# Patient Record
Sex: Female | Born: 1940 | Race: White | Hispanic: No | Marital: Married | Smoking: Never smoker
Health system: Southern US, Community
[De-identification: ages and names within clinical notes are randomized; demographics above are authoritative.]

## PROBLEM LIST (undated history)

## (undated) DIAGNOSIS — I4891 Unspecified atrial fibrillation: Secondary | ICD-10-CM

## (undated) DIAGNOSIS — I341 Nonrheumatic mitral (valve) prolapse: Secondary | ICD-10-CM

## (undated) DIAGNOSIS — R002 Palpitations: Secondary | ICD-10-CM

## (undated) DIAGNOSIS — L659 Nonscarring hair loss, unspecified: Secondary | ICD-10-CM

## (undated) DIAGNOSIS — R569 Unspecified convulsions: Secondary | ICD-10-CM

## (undated) DIAGNOSIS — D649 Anemia, unspecified: Secondary | ICD-10-CM

## (undated) DIAGNOSIS — Z951 Presence of aortocoronary bypass graft: Secondary | ICD-10-CM

## (undated) DIAGNOSIS — T8149XA Infection following a procedure, other surgical site, initial encounter: Secondary | ICD-10-CM

## (undated) DIAGNOSIS — I10 Essential (primary) hypertension: Secondary | ICD-10-CM

## (undated) DIAGNOSIS — I219 Acute myocardial infarction, unspecified: Secondary | ICD-10-CM

## (undated) DIAGNOSIS — I2510 Atherosclerotic heart disease of native coronary artery without angina pectoris: Secondary | ICD-10-CM

## (undated) DIAGNOSIS — K5190 Ulcerative colitis, unspecified, without complications: Secondary | ICD-10-CM

## (undated) DIAGNOSIS — M1990 Unspecified osteoarthritis, unspecified site: Secondary | ICD-10-CM

## (undated) DIAGNOSIS — M26629 Arthralgia of temporomandibular joint, unspecified side: Secondary | ICD-10-CM

## (undated) DIAGNOSIS — A0472 Enterocolitis due to Clostridium difficile, not specified as recurrent: Secondary | ICD-10-CM

## (undated) DIAGNOSIS — K219 Gastro-esophageal reflux disease without esophagitis: Secondary | ICD-10-CM

## (undated) DIAGNOSIS — Z8719 Personal history of other diseases of the digestive system: Secondary | ICD-10-CM

## (undated) DIAGNOSIS — I319 Disease of pericardium, unspecified: Secondary | ICD-10-CM

## (undated) DIAGNOSIS — Z8709 Personal history of other diseases of the respiratory system: Secondary | ICD-10-CM

## (undated) DIAGNOSIS — R490 Dysphonia: Secondary | ICD-10-CM

## (undated) DIAGNOSIS — E785 Hyperlipidemia, unspecified: Secondary | ICD-10-CM

## (undated) DIAGNOSIS — Z933 Colostomy status: Secondary | ICD-10-CM

## (undated) DIAGNOSIS — N189 Chronic kidney disease, unspecified: Secondary | ICD-10-CM

## (undated) HISTORY — PX: TUBAL LIGATION: SHX77

## (undated) HISTORY — DX: Atherosclerotic heart disease of native coronary artery without angina pectoris: I25.10

## (undated) HISTORY — PX: HEMORRHOID SURGERY: SHX153

## (undated) HISTORY — DX: Unspecified atrial fibrillation: I48.91

## (undated) HISTORY — DX: Hyperlipidemia, unspecified: E78.5

## (undated) HISTORY — DX: Arthralgia of temporomandibular joint, unspecified side: M26.629

## (undated) HISTORY — DX: Disease of pericardium, unspecified: I31.9

## (undated) HISTORY — PX: COLONOSCOPY: SHX174

## (undated) HISTORY — PX: CATARACT EXTRACTION, BILATERAL: SHX1313

## (undated) HISTORY — DX: Infection following a procedure, other surgical site, initial encounter: T81.49XA

## (undated) HISTORY — DX: Palpitations: R00.2

## (undated) HISTORY — DX: Presence of aortocoronary bypass graft: Z95.1

## (undated) HISTORY — PX: ABDOMINAL HYSTERECTOMY: SHX81

## (undated) HISTORY — DX: Enterocolitis due to Clostridium difficile, not specified as recurrent: A04.72

## (undated) HISTORY — PX: CORONARY ANGIOPLASTY WITH STENT PLACEMENT: SHX49

## (undated) HISTORY — DX: Acute myocardial infarction, unspecified: I21.9

## (undated) HISTORY — PX: APPENDECTOMY: SHX54

## (undated) HISTORY — DX: Colostomy status: Z93.3

## (undated) HISTORY — PX: ESOPHAGOGASTRODUODENOSCOPY: SHX1529

## (undated) HISTORY — DX: Gastro-esophageal reflux disease without esophagitis: K21.9

## (undated) HISTORY — PX: OTHER SURGICAL HISTORY: SHX169

## (undated) HISTORY — DX: Essential (primary) hypertension: I10

## (undated) HISTORY — DX: Ulcerative colitis, unspecified, without complications: K51.90

## (undated) HISTORY — DX: Nonscarring hair loss, unspecified: L65.9

---

## 1978-05-15 DIAGNOSIS — I319 Disease of pericardium, unspecified: Secondary | ICD-10-CM

## 1978-05-15 HISTORY — DX: Disease of pericardium, unspecified: I31.9

## 2001-05-15 DIAGNOSIS — R002 Palpitations: Secondary | ICD-10-CM

## 2001-05-15 HISTORY — DX: Palpitations: R00.2

## 2002-04-18 ENCOUNTER — Encounter: Payer: Self-pay | Admitting: Cardiology

## 2002-05-27 ENCOUNTER — Encounter: Payer: Self-pay | Admitting: Cardiology

## 2003-05-16 HISTORY — PX: OOPHORECTOMY: SHX86

## 2003-06-02 ENCOUNTER — Encounter: Payer: Self-pay | Admitting: Cardiology

## 2006-07-06 ENCOUNTER — Encounter: Payer: Self-pay | Admitting: Cardiology

## 2006-07-09 ENCOUNTER — Encounter: Payer: Self-pay | Admitting: Cardiology

## 2009-08-02 ENCOUNTER — Ambulatory Visit: Payer: Self-pay | Admitting: Cardiology

## 2009-08-02 DIAGNOSIS — R079 Chest pain, unspecified: Secondary | ICD-10-CM

## 2009-08-02 DIAGNOSIS — E785 Hyperlipidemia, unspecified: Secondary | ICD-10-CM

## 2009-08-02 DIAGNOSIS — I491 Atrial premature depolarization: Secondary | ICD-10-CM

## 2009-08-16 ENCOUNTER — Telehealth: Payer: Self-pay | Admitting: Cardiology

## 2009-08-24 ENCOUNTER — Telehealth (INDEPENDENT_AMBULATORY_CARE_PROVIDER_SITE_OTHER): Payer: Self-pay | Admitting: *Deleted

## 2009-08-25 ENCOUNTER — Ambulatory Visit: Payer: Self-pay | Admitting: Cardiovascular Disease

## 2009-08-25 ENCOUNTER — Encounter (HOSPITAL_COMMUNITY): Admission: RE | Admit: 2009-08-25 | Discharge: 2009-10-14 | Payer: Self-pay | Admitting: Cardiology

## 2009-08-25 ENCOUNTER — Ambulatory Visit: Payer: Self-pay

## 2009-08-25 ENCOUNTER — Ambulatory Visit (HOSPITAL_COMMUNITY): Admission: RE | Admit: 2009-08-25 | Discharge: 2009-08-25 | Payer: Self-pay | Admitting: Cardiovascular Disease

## 2009-08-25 ENCOUNTER — Encounter: Payer: Self-pay | Admitting: Cardiology

## 2010-06-14 NOTE — Progress Notes (Signed)
Summary: should pt keep or cancel appt   Phone Note Call from Patient Call back at Home Phone [phone]   Caller: Patient Reason for Call: Talk to Nurse Summary of Call: pt has a compression fx in her back, echo, stress test on 4/13. should pt cancel or keep.  Initial call taken by: Neil Crouch,  August 16, 2009 4:09 PM  Follow-up for Phone Call        Called patient back..she has informed me that she has a probable compression fracture shown on xray and is waiting for an MRI appointment to confirm. She is scheduled for a stress echo on 4/13 and is concerned. I advised her that having an echo on 4/13 is no problem but she would not be able to walk on the treadmill. Advised her we would discuss this issue with Dr.McLean on 4/5 and call her back. She statea that it is OK to leave message on her voice mail at home.  Follow-up by: J REISS RN     Appended Document: should pt keep or cancel appt  Change myoview to [place].   Appended Document: should pt keep or cancel appt  pt scheduled for stress myoview and echo----pt unable to walk on treadmill due to probable compression fracture in her back-reviewed with Dr Vassie Moment recommended change stress myoview to [employer] discussed with pt and will cancel order for stress myoview and order lexiscan myoview   Clinical Lists Changes  Orders: Added new Referral order of Nuclear Stress Test (Nuc Stress Test) - Signed

## 2010-06-14 NOTE — Consult Note (Signed)
Summary: [facility]   Imported By: Marilynne Drivers 08/20/2009 13:52:29  _____________________________________________________________________  External Attachment:    Type:   Image     Comment:   External Document

## 2010-06-14 NOTE — Progress Notes (Signed)
Summary: Nuclear Pre-Procedure  Phone Note Outgoing Call Call back at [facility] Phone [phone]   Call placed by: Eliezer Lofts, EMT-P,  August 24, 2009 2:26 PM Call placed to: Patient Action Taken: Phone Call Completed Summary of Call: Reviewed information on Myoview Information Sheet (see scanned document for further details).  Spoke with Patient.    Nuclear Med Background Indications for Stress Test: Evaluation for Ischemia, Abnormal EKG   History: Echo, Heart Catheterization, Myocardial Perfusion Study  History Comments: '04 Heart Cath: N/O CAD 11/04 MPS: EF=67%, NL '08 Echo: EF-60-65%  Symptoms: Chest Pain, DOE    Nuclear Pre-Procedure Cardiac Risk Factors: Family History - CAD, Hypertension, Lipids Height (in): 66.5

## 2010-06-14 NOTE — Procedures (Signed)
Summary: [facility]  [facility]   Imported By: Marilynne Drivers 08/20/2009 16:53:50  _____________________________________________________________________  External Attachment:    Type:   Image     Comment:   External Document

## 2010-06-14 NOTE — Consult Note (Signed)
Summary: [facility]  [facility]   Imported By: Marilynne Drivers 08/20/2009 16:43:00  _____________________________________________________________________  External Attachment:    Type:   Image     Comment:   External Document

## 2010-06-14 NOTE — Consult Note (Signed)
Summary: [facility]   Imported By: Marilynne Drivers 08/20/2009 16:49:43  _____________________________________________________________________  External Attachment:    Type:   Image     Comment:   External Document

## 2010-06-14 NOTE — Assessment & Plan Note (Signed)
Summary: Cardiology Nuclear Study  Nuclear Med Background Indications for Stress Test: Evaluation for Ischemia, Abnormal EKG   History: Echo, Heart Catheterization, Myocardial Perfusion Study  History Comments: '04 Heart Cath: N/O CAD 11/04 MPS: EF=67%, NL '08 Echo: EF-60-65%  Symptoms: Chest Pain, DOE    Nuclear Pre-Procedure Cardiac Risk Factors: Family History - CAD, Hypertension, Lipids Caffeine/Decaff Intake: None NPO After: 5:30 PM Lungs: clear IV 0.9% NS with Angio Cath: 20g     IV Site: (L) AC IV Started by: Irven Baltimore RN Chest Size (in) 36     Cup Size D     Height (in): 66.5 Weight (lb): 148 BMI: 23.62  Nuclear Med Study 1 or 2 day study:  1 day     Stress Test Type:  Carlton Adam Reading MD:  Jenkins Rouge, MD     Referring MD:  D.Mclean Resting Radionuclide:  Technetium 60mTetrofosmin     Resting Radionuclide Dose:  11 mCi  Stress Radionuclide:  Technetium 952metrofosmin     Stress Radionuclide Dose:  33 mCi   Stress Protocol   Lexiscan: 0.4 mg   Stress Test Technologist:  SaPerrin MalteseMT-P     Nuclear Technologist:  StCharlton AmorNMT  Rest Procedure  Myocardial perfusion imaging was performed at rest 45 minutes following the intravenous administration of Myoview Technetium 9966mtrofosmin.  Stress Procedure  The patient received IV Lexiscan 0.4 mg over 15-seconds.  Myoview injected at 30-seconds.  There were no significant changes with infusion.  Quantitative spect images were obtained after a 45 minute delay.  QPS Raw Data Images:  Normal; no motion artifact; normal heart/lung ratio. Stress Images:  NI: Uniform and normal uptake of tracer in all myocardial segments. Rest Images:  Normal homogeneous uptake in all areas of the myocardium. Subtraction (SDS):  Normal Transient Ischemic Dilatation:  .97  (Normal <1.22)  Lung/Heart Ratio:  .32  (Normal <0.45)  Quantitative Gated Spect Images QGS EDV:  66 ml QGS ESV:  18 ml QGS EF:  73 % QGS  cine images:  normal  Findings Normal nuclear study      Overall Impression  Exercise Capacity: lexiscan BP Response: Normal blood pressure response. Clinical Symptoms: No chest pain ECG Impression: No significant ST segment change suggestive of ischemia. Overall Impression: Normal stress nuclear study.  Appended Document: Cardiology Nuclear Study normal study  Appended Document: Cardiology Nuclear Study discussed results with patient by telephone

## 2010-06-14 NOTE — Assessment & Plan Note (Signed)
Summary: NP6/ABN EKG/BIGEMY/JML/ PER JEN/JML   Primary Provider:  Tacey Ruiz, MD  CC:  new patient abnormal ekg.  [place].  Pt states she went in to Dr. Olevia Perches office for a pain in her back and all of this was unexpected.  Marland Kitchen  History of Present Illness: 70 yo with history of mild mitral regurgitation and aortic insufficiency, cath with mild CAD in 2004, HTN, and palpitations presents for evaluation of left-sided chest and back pain as well as abnormal ECG at her PCP's office this am.  Patient went to see her PCP because of pain underneath her left shoulder and radiating to underneath her left breast.  The pain is sharp and lasts 5-10 minutes at a time.  It is not associated with exertion and usually happens at rest.  It has been daily starting 3 wks ago, occurring on and off all day since then.  She works out a [employer] without any chest pain.  She gets a little short of breath walking up a hill but no chest pain.  No shortness of breath walking on flat ground.    ECG: Sinus rhythm with atrial bigeminy (PACs)   Labs (3/11): K 3.9, creatinine 0.8  Current Medications (verified): 1)  Lisinopril 10 Mg Tabs (Lisinopril) .... Take One Tablet Once Daily 2)  Simvastatin 40 Mg Tabs (Simvastatin) .... Take One Tablet Once Daily 3)  Nexium 40 Mg Cpdr (Esomeprazole Magnesium) .... Once Daily 4)  Aspirin 81 Mg Tabs (Aspirin) .... Take One Tablet Once Daily 5)  Multivitamins  Tabs (Multiple Vitamin) .... Take One Tablet Once Daily 6)  Calcium-Vitamin D 600-200 Mg-Unit Tabs (Calcium-Vitamin D) .... Take One Tablet Once Daily 7)  Vitamin C 1000 Mg Tabs (Ascorbic Acid) .... Once Daily 8)  Tylenol Extra Strength 500 Mg Tabs (Acetaminophen) .... Take One Tablet Once Daily  Allergies (verified): 1)  ! Corgard 2)  ! Verapamil 3)  ! Sulfa 4)  ! * Shellfish  Past History:  Past Medical History: 1. Hyperlipidemia 2. Pericarditis in the 1980s 3. GERD 4. Valvular disease: Last echo (2/08) with EF  60-65%, trace MR, mild-moderate TR.  Prior echoes had shown mild AI and mild to moderate MR.   5.  Left heart cath in 2004 for atypical chest pain: 40% mid LAD stenosis only.  Right heart cath at the same time showed mean RA pressure 5 mmHg, PA 36/9, mean PCWP 9 mmHg.  Last myoview in 11/04 showed Ef 67%, normal perfusion images.  6.  GERD 7.  HTN 8.  Holter in 2003 showed PACs and PVCs  Family History: Father with MI at 61, mother with CABG in her 66s  Social History: Moved from [place] to [place] to be near her 2 children.  Married.  Retired.  Never smoked.  Rare ETOH.   Review of Systems       All systems reviewed and negative except as per HPI.   Vital Signs:  Patient profile:   70 year old female Height:      66.5 inches Weight:      151 pounds BMI:     24.09 Pulse rate:   76 / minute Pulse rhythm:   regular BP sitting:   132 / 66  (left arm) Cuff size:   large  Vitals Entered By: Doug Sou CMA (August 02, 2009 4:29 PM)  Physical Exam  General:  Well developed, well nourished, in no acute distress. Head:  normocephalic and atraumatic Nose:  no deformity, discharge, inflammation, or  lesions Mouth:  Teeth, gums and palate normal. Oral mucosa normal. Neck:  Neck supple, no JVD. No masses, thyromegaly or abnormal cervical nodes. Lungs:  Clear bilaterally to auscultation and percussion. Heart:  Non-displaced PMI, chest non-tender; regular rate and rhythm, S1, S2 without rubs or gallops. 2/6 HSM at [place].  Carotid upstroke normal, no bruit. Pedals normal pulses. No edema, no varicosities. Abdomen:  Bowel sounds positive; abdomen soft and non-tender without masses, organomegaly, or hernias noted. No hepatosplenomegaly. Msk:  Tender under left scapula Extremities:  No clubbing or cyanosis. Neurologic:  Alert and oriented x 3. Skin:  Intact without lesions or rashes. Psych:  Normal affect.   Impression & Recommendations:  Problem # 1:  CHEST PAIN UNSPECIFIED  (ICD-786.50) Atypical chest pain radiating from left upper back.  This may be musculoskeletal given the tenderness to palpation in left upper back.  Given the persistence of the pain and her known CAD (40% LAD stenosis in 2004), I will have her do an ETT-myoview to rule out ischemia and risk stratify.    Problem # 2:  VALVULAR DISEASE Mild to moderate MR, mild to moderate TR, and mild aortic insufficiency at various times on past echoes.  Exam is suggestive of a TR murmur.  No evidence on exam for volume overload.  Will get an echo to assess valvular function.    Problem # 3:  ATRIAL PREMATURE BEATS (ICD-427.61) Patient's ECG showed atrial bigeminy (atrial premature beats). This is likely benign.  Will assess structure of heart with echo as above.  She drinks 2-3 cups of coffee daily, I have asked her to cut back.    Problem # 4:  HYPERLIPIDEMIA-MIXED (TDH-741.4) Given known mild CAD, would aim to keep LDL ideally < 70, at least < 100.    Other Orders: Echocardiogram (Echo) Nuclear Stress Test (Nuc Stress Test)  Patient Instructions: 1)  Your physician has requested that you have an echocardiogram.  Echocardiography is a painless test that uses sound waves to create images of your heart. It provides your doctor with information about the size and shape of your heart and how well your heart's chambers and valves are working.  This procedure takes approximately one hour. There are no restrictions for this procedure. 2)  Your physician has requested that you have an exercise stress myoview.  For further information please visit [email or web address].  Please follow instruction sheet, as given. 3)  Your physician recommends that you schedule a follow-up appointment with Dr Aundra Dubin  after testing is completed in 3-4 weeks

## 2010-11-13 DIAGNOSIS — I219 Acute myocardial infarction, unspecified: Secondary | ICD-10-CM

## 2010-11-13 HISTORY — DX: Acute myocardial infarction, unspecified: I21.9

## 2010-11-21 ENCOUNTER — Inpatient Hospital Stay (HOSPITAL_COMMUNITY)
Admission: EM | Admit: 2010-11-21 | Discharge: 2010-12-07 | DRG: 234 | Disposition: A | Discharge status: Home Health | Payer: Medicare Other | Source: Other Acute Inpatient Hospital | Attending: Thoracic Surgery (Cardiothoracic Vascular Surgery) | Admitting: Thoracic Surgery (Cardiothoracic Vascular Surgery)

## 2010-11-21 DIAGNOSIS — I6529 Occlusion and stenosis of unspecified carotid artery: Secondary | ICD-10-CM | POA: Diagnosis present

## 2010-11-21 DIAGNOSIS — Z7901 Long term (current) use of anticoagulants: Secondary | ICD-10-CM

## 2010-11-21 DIAGNOSIS — K219 Gastro-esophageal reflux disease without esophagitis: Secondary | ICD-10-CM | POA: Diagnosis present

## 2010-11-21 DIAGNOSIS — A0472 Enterocolitis due to Clostridium difficile, not specified as recurrent: Secondary | ICD-10-CM | POA: Diagnosis not present

## 2010-11-21 DIAGNOSIS — I10 Essential (primary) hypertension: Secondary | ICD-10-CM | POA: Diagnosis present

## 2010-11-21 DIAGNOSIS — E871 Hypo-osmolality and hyponatremia: Secondary | ICD-10-CM | POA: Diagnosis not present

## 2010-11-21 DIAGNOSIS — N390 Urinary tract infection, site not specified: Secondary | ICD-10-CM | POA: Diagnosis present

## 2010-11-21 DIAGNOSIS — E876 Hypokalemia: Secondary | ICD-10-CM | POA: Diagnosis not present

## 2010-11-21 DIAGNOSIS — Z7982 Long term (current) use of aspirin: Secondary | ICD-10-CM

## 2010-11-21 DIAGNOSIS — I200 Unstable angina: Secondary | ICD-10-CM

## 2010-11-21 DIAGNOSIS — Z91013 Allergy to seafood: Secondary | ICD-10-CM

## 2010-11-21 DIAGNOSIS — I519 Heart disease, unspecified: Secondary | ICD-10-CM | POA: Diagnosis not present

## 2010-11-21 DIAGNOSIS — Z888 Allergy status to other drugs, medicaments and biological substances status: Secondary | ICD-10-CM

## 2010-11-21 DIAGNOSIS — Z882 Allergy status to sulfonamides status: Secondary | ICD-10-CM

## 2010-11-21 DIAGNOSIS — I214 Non-ST elevation (NSTEMI) myocardial infarction: Principal | ICD-10-CM | POA: Diagnosis present

## 2010-11-21 DIAGNOSIS — Y9210 Unspecified residential institution as the place of occurrence of the external cause: Secondary | ICD-10-CM | POA: Diagnosis not present

## 2010-11-21 DIAGNOSIS — I2510 Atherosclerotic heart disease of native coronary artery without angina pectoris: Secondary | ICD-10-CM | POA: Diagnosis present

## 2010-11-21 DIAGNOSIS — Z01810 Encounter for preprocedural cardiovascular examination: Secondary | ICD-10-CM

## 2010-11-21 DIAGNOSIS — R32 Unspecified urinary incontinence: Secondary | ICD-10-CM | POA: Diagnosis not present

## 2010-11-21 DIAGNOSIS — D62 Acute posthemorrhagic anemia: Secondary | ICD-10-CM | POA: Diagnosis not present

## 2010-11-21 DIAGNOSIS — Z8249 Family history of ischemic heart disease and other diseases of the circulatory system: Secondary | ICD-10-CM

## 2010-11-21 DIAGNOSIS — E785 Hyperlipidemia, unspecified: Secondary | ICD-10-CM | POA: Diagnosis present

## 2010-11-21 DIAGNOSIS — I4891 Unspecified atrial fibrillation: Secondary | ICD-10-CM | POA: Diagnosis not present

## 2010-11-21 DIAGNOSIS — E8779 Other fluid overload: Secondary | ICD-10-CM | POA: Diagnosis not present

## 2010-11-21 DIAGNOSIS — Y832 Surgical operation with anastomosis, bypass or graft as the cause of abnormal reaction of the patient, or of later complication, without mention of misadventure at the time of the procedure: Secondary | ICD-10-CM | POA: Diagnosis not present

## 2010-11-21 DIAGNOSIS — Z79899 Other long term (current) drug therapy: Secondary | ICD-10-CM

## 2010-11-21 DIAGNOSIS — D696 Thrombocytopenia, unspecified: Secondary | ICD-10-CM | POA: Diagnosis not present

## 2010-11-21 HISTORY — PX: CARDIAC CATHETERIZATION: SHX172

## 2010-11-21 LAB — COMPREHENSIVE METABOLIC PANEL
BUN: 17 mg/dL (ref 6–23)
CO2: 27 mEq/L (ref 19–32)
Calcium: 8.6 mg/dL (ref 8.4–10.5)
Chloride: 106 mEq/L (ref 96–112)
Creatinine, Ser: 0.64 mg/dL (ref 0.50–1.10)
GFR calc Af Amer: >60 mL/min (ref >60)
GFR calc non Af Amer: >60 mL/min (ref >60)
Total Bilirubin: 0.2 mg/dL — ABNORMAL LOW (ref 0.3–1.2)

## 2010-11-21 LAB — PRO B NATRIURETIC PEPTIDE: Pro B Natriuretic peptide (BNP): 716.5 pg/mL — ABNORMAL HIGH (ref 0–125)

## 2010-11-21 LAB — CBC
HCT: 36.1 % (ref 36.0–46.0)
MCH: 28.8 pg (ref 26.0–34.0)
MCV: 86.8 fL (ref 78.0–100.0)
Platelets: 152 10*3/uL (ref 150–400)
RBC: 4.16 MIL/uL (ref 3.87–5.11)
WBC: 6.9 10*3/uL (ref 4.0–10.5)

## 2010-11-21 LAB — CARDIAC PANEL(CRET KIN+CKTOT+MB+TROPI)
CK, MB: 6.7 ng/mL (ref 0.3–4.0)
CK, MB: 10.5 ng/mL (ref 0.3–4.0)
Total CK: 143 U/L (ref 7–177)

## 2010-11-22 DIAGNOSIS — I369 Nonrheumatic tricuspid valve disorder, unspecified: Secondary | ICD-10-CM

## 2010-11-22 DIAGNOSIS — I2510 Atherosclerotic heart disease of native coronary artery without angina pectoris: Secondary | ICD-10-CM

## 2010-11-22 DIAGNOSIS — I214 Non-ST elevation (NSTEMI) myocardial infarction: Secondary | ICD-10-CM

## 2010-11-22 LAB — BASIC METABOLIC PANEL
BUN: 17 mg/dL (ref 6–23)
Calcium: 9 mg/dL (ref 8.4–10.5)
Chloride: 102 mEq/L (ref 96–112)
Creatinine, Ser: 0.78 mg/dL (ref 0.50–1.10)
GFR calc Af Amer: >60 mL/min (ref >60)
GFR calc non Af Amer: >60 mL/min (ref >60)

## 2010-11-22 LAB — URINALYSIS, ROUTINE W REFLEX MICROSCOPIC
Glucose, UA: NEGATIVE mg/dL
Hgb urine dipstick: NEGATIVE
Ketones, ur: NEGATIVE mg/dL
Protein, ur: NEGATIVE mg/dL
Urobilinogen, UA: 0.2 mg/dL (ref 0.0–1.0)

## 2010-11-22 LAB — HEPARIN LEVEL (UNFRACTIONATED)
Heparin Unfractionated: 0.35 IU/mL (ref 0.30–0.70)
Heparin Unfractionated: 0.22 IU/mL — ABNORMAL LOW (ref 0.30–0.70)

## 2010-11-22 LAB — POCT I-STAT 3, ART BLOOD GAS (G3+)
O2 Saturation: 80 %
Patient temperature: 98.5
TCO2: 30 mmol/L (ref 0–100)
pH, Arterial: 7.456 — ABNORMAL HIGH (ref 7.350–7.400)

## 2010-11-22 LAB — CBC
HCT: 36.2 % (ref 36.0–46.0)
MCH: 29 pg (ref 26.0–34.0)
MCHC: 32.9 g/dL (ref 30.0–36.0)
MCV: 88.1 fL (ref 78.0–100.0)
Platelets: 128 10*3/uL — ABNORMAL LOW (ref 150–400)
RDW: 14.2 % (ref 11.5–15.5)

## 2010-11-22 LAB — D-DIMER, QUANTITATIVE: D-Dimer, Quant: 0.29 ug/mL-FEU (ref 0.00–0.48)

## 2010-11-22 LAB — PROTIME-INR
INR: 1.07 (ref 0.00–1.49)
Prothrombin Time: 14.1 seconds (ref 11.6–15.2)

## 2010-11-22 LAB — HEMOGLOBIN A1C: Hgb A1c MFr Bld: 6.3 % — ABNORMAL HIGH (ref <5.7)

## 2010-11-22 LAB — URINE MICROSCOPIC-ADD ON

## 2010-11-22 LAB — LIPID PANEL: Total CHOL/HDL Ratio: 3.4 RATIO
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
## 2010-11-23 ENCOUNTER — Inpatient Hospital Stay (HOSPITAL_COMMUNITY): Payer: Medicare Other

## 2010-11-23 LAB — COMPREHENSIVE METABOLIC PANEL
ALT: 15 U/L (ref 0–35)
BUN: 16 mg/dL (ref 6–23)
CO2: 30 mEq/L (ref 19–32)
Calcium: 9.2 mg/dL (ref 8.4–10.5)
Creatinine, Ser: 0.82 mg/dL (ref 0.50–1.10)
GFR calc Af Amer: >60 mL/min (ref >60)
GFR calc non Af Amer: >60 mL/min (ref >60)
Glucose, Bld: 112 mg/dL — ABNORMAL HIGH (ref 70–99)

## 2010-11-23 LAB — HEPARIN LEVEL (UNFRACTIONATED): Heparin Unfractionated: 0.46 IU/mL (ref 0.30–0.70)

## 2010-11-23 LAB — TYPE AND SCREEN: ABO/RH(D): A POS

## 2010-11-23 LAB — CBC
MCHC: 32.7 g/dL (ref 30.0–36.0)
Platelets: 129 10*3/uL — ABNORMAL LOW (ref 150–400)
RDW: 14 % (ref 11.5–15.5)

## 2010-11-24 LAB — BASIC METABOLIC PANEL
CO2: 31 mEq/L (ref 19–32)
Chloride: 100 mEq/L (ref 96–112)
GFR calc Af Amer: >60 mL/min (ref >60)
Potassium: 3.5 mEq/L (ref 3.5–5.1)
Sodium: 139 mEq/L (ref 135–145)

## 2010-11-24 LAB — BLOOD GAS, ARTERIAL
O2 Saturation: 99.4 %
Patient temperature: 98.6
pH, Arterial: 7.565 — ABNORMAL HIGH (ref 7.350–7.400)

## 2010-11-24 LAB — HEPARIN LEVEL (UNFRACTIONATED): Heparin Unfractionated: 0.27 IU/mL — ABNORMAL LOW (ref 0.30–0.70)

## 2010-11-24 LAB — SURGICAL PCR SCREEN
MRSA, PCR: NEGATIVE
Staphylococcus aureus: NEGATIVE

## 2010-11-24 LAB — CBC
MCH: 28.4 pg (ref 26.0–34.0)
Platelets: 148 10*3/uL — ABNORMAL LOW (ref 150–400)
RBC: 4.51 MIL/uL (ref 3.87–5.11)
WBC: 5.3 10*3/uL (ref 4.0–10.5)

## 2010-11-25 ENCOUNTER — Other Ambulatory Visit: Payer: Self-pay | Admitting: Thoracic Surgery (Cardiothoracic Vascular Surgery)

## 2010-11-25 ENCOUNTER — Inpatient Hospital Stay (HOSPITAL_COMMUNITY): Payer: Medicare Other

## 2010-11-25 DIAGNOSIS — Z951 Presence of aortocoronary bypass graft: Secondary | ICD-10-CM | POA: Insufficient documentation

## 2010-11-25 DIAGNOSIS — I2510 Atherosclerotic heart disease of native coronary artery without angina pectoris: Secondary | ICD-10-CM

## 2010-11-25 HISTORY — DX: Presence of aortocoronary bypass graft: Z95.1

## 2010-11-25 HISTORY — PX: CORONARY ARTERY BYPASS GRAFT: SHX141

## 2010-11-25 LAB — POCT I-STAT, CHEM 8
HCT: 26 % — ABNORMAL LOW (ref 36.0–46.0)
Hemoglobin: 8.8 g/dL — ABNORMAL LOW (ref 12.0–15.0)
Sodium: 141 mEq/L (ref 135–145)
TCO2: 25 mmol/L (ref 0–100)

## 2010-11-25 LAB — CBC
HCT: 28.7 % — ABNORMAL LOW (ref 36.0–46.0)
HCT: 26.8 % — ABNORMAL LOW (ref 36.0–46.0)
Hemoglobin: 13.2 g/dL (ref 12.0–15.0)
Hemoglobin: 9.7 g/dL — ABNORMAL LOW (ref 12.0–15.0)
MCH: 28.8 pg (ref 26.0–34.0)
MCH: 28.8 pg (ref 26.0–34.0)
MCH: 28.9 pg (ref 26.0–34.0)
MCHC: 33.4 g/dL (ref 30.0–36.0)
MCHC: 33.8 g/dL (ref 30.0–36.0)
MCV: 86.2 fL (ref 78.0–100.0)
MCV: 86.2 fL (ref 78.0–100.0)
Platelets: 161 10*3/uL (ref 150–400)
Platelets: 75 10*3/uL — ABNORMAL LOW (ref 150–400)
RBC: 4.58 MIL/uL (ref 3.87–5.11)
RBC: 3.11 MIL/uL — ABNORMAL LOW (ref 3.87–5.11)
RDW: 13.7 % (ref 11.5–15.5)
WBC: 5.7 10*3/uL (ref 4.0–10.5)

## 2010-11-25 LAB — POCT I-STAT 4, (NA,K, GLUC, HGB,HCT)
Glucose, Bld: 115 mg/dL — ABNORMAL HIGH (ref 70–99)
Glucose, Bld: 120 mg/dL — ABNORMAL HIGH (ref 70–99)
Glucose, Bld: 139 mg/dL — ABNORMAL HIGH (ref 70–99)
HCT: 34 % — ABNORMAL LOW (ref 36.0–46.0)
HCT: 25 % — ABNORMAL LOW (ref 36.0–46.0)
HCT: 26 % — ABNORMAL LOW (ref 36.0–46.0)
HCT: 29 % — ABNORMAL LOW (ref 36.0–46.0)
Hemoglobin: 11.6 g/dL — ABNORMAL LOW (ref 12.0–15.0)
Hemoglobin: 8.5 g/dL — ABNORMAL LOW (ref 12.0–15.0)
Hemoglobin: 8.8 g/dL — ABNORMAL LOW (ref 12.0–15.0)
Hemoglobin: 9.2 g/dL — ABNORMAL LOW (ref 12.0–15.0)
Potassium: 3.6 mEq/L (ref 3.5–5.1)
Potassium: 4.5 mEq/L (ref 3.5–5.1)
Potassium: 3.7 mEq/L (ref 3.5–5.1)
Potassium: 3.1 mEq/L — ABNORMAL LOW (ref 3.5–5.1)
Potassium: 4.3 mEq/L (ref 3.5–5.1)
Sodium: 136 mEq/L (ref 135–145)
Sodium: 139 mEq/L (ref 135–145)
Sodium: 144 mEq/L (ref 135–145)

## 2010-11-25 LAB — POCT I-STAT 3, ART BLOOD GAS (G3+)
Acid-Base Excess: 4 mmol/L — ABNORMAL HIGH (ref 0.0–2.0)
Bicarbonate: 27.5 mEq/L — ABNORMAL HIGH (ref 20.0–24.0)
O2 Saturation: 100 %
O2 Saturation: 99 %
Patient temperature: 35.7
Patient temperature: 37
TCO2: 28 mmol/L (ref 0–100)
pCO2 arterial: 31.8 mmHg — ABNORMAL LOW (ref 35.0–45.0)
pH, Arterial: 7.545 — ABNORMAL HIGH (ref 7.350–7.400)
pH, Arterial: 7.338 — ABNORMAL LOW (ref 7.350–7.400)
pO2, Arterial: 322 mmHg — ABNORMAL HIGH (ref 80.0–100.0)
pO2, Arterial: 110 mmHg — ABNORMAL HIGH (ref 80.0–100.0)

## 2010-11-25 LAB — GLUCOSE, CAPILLARY
Glucose-Capillary: 120 mg/dL — ABNORMAL HIGH (ref 70–99)
Glucose-Capillary: 98 mg/dL (ref 70–99)
Glucose-Capillary: 113 mg/dL — ABNORMAL HIGH (ref 70–99)
Glucose-Capillary: 144 mg/dL — ABNORMAL HIGH (ref 70–99)

## 2010-11-25 LAB — POCT I-STAT GLUCOSE
Glucose, Bld: 139 mg/dL — ABNORMAL HIGH (ref 70–99)
Operator id: 235871

## 2010-11-25 LAB — BASIC METABOLIC PANEL
CO2: 31 mEq/L (ref 19–32)
Calcium: 9.7 mg/dL (ref 8.4–10.5)
Creatinine, Ser: 0.87 mg/dL (ref 0.50–1.10)
GFR calc non Af Amer: >60 mL/min (ref >60)
Glucose, Bld: 113 mg/dL — ABNORMAL HIGH (ref 70–99)
Sodium: 136 mEq/L (ref 135–145)

## 2010-11-25 LAB — MAGNESIUM: Magnesium: 2.9 mg/dL — ABNORMAL HIGH (ref 1.5–2.5)

## 2010-11-25 LAB — PROTIME-INR: Prothrombin Time: 17.9 seconds — ABNORMAL HIGH (ref 11.6–15.2)

## 2010-11-25 LAB — HEMOGLOBIN AND HEMATOCRIT, BLOOD: HCT: 26 % — ABNORMAL LOW (ref 36.0–46.0)

## 2010-11-25 LAB — CREATININE, SERUM: GFR calc Af Amer: >60 mL/min (ref >60)

## 2010-11-26 ENCOUNTER — Inpatient Hospital Stay (HOSPITAL_COMMUNITY): Payer: Medicare Other

## 2010-11-26 LAB — CBC
HCT: 29.7 % — ABNORMAL LOW (ref 36.0–46.0)
MCH: 28.4 pg (ref 26.0–34.0)
MCV: 87.4 fL (ref 78.0–100.0)
MCV: 88.7 fL (ref 78.0–100.0)
Platelets: 75 10*3/uL — ABNORMAL LOW (ref 150–400)
RBC: 3.18 MIL/uL — ABNORMAL LOW (ref 3.87–5.11)
RBC: 3.35 MIL/uL — ABNORMAL LOW (ref 3.87–5.11)
RDW: 14.1 % (ref 11.5–15.5)
RDW: 14.2 % (ref 11.5–15.5)
WBC: 6.2 10*3/uL (ref 4.0–10.5)
WBC: 7 10*3/uL (ref 4.0–10.5)

## 2010-11-26 LAB — GLUCOSE, CAPILLARY
Glucose-Capillary: 135 mg/dL — ABNORMAL HIGH (ref 70–99)
Glucose-Capillary: 121 mg/dL — ABNORMAL HIGH (ref 70–99)
Glucose-Capillary: 116 mg/dL — ABNORMAL HIGH (ref 70–99)
Glucose-Capillary: 123 mg/dL — ABNORMAL HIGH (ref 70–99)
Glucose-Capillary: 142 mg/dL — ABNORMAL HIGH (ref 70–99)

## 2010-11-26 LAB — POCT I-STAT, CHEM 8
BUN: 14 mg/dL (ref 6–23)
Chloride: 103 mEq/L (ref 96–112)
HCT: 29 % — ABNORMAL LOW (ref 36.0–46.0)
Potassium: 4.4 mEq/L (ref 3.5–5.1)

## 2010-11-26 LAB — BASIC METABOLIC PANEL
CO2: 25 mEq/L (ref 19–32)
Chloride: 107 mEq/L (ref 96–112)
Creatinine, Ser: 0.59 mg/dL (ref 0.50–1.10)
GFR calc Af Amer: >60 mL/min (ref >60)
Sodium: 139 mEq/L (ref 135–145)

## 2010-11-26 LAB — MAGNESIUM
Magnesium: 2.5 mg/dL (ref 1.5–2.5)
Magnesium: 2.4 mg/dL (ref 1.5–2.5)

## 2010-11-26 LAB — CREATININE, SERUM: GFR calc Af Amer: >60 mL/min (ref >60)

## 2010-11-27 ENCOUNTER — Inpatient Hospital Stay (HOSPITAL_COMMUNITY): Payer: Medicare Other

## 2010-11-27 LAB — COMPREHENSIVE METABOLIC PANEL
ALT: 17 U/L (ref 0–35)
AST: 20 U/L (ref 0–37)
Albumin: 2.5 g/dL — ABNORMAL LOW (ref 3.5–5.2)
Alkaline Phosphatase: 64 U/L (ref 39–117)
Chloride: 98 mEq/L (ref 96–112)
Potassium: 3.9 mEq/L (ref 3.5–5.1)
Sodium: 133 mEq/L — ABNORMAL LOW (ref 135–145)
Total Bilirubin: 0.7 mg/dL (ref 0.3–1.2)

## 2010-11-27 LAB — CBC
Hemoglobin: 9.4 g/dL — ABNORMAL LOW (ref 12.0–15.0)
MCV: 89 fL (ref 78.0–100.0)
Platelets: 97 10*3/uL — ABNORMAL LOW (ref 150–400)
Platelets: 153 10*3/uL (ref 150–400)
RBC: 3.28 MIL/uL — ABNORMAL LOW (ref 3.87–5.11)
RBC: 3.38 MIL/uL — ABNORMAL LOW (ref 3.87–5.11)
RDW: 14.5 % (ref 11.5–15.5)
WBC: 4.8 10*3/uL (ref 4.0–10.5)
WBC: 4.8 10*3/uL (ref 4.0–10.5)

## 2010-11-27 LAB — BASIC METABOLIC PANEL
CO2: 27 mEq/L (ref 19–32)
Chloride: 103 mEq/L (ref 96–112)
Glucose, Bld: 101 mg/dL — ABNORMAL HIGH (ref 70–99)
Sodium: 137 mEq/L (ref 135–145)

## 2010-11-27 LAB — GLUCOSE, CAPILLARY
Glucose-Capillary: 117 mg/dL — ABNORMAL HIGH (ref 70–99)
Glucose-Capillary: 121 mg/dL — ABNORMAL HIGH (ref 70–99)
Glucose-Capillary: 132 mg/dL — ABNORMAL HIGH (ref 70–99)

## 2010-11-27 LAB — LIPASE, BLOOD: Lipase: 10 U/L — ABNORMAL LOW (ref 11–59)

## 2010-11-28 ENCOUNTER — Inpatient Hospital Stay (HOSPITAL_COMMUNITY): Payer: Medicare Other

## 2010-11-28 LAB — CBC
HCT: 29.5 % — ABNORMAL LOW (ref 36.0–46.0)
Hemoglobin: 9.8 g/dL — ABNORMAL LOW (ref 12.0–15.0)
MCHC: 33.2 g/dL (ref 30.0–36.0)
MCV: 86 fL (ref 78.0–100.0)

## 2010-11-28 LAB — BASIC METABOLIC PANEL
BUN: 26 mg/dL — ABNORMAL HIGH (ref 6–23)
CO2: 22 mEq/L (ref 19–32)
Chloride: 102 mEq/L (ref 96–112)
GFR calc Af Amer: >60 mL/min (ref >60)
Glucose, Bld: 157 mg/dL — ABNORMAL HIGH (ref 70–99)
Potassium: 4.9 mEq/L (ref 3.5–5.1)

## 2010-11-28 LAB — CLOSTRIDIUM DIFFICILE BY PCR: Toxigenic C. Difficile by PCR: POSITIVE — AB

## 2010-11-28 LAB — GLUCOSE, CAPILLARY: Glucose-Capillary: 163 mg/dL — ABNORMAL HIGH (ref 70–99)

## 2010-11-29 ENCOUNTER — Inpatient Hospital Stay (HOSPITAL_COMMUNITY): Payer: Medicare Other

## 2010-11-29 LAB — COMPREHENSIVE METABOLIC PANEL
ALT: 24 U/L (ref 0–35)
Alkaline Phosphatase: 65 U/L (ref 39–117)
CO2: 21 mEq/L (ref 19–32)
GFR calc Af Amer: >60 mL/min (ref >60)
GFR calc non Af Amer: >60 mL/min (ref >60)
Glucose, Bld: 111 mg/dL — ABNORMAL HIGH (ref 70–99)
Potassium: 4.8 mEq/L (ref 3.5–5.1)
Sodium: 128 mEq/L — ABNORMAL LOW (ref 135–145)

## 2010-11-29 LAB — CBC
Hemoglobin: 10 g/dL — ABNORMAL LOW (ref 12.0–15.0)
MCH: 28.3 pg (ref 26.0–34.0)
RBC: 3.53 MIL/uL — ABNORMAL LOW (ref 3.87–5.11)

## 2010-11-29 NOTE — Op Note (Signed)
NAMEJOHNI, NARINE             ACCOUNT NO.:  [id number]  MEDICAL RECORD NO.:  [id number]  LOCATION:  2311                         FACILITY:  [facility]  PHYSICIAN:  Valentina Gu. Roxy Manns, M.D. DATE OF BIRTH:  06-18-1940  DATE OF PROCEDURE:  11/25/2010 DATE OF DISCHARGE:                              OPERATIVE REPORT   PREOPERATIVE DIAGNOSIS:  Severe three-vessel coronary artery disease.  POSTOPERATIVE DIAGNOSIS:  Severe three-vessel coronary artery disease.  PROCEDURE:  Median sternotomy for coronary artery bypass grafting x3 and coronary artery endarterectomy x1 (left internal mammary artery to distal left anterior descending coronary artery, saphenous vein graft to posterior descending coronary artery, saphenous vein graft to first obtuse marginal branch of the left circumflex coronary artery with coronary endarterectomy, endoscopic saphenous vein harvest from right thigh).  SURGEON:  Valentina Gu. Roxy Manns, MD  ASSISTANT:  Suzzanne Cloud, PA  ANESTHESIA:  Glynda Jaeger, MD  BRIEF CLINICAL NOTE:  The patient is a 70 year old female with no previous history of coronary artery disease but risk factors notable for history of hypertension and hyperlipidemia.  The patient presents with unstable angina and borderline positive cardiac enzymes.  Cardiac catheterization demonstrates severe three-vessel coronary artery disease with normal left ventricular function.  A full consultation note has been dictated previously.  The patient and her family have been counseled at length regarding the indications, risks, and potential benefits of surgery.  Alternative treatment strategies have been discussed.  They understand and accept all associated risks of surgery and desire to proceed as described.  OPERATIVE FINDINGS: 1. Normal left ventricular function. 2. Good-quality left internal mammary artery and saphenous vein     conduit for grafting. 3. Good-quality target vessels with exception of the  first obtuse     marginal branch of the left circumflex coronary artery which     required coronary endarterectomy due to diffuse plaque.  OPERATIVE PROCEDURE IN DETAIL:  The patient was brought to the operating room on the above-mentioned date and central monitoring was established by the Anesthesia Team under the care and direction of Dr. Roberts Gaudy. Specifically, a Swan-Ganz catheter was placed through the right internal jugular approach.  A radial arterial line was placed.  Intravenous antibiotics were administered.  The patient was placed in the supine position on the operating table.  General endotracheal anesthesia was induced uneventfully.  The patient's chest, abdomen, both groins, and both lower extremities were prepared and draped in sterile manner. Baseline transesophageal echocardiogram was performed by Dr. Linna Caprice. This demonstrated normal left ventricular function.  No other abnormalities of any significance were noted.  A median sternotomy incision was performed and the left internal mammary artery was dissected from the chest wall and prepared for bypass grafting.  The left internal mammary artery was slightly small caliber but otherwise good-quality conduit.  Following systemic heparinization, the internal mammary artery was transected distally and noted to have excellent flow.  Simultaneously, the patient's saphenous vein was removed from the right thigh using endoscopic vein harvest technique. The saphenous vein was good-quality conduit.  After the saphenous vein has been removed, the small incision in the right lower extremity was closed with absorbable suture.  The patient was heparinized systemically.  The pericardium was opened. The ascending aorta was normal in appearance.  The ascending aorta and the right atrium were cannulated for cardiopulmonary bypass. Cardiopulmonary bypass was begun and the surface of the heart was inspected.  Distal target vessels  were selected for coronary bypass grafting.  A temperature probe was placed in the left ventricular septum and a cardioplegia cannula was placed in the ascending aorta.  The aortic crossclamp was applied and cold blood cardioplegia was delivered in antegrade fashion through the aortic root.  The patient was allowed to cool passively to 32 degrees systemic temperature.  The initial cardioplegic arrest was rapid with early diastolic arrest.  Iced saline slush was applied for topical hypothermia.  Repeat doses of cardioplegia were administered intermittently throughout the crossclamp portion of the operation through the aortic root and down subsequently placed vein graft to maintain completely flat electrocardiogram and septal myocardial temperature below 15 degrees centigrade.  The following distal coronary anastomoses were performed: 1. The poster descending coronary artery was grafted with the     saphenous vein graft in an end-to-side fashion.  This vessel     measured 1.8 mm in diameter and was a good-quality target vessel at     the site of distal grafting. 2. The first obtuse marginal branch of the left circumflex coronary     artery was grafted with saphenous vein graft in an end-to-side     fashion.  This vessel was the first of medial subbranch of a large     trifurcating obtuse marginal system.  It was somewhat diffusely     diseased and upon arteriotomy there was large posterior plaque     which was split requiring endarterectomy.  The endarterectomy was     performed using spatulation with eversion proximally.  The vessel     was normal distally. 3. The distal left anterior descending coronary artery was grafted     with a left internal mammary artery in an end-to-side fashion.     This vessel measured 1.5 mm in diameter and was good-quality at the     site of distal grafting, although the site of distal grafting was     quite far distal towards the apex of the heart to reach  beyond the     high-grade stenosis immediately proximal to this area.  Both proximal saphenous vein anastomoses were performed directly through the ascending aorta prior to removal of the aortic crossclamp.  The left ventricular septal temperature rose rapidly with reperfusion of the left internal mammary artery.  The aortic crossclamp was removed after a total crossclamp time of 63 minutes.  The heart began to beat spontaneously without need for cardioversion.  All proximal and distal coronary anastomoses were inspected for hemostasis and appropriate graft orientation.  Epicardial pacing wires were fixed to the right ventricular free wall into the right atrial appendage.  The patient was rewarmed to 37 degrees centigrade temperature.  The patient was weaned from cardiopulmonary bypass without difficulty.  The patient's rhythm at separation from bypass was AV paced.  Total cardiopulmonary bypass time for the operation was 78 minutes.  Follow-up transesophageal echocardiogram performed by Dr. Linna Caprice after separation from bypass demonstrated normal left ventricular function. The venous and arterial cannulae were removed uneventfully.  Protamine was administered to reverse the anticoagulation.  The mediastinum and the left pleural space were irrigated with saline solution.  Meticulous surgical hemostasis was ascertained.  The mediastinum and the left pleural space were drained with three chest  tubes placed through separate stab incisions inferiorly.  The pericardium and soft tissues anterior to the aorta were reapproximated loosely.  The sternum was closed using double-strength sternal wire.  The soft tissues anterior to the sternum were closed in multiple layers and the skin was closed with a running subcuticular skin closure.  The patient tolerated the procedure well and was transported to the surgical intensive care unit in stable condition.  There were no intraoperative  complications.  All sponge, instrument and needle counts were verified correct at the completion of the operation.     Valentina Gu. Roxy Manns, M.D.     CHO/MEDQ  D:  11/25/2010  T:  11/26/2010  Job:  [id number]  cc:   Shaune Pascal. Bensimhon, MD Loralie Champagne, MD  Electronically Signed by Darylene Price M.D. on 11/29/2010 02:57:30 PM

## 2010-11-30 LAB — URINALYSIS, ROUTINE W REFLEX MICROSCOPIC
Glucose, UA: NEGATIVE mg/dL
Specific Gravity, Urine: 1.021 (ref 1.005–1.030)
pH: 5.5 (ref 5.0–8.0)

## 2010-11-30 LAB — BASIC METABOLIC PANEL
GFR calc Af Amer: >60 mL/min (ref >60)
GFR calc non Af Amer: 53 mL/min — ABNORMAL LOW (ref >60)
Potassium: 4.3 mEq/L (ref 3.5–5.1)
Sodium: 130 mEq/L — ABNORMAL LOW (ref 135–145)

## 2010-11-30 LAB — URINE MICROSCOPIC-ADD ON

## 2010-11-30 LAB — CBC
MCHC: 33 g/dL (ref 30.0–36.0)
RDW: 15 % (ref 11.5–15.5)

## 2010-12-01 DIAGNOSIS — I4891 Unspecified atrial fibrillation: Secondary | ICD-10-CM

## 2010-12-01 LAB — BASIC METABOLIC PANEL
BUN: 22 mg/dL (ref 6–23)
Chloride: 106 mEq/L (ref 96–112)
Creatinine, Ser: 0.75 mg/dL (ref 0.50–1.10)
GFR calc Af Amer: >60 mL/min (ref >60)

## 2010-12-01 LAB — URINE CULTURE: Colony Count: NO GROWTH

## 2010-12-02 LAB — PROTIME-INR
INR: 1.35 (ref 0.00–1.49)
Prothrombin Time: 16.9 seconds — ABNORMAL HIGH (ref 11.6–15.2)

## 2010-12-03 ENCOUNTER — Inpatient Hospital Stay (HOSPITAL_COMMUNITY): Payer: Medicare Other

## 2010-12-03 LAB — CBC
Hemoglobin: 11.2 g/dL — ABNORMAL LOW (ref 12.0–15.0)
MCHC: 34.1 g/dL (ref 30.0–36.0)
Platelets: 178 10*3/uL (ref 150–400)
RDW: 15.9 % — ABNORMAL HIGH (ref 11.5–15.5)

## 2010-12-03 LAB — BASIC METABOLIC PANEL
Calcium: 7.2 mg/dL — ABNORMAL LOW (ref 8.4–10.5)
Chloride: 98 mEq/L (ref 96–112)
Creatinine, Ser: 0.84 mg/dL (ref 0.50–1.10)
GFR calc Af Amer: >60 mL/min (ref >60)
GFR calc non Af Amer: >60 mL/min (ref >60)

## 2010-12-03 LAB — MAGNESIUM: Magnesium: 2.3 mg/dL (ref 1.5–2.5)

## 2010-12-03 LAB — PROTIME-INR
INR: 1.36 (ref 0.00–1.49)
Prothrombin Time: 17 seconds — ABNORMAL HIGH (ref 11.6–15.2)

## 2010-12-04 LAB — PROTIME-INR
INR: 1.35 (ref 0.00–1.49)
Prothrombin Time: 16.9 seconds — ABNORMAL HIGH (ref 11.6–15.2)

## 2010-12-04 LAB — BASIC METABOLIC PANEL
Calcium: 7.1 mg/dL — ABNORMAL LOW (ref 8.4–10.5)
Creatinine, Ser: 0.75 mg/dL (ref 0.50–1.10)
GFR calc Af Amer: >60 mL/min (ref >60)

## 2010-12-04 LAB — CBC
MCHC: 33.2 g/dL (ref 30.0–36.0)
Platelets: 146 10*3/uL — ABNORMAL LOW (ref 150–400)
RDW: 15.9 % — ABNORMAL HIGH (ref 11.5–15.5)

## 2010-12-05 LAB — PROTIME-INR: Prothrombin Time: 19.5 seconds — ABNORMAL HIGH (ref 11.6–15.2)

## 2010-12-06 LAB — HEPATIC FUNCTION PANEL
AST: 58 U/L — ABNORMAL HIGH (ref 0–37)
Alkaline Phosphatase: 236 U/L — ABNORMAL HIGH (ref 39–117)
Bilirubin, Direct: <0.1 mg/dL (ref 0.0–0.3)
Total Bilirubin: 0.2 mg/dL — ABNORMAL LOW (ref 0.3–1.2)

## 2010-12-06 LAB — CBC
HCT: 31.4 % — ABNORMAL LOW (ref 36.0–46.0)
Hemoglobin: 10.5 g/dL — ABNORMAL LOW (ref 12.0–15.0)
MCHC: 33.4 g/dL (ref 30.0–36.0)
MCV: 83.5 fL (ref 78.0–100.0)
RDW: 16.6 % — ABNORMAL HIGH (ref 11.5–15.5)

## 2010-12-06 LAB — BASIC METABOLIC PANEL
BUN: 11 mg/dL (ref 6–23)
Calcium: 7 mg/dL — ABNORMAL LOW (ref 8.4–10.5)
GFR calc Af Amer: >60 mL/min (ref >60)
GFR calc non Af Amer: >60 mL/min (ref >60)
Potassium: 3.9 mEq/L (ref 3.5–5.1)
Sodium: 130 mEq/L — ABNORMAL LOW (ref 135–145)

## 2010-12-12 NOTE — Op Note (Signed)
NAMEMETTA, KORANDA NO.:  [id number]  MEDICAL RECORD NO.:  [id number]  LOCATION:  2311                         FACILITY:  [facility]  PHYSICIAN:  Glynda Jaeger, M.D.  DATE OF BIRTH:  03-11-1941  DATE OF PROCEDURE:  11/25/2010 DATE OF DISCHARGE:                              OPERATIVE REPORT   PROCEDURE:  Intraoperative transesophageal echocardiography  Ms. [name] is a 70 year old female with no previous history of coronary artery disease who was noted to develop unstable angina with a borderline positive acute non-ST-segment elevation myocardial infarction.  Cardiac catheterization revealed 50-80% stenosis in the proximal and midportion of the left anterior descending coronary artery and 80% lesion in the distal left anterior descending.  The left circumflex artery had a high-grade proximal stenosis of 80% and 100% occlusion of the right coronary artery.  Ejection fraction was estimated at 55%.  She is now scheduled to undergo coronary artery bypass grafting by Dr. Roxy Manns.  Intraoperative transesophageal echocardiography was performed to evaluate the left and right ventricular function and to serve as a monitor for intraoperative volume status and to determine if any valvular pathology was present.  The patient was brought to the operating room at [facility] and general anesthesia was induced without difficulty.  Following endotracheal intubation and orogastric suctioning, the transesophageal echocardiography probe was then inserted into the esophagus without difficulty.  IMPRESSION:  Prebypass findings: 1. Aortic valve:  The aortic valve was trileaflet.  The leaflets     opened normally.  There was no aortic insufficiency.  There was no     calcification of the leaflets.  There was, however, mild thickening     of the leaflets. 2. Mitral valve:  The mitral leaflets were thin and pliable, coapted     well with trace mitral insufficiency.   There were no prolapsing     segments.  There were no flail segments noted.  There was mild     mitral annular calcification noted. 3. Left ventricle:  There was vigorous LV contractility in all     segments interrogated.  The ejection fraction was 60-65%.  Left     ventricular end-diastolic diameter measured 4.68 cm.  Left     ventricular wall thickness measured 0.8-0.9 cm, end diastole at the     mid papillary level of the anterior and posterior walls.  There was     no thrombus noted in the left ventricular apex. 4. Right ventricle:  There was normal right ventricular size and     normal contractility of the right ventricular free wall and normal-     appearing right ventricular function. 5. Tricuspid valve:  The tricuspid valve was structurally intact.     There was trace tricuspid insufficiency. 6. Interatrial septum:  There was  lipomatous hypertrophy of the     interatrial septum noted, but no atrial septal defect or patent     foramen ovale by color Doppler or bubble study. 7. Left atrium:  There was no thrombus noted in the left atrium or     left atrial appendage. 8. Descending aorta:  The ascending aorta showed a well-defined     sinotubular ridge and  aortic root which was not aneurysmal.  There     was mild thickening of the proximal aorta, but no significant     atheromatous disease that could be appreciated.  Postbypass findings: 1. Aortic valve:  The aortic valve was unchanged from the prebypass     study.  There was no aortic insufficiency.  The leaflets opened     normally. 2. Mitral valve:  There was again trace mitral insufficiency.     Leaflets coapted well and were structurally intact without prolapse     or fluttering or flail segments. 3. Left ventricle:  There was again vigorous contractility of the left     ventricle with ejection fraction estimated at 60%. 4. Right ventral ventricle:  There was normal-appearing right     ventricular function with good  contractility of the right     ventricular free wall. 5. Tricuspid valve:  There was again trace tricuspid insufficiency     which appeared unchanged from the prebypass study.          ______________________________ Glynda Jaeger, M.D.     DCJ/MEDQ  D:  11/25/2010  T:  11/26/2010  Job:  [id number]  Electronically Signed by Roberts Gaudy M.D. on 12/12/2010 11:33:30 AM

## 2010-12-13 ENCOUNTER — Telehealth: Payer: Self-pay | Admitting: Cardiology

## 2010-12-13 NOTE — Discharge Summary (Signed)
NAMEINDEA, DEARMAN             ACCOUNT NO.:  [id number]  MEDICAL RECORD NO.:  [id number]  LOCATION:  2009                         FACILITY:  [facility]  PHYSICIAN:  Valentina Gu. Roxy Manns, M.D. 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  At that time, she had a 40% LAD lesion with otherwise normal coronary arteries.  She was treated medically.  She was referred to Dr. Aundra Dubin in March 2011 for chest pain after an atrial bigeminy finding on her EKG.  Given her history of mild valvular disease, an echo was repeated.  This showed an ejection fraction of 55-60% with mild mitral regurgitation.  She underwent a Myoview which was reportedly normal but results were not available at the time of admission.  She states that since mid June of this year, she has been having increasing exertional chest pain while exercising at the [facility] facility.  This was particularly worse in the past few days prior to admission and she says it made her stop exercise prematurely.  She did not seek attention for this.  On the date of admission, she ate an apple and developed an allergic-like reaction.  She presented to the [facility] emergency room and was treated with Benadryl and Solu-Medrol and felt as though her throat tightness was improved.  However, in the emergency department, she developed severe chest pain radiating to her left arm. EKG showed nonspecific ST scooping and initial cardiac markers were negative with a total CK of 65 and a troponin of 0.07.  She was felt to require transfer to [facility] for further evaluation and treatment with management by the [facility] Cardiology  Service.  She was started on heparin, intravenous nitroglycerin, and morphine.  Her pain improved but she continued to have 7/10 chest pain.  She was then treated with increased nitroglycerin and Dilaudid and on presentation upon transfer, she was chest pain-free although did continue to appear anxious.  There was no significant change on transfer EKG.  Blood pressures were stable. She was admitted for further evaluation and treatment concerning for unstable angina.  Plan was to continue on aspirin, heparin, nitroglycerin, and beta-blockade as well as continue to monitor cardiac markers and plan cardiac catheterization.  PAST MEDICAL HISTORY: 1. Nonobstructive coronary artery disease as described above. 2. Mild valvular disease. 3. Hypertension. 4. Hyperlipidemia.  Recently stopped Pravastatin due to intolerance. 5. History of pericarditis in the 1980s. 6. History of gastroesophageal reflux disease. 7. History of palpitations with Holter monitor in 2003 which showed     PACs and PVCs.  MEDICATIONS ON ADMISSION: 1. Clonidine 0.1 mg patch q. weekly. 2. Hydrochlorothiazide 25 mg p.o. daily. 3. Nexium 40 mg daily. 4. Vitamins daily.  ALLERGIES: 1. CORGARD. 2. VERAPAMIL. 3. SULFA. 4. SHELLFISH.  She did not have a problem with her cardiac catheterization in 2004.  SOCIAL HISTORY:  She is previously from [place].  She lives in  [place].  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  She denies tobacco use.  She is married.  FAMILY HISTORY:  Father died of a myocardial infarction at age 64. Mother died at age 68.  She did have coronary artery disease with bypass surgery in the 1970s.  PHYSICAL EXAMINATION:  Please see the history and physical dictated at time of admission.  HOSPITAL COURSE:  The patient was admitted in transfer as stated.  She was seen in Cardiology consultation by Dr. Haroldine Laws.  She was felt to require and underwent cardiac catheterization on November 21, 2010.   She was found to have severe three-vessel coronary artery disease with normal ejection fraction of 55% with some evidence of inferior hypokinesis. For full details, please see the dictated report.  Due to these findings, surgical consultation was obtained with Darylene Price, MD who evaluated the patient and studies and did agree with recommendations to proceed with surgical revascularization.  She was monitored and maintained medically stable until the procedure on November 25, 2010.  She underwent the following procedure:  Coronary artery bypass grafting x3. The following grafts were placed. 1. Left internal mammary artery to the LAD. 2. Saphenous vein graft to diagonal #1. 3. Saphenous vein graft to the posterior descending.  Of note, she did     have a coronary endarterectomy of the obtuse marginal #1 stenosis.     She tolerated the procedure well and was taken to the Surgical     Intensive Care Unit in stable condition.  POSTOPERATIVE HOSPITAL COURSE:  The patient overall has done quite well from her cardiac viewpoint.  She did have postoperative atrial fibrillation but has subsequently been chemically cardioverted to normal sinus rhythm with amiodarone and beta blockade.  She was also started on Coumadin therapy.  She has had all routine lines, monitors, drainage devices discontinued in the standard fashion.  She does have an acute blood loss anemia with values that are stabilized.  Most recent hemoglobin/hematocrit dated December 06, 2010 are 10.5 and 31.4 respectively.  Electrolytes, BUN, and creatinine are within normal limits.  Her primary difficulty during the postoperative period was due to Clostridium difficile diarrhea.  She was noted to have been on a preoperative Cipro regimen for a urinary tract infection.  This was felt to have probably contributed to this.  She has been treated aggressively with both vancomycin as well as Flagyl.  She has had a slow recovery in this regard  with significant amounts of diarrhea for several days also requiring fluid replacement with some hyponatremia at times.  This has all shown substantial improvement over time.  Her incisions are healing well without evidence of infection.  She is currently tolerating diet without significant difficulties, although she does have some ongoing gas pain and discomfort in her abdomen.  She currently is felt to be tentatively stable for discharge in the morning of December 07, 2010, pending morning reevaluation.  MEDICATIONS:  At the time of this dictation include the following: 1. Amiodarone 200 mg p.o. b.i.d. 2. Enteric-coated aspirin 325 mg p.o. daily. 3. Coumadin 2 mg daily and as directed.  We will determine at actual     discharge whether she will be required to continue on Coumadin at     this time. 4. Metoprolol XL 25 mg daily. 5. For pain, oxycodone 5 mg 1-2 every 3-4 hours p.r.n. 6. Crestor 40 mg p.o. daily. 7. Calcium carbonate/vitamin D 600 mg 1 tablet daily. 8. Multivitamin 1 daily. 9. Nexium  40 mg daily. 10.Vitamin C 1000 mg daily.  For the time being, she has not been restarted on her hydrochlorothiazide or clonidine patch for hypertension.  Her vitals have been quite stable but this certainly may require a restart in the future depending on how her blood pressure responds.  DISCHARGE INSTRUCTIONS:  The patient received written instructions in regard to medications, activity, diet, wound care, and followup. Followup included Dr. Roxy Manns on January 02, 2011 at 1:00 p.m. Additionally, she will follow up with Dr. Aundra Dubin at [facility] in 2 weeks.  We will also make arrangements if we do continue Coumadin for her to be seen in the [facility] for management.  FINAL DIAGNOSES: 1. Severe coronary artery disease as described, now status post     surgical revascularization with previous history of nonobstructive     coronary artery disease in 2004. 2.  History of mild mitral regurgitation. 3. History of hypertension. 4. History of hyperlipidemia. 5. History of pericarditis in the 1980s. 6. History of gastroesophageal reflux disease. 7. History of premature atrial contractions. 8. History of premature ventricular contractions. 9. Postoperative C. difficile colitis. 10.Postoperative thrombocytopenia which has resolved.  Most recent     platelet count is 116,000 on December 06, 2010.  OTHER DIAGNOSES:  Postoperative volume overload.  CONDITION ON DISCHARGE:  Stable, improved.     John Giovanni, P.A.-C.   ______________________________ Valentina Gu Roxy Manns, M.D.    Loren Racer  D:  12/06/2010  T:  12/06/2010  Job:  [id number]  cc:   Loralie Champagne, MD Tacey Ruiz, M.D.  Electronically Signed by Jadene Pierini P.A.-C. on 12/12/2010 02:49:43 PM Electronically Signed by Darylene Price M.D. on 12/13/2010 10:50:29 AM

## 2010-12-13 NOTE — Telephone Encounter (Signed)
I talked with pt's husband.Husband states pt was diagnosed with C-difficile  while in hospital a couple of days after CABG 11/25/10. Husband states pt was given Flagyl in the hospital but not discharged on it. Husband states pt's diarrhea had improved but not completely resolved since DC 12/07/10. Husband calling today because pt had a temperature of 101.8 last night. Pt has taken some Tylenol and this morning her temperature is 98.8. Husband states is the last few days pt's diarrhea has become more frequent. Husband states pt's sternal incision is healing well without any drainage or redness or signs of infection. I reviewed with Dr Aundra Dubin. He recommended pt call Dr Guy Sandifer office for further recommendations and treatment. Pt's husband agreed with this.

## 2010-12-13 NOTE — Telephone Encounter (Signed)
Per pt hubsand called. Was told by Dr. Radford Pax to call the office this am. S/p open heart surgery on 7/13  Wife has  C- diff . Temp went up 108. Last night . tynelol was given. Temp now is  98.8

## 2010-12-14 DIAGNOSIS — A0472 Enterocolitis due to Clostridium difficile, not specified as recurrent: Secondary | ICD-10-CM | POA: Insufficient documentation

## 2010-12-14 HISTORY — DX: Enterocolitis due to Clostridium difficile, not specified as recurrent: A04.72

## 2010-12-16 ENCOUNTER — Inpatient Hospital Stay (HOSPITAL_COMMUNITY): Payer: Medicare Other

## 2010-12-16 ENCOUNTER — Ambulatory Visit (INDEPENDENT_AMBULATORY_CARE_PROVIDER_SITE_OTHER): Payer: Self-pay

## 2010-12-16 ENCOUNTER — Inpatient Hospital Stay (HOSPITAL_COMMUNITY)
Admission: AD | Admit: 2010-12-16 | Discharge: 2010-12-31 | DRG: 329 | Disposition: A | Discharge status: Home Health | Payer: Medicare Other | Source: Ambulatory Visit | Attending: Thoracic Surgery (Cardiothoracic Vascular Surgery) | Admitting: Thoracic Surgery (Cardiothoracic Vascular Surgery)

## 2010-12-16 ENCOUNTER — Other Ambulatory Visit: Payer: Self-pay | Admitting: Thoracic Surgery (Cardiothoracic Vascular Surgery)

## 2010-12-16 ENCOUNTER — Ambulatory Visit
Admission: RE | Admit: 2010-12-16 | Discharge: 2010-12-16 | Disposition: A | Discharge status: Home / Self Care | Payer: Medicare Other | Source: Ambulatory Visit | Attending: Thoracic Surgery (Cardiothoracic Vascular Surgery) | Admitting: Thoracic Surgery (Cardiothoracic Vascular Surgery)

## 2010-12-16 DIAGNOSIS — R0602 Shortness of breath: Secondary | ICD-10-CM

## 2010-12-16 DIAGNOSIS — K560 Paralytic ileus: Secondary | ICD-10-CM

## 2010-12-16 DIAGNOSIS — J90 Pleural effusion, not elsewhere classified: Secondary | ICD-10-CM | POA: Diagnosis present

## 2010-12-16 DIAGNOSIS — D62 Acute posthemorrhagic anemia: Secondary | ICD-10-CM | POA: Diagnosis not present

## 2010-12-16 DIAGNOSIS — R188 Other ascites: Secondary | ICD-10-CM | POA: Diagnosis present

## 2010-12-16 DIAGNOSIS — A0472 Enterocolitis due to Clostridium difficile, not specified as recurrent: Secondary | ICD-10-CM | POA: Diagnosis present

## 2010-12-16 DIAGNOSIS — I219 Acute myocardial infarction, unspecified: Secondary | ICD-10-CM | POA: Diagnosis present

## 2010-12-16 DIAGNOSIS — B965 Pseudomonas (aeruginosa) (mallei) (pseudomallei) as the cause of diseases classified elsewhere: Secondary | ICD-10-CM | POA: Diagnosis present

## 2010-12-16 DIAGNOSIS — R5381 Other malaise: Secondary | ICD-10-CM | POA: Diagnosis present

## 2010-12-16 DIAGNOSIS — K659 Peritonitis, unspecified: Secondary | ICD-10-CM | POA: Diagnosis present

## 2010-12-16 DIAGNOSIS — I4891 Unspecified atrial fibrillation: Secondary | ICD-10-CM | POA: Diagnosis present

## 2010-12-16 DIAGNOSIS — I2510 Atherosclerotic heart disease of native coronary artery without angina pectoris: Secondary | ICD-10-CM

## 2010-12-16 DIAGNOSIS — A09 Infectious gastroenteritis and colitis, unspecified: Secondary | ICD-10-CM

## 2010-12-16 DIAGNOSIS — E43 Unspecified severe protein-calorie malnutrition: Secondary | ICD-10-CM | POA: Diagnosis present

## 2010-12-16 DIAGNOSIS — K631 Perforation of intestine (nontraumatic): Principal | ICD-10-CM | POA: Diagnosis present

## 2010-12-16 DIAGNOSIS — I10 Essential (primary) hypertension: Secondary | ICD-10-CM | POA: Diagnosis present

## 2010-12-16 DIAGNOSIS — E785 Hyperlipidemia, unspecified: Secondary | ICD-10-CM | POA: Diagnosis present

## 2010-12-16 DIAGNOSIS — T8140XA Infection following a procedure, unspecified, initial encounter: Secondary | ICD-10-CM | POA: Diagnosis not present

## 2010-12-16 DIAGNOSIS — E871 Hypo-osmolality and hyponatremia: Secondary | ICD-10-CM | POA: Diagnosis present

## 2010-12-16 DIAGNOSIS — R197 Diarrhea, unspecified: Secondary | ICD-10-CM

## 2010-12-16 DIAGNOSIS — D696 Thrombocytopenia, unspecified: Secondary | ICD-10-CM | POA: Diagnosis not present

## 2010-12-16 DIAGNOSIS — Y833 Surgical operation with formation of external stoma as the cause of abnormal reaction of the patient, or of later complication, without mention of misadventure at the time of the procedure: Secondary | ICD-10-CM | POA: Diagnosis present

## 2010-12-16 DIAGNOSIS — Z951 Presence of aortocoronary bypass graft: Secondary | ICD-10-CM

## 2010-12-16 DIAGNOSIS — T8131XA Disruption of external operation (surgical) wound, not elsewhere classified, initial encounter: Secondary | ICD-10-CM | POA: Diagnosis not present

## 2010-12-16 LAB — PROTIME-INR: INR: 1.29 (ref 0.00–1.49)

## 2010-12-16 LAB — CBC
HCT: 31.9 % — ABNORMAL LOW (ref 36.0–46.0)
MCH: 27.5 pg (ref 26.0–34.0)
MCHC: 32.3 g/dL (ref 30.0–36.0)
RDW: 17.6 % — ABNORMAL HIGH (ref 11.5–15.5)

## 2010-12-16 LAB — DIFFERENTIAL
Basophils Absolute: 0 10*3/uL (ref 0.0–0.1)
Eosinophils Absolute: 0 10*3/uL (ref 0.0–0.7)
Lymphs Abs: 0.7 10*3/uL (ref 0.7–4.0)
Monocytes Absolute: 0.8 10*3/uL (ref 0.1–1.0)
Monocytes Relative: 10 % (ref 3–12)
Neutro Abs: 6.3 10*3/uL (ref 1.7–7.7)

## 2010-12-16 LAB — COMPREHENSIVE METABOLIC PANEL
Albumin: 1.7 g/dL — ABNORMAL LOW (ref 3.5–5.2)
Alkaline Phosphatase: 194 U/L — ABNORMAL HIGH (ref 39–117)
BUN: 16 mg/dL (ref 6–23)
Chloride: 93 mEq/L — ABNORMAL LOW (ref 96–112)
Creatinine, Ser: 0.75 mg/dL (ref 0.50–1.10)
GFR calc Af Amer: >60 mL/min (ref >60)
GFR calc non Af Amer: >60 mL/min (ref >60)
Glucose, Bld: 99 mg/dL (ref 70–99)
Total Bilirubin: 0.3 mg/dL (ref 0.3–1.2)

## 2010-12-16 LAB — MRSA PCR SCREENING: MRSA by PCR: NEGATIVE

## 2010-12-17 ENCOUNTER — Inpatient Hospital Stay (HOSPITAL_COMMUNITY): Payer: Medicare Other

## 2010-12-17 DIAGNOSIS — R740 Nonspecific elevation of levels of transaminase and lactic acid dehydrogenase [LDH]: Secondary | ICD-10-CM

## 2010-12-17 DIAGNOSIS — I517 Cardiomegaly: Secondary | ICD-10-CM

## 2010-12-17 DIAGNOSIS — R109 Unspecified abdominal pain: Secondary | ICD-10-CM

## 2010-12-17 DIAGNOSIS — R609 Edema, unspecified: Secondary | ICD-10-CM

## 2010-12-17 DIAGNOSIS — R197 Diarrhea, unspecified: Secondary | ICD-10-CM

## 2010-12-17 DIAGNOSIS — E440 Moderate protein-calorie malnutrition: Secondary | ICD-10-CM

## 2010-12-17 DIAGNOSIS — A0472 Enterocolitis due to Clostridium difficile, not specified as recurrent: Secondary | ICD-10-CM

## 2010-12-17 LAB — DIFFERENTIAL
Basophils Absolute: 0 10*3/uL (ref 0.0–0.1)
Eosinophils Relative: 1 % (ref 0–5)
Lymphocytes Relative: 18 % (ref 12–46)
Lymphs Abs: 0.4 10*3/uL — ABNORMAL LOW (ref 0.7–4.0)
Monocytes Relative: 7 % (ref 3–12)
Neutro Abs: 1.8 10*3/uL (ref 1.7–7.7)

## 2010-12-17 LAB — BASIC METABOLIC PANEL
CO2: 24 mEq/L (ref 19–32)
Calcium: 7 mg/dL — ABNORMAL LOW (ref 8.4–10.5)
Chloride: 98 mEq/L (ref 96–112)
Creatinine, Ser: 0.6 mg/dL (ref 0.50–1.10)
Glucose, Bld: 118 mg/dL — ABNORMAL HIGH (ref 70–99)

## 2010-12-17 LAB — CBC
HCT: 29 % — ABNORMAL LOW (ref 36.0–46.0)
Hemoglobin: 9.4 g/dL — ABNORMAL LOW (ref 12.0–15.0)
MCH: 27.9 pg (ref 26.0–34.0)
MCV: 86.1 fL (ref 78.0–100.0)
Platelets: 67 10*3/uL — ABNORMAL LOW (ref 150–400)
Platelets: 71 10*3/uL — ABNORMAL LOW (ref 150–400)
RBC: 3.37 MIL/uL — ABNORMAL LOW (ref 3.87–5.11)
RBC: 3.4 MIL/uL — ABNORMAL LOW (ref 3.87–5.11)
RDW: 17.6 % — ABNORMAL HIGH (ref 11.5–15.5)
WBC: 7.3 10*3/uL (ref 4.0–10.5)
WBC: 2.4 10*3/uL — ABNORMAL LOW (ref 4.0–10.5)

## 2010-12-17 LAB — PREALBUMIN: Prealbumin: 8 mg/dL — ABNORMAL LOW (ref 17.0–34.0)

## 2010-12-18 ENCOUNTER — Other Ambulatory Visit (INDEPENDENT_AMBULATORY_CARE_PROVIDER_SITE_OTHER): Payer: Self-pay | Admitting: General Surgery

## 2010-12-18 ENCOUNTER — Inpatient Hospital Stay (HOSPITAL_COMMUNITY): Payer: Medicare Other

## 2010-12-18 DIAGNOSIS — J90 Pleural effusion, not elsewhere classified: Secondary | ICD-10-CM

## 2010-12-18 DIAGNOSIS — E440 Moderate protein-calorie malnutrition: Secondary | ICD-10-CM

## 2010-12-18 DIAGNOSIS — R188 Other ascites: Secondary | ICD-10-CM

## 2010-12-18 DIAGNOSIS — D649 Anemia, unspecified: Secondary | ICD-10-CM

## 2010-12-18 DIAGNOSIS — K560 Paralytic ileus: Secondary | ICD-10-CM

## 2010-12-18 DIAGNOSIS — K631 Perforation of intestine (nontraumatic): Secondary | ICD-10-CM

## 2010-12-18 DIAGNOSIS — A0472 Enterocolitis due to Clostridium difficile, not specified as recurrent: Secondary | ICD-10-CM

## 2010-12-18 HISTORY — PX: SIGMOIDECTOMY: SHX176

## 2010-12-18 LAB — COMPREHENSIVE METABOLIC PANEL
ALT: 45 U/L — ABNORMAL HIGH (ref 0–35)
AST: 34 U/L (ref 0–37)
Albumin: 1.5 g/dL — ABNORMAL LOW (ref 3.5–5.2)
Alkaline Phosphatase: 127 U/L — ABNORMAL HIGH (ref 39–117)
BUN: 14 mg/dL (ref 6–23)
CO2: 22 mEq/L (ref 19–32)
Calcium: 6.4 mg/dL — CL (ref 8.4–10.5)
Chloride: 102 mEq/L (ref 96–112)
Creatinine, Ser: 0.56 mg/dL (ref 0.50–1.10)
GFR calc Af Amer: >60 mL/min (ref >60)
GFR calc non Af Amer: >60 mL/min (ref >60)
Glucose, Bld: 127 mg/dL — ABNORMAL HIGH (ref 70–99)
Potassium: 4.3 mEq/L (ref 3.5–5.1)
Sodium: 130 mEq/L — ABNORMAL LOW (ref 135–145)
Total Bilirubin: 0.3 mg/dL (ref 0.3–1.2)
Total Protein: 4.2 g/dL — ABNORMAL LOW (ref 6.0–8.3)

## 2010-12-18 LAB — CBC
HCT: 25.3 % — ABNORMAL LOW (ref 36.0–46.0)
HCT: 28.4 % — ABNORMAL LOW (ref 36.0–46.0)
Hemoglobin: 8.2 g/dL — ABNORMAL LOW (ref 12.0–15.0)
Hemoglobin: 9 g/dL — ABNORMAL LOW (ref 12.0–15.0)
MCH: 27.5 pg (ref 26.0–34.0)
MCH: 28.3 pg (ref 26.0–34.0)
MCHC: 31.7 g/dL (ref 30.0–36.0)
MCHC: 32.7 g/dL (ref 30.0–36.0)
MCV: 84.6 fL (ref 78.0–100.0)
MCV: 86.9 fL (ref 78.0–100.0)
Platelets: 59 10*3/uL — ABNORMAL LOW (ref 150–400)
RBC: 3.27 MIL/uL — ABNORMAL LOW (ref 3.87–5.11)
RDW: 17.7 % — ABNORMAL HIGH (ref 11.5–15.5)
RDW: 17.8 % — ABNORMAL HIGH (ref 11.5–15.5)
RDW: 18.3 % — ABNORMAL HIGH (ref 11.5–15.5)
WBC: 6 10*3/uL (ref 4.0–10.5)
WBC: 6.6 10*3/uL (ref 4.0–10.5)

## 2010-12-18 LAB — PROTIME-INR
INR: 1.95 — ABNORMAL HIGH (ref 0.00–1.49)
INR: 1.55 — ABNORMAL HIGH (ref 0.00–1.49)
Prothrombin Time: 18.9 seconds — ABNORMAL HIGH (ref 11.6–15.2)

## 2010-12-18 LAB — MRSA PCR SCREENING: MRSA by PCR: NEGATIVE

## 2010-12-18 LAB — BASIC METABOLIC PANEL
BUN: 13 mg/dL (ref 6–23)
Chloride: 101 mEq/L (ref 96–112)
Creatinine, Ser: 0.56 mg/dL (ref 0.50–1.10)
GFR calc Af Amer: >60 mL/min (ref >60)
GFR calc non Af Amer: >60 mL/min (ref >60)
Glucose, Bld: 135 mg/dL — ABNORMAL HIGH (ref 70–99)
Potassium: 3.8 mEq/L (ref 3.5–5.1)

## 2010-12-18 LAB — POCT I-STAT 4, (NA,K, GLUC, HGB,HCT)
Glucose, Bld: 115 mg/dL — ABNORMAL HIGH (ref 70–99)
HCT: 25 % — ABNORMAL LOW (ref 36.0–46.0)
Hemoglobin: 9.2 g/dL — ABNORMAL LOW (ref 12.0–15.0)
Hemoglobin: 8.5 g/dL — ABNORMAL LOW (ref 12.0–15.0)
Potassium: 4.3 mEq/L (ref 3.5–5.1)
Potassium: 4.1 mEq/L (ref 3.5–5.1)
Sodium: 130 mEq/L — ABNORMAL LOW (ref 135–145)

## 2010-12-18 LAB — POCT I-STAT 3, ART BLOOD GAS (G3+)
Acid-base deficit: 3 mmol/L — ABNORMAL HIGH (ref 0.0–2.0)
O2 Saturation: 100 %
Patient temperature: 97.3

## 2010-12-18 LAB — APTT: aPTT: 36 seconds (ref 24–37)

## 2010-12-18 MED ORDER — IOHEXOL 300 MG/ML  SOLN
100.0000 mL | Freq: Once | INTRAMUSCULAR | Status: AC | PRN
  Administered 2010-12-18: 100 mL via INTRAVENOUS

## 2010-12-19 DIAGNOSIS — R6520 Severe sepsis without septic shock: Secondary | ICD-10-CM

## 2010-12-19 DIAGNOSIS — J9600 Acute respiratory failure, unspecified whether with hypoxia or hypercapnia: Secondary | ICD-10-CM

## 2010-12-19 DIAGNOSIS — R6521 Severe sepsis with septic shock: Secondary | ICD-10-CM

## 2010-12-19 DIAGNOSIS — A0472 Enterocolitis due to Clostridium difficile, not specified as recurrent: Secondary | ICD-10-CM

## 2010-12-19 DIAGNOSIS — K631 Perforation of intestine (nontraumatic): Secondary | ICD-10-CM

## 2010-12-19 DIAGNOSIS — E440 Moderate protein-calorie malnutrition: Secondary | ICD-10-CM

## 2010-12-19 DIAGNOSIS — A419 Sepsis, unspecified organism: Secondary | ICD-10-CM

## 2010-12-19 DIAGNOSIS — I2510 Atherosclerotic heart disease of native coronary artery without angina pectoris: Secondary | ICD-10-CM

## 2010-12-19 LAB — CBC
HCT: 25.9 % — ABNORMAL LOW (ref 36.0–46.0)
HCT: 28.4 % — ABNORMAL LOW (ref 36.0–46.0)
Hemoglobin: 8.5 g/dL — ABNORMAL LOW (ref 12.0–15.0)
Hemoglobin: 9.5 g/dL — ABNORMAL LOW (ref 12.0–15.0)
MCH: 27.9 pg (ref 26.0–34.0)
MCHC: 33.5 g/dL (ref 30.0–36.0)
MCV: 84.6 fL (ref 78.0–100.0)
MCV: 83.3 fL (ref 78.0–100.0)
Platelets: 84 10*3/uL — ABNORMAL LOW (ref 150–400)
RBC: 3.41 MIL/uL — ABNORMAL LOW (ref 3.87–5.11)
RDW: 19 % — ABNORMAL HIGH (ref 11.5–15.5)
RDW: 18.6 % — ABNORMAL HIGH (ref 11.5–15.5)
WBC: 7 10*3/uL (ref 4.0–10.5)
WBC: 8.4 10*3/uL (ref 4.0–10.5)

## 2010-12-19 LAB — COMPREHENSIVE METABOLIC PANEL
Alkaline Phosphatase: 86 U/L (ref 39–117)
BUN: 12 mg/dL (ref 6–23)
CO2: 22 mEq/L (ref 19–32)
Chloride: 103 mEq/L (ref 96–112)
Creatinine, Ser: <0.47 mg/dL — ABNORMAL LOW (ref 0.50–1.10)
Glucose, Bld: 148 mg/dL — ABNORMAL HIGH (ref 70–99)
Potassium: 3.6 mEq/L (ref 3.5–5.1)
Total Bilirubin: 0.3 mg/dL (ref 0.3–1.2)

## 2010-12-19 LAB — BASIC METABOLIC PANEL
BUN: 12 mg/dL (ref 6–23)
CO2: 22 mEq/L (ref 19–32)
Calcium: 6.7 mg/dL — ABNORMAL LOW (ref 8.4–10.5)
Chloride: 101 mEq/L (ref 96–112)
Creatinine, Ser: 0.47 mg/dL — ABNORMAL LOW (ref 0.50–1.10)
GFR calc Af Amer: >60 mL/min (ref >60)
GFR calc non Af Amer: >60 mL/min (ref >60)
Glucose, Bld: 111 mg/dL — ABNORMAL HIGH (ref 70–99)
Potassium: 3.1 mEq/L — ABNORMAL LOW (ref 3.5–5.1)
Sodium: 131 mEq/L — ABNORMAL LOW (ref 135–145)

## 2010-12-19 LAB — PREPARE FRESH FROZEN PLASMA: Unit division: 0

## 2010-12-19 LAB — GLUCOSE, CAPILLARY
Glucose-Capillary: 140 mg/dL — ABNORMAL HIGH (ref 70–99)
Glucose-Capillary: 127 mg/dL — ABNORMAL HIGH (ref 70–99)
Glucose-Capillary: 137 mg/dL — ABNORMAL HIGH (ref 70–99)

## 2010-12-19 LAB — PREPARE PLATELET PHERESIS

## 2010-12-20 ENCOUNTER — Inpatient Hospital Stay (HOSPITAL_COMMUNITY): Payer: Medicare Other

## 2010-12-20 DIAGNOSIS — A0472 Enterocolitis due to Clostridium difficile, not specified as recurrent: Secondary | ICD-10-CM

## 2010-12-20 DIAGNOSIS — J9600 Acute respiratory failure, unspecified whether with hypoxia or hypercapnia: Secondary | ICD-10-CM

## 2010-12-20 DIAGNOSIS — R6520 Severe sepsis without septic shock: Secondary | ICD-10-CM

## 2010-12-20 DIAGNOSIS — A419 Sepsis, unspecified organism: Secondary | ICD-10-CM

## 2010-12-20 DIAGNOSIS — R6521 Severe sepsis with septic shock: Secondary | ICD-10-CM

## 2010-12-20 LAB — DIFFERENTIAL
Basophils Absolute: 0 10*3/uL (ref 0.0–0.1)
Basophils Relative: 0 % (ref 0–1)
Eosinophils Absolute: 0.1 10*3/uL (ref 0.0–0.7)
Eosinophils Relative: 1 % (ref 0–5)
Lymphocytes Relative: 8 % — ABNORMAL LOW (ref 12–46)
Lymphs Abs: 0.7 10*3/uL (ref 0.7–4.0)
Monocytes Absolute: 0.3 10*3/uL (ref 0.1–1.0)
Monocytes Relative: 4 % (ref 3–12)
Neutro Abs: 7.2 10*3/uL (ref 1.7–7.7)
Neutrophils Relative %: 87 % — ABNORMAL HIGH (ref 43–77)

## 2010-12-20 LAB — CBC
HCT: 29.1 % — ABNORMAL LOW (ref 36.0–46.0)
Hemoglobin: 9.7 g/dL — ABNORMAL LOW (ref 12.0–15.0)
MCH: 27.9 pg (ref 26.0–34.0)
MCHC: 33.3 g/dL (ref 30.0–36.0)
MCV: 83.6 fL (ref 78.0–100.0)
Platelets: 101 10*3/uL — ABNORMAL LOW (ref 150–400)
RBC: 3.48 MIL/uL — ABNORMAL LOW (ref 3.87–5.11)
RDW: 18.7 % — ABNORMAL HIGH (ref 11.5–15.5)
WBC: 8.3 10*3/uL (ref 4.0–10.5)

## 2010-12-20 LAB — BLOOD GAS, ARTERIAL
Bicarbonate: 26.7 mEq/L — ABNORMAL HIGH (ref 20.0–24.0)
Drawn by: 27022
PEEP: 5 cmH2O
Patient temperature: 98.6
Pressure support: 5 cmH2O
pH, Arterial: 7.445 — ABNORMAL HIGH (ref 7.350–7.400)

## 2010-12-20 LAB — COMPREHENSIVE METABOLIC PANEL
ALT: 31 U/L (ref 0–35)
AST: 29 U/L (ref 0–37)
Albumin: 1.3 g/dL — ABNORMAL LOW (ref 3.5–5.2)
Alkaline Phosphatase: 131 U/L — ABNORMAL HIGH (ref 39–117)
BUN: 14 mg/dL (ref 6–23)
CO2: 26 mEq/L (ref 19–32)
Calcium: 6.6 mg/dL — ABNORMAL LOW (ref 8.4–10.5)
Chloride: 100 mEq/L (ref 96–112)
Creatinine, Ser: 0.51 mg/dL (ref 0.50–1.10)
GFR calc Af Amer: >60 mL/min (ref >60)
GFR calc non Af Amer: >60 mL/min (ref >60)
Glucose, Bld: 133 mg/dL — ABNORMAL HIGH (ref 70–99)
Potassium: 2.9 mEq/L — ABNORMAL LOW (ref 3.5–5.1)
Sodium: 134 mEq/L — ABNORMAL LOW (ref 135–145)
Total Bilirubin: 0.3 mg/dL (ref 0.3–1.2)
Total Protein: 4.4 g/dL — ABNORMAL LOW (ref 6.0–8.3)

## 2010-12-20 LAB — POCT I-STAT 3, ART BLOOD GAS (G3+)
Bicarbonate: 24.9 mEq/L — ABNORMAL HIGH (ref 20.0–24.0)
pH, Arterial: 7.465 — ABNORMAL HIGH (ref 7.350–7.400)
pO2, Arterial: 92 mmHg (ref 80.0–100.0)

## 2010-12-20 LAB — BASIC METABOLIC PANEL
Calcium: 6.7 mg/dL — ABNORMAL LOW (ref 8.4–10.5)
Chloride: 100 mEq/L (ref 96–112)
Creatinine, Ser: 0.58 mg/dL (ref 0.50–1.10)
GFR calc Af Amer: >60 mL/min (ref >60)
GFR calc non Af Amer: >60 mL/min (ref >60)

## 2010-12-20 LAB — TYPE AND SCREEN
ABO/RH(D): A POS
Unit division: 0

## 2010-12-20 LAB — GLUCOSE, CAPILLARY

## 2010-12-20 LAB — MAGNESIUM
Magnesium: 1.9 mg/dL (ref 1.5–2.5)
Magnesium: 2.2 mg/dL (ref 1.5–2.5)

## 2010-12-20 LAB — PHOSPHORUS: Phosphorus: 2.8 mg/dL (ref 2.3–4.6)

## 2010-12-21 ENCOUNTER — Inpatient Hospital Stay (HOSPITAL_COMMUNITY): Payer: Medicare Other

## 2010-12-21 DIAGNOSIS — R6521 Severe sepsis with septic shock: Secondary | ICD-10-CM

## 2010-12-21 DIAGNOSIS — A419 Sepsis, unspecified organism: Secondary | ICD-10-CM

## 2010-12-21 DIAGNOSIS — A0472 Enterocolitis due to Clostridium difficile, not specified as recurrent: Secondary | ICD-10-CM

## 2010-12-21 DIAGNOSIS — R6520 Severe sepsis without septic shock: Secondary | ICD-10-CM

## 2010-12-21 DIAGNOSIS — J9600 Acute respiratory failure, unspecified whether with hypoxia or hypercapnia: Secondary | ICD-10-CM

## 2010-12-21 LAB — MAGNESIUM: Magnesium: 2.1 mg/dL (ref 1.5–2.5)

## 2010-12-21 LAB — CBC
HCT: 29.3 % — ABNORMAL LOW (ref 36.0–46.0)
MCH: 27.8 pg (ref 26.0–34.0)
MCV: 84.9 fL (ref 78.0–100.0)
Platelets: 124 10*3/uL — ABNORMAL LOW (ref 150–400)
RDW: 17.9 % — ABNORMAL HIGH (ref 11.5–15.5)

## 2010-12-21 LAB — GLUCOSE, CAPILLARY
Glucose-Capillary: 119 mg/dL — ABNORMAL HIGH (ref 70–99)
Glucose-Capillary: 154 mg/dL — ABNORMAL HIGH (ref 70–99)
Glucose-Capillary: 128 mg/dL — ABNORMAL HIGH (ref 70–99)
Glucose-Capillary: 142 mg/dL — ABNORMAL HIGH (ref 70–99)
Glucose-Capillary: 140 mg/dL — ABNORMAL HIGH (ref 70–99)
Glucose-Capillary: 156 mg/dL — ABNORMAL HIGH (ref 70–99)

## 2010-12-21 LAB — BASIC METABOLIC PANEL
BUN: 18 mg/dL (ref 6–23)
Calcium: 6.5 mg/dL — ABNORMAL LOW (ref 8.4–10.5)
Creatinine, Ser: 0.5 mg/dL (ref 0.50–1.10)
GFR calc Af Amer: >60 mL/min (ref >60)

## 2010-12-21 NOTE — Consult Note (Signed)
NAMEGERALDYN, SHAIN             ACCOUNT NO.:  [id number]  MEDICAL RECORD NO.:  [id number]  LOCATION:  2703                         FACILITY:  [facility]  PHYSICIAN:  Sammuel Hines. Daiva Nakayama, M.D. DATE OF BIRTH:  20-Aug-1940  DATE OF CONSULTATION:  12/17/2010 DATE OF DISCHARGE:                                CONSULTATION   We were asked to see [name] in consultation by Dr. Servando Snare to evaluate her for C. diff colitis.  [name] is a 70 year old white female who is now about 3 weeks out from an acute MI and coronary artery bypass grafting procedure.  Her postoperative course was complicated by C. diff colitis.  She was treated with vancomycin and Flagyl in the hospital originally and improved.  She then went home.  It is not clear whether she went home on any antibiotic regimen, but she worsened after returning home and was readmitted yesterday with lower abdominal pain and diarrhea.  She apparently was feeling better as the day went on today, but tonight has spiked a fever to 103 and has had a little bit more lower abdominal pain than what she was having before.  PAST MEDICAL HISTORY:  Significant for: 1. Coronary artery disease. 2. Recent MI. 3. Hypertension. 4. Hyperlipidemia. 5. Abdominal mass. 6. Hemorrhoids. 7. Pericarditis. 8. Gastroesophageal reflux. 9. C.  Diff colitis. 10.Thrombocytopenia. 11.Urinary tract infection. 12.Atrial fibrillation.  PAST SURGICAL HISTORY:  Significant for: 1. Appendectomy. 2. Lysis of adhesions. 3. 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 4. Hemorrhoidectomy. 5. Left breast biopsy. 6. Coronary artery bypass grafting.  MEDICATIONS:  Clonidine, hydrochlorothiazide, Nexium, vitamins.  ALLERGIES: 1. CORGARD. 2. VERAPAMIL. 3. SULFA. 4. SHELL FISH.  SOCIAL HISTORY:  She denies use of tobacco or alcohol use.  FAMILY HISTORY:  Significant for MI in her father and coronary artery disease in  her mother.  PHYSICAL EXAMINATION:  VITAL SIGNS:  Her current temperature is 103, most recent blood pressure is 104/51 with a pulse of 82, O2 sat of 96%. GENERAL:  She has generalized anasarca.  She is lying in bed, appears little uncomfortable. SKIN:  Warm and dry.  No jaundice. HEENT:  Eyes:  Extraocular muscles are intact.  Pupils are equal, round and reactive to light.  Sclerae are nonicteric. LUNGS:  Clear bilaterally with no use of accessory respiratory muscles. HEART:  Regular rate and rhythm with impulse in left chest. ABDOMEN:  On abdominal exam, she has soft upper abdomen.  She is tender over her lower abdomen.  Again, she has generalized anasarca.  No generalized peritonitis, but some focal guarding down the lobe. EXTREMITIES:  No cyanosis or clubbing.  She has generalized edema. PSYCHOLOGIC:  Psychologically, she is alert and oriented x3 with no evidence of anxiety or depression.  On review of her lab work, her white count this morning was normal at 7300, albumin was 1.6.  Her abdominal x-ray from this evening showed some gaseous dilation of the lower colon, but no obvious megacolon, no free air, no obvious bowel obstruction.  ASSESSMENT AND PLAN:  This is a 70 year old white female with pretty significant Clostridium difficile colitis,  that is recurrent.  I suspect that she has some element of an ileus as well, so I am not sure that oral vancomycin is effectively getting to her colon.  Unfortunately, she is also very high risk for any sort of surgical intervention because of her recent myocardial infarction, her thrombocytopenia, and her severe malnutrition.  At this point, I would recommend starting her on IV Flagyl and vancomycin enemas.  She may also benefit from some TPN and we will continue to follow her closely with you.     Sammuel Hines. Daiva Nakayama, M.D.     PST/MEDQ  D:  12/17/2010  T:  12/18/2010  Job:  [id number]  Electronically Signed by Autumn Messing III M.D. on  12/21/2010 08:33:29 AM

## 2010-12-22 ENCOUNTER — Inpatient Hospital Stay (HOSPITAL_COMMUNITY): Payer: Medicare Other

## 2010-12-22 DIAGNOSIS — R6521 Severe sepsis with septic shock: Secondary | ICD-10-CM

## 2010-12-22 DIAGNOSIS — R6520 Severe sepsis without septic shock: Secondary | ICD-10-CM

## 2010-12-22 DIAGNOSIS — A0472 Enterocolitis due to Clostridium difficile, not specified as recurrent: Secondary | ICD-10-CM

## 2010-12-22 DIAGNOSIS — A419 Sepsis, unspecified organism: Secondary | ICD-10-CM

## 2010-12-22 DIAGNOSIS — J9600 Acute respiratory failure, unspecified whether with hypoxia or hypercapnia: Secondary | ICD-10-CM

## 2010-12-22 LAB — CBC
Hemoglobin: 10 g/dL — ABNORMAL LOW (ref 12.0–15.0)
MCH: 27.5 pg (ref 26.0–34.0)
MCHC: 32.5 g/dL (ref 30.0–36.0)
RDW: 17.4 % — ABNORMAL HIGH (ref 11.5–15.5)

## 2010-12-22 LAB — COMPREHENSIVE METABOLIC PANEL
BUN: 20 mg/dL (ref 6–23)
Calcium: 6.9 mg/dL — ABNORMAL LOW (ref 8.4–10.5)
Glucose, Bld: 125 mg/dL — ABNORMAL HIGH (ref 70–99)
Sodium: 132 mEq/L — ABNORMAL LOW (ref 135–145)
Total Protein: 4.7 g/dL — ABNORMAL LOW (ref 6.0–8.3)

## 2010-12-22 LAB — GLUCOSE, CAPILLARY
Glucose-Capillary: 111 mg/dL — ABNORMAL HIGH (ref 70–99)
Glucose-Capillary: 117 mg/dL — ABNORMAL HIGH (ref 70–99)
Glucose-Capillary: 140 mg/dL — ABNORMAL HIGH (ref 70–99)
Glucose-Capillary: 153 mg/dL — ABNORMAL HIGH (ref 70–99)

## 2010-12-22 LAB — BASIC METABOLIC PANEL
BUN: 21 mg/dL (ref 6–23)
CO2: 26 mEq/L (ref 19–32)
Chloride: 97 mEq/L (ref 96–112)
Glucose, Bld: 123 mg/dL — ABNORMAL HIGH (ref 70–99)
Potassium: 4.2 mEq/L (ref 3.5–5.1)

## 2010-12-22 LAB — MAGNESIUM: Magnesium: 1.9 mg/dL (ref 1.5–2.5)

## 2010-12-22 NOTE — Consult Note (Signed)
NAMEDEINA, LIPSEY NO.:  [id number]  MEDICAL RECORD NO.:  [id number]  LOCATION:  4196                         FACILITY:  [facility]  PHYSICIAN:  Fransico Him, M.D.     DATE OF BIRTH:  Sep 14, 1940  DATE OF CONSULTATION:  12/17/2010 DATE OF DISCHARGE:                                CONSULTATION   REFERRING PHYSICIAN:  Lanelle Bal, MD  REASON FOR CONSULTATION:  Anasarca.  PRIMARY CARDIOLOGIST:  Loralie Champagne, MD  HISTORY OF PRESENT ILLNESS:  The patient is a 70 year old female who was recently hospitalized from November 21, 2010 to December 07, 2010 for severe coronary artery disease and underwent coronary artery bypass grafting. Her surgery was complicated postop with prolonged difficulty related to diarrhea secondary to C. diff infection.  She was treated aggressively with both vancomycin as well as Flagyl.  She was discharged home on December 06, 2010.  She had recurrence of diarrhea and on July 31 was restarted on Flagyl by Dr. Cyndia Bent.  She was seen in the office on August 3 due to increasing diarrhea, but also with significant fluid retention in her legs.  She also had a significant amount of increasing abdominal distention.  She denied nausea or vomiting, but had one fever recorded at 101 degrees.  She has gained 30 pounds over her normal weight.  She was readmitted for treatment again for C.diff colitis.  She was found to have anasarca and hypoalbuminemia initially with sodium 124.  Her albumin was found to be markedly decreased at 1.9.  We are now asked to consult for evaluation for anasarca.  PAST MEDICAL HISTORY:  Severe three-vessel coronary disease status post CABG x3 on November 25, 2010, hypertension, dyslipidemia, hemorrhoids, pericarditis in 1980s GERD, palpitations with Holter monitor showing PAC and PVCs in 2003, postop a C.diff colitis, postoperative thrombocytopenia, UTI, postoperative hyponatremia, and postoperative atrial fibrillation.  PAST SURGICAL  HISTORY:  Appendectomy, previous unilateral oophorectomy with salpingectomy, history of exploratory laparotomy for lysis of adhesions, history of hemorrhoidectomy x2, and history of left breast biopsy.  ALLERGIES:  CORGARD, VERAPAMIL, SULFA, and SHELLFISH.  SOCIAL HISTORY:  She is previously from [place].  She lives in [place].  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  She denies any tobacco use.  She is married.  FAMILY HISTORY:  Father died of MI at 39 and her mother died at 66 with history of CAD and bypass in the past.  REVIEW OF SYSTEMS:  Otherwise stated in the HPI is negative.  PHYSICAL EXAMINATION:  VITAL SIGNS:  Her blood pressure is 105/50 and heart rate is 83. GENERAL:  This is a well-developed, well-nourished female in no acute distress. HEENT:  Benign. NECK:  Supple without lymphadenopathy.  Carotid upstrokes 6+, but no bruits. LUNGS:  Clear to auscultation throughout. HEART:  Regular rate and rhythm.  No murmurs, rubs, or gallops.  Normal S1 and S2. ABDOMEN:  Distended, mildly tender to palpation. EXTREMITIES:  +2 edema bilaterally.  LABORATORY DATA:  Sodium 127, potassium 4.2, chloride 98, bicarb 24, BUN 14, creatinine 0.6, white cell count 7.3, hemoglobin 9.4, hematocrit 29, and platelet count 67.  TSH 2.87, albumin 1.7.  Chest x-ray shows bibasilar  atelectasis with small effusions.  ASSESSMENT: 1. Anasarca most likely secondary to severely reduced albumin. 2. Hypoalbuminemia secondary to Clostridium difficile colitis with     diarrhea. 3. Clostridium difficile colitis. 4. Coronary artery disease, status post coronary artery bypass graft. 5. Hyponatremia secondary to volume overload, now improving.  PLAN:  Aggressive nutritional support, increase albumin, we will check a 2-D echocardiogram to make sure LV function is still normal.  We will consider giving albumin infusion with IV Lasix to promote diuresis.  We will discuss with CVTS  surgery.     Fransico Him, M.D.     TT/MEDQ  D:  12/17/2010  T:  12/17/2010  Job:  [id number]  cc:   Loralie Champagne, MD Gilford Raid, M.D.  Electronically Signed by Fransico Him M.D. on 12/22/2010 07:40:58 PM

## 2010-12-23 ENCOUNTER — Inpatient Hospital Stay (HOSPITAL_COMMUNITY): Payer: Medicare Other

## 2010-12-23 LAB — GLUCOSE, CAPILLARY
Glucose-Capillary: 122 mg/dL — ABNORMAL HIGH (ref 70–99)
Glucose-Capillary: 118 mg/dL — ABNORMAL HIGH (ref 70–99)
Glucose-Capillary: 107 mg/dL — ABNORMAL HIGH (ref 70–99)
Glucose-Capillary: 96 mg/dL (ref 70–99)

## 2010-12-23 LAB — CBC
Hemoglobin: 9.5 g/dL — ABNORMAL LOW (ref 12.0–15.0)
MCH: 27.4 pg (ref 26.0–34.0)
MCV: 84.1 fL (ref 78.0–100.0)
Platelets: 118 10*3/uL — ABNORMAL LOW (ref 150–400)
RBC: 3.47 MIL/uL — ABNORMAL LOW (ref 3.87–5.11)
WBC: 8.8 10*3/uL (ref 4.0–10.5)

## 2010-12-23 LAB — BASIC METABOLIC PANEL
CO2: 26 mEq/L (ref 19–32)
Calcium: 7.2 mg/dL — ABNORMAL LOW (ref 8.4–10.5)
Chloride: 101 mEq/L (ref 96–112)
Creatinine, Ser: <0.47 mg/dL — ABNORMAL LOW (ref 0.50–1.10)
Glucose, Bld: 106 mg/dL — ABNORMAL HIGH (ref 70–99)

## 2010-12-23 LAB — BODY FLUID CULTURE

## 2010-12-23 LAB — ANAEROBIC CULTURE

## 2010-12-23 LAB — PHOSPHORUS: Phosphorus: 1.8 mg/dL — ABNORMAL LOW (ref 2.3–4.6)

## 2010-12-23 LAB — MAGNESIUM: Magnesium: 1.8 mg/dL (ref 1.5–2.5)

## 2010-12-23 NOTE — H&P (Signed)
[name], [name]             ACCOUNT NO.:  [id number]  MEDICAL RECORD NO.:  [id number]  LOCATION:  2009                         FACILITY:  [facility]  PHYSICIAN:  Valentina Gu. Roxy Manns, M.D. DATE OF BIRTH:  August 23, 1940  DATE OF ADMISSION:  11/21/2010 DATE OF DISCHARGE:  12/07/2010                             HISTORY & PHYSICAL   HISTORY:  The patient is a 70 year old female who was recently hospitalized from November 21, 2010, to December 07, 2010, for severe coronary artery disease for which she underwent coronary artery bypass grafting. She did quite well postoperatively with one exception.  She had a prolonged difficulty related to diarrhea secondary to Clostridium difficile infection.  She was treated aggressively with both vancomycin as well as Flagyl.  She was feeling well at the time of discharge with resolution of her diarrhea.  Most recently, however, she has had recurrence of diarrhea and on December 13, 2010, she was restarted on Flagyl by Dr. Cyndia Bent.  She is seen on today's date in the office, feeling very poorly.  She has frequent diarrhea.  She is able to take in fluids and is eating fairly well.  She does note additionally significant fluid retention specifically in her legs.  She also has a significant amount of increasing abdominal distention.  She has had no nausea or vomiting. She did have one recorded fever of 101 on Saturday but none since.  Her weight is approximately 30 pounds over her normal weight per her husband.  As she appears to be having difficulty related to the recurrent diarrhea, it is felt that she should be readmitted for further stabilization to include Gastroenterology consultation as well as Infectious Disease consultation.  We will obtain abdominal x-rays and she may require further radiological evaluation based on her consultations and findings.  Past medical history includes severe three-vessel coronary artery disease status post CABG x3 on November 25, 2010.   Other diagnoses include hypertension, hyperlipidemia, previous appendectomy, previous unilateral oophorectomy and salpingectomy, history of exploratory laparotomy for lysis of adhesions, history of hemorrhoidectomy x2, history of left breast biopsy, history of pericarditis in the 1980s, history of gastroesophageal reflux, history of palpitations with a Holter monitor in 2003 which showed PACs and PVCs, postoperative C. difficile colitis, postoperative thrombocytopenia, recent acute blood loss anemia secondary to surgery, recent UTI prior to surgery treated with Cipro. Postoperative hyponatremia.  Postoperative atrial fibrillation.  ALLERGIES:  CORGARD, VERAPAMIL, SULFA and SHELLFISH.  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  She denies tobacco use.  She is married.  FAMILY HISTORY:  Father died from MI age at 50.  Mother died at age 56 and did have a history of coronary artery disease with previous bypass surgery in the 1970s.  PHYSICAL EXAMINATION:  VITAL SIGNS:  Blood pressure is 112/68, pulse 91, respirations 20, oxygen saturation is 99% on room air, temperature 97.2. GENERAL APPEARANCE:  This is a well-developed adult female who appears ill. HEENT:  Unremarkable. PULMONARY:  Clear breath sounds bilaterally. CARDIAC:  Regular rate and rhythm.  Normal S1 and S2 without murmurs, gallops or rubs. ABDOMEN:  Marked distention, scant bowel sounds.  No obvious organomegaly.  Cannot rule out ascites. NECK:  Supple.  No jugular venous distention.  No carotid bruits.  No lymphadenopathy. EXTREMITIES:  Warm.  There is bilateral lower extremity edema, pitting. Pulses are palpable. RECTAL:  Deferred. GENITOURINARY:  Deferred. SKIN:  No obvious rashes or lesions. NEUROLOGIC:  Grossly nonfocal although she does appear quite weak but generalized.  ASSESSMENT:  [name] is requiring readmission as described  above, primarily primary factors include recurrent clostridium difficile as well as failure to thrive.  Plan is as described above to obtain consultations from Gastroenterology as well as Infectious Disease and pending results of their evaluation as well as testing, both laboratory and radiological.  The plan will be further delineated.     John Giovanni, P.A.-C.   ______________________________ Valentina Gu Roxy Manns, M.D.    Loren Racer  D:  12/16/2010  T:  12/17/2010  Job:  [id number]  cc:   Loralie Champagne, MD Tacey Ruiz, MD [facility]  Electronically Signed by Jadene Pierini P.A.-C. on 12/21/2010 02:19:34 PM Electronically Signed by Darylene Price M.D. on 12/23/2010 04:33:09 PM

## 2010-12-24 LAB — DIFFERENTIAL
Basophils Relative: 0 % (ref 0–1)
Eosinophils Relative: 0 % (ref 0–5)
Lymphocytes Relative: 12 % (ref 12–46)
Neutrophils Relative %: 83 % — ABNORMAL HIGH (ref 43–77)

## 2010-12-24 LAB — GLUCOSE, CAPILLARY
Glucose-Capillary: 111 mg/dL — ABNORMAL HIGH (ref 70–99)
Glucose-Capillary: 129 mg/dL — ABNORMAL HIGH (ref 70–99)

## 2010-12-24 LAB — BASIC METABOLIC PANEL
BUN: 17 mg/dL (ref 6–23)
CO2: 25 mEq/L (ref 19–32)
Chloride: 105 mEq/L (ref 96–112)
Creatinine, Ser: <0.47 mg/dL — ABNORMAL LOW (ref 0.50–1.10)
Glucose, Bld: 80 mg/dL (ref 70–99)

## 2010-12-24 LAB — CULTURE, BLOOD (ROUTINE X 2)
Culture  Setup Time: 201208050051
Culture: NO GROWTH

## 2010-12-24 LAB — CBC
HCT: 27.4 % — ABNORMAL LOW (ref 36.0–46.0)
Hemoglobin: 9 g/dL — ABNORMAL LOW (ref 12.0–15.0)
MCV: 83.8 fL (ref 78.0–100.0)
RBC: 3.27 MIL/uL — ABNORMAL LOW (ref 3.87–5.11)
WBC: 9.4 10*3/uL (ref 4.0–10.5)

## 2010-12-24 LAB — MAGNESIUM: Magnesium: 1.8 mg/dL (ref 1.5–2.5)

## 2010-12-24 NOTE — Consult Note (Signed)
NAMESHIVON, HACKEL NO.:  [id number]  MEDICAL RECORD NO.:  [id number]  LOCATION:                                 FACILITY:  PHYSICIAN:  Thayer Headings, MD    DATE OF BIRTH:  May 21, 1940  DATE OF CONSULTATION: DATE OF DISCHARGE:                                CONSULTATION   CONSULTING PHYSICIAN:  Valentina Gu. Roxy Manns, MD.  REASON FOR CONSULTATION:  Clostridium difficile infection.  HISTORY OF PRESENT ILLNESS:  This is a 70 year old female, recently hospitalized for severe CAD, requiring CABG, who had a complication of Clostridium difficile infection during the hospitalization at that time, and received treatment with both vancomycin and Flagyl.  Her diarrhea had resulted at discharge, however, about 1-2 days after discharge, she began to have symptoms of diarrhea again.  She also reports a "bloated" feeling as well.  She also endorses a fever up to 101 the day prior to this admission.  She has also had some difficulty with her energy level and swelling.  There was an apparent 30-pound weight gain of fluid since her hospitalization.  She did start Flagyl as an outpatient, however, she says she had no benefit.  She otherwise denies any pain.  She does endorse some subjective chills.  It was also noted that she had mild thrombocytopenia at discharge and her LFTs were mildly elevated with an AST of 58.  She now presents with notable LFT elevation of 61 and 69 respectively.  Her white count is otherwise normal.  PAST MEDICAL HISTORY: 1. Coronary artery disease status post CABG. 2. Hypertension. 3. Hyperlipidemia. 4. History of appendectomy. 5. History of TAH. 6. History of lysis. 7. History of pericarditis in the 1980s. 8. GERD. 9. Thrombocytopenia. 10.Hypoalbuminemia. 11.Postoperative atrial fibrillation. 12.Postoperative hyponatremia.  MEDICATIONS: 1. Vancomycin p.o. 125 mg every 6 hours. 2. Toprol 25 mg daily. 3. Aspirin 325 mg  daily.  ALLERGIES: 1. SULFA. 2. NADOLOL. 3. VERAPAMIL.  SOCIAL HISTORY:  She denies any history of tobacco use.  FAMILY HISTORY:  Parents dead.  Father did have an MI in 18s.  REVIEW OF SYSTEMS:  A 12-point review of systems is negative except as per the history of present illness.  PHYSICAL EXAMINATION:  VITAL SIGNS:  Temperature is 99.4, pulse is 91, respirations 20-25, blood pressure is 113/44, and O2 sats 98% on room air. GENERAL:  The patient is awake, alert, and oriented x3. CARDIOVASCULAR:  Regular rate and rhythm, 2/6 systolic ejection murmur. LUNGS:  Clear to auscultation bilaterally. ABDOMEN:  Soft, is distended, and appears to have anasarca, is tender diffusely and mildly.  No rebound or guarding. EXTREMITIES:  With edema.  LABS:  Show sodium is 127, creatinine 0.6.  WBC is 7.3, platelet 67, hemoglobin 9.4.  TSH is 2.8.  C. diff has not yet been sent.  Previously C. diff was positive on July 16.  Abdominal x-ray does show small bowel mucosal thickening consistent with gastritis and also gaseous distention.  Chest x-ray with small bilateral pleural effusions.  ASSESSMENT AND PLAN:  Clostridium difficile infection.  Although, her diarrhea had resolved, previously has returned.  I do suspect this Clostridium difficile.  She has restarted on vancomycin.  I would treat her for 14 days with vancomycin.  Clostridium difficile assay has been ordered otherwise.     Thayer Headings, MD     RWC/MEDQ  D:  12/17/2010  T:  12/17/2010  Job:  [id number]  Electronically Signed by Scharlene Gloss MD on 12/24/2010 08:35:53 PM

## 2010-12-24 NOTE — Op Note (Signed)
[name], [name] NO.:  [id number]  MEDICAL RECORD NO.:  [id number]  LOCATION:  2101                         FACILITY:  [facility]  PHYSICIAN:  [name]. [name], MD     DATE OF BIRTH:  November 04, 1940  DATE OF PROCEDURE:  12/18/2010 DATE OF DISCHARGE:                              OPERATIVE REPORT   PREOPERATIVE DIAGNOSES: 1. Clostridium difficile colitis. 2. Free air.  POSTOPERATIVE DIAGNOSES: 1. Sigmoid colon perforation. 2. Clostridium colitis.  PROCEDURES: 1. Exploratory laparotomy. 2. Sigmoid colectomy with end-colostomy [name] procedure)  SURGEON:  [name].  [name], MD  ASSISTANT SURGEON:  Fenton Malling. Lucia Gaskins, MD  ANESTHESIA:  General.  SPECIMENS: 1. Peritoneal fluid. 2. Sigmoid colon stitch marked proximal margin.  ESTIMATED BLOOD LOSS:  200 mL.  FLUIDS:  Crystalloid 2200 mL, 1-10 pack of platelets, 2 units of FFP.  700 mL of urine output.  FINDINGS:  The patient had definite free air.  There was a large rush of air as we opened the fascia.  There was 2 L of purulent ascites throughout her abdominal cavity.  The colon was viable.  There was no evidence of toxic megacolon.  There was a large perforation in the distal sigmoid colon.  There was evidence of pseudomembranes within the colon consistent with C. diff colitis.  I ended up transecting distal to the area of perforation and leaving a rectal stump which was tacked with 2-0 Prolene.  I ended up diverting her proximally by creating an end- colostomy.  The fascia was closed in a continuous fashion with internal retention sutures consisting of interrupted Novafil.  INDICATIONS FOR PROCEDURE:  The patient is a very pleasant 70 year old Caucasian female who underwent urgent cardiac bypass surgery several weeks ago.  Her postoperative course was complicated by atrial fibrillation and C. diff colitis.  Her C. diff colitis resolved after treatment for over a week.  She was discharged to home.  She  represented to her cardiothoracic surgeon's offices past Friday with malaise, diarrhea, and generalized abdominal pain.  She was admitted and started on treatment for presumptive C. diff colitis including IV Flagyl, oral vancomycin.  However, she was also added Zosyn and vancomycin yesterday as well.   She continued to have fevers and a CT scan was ordered today which demonstrated fair amount of free air.  She had peritonitis on exam.  Based on the findings of free intraabdominal air and her worsening clinical abdominal exam, we discussed operative versus nonoperative management.  She was thrombocytopenic, anemic, along with severe protein-calorie malnutrition.  Her prealbumin was 8, her albumin was around 1.6.  I explained to the family and the patient that she was at high risk for perioperative complications including injury to surrounding structures, given her previous abdominal surgery, blood clot formation, a staple line leak, incisional hernia, enterocutaneous fistula formation, abscess formation, potential need to leave her abdomen open and return to the operating room, potential need for a total abdominal colectomy, renal failure,  respiratory failure, prolonged hospitalization, and potentially death.  The patient elected to proceed to the operating room.  DESCRIPTION OF PROCEDURE:  The patient was taken urgently to the operating room and placed supine on the operating  room table.  General endotracheal anesthesia was established.  Her abdomen was prepped and draped in the usual standard surgical fashion.  She was on therapeutic antibiotics.  A surgical time-out was performed.  Her abdomen was prepped and draped in usual standard surgical fashion.  A midline incision was made with a #10 blade.  Subcutaneous tissue was divided with electrocautery.  There was a fair amount of edema in the subcutaneous tissue consistent with anasarca.  The abdominal cavity was entered and there was  a large release of the air.  I extended the fascial opening several inches above the umbilicus and almost all the way down to her pubic bone.  An Blackford wound protector was placed. However, there was already frank spillage of tremendous volumes of lightly purulent ascites.  We suctioned out over 2 L of ascites.  The transverse and right colon appeared viable.  It was not hyperemic.  It did not appear consistent with toxic megacolon.  I ran the small bowel from the ligament of Treitz all the way down to the cecum.  It was viable.  There was no evidence of any perforation or defect within the small bowel.  I lifted up on the sigmoid colon and there was a large perforation in the distal sigmoid colon.  The sigmoid colon was redundant and somewhat stuck and adhered to her left pelvic sidewall. She still had part of her uterus and tubes in place, at least I could visualize her right ovary.  The uterus was slightly boggy.  A Balfour retractor was placed.  I started taking down the lateral attachments of  the sigmoid colon, at the pelvic inlet.  This was done in both blunt fashion as well as with electrocautery.  The small bowel was packed up into the upper abdomen with several lap pads.  I decided to transect the sigmoid colon about 4-6 inches upstream from the area of perforation. It should be noted that the peritoneum overlying the colon was very inflamed and thickened.  A small rent was made in the mesentery in the area of the proximal sigmoid colon.  I was able to take a linear cutter GIA stapler with a 75-mm blue load and transect the proximal colon.  I then took down the sigmoid colon mesentery in a serial fashion using the LigaSure device.  I stayed close to the colon.  I took down the lateral sidewall attachments both on the left and on the right.  There were some adhesions to the uterus that I had to take down with electrocautery.  As I was mobilizing the distal sigmoid colon, I ligated  with a 2-0 tie what appeared to be the superior rectal vascular pedicle.  I was able to take down the mesentery further down into the pelvis with the LigaSure device.  I was able to get below the area of perforation and up and out the mesentery around the distal sigmoid upper rectum where I planned to transect.  At this point, we were in the upper rectal area.  I was able to take 60 mm Echelon stapler and fire across the rectum with two fires of the Echelon stapler with a green load.  This freed the sigmoid colon. A stitch was placed on the proximal margin.  Looking through the defect within the wall of the colon, I could appreciate the pseudomembranes from the C. diff.  We had stayed close to the colon through our dissection.  At this point, I confirmed that the ureter  was intact and we identified the ureter.  It was viable and it was down and out the way.  I continued to mobilize some of the descending colon so that it would come up through the abdominal wall easily.  At this point, I tacked the rectal stump with two 2-0 Prolene.  We then irrigated the abdomen with 7 L of saline.  At this point, I decided to make the skin defect and the fascial defect for the ostomy in the left lower quadrant. A 1-1/2 inch circular incision was made in the left midabdomen several inches to the left of the midline below the level of the umbilicus.  A wedge of subcutaneous tissue fat was excised.  I then made a cruciate incision in the fascia as I was unable to pass two fingers through the fascial defect.  Using a Babcock, we brought out the end colon out through the defect, ensuring that it was not twisted and it was not overtly tight.  I then pexied the colon to the abdominal wall internally with two 2-0 silk sutures.  The end colon appeared viable.  At this point, I placed a piece of Seprafilm over the rectal stump.  The small bowel was returned to the abdominal cavity.  All lap pads were removed. The  Colon Flattery was removed.  I placed several pieces of Seprafilm in the midline.  I then reapproximated the fascia with running looped #1 PDS one from above and one from below with interrupted internal retention sutures consisting of #1 Novafil.  Because of the gross contamination, I elected to leave the skin open.  There were three staples placed around the umbilicus to bring the skin together at the umbilicus.  At this point, I placed a blue bowel over the midline.  I then transected the distal staple line and matured the colostomy.  I removed about 1 cm of the colon just below the staple line.  I then matured the ostomy with interrupted 3-0 Vicryl sutures.  I Brooke'd the colostomy at the 12 o'clock, 6 o'clock, 3 o'clock, and 9 o'clock positions and I placed several interrupted 3-0 Vicryls in between those sutures.  The ostomy device was applied.  Kerlix were placed in the midline followed by dry gauze, ABD pad.  All needle, instrument, and sponge counts were correct x2.  The patient tolerated the procedure well.  She was not on any vasopressors and taken to the ICU in stable condition.     [name]. [name], MD     EMW/MEDQ  D:  12/18/2010  T:  12/19/2010  Job:  [id number]  cc:   Dr. Andre Lefort, MD,FACG Tacey Ruiz Loralie Champagne, MD Scharlene Gloss  Electronically Signed by [name] M.D. on 12/24/2010 12:18:16 PM

## 2010-12-25 LAB — COMPREHENSIVE METABOLIC PANEL
ALT: 29 U/L (ref 0–35)
AST: 26 U/L (ref 0–37)
Albumin: 1.4 g/dL — ABNORMAL LOW (ref 3.5–5.2)
CO2: 26 mEq/L (ref 19–32)
Calcium: 7.2 mg/dL — ABNORMAL LOW (ref 8.4–10.5)
Sodium: 131 mEq/L — ABNORMAL LOW (ref 135–145)
Total Protein: 4.6 g/dL — ABNORMAL LOW (ref 6.0–8.3)

## 2010-12-25 LAB — CBC
MCH: 27.2 pg (ref 26.0–34.0)
Platelets: 136 10*3/uL — ABNORMAL LOW (ref 150–400)
RBC: 3.2 MIL/uL — ABNORMAL LOW (ref 3.87–5.11)

## 2010-12-25 LAB — GLUCOSE, CAPILLARY: Glucose-Capillary: 141 mg/dL — ABNORMAL HIGH (ref 70–99)

## 2010-12-26 LAB — CBC
Hemoglobin: 8.7 g/dL — ABNORMAL LOW (ref 12.0–15.0)
MCH: 27.5 pg (ref 26.0–34.0)
MCV: 83.5 fL (ref 78.0–100.0)
RBC: 3.16 MIL/uL — ABNORMAL LOW (ref 3.87–5.11)

## 2010-12-26 LAB — COMPREHENSIVE METABOLIC PANEL
AST: 25 U/L (ref 0–37)
Albumin: 1.4 g/dL — ABNORMAL LOW (ref 3.5–5.2)
Alkaline Phosphatase: 207 U/L — ABNORMAL HIGH (ref 39–117)
BUN: 9 mg/dL (ref 6–23)
Total Protein: 4.6 g/dL — ABNORMAL LOW (ref 6.0–8.3)

## 2010-12-26 LAB — GLUCOSE, CAPILLARY
Glucose-Capillary: 110 mg/dL — ABNORMAL HIGH (ref 70–99)
Glucose-Capillary: 93 mg/dL (ref 70–99)
Glucose-Capillary: 127 mg/dL — ABNORMAL HIGH (ref 70–99)
Glucose-Capillary: 114 mg/dL — ABNORMAL HIGH (ref 70–99)
Glucose-Capillary: 155 mg/dL — ABNORMAL HIGH (ref 70–99)
Glucose-Capillary: 110 mg/dL — ABNORMAL HIGH (ref 70–99)

## 2010-12-27 LAB — WOUND CULTURE

## 2010-12-27 LAB — GLUCOSE, CAPILLARY
Glucose-Capillary: 108 mg/dL — ABNORMAL HIGH (ref 70–99)
Glucose-Capillary: 106 mg/dL — ABNORMAL HIGH (ref 70–99)
Glucose-Capillary: 111 mg/dL — ABNORMAL HIGH (ref 70–99)
Glucose-Capillary: 127 mg/dL — ABNORMAL HIGH (ref 70–99)

## 2010-12-27 LAB — BASIC METABOLIC PANEL
BUN: 7 mg/dL (ref 6–23)
CO2: 25 mEq/L (ref 19–32)
Chloride: 103 mEq/L (ref 96–112)
Creatinine, Ser: 0.47 mg/dL — ABNORMAL LOW (ref 0.50–1.10)
Glucose, Bld: 112 mg/dL — ABNORMAL HIGH (ref 70–99)

## 2010-12-28 LAB — GLUCOSE, CAPILLARY
Glucose-Capillary: 100 mg/dL — ABNORMAL HIGH (ref 70–99)
Glucose-Capillary: 128 mg/dL — ABNORMAL HIGH (ref 70–99)
Glucose-Capillary: 175 mg/dL — ABNORMAL HIGH (ref 70–99)
Glucose-Capillary: 102 mg/dL — ABNORMAL HIGH (ref 70–99)

## 2010-12-29 DIAGNOSIS — A0472 Enterocolitis due to Clostridium difficile, not specified as recurrent: Secondary | ICD-10-CM

## 2010-12-29 LAB — GLUCOSE, CAPILLARY
Glucose-Capillary: 207 mg/dL — ABNORMAL HIGH (ref 70–99)
Glucose-Capillary: 111 mg/dL — ABNORMAL HIGH (ref 70–99)

## 2010-12-30 ENCOUNTER — Inpatient Hospital Stay (HOSPITAL_COMMUNITY): Payer: Medicare Other

## 2010-12-30 LAB — CBC
MCV: 84.8 fL (ref 78.0–100.0)
Platelets: 240 10*3/uL (ref 150–400)
RBC: 2.9 MIL/uL — ABNORMAL LOW (ref 3.87–5.11)
RDW: 17.8 % — ABNORMAL HIGH (ref 11.5–15.5)
WBC: 6 10*3/uL (ref 4.0–10.5)

## 2010-12-30 LAB — BASIC METABOLIC PANEL
CO2: 25 mEq/L (ref 19–32)
Chloride: 104 mEq/L (ref 96–112)
Creatinine, Ser: <0.47 mg/dL — ABNORMAL LOW (ref 0.50–1.10)
Potassium: 3.1 mEq/L — ABNORMAL LOW (ref 3.5–5.1)
Sodium: 134 mEq/L — ABNORMAL LOW (ref 135–145)

## 2010-12-30 LAB — DIFFERENTIAL
Basophils Absolute: 0 10*3/uL (ref 0.0–0.1)
Eosinophils Absolute: 0.1 10*3/uL (ref 0.0–0.7)
Lymphs Abs: 1.9 10*3/uL (ref 0.7–4.0)
Neutrophils Relative %: 56 % (ref 43–77)

## 2010-12-30 LAB — PRO B NATRIURETIC PEPTIDE: Pro B Natriuretic peptide (BNP): 781.6 pg/mL — ABNORMAL HIGH (ref 0–125)

## 2010-12-31 LAB — BASIC METABOLIC PANEL
CO2: 25 mEq/L (ref 19–32)
Chloride: 106 mEq/L (ref 96–112)
Potassium: 4.4 mEq/L (ref 3.5–5.1)
Sodium: 134 mEq/L — ABNORMAL LOW (ref 135–145)

## 2011-01-02 ENCOUNTER — Encounter: Payer: Self-pay | Admitting: Cardiology

## 2011-01-04 ENCOUNTER — Encounter: Payer: Medicare Other | Admitting: Cardiology

## 2011-01-05 NOTE — Discharge Summary (Signed)
[name], [name]             ACCOUNT NO.:  [id number]  MEDICAL RECORD NO.:  [id number]  LOCATION:  6                         FACILITY:  [facility]  PHYSICIAN:  Valentina Gu. Roxy Manns, M.D. DATE OF BIRTH:  12-19-1940  DATE OF ADMISSION:  12/16/2010 DATE OF DISCHARGE:                              DISCHARGE SUMMARY   FINAL DIAGNOSES:  Sigmoid colon perforation.  Clostridium colitis.  IN-HOSPITAL DIAGNOSES: 1. Anasarca. 2. Hypoalbuminemia. 3. Hyponatremia. 4. Elevated LFTs. 5. Acute blood loss anemia. 6. Thrombocytopenia. 7. Right pleural effusion. 8. Pseudomonas wound infection. 9. Deconditioning.  SECONDARY DIAGNOSES: 1. Severe three-vessel coronary artery disease, status post coronary     artery bypass grafting x3 on November 25, 2010. 2. Hypertension. 3. Hyperlipidemia. 4. Status post appendectomy. 5. Status post unilateral oophorectomy and salpingectomy. 6. History of exploratory laparotomy for lysis of adhesions. 7. History of hemorrhoidectomy x2. 8. History of left breast biopsy. 9. History of pericarditis in the 1980s. 10.History of gastroesophageal reflux disease. 11.History of palpitations with Holter monitor in 2003. 12.Postoperative Clostridium difficile colitis. 13.Postoperative thrombocytopenia. 14.Recent acute blood loss anemia, secondary to surgery. 15.Recent urinary tract infection prior to surgery, treated with     ciprofloxacin. 16.Postoperative hyponatremia. 17.Postoperative atrial fibrillation.  IN-HOSPITAL OPERATIONS AND PROCEDURES:  Exploratory laparotomy with sigmoid colectomy with end colostomy [name] procedure) done by Dr. [name] on December 18, 2010.  HISTORY AND PHYSICAL AND HOSPITAL COURSE:  The patient is a 70 year old female who was recently placed to on November 21, 2010, to December 07, 2010, for severe coronary artery disease which she underwent coronary artery bypass grafting for by Dr. Roxy Manns on November 25, 2010.  She had a prolonged difficulty  postoperative course related to diarrhea secondary to Clostridium difficile infection.  She was treated aggressively with both vancomycin as well as Flagyl.  She was feeling well at the time of discharge with resolution of her diarrhea.  Recently, the patient has had recurrence of diarrhea.  On December 13, 2010, she was restarted on Flagyl by Dr. Cyndia Bent.  She presented to the office on December 16, 2010. The patient has been feeling very poorly.  She has had increased abdominal distention.  She was also noted to be having fevers.  She has had significant fluid retention as well.  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  For further details of the patient's past medical history and physical exam, please see dictated H and P.  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 Infectious Disease was consulted on admission.  Infectious Disease recommended on admission vancomycin IV for a total of 14-21 days.  GI also saw the patient on admission and agreed with Infectious Disease continuing vancomycin.  Both of them saw her during the hospitalization. Cardiology was also consulted on admission.  On admission, the patient was noted to have hypoalbuminemia as well as elevated LFTs and hyponatremia.  A 2-D echocardiogram was done on admission, which showed ejection fraction of 55-60%.  Systolic function was normal.  Wall motion was normal.  There was no aortic stenosis or regurgitation.  There was no mitral regurgitation.  There was no pericardial effusion.  Cardiology planned to follow during the patient's hospitalization and recommended aggressive nutritional support as well as IV Lasix for her anasarca. Abdominal film was obtained on admission showing mucosal thickening involving loops of small  bowel and the left midabdomen.  There was gaseous distention of the loops of bowel in the lower abdomen.  They recommended CT scan.  Film also showed bilateral pleural effusions.  CT scan was done on December 18, 2010.  This showed antral perforation with moderate free intraperitoneal air and ascites.  There was thickened colonic wall consistent with history of C. difficile colitis.  General Surgery has been consulted and reviewed the patient's CT scan.  The patient was seen by Dr. [name] on December 18, 2010, and discussed CT scan results with the patient.  He felt that the patient should be taken to the operating room for repair of the perforation.  He discussed risks and benefits with the patient.  The patient acknowledged understanding and agreed to proceed.  The patient was taken to the operating room on December 18, 2010, by Dr. [name] where she underwent exploratory laparotomy and sigmoid colectomy with end colostomy [name] procedure).  The patient tolerated this procedure well and was transferred to the Surgical Intensive Care Unit in stable condition and remained intubated. On admission to Surgical Intensive Care Unit, Critical Care was consulted.  Infectious Disease, Cardiology, and GI continued to follow the patient postoperatively.  Infectious Disease had placed the patient on appropriate antibiotics.  Her hyponatremia was improving as well as her hypoalbuminemia.  She did receive transfusions for acute blood loss anemia postoperatively.  On postop day #1, Nutrition was consulted and the patient was started on TPN.  The patient was able to be extubated on postop day #2.  Postextubation, the patient was noted to be alert and oriented x4, neuro intact.  Postextubation, the patient was continued on TPN.  General Surgery slowly started the patient back on diet.  Prior to discharge, the patient was tolerating regular diet.  She was encouraged to drink supplemental shakes as well.  TPN was  ultimately discontinued. Wound Care was consulted for management of the patient's new colostomy and for instructions on management.  Colostomy was managed appropriately during her hospitalization.  Postoperatively, the patient started having purulent drainage from her laparotomy incision.  On closure, her skin layer was not closed and was left open and planned for secondary closure.  The purulent drainage was cultured and grew out Pseudomonas. Infectious disease started the patient on appropriate antibiotics.  This site continued to have significant drainage.  It was felt that the patient may benefit from a wound VAC.  This was applied.  Wound Care was also consulted for management of wound VAC.  It was felt that the patient would benefit from a wound VAC at home.  Case Management made these arrangements.  Postoperatively, Physical Therapy and Occupational Therapy were consulted.  The patient was noted to have deconditioning postoperatively.  She was up ambulating and progressing well with PT and OT prior to discharge.  Postoperatively, vital signs were followed.  The patient did have postoperative atrial fibrillation following her coronary artery bypass grafting.  She has remained in normal sinus rhythm at times with sinus tachycardia.  Cardiology was following.  Her amiodarone had been discontinued on admission secondary to elevated LFTs.  These were monitored and improving following discontinuation of amiodarone.  Amiodarone was not restarted prior to discharge.  On admission, the patient was noted to have bilateral pleural effusions. Also noted to have significant anasarca.  She had been started on IV Lasix.  Her edema was improving.  Most recent chest x-ray obtained on December 23, 2010, showed resolving pleural effusions with mild basilar atelectasis.  The patient was weaned off oxygen to maintain O2 saturations greater than 90% on room air.  Prior to discharge home, Infectious  Disease recommended continue the patient on cefepime while in hospital and then discontinued and changed over to ciprofloxacin 500 mg b.i.d. for a total of 3 days.  This was for treatment of her Pseudomonas and wound infection.  They also recommended to continue the patient on vancomycin 500 mg q.i.d. through January 16, 2011, and continue her on Florastor 250 mg b.i.d.  General Surgery felt that the patient will be ready for discharge to home in the a.m. of December 30, 2010, with wound VAC.  The patient's most recent lab work shows BNP of 781, sodium of 134, potassium 3.1, chloride 104, bicarbonate 25, BUN of 6, creatinine 0.47, and glucose 128.  White blood cell count 6.0, hemoglobin 7.9, hematocrit 24.6, and platelet count 240.  The patient remained stable from a general surgical standpoint.  She will be ready for discharge to home in the a.m.  FOLLOWUP APPOINTMENTS:  An appointment will need to be arranged with Dr. [name] for 1-2 weeks.  Their office will arrange this for the patient. The patient has an appointment to see Dr. Roxy Manns on January 23, 2011, at 2:45 p.m.  Home health has been arranged for the patient for wound VAC management.  This will also be managed per Dr. [name].  The patient will need to follow up with Dr. Aundra Dubin in the next 2-4 weeks.  She will need to contact her office to make these arrangements.  ACTIVITY:  The patient is instructed to continue ambulating 3-4 times per day.  Physical therapy has been arranged for home.  She is also instructed no driving until released to do so, no heavy lifting over 10 pounds.  INCISIONAL CARE:  This is per General Surgery and they have discussed this with the patient.  WOUND CARE:  VAC management per General Surgery as well.  DIET:  The patient is instructed on low-fat, low-salt diet with nutritional supplement.  DISCHARGE MEDICATIONS: 1. Aspirin chewable 81 mg daily. 2. Ciprofloxacin 500 mg b.i.d. x3 days. 3. Dilaudid  2 mg q.4 h. p.r.n. pain. 4. Nutritional supplement t.i.d. 5. Florastor 250 mg b.i.d. 6. Vancomycin 500 mg q.6 h. for 17 days. 7. Calcium carbonate/vitamin D 600 mg daily. 8. Multivitamin daily. 9. Toprol-XL 25 mg daily. 10.Nexium 40 mg daily. 11.Vitamin C 500 mg daily.     Iverson Alamin, PA   ______________________________ Valentina Gu. Roxy Manns, M.D.    KMD/MEDQ  D:  12/30/2010  T:  12/30/2010  Job:  [id number]  cc:   [name]. [name], MD Fransico Him, M.D. Alcide Evener, MD Elayne Snare, M.D.  Electronically Signed by Harrel Lemon PA on 01/02/2011 10:50:25 AM Electronically Signed by Darylene Price M.D. on 01/05/2011 10:42:25 AM

## 2011-01-06 ENCOUNTER — Ambulatory Visit (INDEPENDENT_AMBULATORY_CARE_PROVIDER_SITE_OTHER): Payer: Medicare Other | Admitting: General Surgery

## 2011-01-06 ENCOUNTER — Encounter (INDEPENDENT_AMBULATORY_CARE_PROVIDER_SITE_OTHER): Payer: Self-pay | Admitting: General Surgery

## 2011-01-06 VITALS — BP 114/60 | HR 88 | Temp 99.5°F | Ht 66.5 in | Wt 148.8 lb

## 2011-01-06 DIAGNOSIS — Z5189 Encounter for other specified aftercare: Secondary | ICD-10-CM

## 2011-01-06 DIAGNOSIS — Z4889 Encounter for other specified surgical aftercare: Secondary | ICD-10-CM

## 2011-01-06 NOTE — Progress Notes (Signed)
Chief complaint: Postop  Procedure: Status post emergency exploratory laparotomy with sigmoid colectomy and end colostomy for perforated C. difficile colitis December 18, 2010  History of Present Ilness: 70 year old Caucasian female comes in today for her first postoperative appointment. She was rehospitalized after her urgent coronary artery bypass graft surgery. She was readmitted on August 3 and discharged on August 18. She had recurrent C. difficile colitis and ultimately perforated her colon requiring emergency surgery.  Since discharge, she states that she's been doing much better. She denies any fever or chills. She denies any abdominal pain. However she is still taking one Dilaudid tablet every morning. She denies any problems with her colostomy. She reports an improving appetite. She states that her energy level is improving as well. She finished her IV antibiotics this past Tuesday and is interested to know if I can remove her PICC line. She has a wound VAC applied to her midline incision. Home health is changing her wound VAC 3 days a week. She is also getting home health physical therapy and occupational therapy  Physical Exam: BP 114/60  Pulse 88  Temp(Src) 99.5 F (37.5 C) (Temporal)  Ht 5' 6.5" (1.689 m)  Wt 148 lb 12.8 oz (67.495 kg)  BMI 23.66 kg/m2  Well-developed well-nourished Caucasian female in no apparent distress. She looks 100% better than last time I saw her when she was in the hospital. She is sitting in a wheelchair Pulmonary-lungs are clear Cardiac-regular rate and rhythm Abdomen-soft, nontender, nondistended. There is a wound VAC in the midline. Ostomy in the left mid abdomen. Stool and air in the ostomy bag. No signs of incisional hernia. Right lower extremity-in the distal medial thigh she has a small incision where the skin is separated. There is no cellulitis there is trace tenderness exudate in the base of the wound. The depth of the wound is about 2  mm. Lower extremities-2+ pitting edema up to her mid calves   Assessment and Plan: Status post emergency exploratory laparotomy with sigmoid colectomy and end colostomy for perforated C. difficile colitis-doing much better.  We're going to continue the wound vac  for now. I encouraged her to eat a well-balanced diet. She is to continue with physical therapy and occupational therapy.  I will see her in 4 weeks

## 2011-01-06 NOTE — Patient Instructions (Signed)
Continue wound vac therapy for now. Continue PT/OT Eat well balanced diet.

## 2011-01-07 NOTE — Discharge Summary (Signed)
  [name], [name]             ACCOUNT NO.:  [id number]  MEDICAL RECORD NO.:  [id number]  LOCATION:  59                         FACILITY:  [facility]  PHYSICIAN:  Valentina Gu. Roxy Manns, M.D. DATE OF BIRTH:  [birth year]  DATE OF ADMISSION:  12/16/2010 DATE OF DISCHARGE:  12/31/2010                              DISCHARGE SUMMARY   ADDENDUM:  [name] has continued to make good overall progress. There has been one change to her medication regimen at discharge.  Per the Infectious Disease group, she will not be discharged on Cipro.  She will have an additional 3 days of cefepime 2 grams IV b.i.d. and then discontinue.  Her other medications are as previously dictated.  For full details of this hospitalization, please see the previously dictated discharge summary.  There are no other changes at this time.     John Giovanni, P.A.-C.   ______________________________ Valentina Gu Roxy Manns, M.D.    Loren Racer  D:  12/31/2010  T:  12/31/2010  Job:  [id number]  cc:   Valentina Gu. Roxy Manns, M.D. [name]. [name], MD Alcide Evener, MD Loralie Champagne, MD Gatha Mayer, MD,FACG  Electronically Signed by Jadene Pierini P.A.-C. on 01/05/2011 01:50:21 PM Electronically Signed by Darylene Price M.D. on 01/07/2011 11:54:28 AM

## 2011-01-10 NOTE — Cardiovascular Report (Signed)
[name], [name]             ACCOUNT NO.:  [id number]  MEDICAL RECORD NO.:  [id number]  LOCATION:  2926                         FACILITY:  [facility]  PHYSICIAN:  Shaune Pascal. Nature Vogelsang, MDDATE OF BIRTH:  April 28, 1941  DATE OF PROCEDURE:  11/21/2010 DATE OF DISCHARGE:                           CARDIAC CATHETERIZATION   PRIMARY CARE PHYSICIAN:  Dr. Tacey Ruiz at [facility].  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  She has been intolerant of STATINS.  She was admitted with non- ST-elevation myocardial infarction.  She is brought for cardiac catheterization.  PROCEDURES PERFORMED: 1. Selective coronary angiography. 2. Left heart catheterization. 3. Left ventriculogram.  DESCRIPTION OF PROCEDURE:  The risks and indication were explained. Consent was signed and placed on the chart.  After ensuring a normal Allen test in the right arm, the right wrist area was prepped and draped in a routine sterile fashion and anesthetized with 1% local lidocaine. A 5-French arterial sheath was placed in the right radial artery using a modified Seldinger technique.  Given her allergy to VERAPAMIL, she was given 200 mcg of intraarterial nitroglycerin with lidocaine.  She was also given 3500 units of systemic heparin.  Standard catheters including a JL-3.5, a JR-4, and a straight pigtail were used.  All catheter exchanges were made over a wire.  There were no apparent complications.  HEMODYNAMICS:  Central aortic pressure 114/62 with a mean of 82.  LV pressure is 131/7.  EDP of 21.  There was no aortic stenosis on pullback.  Left main was normal.  LAD was a long vessel wrapping the apex.  It gave off 2 small diagonal branches.  There was diffuse 30% plaquing in the proximal portion.  In the midportion, there was tubular 60-70% lesion which may  have been intramyocardial.  In the distal LAD, there was an 80% lesion which was intramyocardial.  Left circumflex gave off a tiny OM-1 and the remainder of left circumflex was essentially a very large branching OM-2.  It trifurcated distally.  In the ostium of the OM-2, there was a 50% lesion.  In the midportion, there was a 40% lesion and at the trifurcation there was an 80% focal lesion.  All three branches had moderate diffuse plaquing.  Right coronary artery is a very large dominant vessel.  It has a 25% lesion proximally and then was totally occluded in the proximal to midsection.  There were significant left-to-right collaterals.  Left ventriculogram done in the RAO position showed an EF of 55%.  There was hypokinesis of the inferior wall from base to mid ventricle.  There was no significant mitral regurgitation.  ASSESSMENT: 1. Three-vessel coronary artery disease as described above. 2. Normal left ventricular ejection fraction with inferobasal     hypokinesis.  PLAN/DISCUSSION:  I reviewed the films with Dr. Lia Foyer.  Given the diffuse nature of her disease and her intolerance to STATIN drugs, we feel the best plan is likely bypass surgery.  We will have her evaluated by our surgical colleagues.     Shaune Pascal. Nader Boys, MD     DRB/MEDQ  D:  11/21/2010  T:  11/21/2010  Job:  [id number]  cc:   Rivka Barbara, MD Loralie Champagne, MD  Electronically Signed by Glori Bickers MD on 01/10/2011 05:19:17 PM

## 2011-01-10 NOTE — H&P (Signed)
NAMEMICKALA, LATON             ACCOUNT NO.:  [id number]  MEDICAL RECORD NO.:  [id number]  LOCATION:  2601                         FACILITY:  [place]  PHYSICIAN:  Shaune Pascal. Bensimhon, MDDATE OF BIRTH:  1940-10-24  DATE OF ADMISSION:  11/21/2010 DATE OF DISCHARGE:                             HISTORY & PHYSICAL   PRIMARY CARE PHYSICIAN:  Dr. Tacey Ruiz, [facility].  CARDIOLOGIST:  Loralie Champagne, MD  REASON FOR ADMISSION:  Chest pain/unstable angina.  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  At that time, catheterization showed a 40% LAD lesion, otherwise normal coronary arteries.  She was treated medically. She was referred to Dr. Aundra Dubin in March 2011 for chest pain and an atrial bigeminy on her EKG.  Given her history of mild valvular disease, an echo was repeated.  This showed an EF of 55-60% with mild mitral regurgitation.  She also underwent a Myoview which was reportedly normal though I do not have the exact results in front of me.  She states that since mid June, she has been having increasing exertional chest pain while exercising at [employer].  This was particularly worse in the past few weeks and she says it is made her stop her exercise prematurely.  She did not seek attention for this.  Today, she ate an apple and developed an allergic reaction like she had in the past from one.  She went to the [facility] Emergency Room and was treated with Benadryl and Solu-Medrol and felt like her throat tightness was better. However, while in the emergency room, she developed severe chest pain radiating to her left arm.  EKG showed nonspecific ST scooping, cardiac markers were negative with a total CK of 65 and troponin of 0.07.  We were contacted for transfer.  She was then started on heparin, IV nitroglycerin, and morphine.  Her pain was  better; however, on arrival she was complaining of 7/10 chest pain.  She was treated with increased nitroglycerin and Dilaudid and now she is chest pain-free, though she does appear somewhat anxious.  Stat EKG was repeated and there was no significant change.  We have checked blood pressures in both arms and these are also equivalent.  REVIEW OF SYSTEMS:  She denies any fevers or chills.  No nausea, vomiting.  No diarrhea.  No bright red blood per rectum.  No melena.  No focal neurologic deficits.  She is compliant with her medications. Remainder review of systems; all systems are negative except for HPI and problem list.  PROBLEM LIST: 1. Nonobstructive coronary artery disease as described above. 2. Mild valvular disease. 3. Hypertension. 4. Hyperlipidemia, recently stopped pravastatin due to intolerance. 5. History of pericarditis in the 1980s. 6. Gastroesophageal reflux disease. 7. Palpitations with Holter monitor in 2003 which showed PACs and     PVCs.  CURRENT MEDICATIONS: 1. Clonidine 0.1 mg patch change once a week. 2. Hydrochlorothiazide 25 mg a day. 3. Nexium 40 mg a day. 4. Vitamins.  ALLERGIES:  CORGARD, VERAPAMIL, SULFA, and SHELLFISH.  She did not have problem with her cardiac catheterization in 2004.  SOCIAL HISTORY:  She was previously from [place].  She now lives in [place].  She is married.  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 She denies any tobacco use.  FAMILY HISTORY:  Father died of a massive MI at age 68.  Mother died at 39.  She had coronary artery disease with bypass surgery in her 5s.  PHYSICAL EXAMINATION:  GENERAL:  She is somewhat anxious-appearing, but otherwise in no acute distress. VITAL SIGNS:  Blood pressure is 121/70 with a heart rate of 68, blood pressure in both arms is equivalent.  She is satting 100% on 2 L. RESPIRATORY:  Rate is nonlabored. HEENT:  Normal. NECK:  Supple.  There is no JVD.  Carotids are 2+ bilaterally  with bilateral radiated bruits.  There is no lymphadenopathy or thyromegaly appreciated. CARDIAC:  PMI is nondisplaced.  She has a regular rate and rhythm with a very soft systolic ejection murmur at the right sternal border.  The S2 is crisp.  There is also a soft systolic murmur at the apex. LUNGS:  Clear. ABDOMEN:  Soft, nontender, nondistended.  There is no hepatosplenomegaly or bruits appreciated.  Good bowel sounds. EXTREMITIES:  Warm with no cyanosis, clubbing or edema.  Pulses are strong.  There is no rash. NEURO:  Alert and oriented x3.  Cranial nerves II-XII are intact.  Moves all four extremities without difficulty.  Affect is mildly stressed.  LABORATORY DATA:  Cardiac markers are as stated above with a troponin of 0.07.  White count 6.8, hemoglobin 13.6, platelets of 163.  Sodium of 144, potassium 3.1, BUN 17, creatinine of 0.75.  LFTs are okay.  Chest x- ray is a low volume film, but no significant acute process.  EKG shows sinus rhythm at the rate of 70 with mild ST scooping, which is unchanged from her EKG at [facility].  ASSESSMENT: 1. Chest pain concerning for unstable angina. 2. History of nonobstructive coronary artery disease by cath in 2004     with a 40% LAD lesion.     a.     Myoview in March 2011 for recurrent chest pain was normal. 3. Soft aortic valve murmur radiating to both carotids.     a.     Echocardiogram in March 2011 without any significant aortic      stenosis. 4. Hypertension. 5. Hypokalemia.  PLAN/DISCUSSION:  Ms. Stapp symptoms are concerning for unstable angina.  We will continue her on aspirin, heparin, nitroglycerin, and beta blockade.  We are checking a stat set of cardiac markers.  We will plan cardiac catheterization early this morning if not sooner.     Shaune Pascal. Bensimhon, MD     DRB/MEDQ  D:  11/21/2010  T:  11/21/2010  Job:  [id number]  cc:   Tacey Ruiz  Electronically Signed by Glori Bickers MD on 01/10/2011  05:18:55 PM

## 2011-01-17 ENCOUNTER — Telehealth (INDEPENDENT_AMBULATORY_CARE_PROVIDER_SITE_OTHER): Payer: Self-pay

## 2011-01-17 NOTE — Telephone Encounter (Signed)
I received a call from [facility] about the wound.  She states that one of the wounds where they are applying a wound vac it has gotten smaller and the vac is not needed.  The other area still requires a vac.  She requested to switch to wet to dry dressing changes for the one area.  After consulting with Margarette Asal, CMA we gave her the ok.  The pt has f/u 9-21

## 2011-01-19 ENCOUNTER — Other Ambulatory Visit: Payer: Self-pay | Admitting: Thoracic Surgery (Cardiothoracic Vascular Surgery)

## 2011-01-19 ENCOUNTER — Ambulatory Visit (INDEPENDENT_AMBULATORY_CARE_PROVIDER_SITE_OTHER): Payer: Medicare Other | Admitting: Physician Assistant

## 2011-01-19 ENCOUNTER — Encounter: Payer: Self-pay | Admitting: Physician Assistant

## 2011-01-19 ENCOUNTER — Telehealth (INDEPENDENT_AMBULATORY_CARE_PROVIDER_SITE_OTHER): Payer: Self-pay | Admitting: General Surgery

## 2011-01-19 VITALS — BP 122/62 | HR 82 | Ht 66.0 in | Wt 138.8 lb

## 2011-01-19 DIAGNOSIS — A0472 Enterocolitis due to Clostridium difficile, not specified as recurrent: Secondary | ICD-10-CM

## 2011-01-19 DIAGNOSIS — E785 Hyperlipidemia, unspecified: Secondary | ICD-10-CM

## 2011-01-19 DIAGNOSIS — I10 Essential (primary) hypertension: Secondary | ICD-10-CM

## 2011-01-19 DIAGNOSIS — I4891 Unspecified atrial fibrillation: Secondary | ICD-10-CM

## 2011-01-19 DIAGNOSIS — D649 Anemia, unspecified: Secondary | ICD-10-CM | POA: Insufficient documentation

## 2011-01-19 DIAGNOSIS — Z933 Colostomy status: Secondary | ICD-10-CM | POA: Insufficient documentation

## 2011-01-19 DIAGNOSIS — I25119 Atherosclerotic heart disease of native coronary artery with unspecified angina pectoris: Secondary | ICD-10-CM | POA: Insufficient documentation

## 2011-01-19 DIAGNOSIS — I2510 Atherosclerotic heart disease of native coronary artery without angina pectoris: Secondary | ICD-10-CM

## 2011-01-19 DIAGNOSIS — J90 Pleural effusion, not elsewhere classified: Secondary | ICD-10-CM

## 2011-01-19 NOTE — Assessment & Plan Note (Signed)
Improved.  I have refilled her probiotic for her today.

## 2011-01-19 NOTE — Assessment & Plan Note (Signed)
She is progressing well since her bypass surgery.  She will continue on aspirin and beta blocker.  She will continue with home health physical therapy.  She will followup with Dr. Aundra Dubin in 3-4 weeks.  She will eventually transition over to outpatient cardiac rehabilitation.  She follows up with Dr. Roxy Manns next week.

## 2011-01-19 NOTE — Assessment & Plan Note (Signed)
She will followup with Dr. [name] in a couple of weeks.  She is improved.

## 2011-01-19 NOTE — Patient Instructions (Signed)
Your physician recommends that you schedule a follow-up appointment in: 3-4 weeks  Your physician recommends that you return for lab work in: today (BMP, CBC, LFT's)

## 2011-01-19 NOTE — Telephone Encounter (Signed)
Home care nurse, Renee, called to get dressing changes switched back to the wound vac. She had called two days ago to switch to wet to dry dressing changes but the wound will not keep a seal and she feels that the wound vac is still needed. I gave an okay to switch back to the wound vac and she will call if needed prior to patient's follow up appointment.

## 2011-01-19 NOTE — Progress Notes (Signed)
History of Present Illness: Primary Cardiologist:  Dr. Loralie Champagne  [name] is a 70 y.o. female who presents for post hospital follow up.   She has a history of hypertension, hyperlipidemia and CAD.  She had a nonischemic Myoview in 3/11.  She presented to [facility] 7/9 with chest pain.  She ruled in for NSTEMI.  Cardiac catheterization 7/12: Proximal LAD 30%, mid LAD 60-70%, distal LAD 80%, ostial OM2 50%, mid OM2 40%, 80% at the trifurcation, proximal RCA 25% then occluded with left to right collaterals, EF 55%.  Echocardiogram 7/12: EF 60-65%, PASP 45.  She was referred for bypass surgery.  Grafts included a LIMA-LAD, SVG-D1, SVG-PDA.  Of note, she did have an endarterectomy of the OM.  She developed postoperative atrial fibrillation and was treated with Coumadin and amiodarone.  She maintained normal sinus rhythm with this.  Unfortunately, she developed C. Difficile colitis.  She improved and was eventually discharged home 7/25.   However, she was readmitted 8/3-8/18.  She was readmitted with failure to thrive, anasarca and profound diarrhea.  She was placed on IV vancomycin.  She was diuresed for anasarca.  Relook echocardiogram demonstrated normal LV function.  CT scan demonstrated a perforated sigmoid colon and she was referred for exploratory laparotomy.  She underwent sigmoid colectomy with end colostomy [name] procedure) by Dr. [name].  She was transfused with PRBCs for acute blood loss anemia.  Her laparotomy incision became infected with Pseudomonas and this had to be closed by secondary intention.  She was placed on the wound VAC.  She had elevated LFTs and her amiodarone was discontinued.  She remained in normal sinus rhythm.  She was eventually discharged home on ciprofloxacin and vancomycin.  Overall, she is doing well.  She denies recurrent diarrhea.  Her abdominal wound is healing well.  The eventual plan is to take down her colostomy in 3-4 months.  She  denies chest discomfort.  She denies significant shortness of breath.  She is working with home health physical therapy.  She denies orthopnea, PND or significant pedal edema.  Past Medical History  Diagnosis Date  . Hypertension   . Coronary artery disease     a. NSTEMI 7/12, cath: pLAD 30%, mLAD 60-70%, dLAD 80%, oOM2 50%, mOM2 40%, trifurcation 80%, pRCA 25%, then occluded, L-R collats, EF 55%;   b. 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 Roxy Manns)  . Hyperlipidemia   . Pericarditis 1980    hx of  . GERD (gastroesophageal reflux disease)   . Palpitations 2003    hx of; Holter in 2003 with PACs and PVCs  . Atrial fibrillation     post-op after CABG;  treated with Amiodarone and coumadin until readmxn with CDiff colitis, perf colon and elevated LFTs (amio and coumadin stopped)  . C. difficile colitis 01/8920    complicated post CABG course with prolonged admission, perforated sigmoid colon;  s/p sigmoid colectomy with end colostomy [name] procedure);  laparotomy wound infection with Pseudomonas treated with antibxs and secondary intention  . 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 Surgical wound infection     Pseudomonas; wound VAC; secondary intention    Current Outpatient Prescriptions  Medication Sig Dispense Refill  . aspirin 81 MG chewable tablet Chew 81 mg by mouth daily.        . Calcium Carbonate-Vitamin D 600-400 MG-UNIT per tablet Take 1 tablet by mouth daily.        Marland Kitchen  HYDROmorphone (DILAUDID) 2 MG tablet Take 2 mg by mouth every 4 (four) hours as needed.        . metoprolol succinate (TOPROL-XL) 25 MG 24 hr tablet daily.      . Multiple Vitamin (MULTIVITAMIN) tablet Take 1 tablet by mouth daily.        . Nutritional Supplements (NUTRITIONAL SUPPLEMENT PLUS) LIQD Take 1 Can by mouth 3 (three) times daily.        Marland Kitchen saccharomyces boulardii (FLORASTOR) 250 MG capsule Take 250 mg by mouth 2 (two) times daily.        . vancomycin  (VANCOCIN) 50 mg/mL oral solution Take by mouth every 6 (six) hours.        . vitamin C (ASCORBIC ACID) 500 MG tablet Take 500 mg by mouth daily.          Allergies: Allergies  Allergen Reactions  . Nadolol   . Sulfonamide Derivatives   . Verapamil     Vital Signs: BP 122/62  Pulse 82  Ht 5' 6"  (1.676 m)  Wt 138 lb 12.8 oz (62.959 kg)  BMI 22.40 kg/m2  PHYSICAL EXAM: Well nourished, well developed, in no acute distress HEENT: normal Neck: no JVD At 90 Cardiac:  normal S1, S2; RRR; no murmur Lungs:  clear to auscultation bilaterally, no wheezing, rhonchi or rales Abd: Laparotomy wound with wound VAC in place; colostomy bag with brown stool Ext: no edema Skin: warm and dry Neuro:  CNs 2-12 intact, no focal abnormalities noted Psych: Normal affect  EKG:  Sinus rhythm, heart rate 83, normal axis, nonspecific ST-T wave changes  ASSESSMENT AND PLAN:

## 2011-01-19 NOTE — Assessment & Plan Note (Signed)
Controlled.  Obtain a basic metabolic panel today.

## 2011-01-19 NOTE — Assessment & Plan Note (Signed)
Repeat CBC today.

## 2011-01-19 NOTE — Assessment & Plan Note (Signed)
She is not currently taking a statin due to elevated LFTs.  I will repeat LFTs today.  We will eventually get her back on Crestor.

## 2011-01-20 DIAGNOSIS — Z951 Presence of aortocoronary bypass graft: Secondary | ICD-10-CM

## 2011-01-20 LAB — CBC WITH DIFFERENTIAL/PLATELET
Basophils Absolute: 0.3 10*3/uL — ABNORMAL HIGH (ref 0.0–0.1)
Eosinophils Absolute: 1.3 10*3/uL — ABNORMAL HIGH (ref 0.0–0.7)
HCT: 33.2 % — ABNORMAL LOW (ref 36.0–46.0)
Hemoglobin: 10.4 g/dL — ABNORMAL LOW (ref 12.0–15.0)
Lymphocytes Relative: 28.8 % (ref 12.0–46.0)
Lymphs Abs: 2.7 10*3/uL (ref 0.7–4.0)
MCHC: 31.4 g/dL (ref 30.0–36.0)
Monocytes Relative: 7.4 % (ref 3.0–12.0)
Neutro Abs: 4.4 10*3/uL (ref 1.4–7.7)
Platelets: 259 10*3/uL (ref 150.0–400.0)
RDW: 19.1 % — ABNORMAL HIGH (ref 11.5–14.6)

## 2011-01-20 LAB — HEPATIC FUNCTION PANEL
AST: 31 U/L (ref 0–37)
Albumin: 2.9 g/dL — ABNORMAL LOW (ref 3.5–5.2)
Alkaline Phosphatase: 110 U/L (ref 39–117)
Bilirubin, Direct: 0.1 mg/dL (ref 0.0–0.3)
Total Protein: 7 g/dL (ref 6.0–8.3)

## 2011-01-20 LAB — BASIC METABOLIC PANEL
CO2: 30 mEq/L (ref 19–32)
Calcium: 9.4 mg/dL (ref 8.4–10.5)
Chloride: 103 mEq/L (ref 96–112)
Glucose, Bld: 101 mg/dL — ABNORMAL HIGH (ref 70–99)
Potassium: 4.7 mEq/L (ref 3.5–5.1)
Sodium: 139 mEq/L (ref 135–145)

## 2011-01-23 ENCOUNTER — Encounter: Payer: Self-pay | Admitting: Thoracic Surgery (Cardiothoracic Vascular Surgery)

## 2011-01-23 ENCOUNTER — Telehealth: Payer: Self-pay | Admitting: Physician Assistant

## 2011-01-23 ENCOUNTER — Ambulatory Visit (INDEPENDENT_AMBULATORY_CARE_PROVIDER_SITE_OTHER): Payer: Self-pay | Admitting: Thoracic Surgery (Cardiothoracic Vascular Surgery)

## 2011-01-23 ENCOUNTER — Ambulatory Visit
Admission: RE | Admit: 2011-01-23 | Discharge: 2011-01-23 | Disposition: A | Discharge status: Home / Self Care | Payer: Medicare Other | Source: Ambulatory Visit | Attending: Thoracic Surgery (Cardiothoracic Vascular Surgery) | Admitting: Thoracic Surgery (Cardiothoracic Vascular Surgery)

## 2011-01-23 VITALS — BP 130/73 | HR 88 | Resp 20 | Ht 66.0 in | Wt 140.0 lb

## 2011-01-23 DIAGNOSIS — E785 Hyperlipidemia, unspecified: Secondary | ICD-10-CM

## 2011-01-23 DIAGNOSIS — J90 Pleural effusion, not elsewhere classified: Secondary | ICD-10-CM

## 2011-01-23 DIAGNOSIS — I2510 Atherosclerotic heart disease of native coronary artery without angina pectoris: Secondary | ICD-10-CM

## 2011-01-23 DIAGNOSIS — Z951 Presence of aortocoronary bypass graft: Secondary | ICD-10-CM

## 2011-01-23 NOTE — Progress Notes (Signed)
Agree with not.  Would restart Crestor if LFTs return normal.  [name]

## 2011-01-23 NOTE — Telephone Encounter (Signed)
Discussed with Dr. Aundra Dubin. She can restart Crestor now with normalized LFTs. Check LFTs at follow up visit. Richardson Dopp, PA-C

## 2011-01-23 NOTE — Progress Notes (Signed)
  HPI: Patient returns for routine postoperative follow-up having undergone coronary artery bypass grafting x3 on 11/25/2010. The patient's early postoperative recovery while in the hospital was notable for C. difficile colitis. The patient was readmitted to the hospital with perforated sigmoid colon and underwent sigmoid colectomy with diverting colostomy her recovery since then has been slow but nonetheless relatively uncomplicated. She did have some postoperative atrial fibrillation well she remained in the hospital, this resolved. Since hospital discharge the patient reports continued progress. The patient's midline abdominal wound has granulated in very rapidly with wound VAC therapy her appetite is improving she is eating well. Her strength is improved. Her bowels are functioning normally and she has not had problems managing her colostomy. She has not had any further tachypalpitations. She is does not have any exertional chest pain no shortness of breath..   Current Outpatient Prescriptions  Medication Sig Dispense Refill  . aspirin 81 MG chewable tablet Chew 81 mg by mouth daily.        . Calcium Carbonate-Vitamin D 600-400 MG-UNIT per tablet Take 1 tablet by mouth daily.        Marland Kitchen HYDROmorphone (DILAUDID) 2 MG tablet Take 2 mg by mouth every 4 (four) hours as needed.        . metoprolol succinate (TOPROL-XL) 25 MG 24 hr tablet daily.      . Multiple Vitamin (MULTIVITAMIN) tablet Take 1 tablet by mouth daily.        . Nutritional Supplements (NUTRITIONAL SUPPLEMENT PLUS) LIQD Take 1 Can by mouth 3 (three) times daily.        Marland Kitchen saccharomyces boulardii (FLORASTOR) 250 MG capsule Take 250 mg by mouth 2 (two) times daily.        . vitamin C (ASCORBIC ACID) 500 MG tablet Take 500 mg by mouth daily.          Physical Exam: On physical exam the patient looks quite good. Her sternal incision has healed completely. The sternum is stable on palpation. Breath sounds are clear to auscultation and  symmetrical bilaterally. Cardiovascular exam is notable for regular rate and rhythm. No murmurs rubs nor gallops are noted. The abdomen is soft and nontender. The extremities are warm and well-perfused. There is no lower extremity edema. The small incision from endoscopic vein harvest has now almost completely filled in.  Diagnostic Tests: Chest x-ray performed today is notable for clear lung fields bilaterally. There no significant pleural effusions. All of the sternal wires appear intact.  Impression: Satisfactory progress following coronary artery bypass grafting which was subsequently complicated by C. difficile colitis and later perforated sigmoid colon. At this point the patient is doing exceptionally well.  Plan: We will plan to see the patient back in 3 months time for further followup. I've encouraged patient to continue to gradually increase her physical activity as tolerated. Once she has been released from home health therapy I think it would be reasonable for her to start that palpation cardiac rehabilitation program.

## 2011-01-24 MED ORDER — ROSUVASTATIN CALCIUM 10 MG PO TABS: 10.0000 mg | ORAL_TABLET | Freq: Every day | ORAL | Status: DC

## 2011-01-24 NOTE — Telephone Encounter (Signed)
pt aware of lab results and to start back on crestor, her previous dose was 40 mg but she would like to start on a lower dose, told her that I would ask PA what he thinks if it ok to start on lower dose and that I wcb later today.Julaine Hua

## 2011-01-24 NOTE — Telephone Encounter (Signed)
lmom for pt that crestor 10 mg will be ok, called in new rx today to [facility]. Julaine Hua

## 2011-01-24 NOTE — Telephone Encounter (Signed)
Addended by: Michae Kava on: 01/24/2011 04:57 PM   Modules accepted: Orders

## 2011-01-30 ENCOUNTER — Telehealth (INDEPENDENT_AMBULATORY_CARE_PROVIDER_SITE_OTHER): Payer: Self-pay | Admitting: General Surgery

## 2011-01-30 NOTE — Telephone Encounter (Signed)
Patient's nurse called to say patient told them that she is feeling constipated. I advised Milk of Magnesia and for patient to call back if doesn't work.

## 2011-01-31 ENCOUNTER — Encounter (INDEPENDENT_AMBULATORY_CARE_PROVIDER_SITE_OTHER): Payer: Self-pay | Admitting: General Surgery

## 2011-01-31 ENCOUNTER — Ambulatory Visit (INDEPENDENT_AMBULATORY_CARE_PROVIDER_SITE_OTHER): Payer: Medicare Other | Admitting: General Surgery

## 2011-01-31 VITALS — BP 132/82 | HR 96 | Temp 98.1°F | Resp 18

## 2011-01-31 DIAGNOSIS — Z09 Encounter for follow-up examination after completed treatment for conditions other than malignant neoplasm: Secondary | ICD-10-CM

## 2011-01-31 DIAGNOSIS — Z933 Colostomy status: Secondary | ICD-10-CM

## 2011-01-31 NOTE — Patient Instructions (Addendum)
We will CANCEL your appointment with me for this Friday  Once a day wound care for now.  Keep up with eating well.

## 2011-02-02 ENCOUNTER — Telehealth (INDEPENDENT_AMBULATORY_CARE_PROVIDER_SITE_OTHER): Payer: Self-pay | Admitting: General Surgery

## 2011-02-02 NOTE — Telephone Encounter (Signed)
Home health nurse wanting to change frequency of visits since wound vac discharged. They will come out three times this week while teaching husband to do wet to dry and then switch to once a week to check on wound. I told her this sounds appropriate.

## 2011-02-03 ENCOUNTER — Encounter (INDEPENDENT_AMBULATORY_CARE_PROVIDER_SITE_OTHER): Payer: Medicare Other | Admitting: General Surgery

## 2011-02-03 NOTE — Progress Notes (Signed)
Chief complaint: Postop  Procedure: Status post emergency exploratory laparotomy with sigmoid colectomy and end colostomy for perforated C. difficile colitis December 18, 2010  History of Present Ilness: 70 year old Caucasian female comes in today for her seond postoperative appointment. She comes in today because the home health nurse was concerned that there was a small piece of retained wound vac sponge in her abdominal incision.  The pt currently has a wound vac for her midline incision.   Since last visit,  she states that she's been doing much better. She denies any fever or chills. She denies any abdominal pain. However she is still taking one Dilaudid tablet on a rare occasion. Her strength is improving but not back to baseline yet. She denies any problems with her colostomy. She reports an improving appetite. Home health is changing her wound VAC 3 days a week. She is also getting home health physical therapy and occupational therapy  Physical Exam: BP 132/82  Pulse 96  Temp(Src) 98.1 F (36.7 C) (Temporal)  Resp 38  Well-developed well-nourished Caucasian female in no apparent distress. Pulmonary-lungs are clear Cardiac-regular rate and rhythm Abdomen-soft, nontender, nondistended. There is a wound VAC in the midline. Ostomy in the left mid abdomen. Stool and air in the ostomy bag. No signs of incisional hernia. Lower extremities-trace edema up to her mid calves   I removed the wound vac.  Essentially, the midline incision in closed.  Above the umbilicus, there is about 2cm long area of skin that has not epithelized yet.  In the lower midline, the depth of the wound is about 3-68m. Skin is separated by about 382m  No cellulitis. There was one little speck of black sponge on her lower midline skin edge that I removed with pick-ups.  Assessment and Plan: Status post emergency exploratory laparotomy with sigmoid colectomy and end colostomy for perforated C. difficile colitis-doing  much better.  We discontinued the wound vac today.  We will do w-d dressings once a day. I encouraged her to eat a well-balanced diet. She is to continue with physical therapy and occupational therapy.  I will see her in 4 weeks

## 2011-02-13 ENCOUNTER — Ambulatory Visit (INDEPENDENT_AMBULATORY_CARE_PROVIDER_SITE_OTHER): Payer: Medicare Other | Admitting: Cardiology

## 2011-02-13 ENCOUNTER — Encounter: Payer: Self-pay | Admitting: Cardiology

## 2011-02-13 DIAGNOSIS — E785 Hyperlipidemia, unspecified: Secondary | ICD-10-CM

## 2011-02-13 DIAGNOSIS — I4891 Unspecified atrial fibrillation: Secondary | ICD-10-CM

## 2011-02-13 DIAGNOSIS — I2510 Atherosclerotic heart disease of native coronary artery without angina pectoris: Secondary | ICD-10-CM

## 2011-02-13 DIAGNOSIS — A0472 Enterocolitis due to Clostridium difficile, not specified as recurrent: Secondary | ICD-10-CM

## 2011-02-13 NOTE — Patient Instructions (Addendum)
You have the order for the home health nurse to collect a specimen for C difficile toxin.  787.91   Your physician recommends that you schedule a follow-up appointment in: 1 month with Dr Aundra Dubin.

## 2011-02-14 ENCOUNTER — Telehealth: Payer: Self-pay | Admitting: Cardiology

## 2011-02-14 ENCOUNTER — Telehealth (INDEPENDENT_AMBULATORY_CARE_PROVIDER_SITE_OTHER): Payer: Self-pay | Admitting: General Surgery

## 2011-02-14 DIAGNOSIS — I4891 Unspecified atrial fibrillation: Secondary | ICD-10-CM | POA: Insufficient documentation

## 2011-02-14 NOTE — Telephone Encounter (Signed)
Forwarded to [employer] [facility] for her knowledge Fredia Beets

## 2011-02-14 NOTE — Telephone Encounter (Signed)
Or der for c-diff can't be done under our orders, orders  Under dr [name], she has contacted dr [name] office to obtain an order for the labs, then will send [place] results

## 2011-02-14 NOTE — Assessment & Plan Note (Signed)
Patient had post-operative atrial fibrillation.  She was on coumadin and amiodarone briefly but both were stopped during recent hospitalization.  She has had no palpitations.  Will continue to monitor.  She is in sinus rhythm today.

## 2011-02-14 NOTE — Assessment & Plan Note (Signed)
Some abdominal discomfort and looser stool though no profuse diarrhea.  Given history of severe C difficile colitis, I am going to have her home health nurse send a stool sample for C diff toxin assessment.

## 2011-02-14 NOTE — Assessment & Plan Note (Signed)
Recovering status post CABG.  EF preserved.  She is getting home PT now and should start cardiac rehab at [facility] after home PT is completed.  She will continue ASA 81 mg daily and Toprol XL 25 mg daily.

## 2011-02-14 NOTE — Progress Notes (Signed)
PCP: Dr. Dena Terry in [place]  70 yo presented to [facility] in 7/12 with chest pain.  She ruled in for NSTEMI.  Cardiac catheterization 7/12: Proximal LAD 30%, mid LAD 60-70%, distal LAD 80%, ostial OM2 50%, mid OM2 40%, 80% at the trifurcation, proximal RCA 25% then occluded with left to right collaterals, EF 55%.  Echocardiogram 7/12: EF 60-65%, PASP 45.  She was referred for bypass surgery.  Grafts included a LIMA-LAD, SVG-D1, SVG-PDA.  Of note, she did have an endarterectomy of the OM.  She developed postoperative atrial fibrillation and was treated with Coumadin and amiodarone.  She maintained normal sinus rhythm with this.  Unfortunately, she developed C. Difficile colitis.  She improved and was eventually discharged home 12/07/10.   However, she was readmitted 8/3-8/18 with failure to thrive, anasarca and profound diarrhea.  She was placed on IV vancomycin.  She was diuresed for anasarca.  Relook echocardiogram demonstrated normal LV function.  CT scan demonstrated a perforated sigmoid colon and she was referred for exploratory laparotomy.  She underwent sigmoid colectomy with end colostomy [name] procedure) by Dr. Greer Terry.  She was transfused with PRBCs for acute blood loss anemia.  Her laparotomy incision became infected with Pseudomonas and this had to be closed by secondary intention.  She was placed on the wound VAC.  She had elevated LFTs and her amiodarone was discontinued.  She remained in normal sinus rhythm.  She was eventually discharged home on ciprofloxacin and vancomycin.  [name] is doing well today considering her rocky course this summer. She is getting PT at home.  She is walking > 200 yards in her driveway several times a day.  She has mild dyspnea by the end of her walk.  No chest pain.  No tachypalpitations. We had restarted her Crestor when LFTs normalized, but she developed leg weakness that resolved after stopping the Crestor.  Since last Tuesday, she has noted  occasional abdominal cramping.  She has had some loose stool but no profuse.  The stool does not smell like C. difficile per her husband.  She has not had a fever.    Labs (9/12): K 4.7, creatinine 0.6, LFTs normal, HCT 33.2  Past Medical History  Diagnosis Date  . Hypertension   . Coronary artery disease     a. NSTEMI 7/12, cath: pLAD 30%, mLAD 60-70%, dLAD 80%, oOM2 50%, mOM2 40%, trifurcation 80%, pRCA 25%, then occluded, L-R collats, EF 55%;   b. 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 Roxy Manns)  . Hyperlipidemia   . Pericarditis 1980    hx of  . GERD (gastroesophageal reflux disease)   . Palpitations 2003    hx of; Holter in 2003 with PACs and PVCs  . Atrial fibrillation     post-op after CABG;  treated with Amiodarone and coumadin until readmxn with CDiff colitis, perf colon and elevated LFTs (amio and coumadin stopped)  . C. difficile colitis 12/6765    complicated post CABG course with prolonged admission, perforated sigmoid colon;  s/p sigmoid colectomy with end colostomy [name] procedure);  laparotomy wound infection with Pseudomonas treated with antibxs and secondary intention  . 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 Surgical wound infection     Pseudomonas; wound VAC; secondary intention  . 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 aspirin 81 MG  chewable tablet Chew 81 mg by mouth daily.        . Calcium Carbonate-Vitamin D 600-400 MG-UNIT per tablet Take 1 tablet by mouth daily.        . metoprolol succinate (TOPROL-XL) 25 MG 24 hr tablet daily.      . Multiple Vitamin (MULTIVITAMIN) tablet Take 1 tablet by mouth daily.        . Nutritional Supplements (NUTRITIONAL SUPPLEMENT PLUS) LIQD Take 1 Can by mouth daily.       . rosuvastatin (CRESTOR) 10 MG tablet Take 1 tablet (10 mg total) by mouth at bedtime.  30 tablet  11  . saccharomyces boulardii (FLORASTOR)  250 MG capsule Take 250 mg by mouth 2 (two) times daily.        . vitamin C (ASCORBIC ACID) 500 MG tablet Take 500 mg by mouth daily.          Allergies: Allergies  Allergen Reactions  . Nadolol   . Sulfonamide Derivatives   . 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  Nonsmoker.   ROS: All systems reviewed and negative except as per HPI.   Vital Signs: BP 130/70  Pulse 92  Ht 5' 6"  (1.676 m)  Wt 135 lb (61.236 kg)  BMI 21.79 kg/m2  PHYSICAL EXAM: General: Well nourished, well developed, in no acute distress HEENT: normal Neck: no JVD At 90 Cardiac:  normal S1, S2; RRR; 1/6 SEM RUSB.  Lungs:  clear to auscultation bilaterally, no wheezing, rhonchi or rales Abd: Laparotomy wound with wound VAC in place; colostomy bag with brown stool Ext: no edema Skin: warm and dry Psych: Normal affect

## 2011-02-14 NOTE — Assessment & Plan Note (Signed)
Leg weakness with Crestor, better after stopping it.  I am going to leave her off statin for several weeks.  She will followup with me in a month and I will try to start her on pravastatin 20 mg daily at that time with coenzyme Q10.

## 2011-02-14 NOTE — Telephone Encounter (Signed)
Debroah Baller from [employer] called stating pt had mucus in stool and her cardiologist ordered at [facility], however she wanted to make sure her doctor received the results and wanted to know if Dr. [name] could order it for her. Advised Dr. [name] (based on schedule) was on vacation, informed her information would be given to his nurse to see if another doctor can place the order on Dr. Dois Davenport behalf.

## 2011-02-16 ENCOUNTER — Telehealth (INDEPENDENT_AMBULATORY_CARE_PROVIDER_SITE_OTHER): Payer: Self-pay

## 2011-02-16 NOTE — Telephone Encounter (Signed)
Spoke with the pt's home health nurse this morning.  The pt is no longer having cramping and loose stools.  She was concerned earlier this week that the pt was symtomatic w/ c-diff.  The pt's husband was concerned and wanted lab work done, but the wife has refused since their insurance would not pay for it if the test was negative.  Please call the pt to confirm.

## 2011-02-20 ENCOUNTER — Other Ambulatory Visit: Payer: Self-pay | Admitting: Cardiology

## 2011-02-20 ENCOUNTER — Telehealth: Payer: Self-pay | Admitting: Cardiology

## 2011-02-20 ENCOUNTER — Other Ambulatory Visit (INDEPENDENT_AMBULATORY_CARE_PROVIDER_SITE_OTHER): Payer: Self-pay | Admitting: General Surgery

## 2011-02-20 MED ORDER — METOPROLOL SUCCINATE ER 25 MG PO TB24: 25.0000 mg | ORAL_TABLET | Freq: Every day | ORAL | Status: DC

## 2011-02-20 NOTE — Telephone Encounter (Signed)
Pt needs refill of metoprolol 25 mg uses [facility] [place], said has been trying to get [facility] to fax request for a week and now only has 1 pill left, pls call in asap

## 2011-02-21 ENCOUNTER — Telehealth (INDEPENDENT_AMBULATORY_CARE_PROVIDER_SITE_OTHER): Payer: Self-pay

## 2011-02-21 NOTE — Telephone Encounter (Signed)
[name] with Hospice called to notify us that the wound has completely healed that she is not having to wear the aquagel anymore. I did advise to just put Triple Antibiotic ointment over the area with a bandage till her appt to see Dr [name] on 03-01-11./ [facility]

## 2011-02-23 ENCOUNTER — Telehealth (INDEPENDENT_AMBULATORY_CARE_PROVIDER_SITE_OTHER): Payer: Self-pay | Admitting: General Surgery

## 2011-02-23 NOTE — Telephone Encounter (Signed)
[facility] CALLED TO OK 2  VISITS TO [facility] TO [facility]AK[facility].  I APPROVED VISIT.

## 2011-02-24 ENCOUNTER — Ambulatory Visit (INDEPENDENT_AMBULATORY_CARE_PROVIDER_SITE_OTHER): Payer: Medicare Other | Admitting: General Surgery

## 2011-02-24 ENCOUNTER — Encounter (INDEPENDENT_AMBULATORY_CARE_PROVIDER_SITE_OTHER): Payer: Self-pay | Admitting: General Surgery

## 2011-02-24 VITALS — BP 142/80 | HR 68 | Temp 98.3°F | Resp 16 | Ht 66.5 in | Wt 135.0 lb

## 2011-02-24 DIAGNOSIS — Z933 Colostomy status: Secondary | ICD-10-CM

## 2011-02-24 NOTE — Progress Notes (Signed)
Chief complaint: Postop  Procedure: Status post emergency exploratory laparotomy with sigmoid colectomy and end colostomy for perforated C. difficile colitis December 18, 2010  History of Present Ilness: 70 year old Caucasian female comes in today for her third postoperative appointment. Since last visit,  she states that she's been doing much better. She had an episode of crampy abdominal pain & with loose stool in her ostomy bag about 2 weeks ago.  It was transient & resolved. She denies any fever or chills. She denies any abdominal pain. She was discharged from PT yesterday.  She is starting cardiac rehab next week.  She had some calf pain & her cardiologist stopped her crestor.  She states her appetite is great. Her energy level is getting better.  They are no longer using Aquacel on her midline incision-just some triple antibiotic ointment.   Physical Exam: BP 142/80  Pulse 68  Temp(Src) 98.3 F (36.8 C) (Temporal)  Resp 16  Ht 5' 6.5" (1.689 m)  Wt 135 lb (61.236 kg)  BMI 21.46 kg/m2  Well-developed well-nourished Caucasian female in no apparent distress. Pulmonary-lungs are clear Cardiac-regular rate and rhythm Abdomen-soft, nontender, nondistended. Midline incision is essentially healed- there is a little skin epithelization that remains to be completed. No signs of infection. Ostomy in the left mid abdomen. Stool and air in the ostomy bag. No signs of incisional or parastomal hernia. Lower extremities-no edema    Assessment and Plan: Status post emergency exploratory laparotomy with sigmoid colectomy and end colostomy for perforated C. difficile colitis-doing much better.  I encouraged her to eat a well-balanced diet and to eat foods that are high in fiber.  I told her she could stop drinking the boost & taking the probiotic.  I told her she could eat some yogurt like Activia. She was given a list of high fiber foods. I told it was normal to pass some mucous from her rectum.   I  will see her in 6 weeks  I also explained that I would not recommend colostomy reversal until at least 6 months from this past August.

## 2011-02-24 NOTE — Patient Instructions (Signed)
Call us at [phone] & ask for [name], my nurse, if have issues with your ostomy. Eat foods that are high in fiber like we discussed.  Fiber Content of Foods Drinking plenty of fluids and consuming foods high in fiber can help with constipation. See the list below for the fiber content of some common foods. Food Group Food Item Amount Grams of Fiber  Starches and Grains Cheerios 1 Cup 3   Kellogg's Corn Flakes 1 Cup 0.7   Rice Krispies 1  Cup 0.3   Quaker Oat Life Cereal  Cup 2.1   Oatmeal, instant (cooked)  Cup 2   Kellogg's Frosted Mini Wheats 1 Cup 5.1   Rice, brown, long-grain (cooked) 1 Cup 3.5   Rice, white, long-grain (cooked) 1 Cup 0.6   Macaroni, cooked, enriched 1 Cup 2.5  Legumes Beans, baked, canned, plain or vegetarian  Cup 5.2   Beans, kidney, canned  Cup 6.8   Beans, pinto, dried (cooked)  Cup 7.7   Beans, pinto, canned  Cup 5.5  Breads and Crackers Graham crackers, plain or honey 2 squares 0.7   Saltine crackers 3 0.3   Pretzels, plain, salted 10 pieces 1.8   Bread, whole wheat 1 slice 1.9   Bread, white 1 slice 0.7   Bread, raisin 1 slice 1.2   Bagel, plain 3 oz 2   Tortilla, flour 1 oz 0.9   Tortilla, corn 1 small 1.5   Bun, hamburger or hotdog 1 small 0.9  Fruits Apple, raw with skin 1 medium 4.4   Applesauce, sweetened  Cup 1.5   Banana  medium 1.5   Grapes 10 grapes 0.4   Orange 1 small 2.3   Raisin 1.5 oz 1.6   Melon 1 Cup 1.4  Vegetables Green beans, canned  Cup 1.3   Carrots (cooked)  Cup 2.3   Broccoli (cooked)  Cup 2.8   Peas, frozen (cooked)  Cup 4.4   Potatoes, mashed  Cup 1.6   Lettuce 1 Cup 0.5   Corn, canned  Cup 1.6   Tomato  Cup 1.1  Information taken from the [employer], 2008. Document Released: 09/17/2006  [facility] Patient Information 2011 [place].

## 2011-02-27 ENCOUNTER — Telehealth (INDEPENDENT_AMBULATORY_CARE_PROVIDER_SITE_OTHER): Payer: Self-pay | Admitting: General Surgery

## 2011-02-27 NOTE — Telephone Encounter (Signed)
Spoke with Renee at [facility] and okayed two more visits to make sure she was okay with her ostomy and supplies. She will call if she needs anything else.

## 2011-03-09 ENCOUNTER — Telehealth: Payer: Self-pay | Admitting: Cardiology

## 2011-03-09 NOTE — Telephone Encounter (Signed)
Pt pulse was 52 and she is asymptomatic and she wanted to let the nurse know

## 2011-03-09 NOTE — Telephone Encounter (Signed)
Dr Aundra Dubin said this was OK.

## 2011-03-21 ENCOUNTER — Ambulatory Visit: Payer: Medicare Other | Admitting: Cardiology

## 2011-03-30 ENCOUNTER — Telehealth (INDEPENDENT_AMBULATORY_CARE_PROVIDER_SITE_OTHER): Payer: Self-pay | Admitting: General Surgery

## 2011-03-30 NOTE — Telephone Encounter (Signed)
Patient called and left a message stating her hair has been falling out in clumps. Calling us for guidance. Patient had emergency exploratory laparotomy with sigmoid colectomy and end colostomy for perforated C. difficile colitis December 18, 2010. I spoke with patient, states this has been going on for 2 weeks. I advised patient she should contact her PCP so they can check some vitamin levels on her and go from there. She agrees with this plan and will call us if she needs [place].

## 2011-04-04 ENCOUNTER — Ambulatory Visit (INDEPENDENT_AMBULATORY_CARE_PROVIDER_SITE_OTHER): Payer: Medicare Other | Admitting: Cardiology

## 2011-04-04 ENCOUNTER — Encounter: Payer: Self-pay | Admitting: Cardiology

## 2011-04-04 DIAGNOSIS — I2510 Atherosclerotic heart disease of native coronary artery without angina pectoris: Secondary | ICD-10-CM

## 2011-04-04 DIAGNOSIS — I4891 Unspecified atrial fibrillation: Secondary | ICD-10-CM

## 2011-04-04 DIAGNOSIS — E7800 Pure hypercholesterolemia, unspecified: Secondary | ICD-10-CM

## 2011-04-04 DIAGNOSIS — I25810 Atherosclerosis of coronary artery bypass graft(s) without angina pectoris: Secondary | ICD-10-CM

## 2011-04-04 DIAGNOSIS — E785 Hyperlipidemia, unspecified: Secondary | ICD-10-CM

## 2011-04-04 MED ORDER — PRAVASTATIN SODIUM 20 MG PO TABS: 20.0000 mg | ORAL_TABLET | Freq: Every evening | ORAL | Status: DC

## 2011-04-04 NOTE — Patient Instructions (Signed)
Start pravastatin 44m daily in the evening.  Take coenzyme Q10 2082mdaily.  Your physician recommends that you return for a FASTING lipid profile /liver profile in 2 months. 414.05   272.0   Your physician recommends that you schedule a follow-up appointment in: 4 months with Dr McAundra Dubin

## 2011-04-06 NOTE — Assessment & Plan Note (Signed)
Recovering status post CABG.  EF preserved.  Continue cardiac rehab.  She is on ASA 81 and Toprol XL.  I am going to start pravastatin.

## 2011-04-06 NOTE — Assessment & Plan Note (Signed)
Patient had post-operative atrial fibrillation.  She has had no palpitations.  Will continue to monitor.  She is in sinus rhythm today.

## 2011-04-06 NOTE — Assessment & Plan Note (Signed)
Patient unable to tolerate Crestor due to leg weakness.  I will try her on a low dose of pravastatin at 20 mg daily with coenzyme Q10 200 mg daily.  Lipids/LFTs in 2 months.

## 2011-04-06 NOTE — Progress Notes (Signed)
PCP: Dr. Dena Billet  70 yo presented to [facility] in 7/12 with chest pain.  She ruled in for NSTEMI.  Cardiac catheterization 7/12: Proximal LAD 30%, mid LAD 60-70%, distal LAD 80%, ostial OM2 50%, mid OM2 40%, 80% at the trifurcation, proximal RCA 25% then occluded with left to right collaterals, EF 55%.  Echocardiogram 7/12: EF 60-65%, PASP 45.  She was referred for bypass surgery.  Grafts included a LIMA-LAD, SVG-D1, SVG-PDA.  Of note, she did have an endarterectomy of the OM.  She developed postoperative atrial fibrillation and was treated with Coumadin and amiodarone.  She maintained normal sinus rhythm with this.  Unfortunately, she developed C. Difficile colitis.  She improved and was eventually discharged home in 7/12.   However, she was readmitted 8/3-8/18 with failure to thrive, anasarca and profound diarrhea.  She was placed on IV vancomycin.  She was diuresed for anasarca.  Relook echocardiogram demonstrated normal LV function.  CT scan demonstrated a perforated sigmoid colon and she was referred for exploratory laparotomy.  She underwent sigmoid colectomy with end colostomy [name] procedure) by Dr. [name].  She was transfused with PRBCs for acute blood loss anemia.  Her laparotomy incision became infected with Pseudomonas and this had to be closed by secondary intention.  She was placed on the wound VAC.  She had elevated LFTs and her amiodarone was discontinued.  She remained in normal sinus rhythm.  She was eventually discharged home on ciprofloxacin and vancomycin.  Plan for colostomy reversal in 2/12.   Mrs Babin is doing well.  She has been doing cardiac rehab in [place].  No chest pain.  No dyspnea.  She developed leg weakness on Crestor which has resolved after stopping Crestor.  Weight is stable.   Labs (9/12): K 4.7, creatinine 0.6, LFTs normal, HCT 33.2  Past Medical History  Diagnosis Date  . Hypertension   . Coronary artery disease     a. NSTEMI 7/12, cath:  pLAD 30%, mLAD 60-70%, dLAD 80%, oOM2 50%, mOM2 40%, trifurcation 80%, pRCA 25%, then occluded, L-R collats, EF 55%;   b. 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 Roxy Manns)  . Hyperlipidemia   . Pericarditis 1980    hx of  . GERD (gastroesophageal reflux disease)   . Palpitations 2003    hx of; Holter in 2003 with PACs and PVCs  . Atrial fibrillation     post-op after CABG;  treated with Amiodarone and coumadin until readmxn with CDiff colitis, perf colon and elevated LFTs (amio and coumadin stopped)  . C. difficile colitis 12/4694    complicated post CABG course with prolonged admission, perforated sigmoid colon;  s/p sigmoid colectomy with end colostomy [name] procedure);  laparotomy wound infection with Pseudomonas treated with antibxs and secondary intention  . 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 Surgical wound infection     Pseudomonas; wound VAC; secondary intention  . 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 aspirin 81 MG chewable tablet Chew 81 mg by mouth daily.        . Bacillus Coagulans-Inulin (PROBIOTIC-PREBIOTIC) 1-250 BILLION-MG CAPS Take by mouth daily.        . Calcium Carbonate-Vitamin D 600-400 MG-UNIT per tablet Take 1 tablet by mouth daily.        . metoprolol succinate (TOPROL-XL) 25 MG 24 hr tablet Take  1 tablet (25 mg total) by mouth daily.  30 tablet  11  . Multiple Vitamin (MULTIVITAMIN) tablet Take 1 tablet by mouth daily.        Marland Kitchen senna (SENOKOT) 8.6 MG tablet Take 1 tablet by mouth 2 (two) times daily.        . vitamin C (ASCORBIC ACID) 500 MG tablet Take 500 mg by mouth daily.        . Coenzyme Q10 200 MG TABS Take by mouth.    0  . pravastatin (PRAVACHOL) 20 MG tablet Take 1 tablet (20 mg total) by mouth every evening.  30 tablet  3    Allergies: Allergies  Allergen Reactions  . Nadolol   . Sulfonamide Derivatives   . 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  Nonsmoker.   ROS: All systems reviewed and negative except as per HPI.   Vital Signs: BP 128/70  Pulse 68  Ht 5' 6.5" (1.689 m)  Wt 61.689 kg (136 lb)  BMI 21.62 kg/m2  PHYSICAL EXAM: General: Well nourished, well developed, in no acute distress HEENT: normal Neck: no JVD At 90 Cardiac:  normal S1, S2; RRR; 1/6 SEM RUSB.  Lungs:  clear to auscultation bilaterally, no wheezing, rhonchi or rales YEB:XIDH, nontender, colostomy bag with brown stool Ext: no edema Skin: warm and dry Psych: Normal affect

## 2011-04-13 ENCOUNTER — Encounter (INDEPENDENT_AMBULATORY_CARE_PROVIDER_SITE_OTHER): Payer: Self-pay | Admitting: General Surgery

## 2011-04-13 ENCOUNTER — Ambulatory Visit (INDEPENDENT_AMBULATORY_CARE_PROVIDER_SITE_OTHER): Payer: Medicare Other | Admitting: General Surgery

## 2011-04-13 VITALS — BP 126/68 | HR 64 | Temp 98.1°F | Resp 19 | Ht 66.5 in | Wt 136.8 lb

## 2011-04-13 DIAGNOSIS — Z933 Colostomy status: Secondary | ICD-10-CM

## 2011-04-13 NOTE — Progress Notes (Signed)
Chief complaint: Postop  Procedure: Status post emergency exploratory laparotomy with sigmoid colectomy and end colostomy for perforated C. difficile colitis December 18, 2010  History of Present Ilness: 70 year old Caucasian female comes in today for her fourth postoperative appointment. Since last visit,  she states that she's been doing much better.  She denies any fever or chills. She denies any abdominal pain. She started cardiac rehab a few weeks ago. She has completed 8 out of 36 weeks. She states she really enjoys it.   She states her appetite is great. Her energy level is getting better.  She met with Juliann Pulse the ostomy nurse and all the problems she was having with her ostomy have resolved.  Her mother has become severely ill and she is leaving for [place] Friday.   PMH, PSH, ALL, MED, SOCH reviewed and unchanged.   ROS- 10point negative except for what is mentioned in HPI.  Physical Exam: BP 126/68  Pulse 64  Temp(Src) 98.1 F (36.7 C) (Temporal)  Resp 19  Ht 5' 6.5" (1.689 m)  Wt 136 lb 12.8 oz (62.052 kg)  BMI 21.75 kg/m2  Well-developed well-nourished Caucasian female in no apparent distress. Pulmonary-lungs are clear Cardiac-regular rate and rhythm Abdomen-soft, nontender, nondistended. Midline incision is healed-  No signs of infection. Ostomy in the left mid abdomen. Stool and air in the ostomy bag. No signs of incisional or parastomal hernia. Lower extremities-no edema    Assessment and Plan: Status post emergency exploratory laparotomy with sigmoid colectomy and end colostomy for perforated C. difficile colitis-doing much better.  I encouraged her to eat a well-balanced diet and to eat foods that are high in fiber. Cont cardiac rehab  I will see her in 25month  Plan ostomy reversal in Feb 2013.    ELeighton Ruff WRedmond Pulling MD, FACS General, Bariatric, & Minimally Invasive [facility] [place]

## 2011-04-13 NOTE — Patient Instructions (Signed)
Keep up eating healthy and with the cardiac rehab

## 2011-04-24 ENCOUNTER — Encounter: Payer: Self-pay | Admitting: Thoracic Surgery (Cardiothoracic Vascular Surgery)

## 2011-04-24 ENCOUNTER — Ambulatory Visit (INDEPENDENT_AMBULATORY_CARE_PROVIDER_SITE_OTHER): Payer: Medicare Other | Admitting: Thoracic Surgery (Cardiothoracic Vascular Surgery)

## 2011-04-24 VITALS — BP 132/70 | HR 64 | Resp 16 | Ht 67.5 in | Wt 139.0 lb

## 2011-04-24 DIAGNOSIS — I2510 Atherosclerotic heart disease of native coronary artery without angina pectoris: Secondary | ICD-10-CM

## 2011-04-24 DIAGNOSIS — Z951 Presence of aortocoronary bypass graft: Secondary | ICD-10-CM

## 2011-04-24 DIAGNOSIS — Z09 Encounter for follow-up examination after completed treatment for conditions other than malignant neoplasm: Secondary | ICD-10-CM

## 2011-04-24 DIAGNOSIS — A0472 Enterocolitis due to Clostridium difficile, not specified as recurrent: Secondary | ICD-10-CM

## 2011-04-24 NOTE — Progress Notes (Signed)
                   [address]            ,[place] [address]          [phone]     CARDIOTHORACIC SURGERY OFFICE NOTE  Referring Provider is Bensimhon, Shaune Pascal, MD PCP is Tacey Ruiz, MD   HPI:  Patient returns for follow-up s/p CABG on 11/25/2010. She was last seen here in the office 01/23/2011. Since then she has done exceptionally well. She has been progressing very nicely through the cardiac rehabilitation program and her exercise tolerance is now quite good. She has no residual soreness in her chest. She denies shortness of breath. She's not having any sort of exertional chest discomfort. Her bowel function is regular. She plans to have her colostomy taken down in February.   Current Outpatient Prescriptions  Medication Sig Dispense Refill  . aspirin 81 MG chewable tablet Chew 81 mg by mouth daily.        . Bacillus Coagulans-Inulin (PROBIOTIC-PREBIOTIC) 1-250 BILLION-MG CAPS Take by mouth daily.        . Calcium Carbonate-Vitamin D 600-400 MG-UNIT per tablet Take 1 tablet by mouth daily.        . Coenzyme Q10 200 MG TABS Take by mouth.    0  . metoprolol succinate (TOPROL-XL) 25 MG 24 hr tablet Take 1 tablet (25 mg total) by mouth daily.  30 tablet  11  . Multiple Vitamin (MULTIVITAMIN) tablet Take 1 tablet by mouth daily.        . pravastatin (PRAVACHOL) 20 MG tablet Take 1 tablet (20 mg total) by mouth every evening.  30 tablet  3  . senna (SENOKOT) 8.6 MG tablet Take 1 tablet by mouth 2 (two) times daily.        . vitamin C (ASCORBIC ACID) 500 MG tablet Take 500 mg by mouth daily.            Physical Exam:   BP 132/70  Pulse 64  Resp 16  Ht 5' 7.5" (1.715 m)  Wt 139 lb (63.05 kg)  BMI 21.45 kg/m2  SpO2 98%  HEENT:  Unremarkable  Breath sounds: Clear and symmetrical  CV:   Regular rate and rhythm with no murmurs  Abdomen:  Soft and nontender  Extremities:  Warm and well-perfused with no lower extremity edema  Diagnostic  Tests:  n/a  Impression:  The patient is doing well 5 months following point he artery bypass grafting  Plan:  In the future the patient will call and return to see [place] as needed. All of her questions been addressed.   Valentina Gu. Roxy Manns, MD

## 2011-06-05 ENCOUNTER — Other Ambulatory Visit: Payer: Medicare Other | Admitting: *Deleted

## 2011-06-07 ENCOUNTER — Other Ambulatory Visit (INDEPENDENT_AMBULATORY_CARE_PROVIDER_SITE_OTHER): Payer: Medicare Other | Admitting: *Deleted

## 2011-06-07 ENCOUNTER — Ambulatory Visit (INDEPENDENT_AMBULATORY_CARE_PROVIDER_SITE_OTHER): Payer: Medicare Other | Admitting: General Surgery

## 2011-06-07 ENCOUNTER — Encounter (INDEPENDENT_AMBULATORY_CARE_PROVIDER_SITE_OTHER): Payer: Self-pay | Admitting: General Surgery

## 2011-06-07 VITALS — BP 136/66 | HR 64 | Temp 98.3°F | Resp 18 | Ht 66.5 in | Wt 139.4 lb

## 2011-06-07 DIAGNOSIS — E7800 Pure hypercholesterolemia, unspecified: Secondary | ICD-10-CM

## 2011-06-07 DIAGNOSIS — Z433 Encounter for attention to colostomy: Secondary | ICD-10-CM

## 2011-06-07 DIAGNOSIS — I25810 Atherosclerosis of coronary artery bypass graft(s) without angina pectoris: Secondary | ICD-10-CM

## 2011-06-07 LAB — HEPATIC FUNCTION PANEL
ALT: 26 U/L (ref 0–35)
AST: 30 U/L (ref 0–37)
Bilirubin, Direct: 0.1 mg/dL (ref 0.0–0.3)
Total Bilirubin: 0.9 mg/dL (ref 0.3–1.2)
Total Protein: 7 g/dL (ref 6.0–8.3)

## 2011-06-07 LAB — LIPID PANEL: Cholesterol: 180 mg/dL (ref 0–200)

## 2011-06-07 MED ORDER — POLYETHYLENE GLYCOL 3350 17 GM/SCOOP PO POWD: 255.0000 g | Freq: Once | ORAL | Status: AC

## 2011-06-07 NOTE — Patient Instructions (Signed)
CENTRAL  SURGERY  ONE-DAY (1) PRE-OP HOME COLON PREP INSTRUCTIONS: ** MIRALAX / GATORADE PREP **  Fill the prescription at a pharmacy of your choice.  You must follow the instructions below carefully.  If you have questions or problems, please call and speak to someone in the clinic department at our office:   [phone].  MIRALAX - GATORADE -- DULCOLAX TABS:   Fill the prescriptions for MIRALAX  (255 gm bottle)    In addition, purchase four (4) DULCOLAX TABLETS (no prescription required), and one 64 oz GATORADE.  (Do NOT purchase red Gatorade; any other flavor is acceptable).  ANITIBIOTICS: You will not have pre-op antibiotics  INSTRUCTIONS: 1. Five days prior to your procedure do not eat nuts, popcorn, or fruit with seeds.  Stop all fiber supplements such as Metamucil, Citrucel, etc. 2. Stay on liquids two days prior to surgery  3. The day before your procedure: o 6:00am:  take (4) Dulcolax tablets.  You should remain on clear liquids for the entire day.   CLEAR LIQUIDS: clear bouillon, broth, jello (NOT RED), black coffee, tea, soda, etc o 10:00am:  add the bottle of MiraLax to the 64-oz bottle of Gatorade, and dissolve.  Begin drinking the Gatorade mixture until gone (8 oz every 15-30 minutes).  Continue clear liquids until midnight (or bedtime). o Take the antibiotics at the times instructed on the bottles.  4. The day of your procedure:   Do not eat or drink ANYTHING after midnight before your surgery.     If you take Heart or Blood Pressure medicine, ask the pre-op nurses about these during your preop appointment.   Further pre-operative instructions will be given to you from the hospital.   Expect to be contacted 5-7 days before your surgery.

## 2011-06-07 NOTE — Progress Notes (Signed)
Patient ID: [name], female   DOB: 1940-05-26, 71 y.o.   MRN: [id number]  Chief Complaint  Patient presents with  . Follow-up    reck abd wd    HPI [name] [name] is a 71 y.o. female.   HPI 71 year old Caucasian female comes in for long-term followup. I last saw her on April 13, 2011. She had underwent a an emergency exploratory laparotomy, sigmoid colectomy with end colostomy for perforated C. difficile colitis on December 18, 2010. She comes in today to discuss ostomy reversal. She states she has been doing well. She is still going to cardiac rehabilitation. She states that her energy levels are normal. She states that her appetite is great. She denies any fevers, chills, nausea, vomiting, weight loss, chest pain, shortness of breath. She has not had any problems with her ostomy. She will occasionally pass some mucus from her rectum.  Past Medical History  Diagnosis Date  . Hypertension   . Coronary artery disease     a. NSTEMI 7/12, cath: pLAD 30%, mLAD 60-70%, dLAD 80%, oOM2 50%, mOM2 40%, trifurcation 80%, pRCA 25%, then occluded, L-R collats, EF 55%;   b. 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 Roxy Manns)  . Hyperlipidemia   . Pericarditis 1980    hx of  . GERD (gastroesophageal reflux disease)   . Palpitations 2003    hx of; Holter in 2003 with PACs and PVCs  . Atrial fibrillation     post-op after CABG;  treated with Amiodarone and coumadin until readmxn with CDiff colitis, perf colon and elevated LFTs (amio and coumadin stopped)  . C. difficile colitis 05/9507    complicated post CABG course with prolonged admission, perforated sigmoid colon;  s/p sigmoid colectomy with end colostomy [name] procedure);  laparotomy wound infection with Pseudomonas treated with antibxs and secondary intention  . 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 Surgical wound infection     Pseudomonas; wound VAC; secondary intention  . 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 Hair loss     Past Surgical History  Procedure Date  . Appendectomy   . Oophorectomy     unilateral  . Salpingectomy   . Hemorrhoid surgery     x2  . Sigmoidectomy 12/18/2010    with end colostomy  . Coronary artery bypass graft 11-25-2010    LIMA to LAD, SVG to PDA, SVG to OM, EVH via right thigh    Family History  Problem Relation Age of Onset  . Heart attack Father 19    died  . Heart disease Father     massive heart attack  . Coronary artery disease Mother 25    [place]  . Heart disease Mother     Social History History  Substance Use Topics  . Smoking status: Never Smoker   . Smokeless tobacco: Not on file  . Alcohol Use: No    Allergies  Allergen Reactions  . Nadolol   . Sulfonamide Derivatives   . Verapamil     Current Outpatient Prescriptions  Medication Sig Dispense Refill  . aspirin 81 MG chewable tablet Chew 81 mg by mouth daily.        . Bacillus Coagulans-Inulin (PROBIOTIC-PREBIOTIC) 1-250 BILLION-MG CAPS Take by mouth daily.        . Calcium Carbonate-Vitamin D 600-400 MG-UNIT per tablet Take 1 tablet by mouth daily.        Marland Kitchen  Coenzyme Q10 200 MG TABS Take by mouth.    0  . metoprolol succinate (TOPROL-XL) 25 MG 24 hr tablet Take 1 tablet (25 mg total) by mouth daily.  30 tablet  11  . Multiple Vitamin (MULTIVITAMIN) tablet Take 1 tablet by mouth daily.        . pravastatin (PRAVACHOL) 20 MG tablet Take 1 tablet (20 mg total) by mouth every evening.  30 tablet  3  . senna (SENOKOT) 8.6 MG tablet Take 1 tablet by mouth 2 (two) times daily.        . vitamin C (ASCORBIC ACID) 500 MG tablet Take 500 mg by mouth daily.        . polyethylene glycol powder (MIRALAX) powder Take 255 g by mouth once.  255 g  0    Review of Systems Review of Systems  Constitutional: Negative for fever, chills, fatigue and unexpected weight change.  HENT: Negative for hearing loss, congestion, sore throat, trouble swallowing and  voice change.   Eyes: Negative for visual disturbance.  Respiratory: Negative for cough, chest tightness, shortness of breath and wheezing.        Denies DOE, PND, orthopnea  Cardiovascular: Negative for chest pain, palpitations and leg swelling.  Gastrointestinal: Negative for nausea, vomiting, abdominal pain, diarrhea and abdominal distention.  Genitourinary: Negative for hematuria, vaginal bleeding and difficulty urinating.  Musculoskeletal: Negative for arthralgias.  Skin: Negative for rash and wound.  Neurological: Negative for seizures, syncope and headaches.  Hematological: Negative for adenopathy. Does not bruise/bleed easily.  Psychiatric/Behavioral: Negative for confusion.    Blood pressure 136/66, pulse 64, temperature 98.3 F (36.8 C), temperature source Temporal, resp. rate 18, height 5' 6.5" (1.689 m), weight 139 lb 6.4 oz (63.231 kg).  Physical Exam Physical Exam  Vitals reviewed. Constitutional: She is oriented to person, place, and time. She appears well-developed and well-nourished. No distress.  HENT:  Head: Normocephalic and atraumatic.  Eyes: Conjunctivae are normal. No scleral icterus.  Neck: Normal range of motion. Neck supple. No tracheal deviation present. No thyromegaly present.  Cardiovascular: Normal rate, regular rhythm and normal heart sounds.   Pulmonary/Chest: Effort normal and breath sounds normal. No respiratory distress. She has no wheezes.  Abdominal: Soft. Bowel sounds are normal. She exhibits no distension. There is no tenderness.         Midline incision; LLQ ostomy-no para-stomal hernia. No evid of incisional hernia. Ostomy-pink/viable  Musculoskeletal: Normal range of motion. She exhibits no edema and no tenderness.  Neurological: She is alert and oriented to person, place, and time. She exhibits normal muscle tone.  Skin: Skin is warm and dry. No rash noted. She is not diaphoretic. No erythema.  Psychiatric: She has a normal mood and affect.  Her behavior is normal. Judgment and thought content normal.    Data Reviewed Last office note Dr Guy Sandifer note  Assessment    H/o perforated sigmoid colon secondary to C Diff colitis s/p Hartmann's procedure    Plan    We are approaching 6 months since the patients perforation.  The patient has requested ostomy reversal which I believe his reasonable.   I discussed the procedure in detail.  The patient was given Neurosurgeon.  We discussed the risks and benefits of surgery including, but not limited to bleeding, infection (such as wound infection, abdominal abscess), injury to surrounding structures, blood clot formation, urinary retention, incisional hernia, anastomotic stricture, anastomotic leak, anesthesia risks, pulmonary & cardiac complications such as pneumonia &/or heart attack, need  for additional procedures, ileus, & prolonged hospitalization.  We discussed the typical postoperative recovery course, including limitations & restrictions postoperatively. I explained that the likelihood of improvement in their symptoms is good.  We will get an contrasted enema prior to reversal to make sure there is no distal stricture.  We will plan on perioperative Entereg.  We will plan gentle 2 day bowel prep with liquids x 2 days, and miralax.   PLAN: COLOSTOMY REVERSAL, RIGID PROCTOSCOPY       [name] M 06/07/2011, 1:34 PM

## 2011-06-12 ENCOUNTER — Encounter (HOSPITAL_COMMUNITY): Payer: Self-pay | Admitting: Pharmacy Technician

## 2011-06-15 ENCOUNTER — Other Ambulatory Visit: Payer: Self-pay | Admitting: *Deleted

## 2011-06-15 ENCOUNTER — Telehealth: Payer: Self-pay | Admitting: Cardiology

## 2011-06-15 DIAGNOSIS — E785 Hyperlipidemia, unspecified: Secondary | ICD-10-CM

## 2011-06-15 MED ORDER — PRAVASTATIN SODIUM 40 MG PO TABS: 40.0000 mg | ORAL_TABLET | Freq: Every evening | ORAL | Status: DC

## 2011-06-15 NOTE — Telephone Encounter (Signed)
Fu Call: Pt returning call from our office regarding pt recent lab results. Please return pt call to discuss further.

## 2011-06-16 ENCOUNTER — Ambulatory Visit
Admission: RE | Admit: 2011-06-16 | Discharge: 2011-06-16 | Disposition: A | Discharge status: Home / Self Care | Payer: Medicare Other | Source: Ambulatory Visit | Attending: General Surgery | Admitting: General Surgery

## 2011-06-19 ENCOUNTER — Telehealth (INDEPENDENT_AMBULATORY_CARE_PROVIDER_SITE_OTHER): Payer: Self-pay | Admitting: General Surgery

## 2011-06-19 NOTE — Telephone Encounter (Signed)
Called and spoke with patient's husband, she was at Cardiac rehab. Made him aware that her gastrograffin enema results were good and we can proceed with surgery. He will inform his wife and have her call if she has any questions.

## 2011-06-19 NOTE — Telephone Encounter (Signed)
Message copied by Margarette Asal on Mon Jun 19, 2011 10:21 AM ------      Message from: [name], ERIC M      Created: Fri Jun 16, 2011  4:03 PM       pls let pt know that her enema study was normal- no stricture or leak...proceeding with surgery as planned.

## 2011-06-22 ENCOUNTER — Encounter (HOSPITAL_COMMUNITY)
Admission: RE | Admit: 2011-06-22 | Discharge: 2011-06-22 | Disposition: A | Discharge status: Home / Self Care | Payer: Medicare Other | Source: Ambulatory Visit | Attending: Anesthesiology | Admitting: Anesthesiology

## 2011-06-22 ENCOUNTER — Encounter (HOSPITAL_COMMUNITY)
Admission: RE | Admit: 2011-06-22 | Discharge: 2011-06-22 | Disposition: A | Discharge status: Home / Self Care | Payer: Medicare Other | Source: Ambulatory Visit | Attending: General Surgery | Admitting: General Surgery

## 2011-06-22 ENCOUNTER — Encounter (HOSPITAL_COMMUNITY): Payer: Self-pay

## 2011-06-22 HISTORY — DX: Nonrheumatic mitral (valve) prolapse: I34.1

## 2011-06-22 HISTORY — DX: Unspecified osteoarthritis, unspecified site: M19.90

## 2011-06-22 LAB — COMPREHENSIVE METABOLIC PANEL
CO2: 32 mEq/L (ref 19–32)
Calcium: 10.6 mg/dL — ABNORMAL HIGH (ref 8.4–10.5)
Creatinine, Ser: 0.8 mg/dL (ref 0.50–1.10)
GFR calc Af Amer: 85 mL/min — ABNORMAL LOW (ref >90)
GFR calc non Af Amer: 74 mL/min — ABNORMAL LOW (ref >90)
Glucose, Bld: 93 mg/dL (ref 70–99)
Total Protein: 7.4 g/dL (ref 6.0–8.3)

## 2011-06-22 LAB — TYPE AND SCREEN
ABO/RH(D): A POS
Antibody Screen: NEGATIVE

## 2011-06-22 LAB — CBC
Hemoglobin: 13.2 g/dL (ref 12.0–15.0)
MCH: 27.3 pg (ref 26.0–34.0)
MCHC: 31.4 g/dL (ref 30.0–36.0)
MCV: 86.8 fL (ref 78.0–100.0)
Platelets: 154 10*3/uL (ref 150–400)
RBC: 4.84 MIL/uL (ref 3.87–5.11)

## 2011-06-22 LAB — SURGICAL PCR SCREEN: MRSA, PCR: NEGATIVE

## 2011-06-22 LAB — DIFFERENTIAL
Basophils Absolute: 0.1 10*3/uL (ref 0.0–0.1)
Lymphocytes Relative: 38 % (ref 12–46)
Neutro Abs: 2.6 10*3/uL (ref 1.7–7.7)

## 2011-06-22 MED ORDER — CHLORHEXIDINE GLUCONATE 4 % EX LIQD: 1.0000 "application " | Freq: Once | CUTANEOUS | Status: DC

## 2011-06-22 NOTE — Pre-Procedure Instructions (Signed)
[place]  06/22/2011   Your procedure is scheduled on:  Tues, Feb 12 @ 0715  Report to [facility] at [facility].  Call this number if you have problems the morning of surgery: [phone]   Remember:   Do not eat food:After Midnight.  May have clear liquids: up to 4 Hours before arrival.(until 1:30 am)  Clear liquids include soda, tea, black coffee, apple or grape juice, broth.  Take these medicines the morning of surgery with A SIP OF WATER: Metoprolol   Do not wear jewelry, make-up or nail polish.  Do not wear lotions, powders, or perfumes. You may wear deodorant.  Do not shave 48 hours prior to surgery.  Do not bring valuables to the hospital.  Contacts, dentures or bridgework may not be worn into surgery.  Leave suitcase in the car. After surgery it may be brought to your room.  For patients admitted to the hospital, checkout time is 11:00 AM the day of discharge.   Patients discharged the day of surgery will not be allowed to drive home.  Name and phone number of your driver:   Special Instructions: CHG Shower Use Special Wash: 1/2 bottle night before surgery and 1/2 bottle morning of surgery.   Please read over the following fact sheets that you were given: Pain Booklet, Coughing and Deep Breathing, Blood Transfusion Information, MRSA Information and Surgical Site Infection Prevention

## 2011-06-22 NOTE — Progress Notes (Signed)
c diff after CABG in 11/2010 and perforated bowel;this is why pt ended up with colostomy

## 2011-06-22 NOTE — Progress Notes (Signed)
Stress test and echo in epic from 08/25/09  Heart cath in 2012  Dr.Mclean is cardiologist;last visit Dec 2012-and is aware pt is having surgery;follow up in Mar 2013

## 2011-06-26 MED ORDER — METRONIDAZOLE IN NACL 5-0.79 MG/ML-% IV SOLN
500.0000 mg | INTRAVENOUS | Status: AC
  Administered 2011-06-27: 500 mg via INTRAVENOUS
  Filled 2011-06-26: qty 100

## 2011-06-26 MED ORDER — ALVIMOPAN 12 MG PO CAPS
12.0000 mg | ORAL_CAPSULE | Freq: Once | ORAL | Status: AC
  Administered 2011-06-27: 12 mg via ORAL
  Filled 2011-06-26 (×2): qty 1

## 2011-06-27 ENCOUNTER — Inpatient Hospital Stay (HOSPITAL_COMMUNITY)
Admission: RE | Admit: 2011-06-27 | Discharge: 2011-07-02 | DRG: 346 | Disposition: A | Discharge status: Home / Self Care | Payer: Medicare Other | Source: Ambulatory Visit | Attending: General Surgery | Admitting: General Surgery

## 2011-06-27 ENCOUNTER — Ambulatory Visit (HOSPITAL_COMMUNITY): Payer: Medicare Other | Admitting: Anesthesiology

## 2011-06-27 ENCOUNTER — Encounter (HOSPITAL_COMMUNITY): Payer: Self-pay | Admitting: General Practice

## 2011-06-27 ENCOUNTER — Encounter (HOSPITAL_COMMUNITY): Payer: Self-pay | Admitting: Anesthesiology

## 2011-06-27 ENCOUNTER — Encounter (HOSPITAL_COMMUNITY): Payer: Self-pay | Admitting: Vascular Surgery

## 2011-06-27 ENCOUNTER — Encounter (HOSPITAL_COMMUNITY)
Admission: RE | Disposition: A | Discharge status: Home / Self Care | Payer: Self-pay | Source: Ambulatory Visit | Attending: General Surgery

## 2011-06-27 ENCOUNTER — Encounter (HOSPITAL_COMMUNITY): Payer: Self-pay | Admitting: *Deleted

## 2011-06-27 DIAGNOSIS — E785 Hyperlipidemia, unspecified: Secondary | ICD-10-CM

## 2011-06-27 DIAGNOSIS — E782 Mixed hyperlipidemia: Secondary | ICD-10-CM | POA: Diagnosis present

## 2011-06-27 DIAGNOSIS — R079 Chest pain, unspecified: Secondary | ICD-10-CM

## 2011-06-27 DIAGNOSIS — Z433 Encounter for attention to colostomy: Secondary | ICD-10-CM

## 2011-06-27 DIAGNOSIS — A0472 Enterocolitis due to Clostridium difficile, not specified as recurrent: Secondary | ICD-10-CM

## 2011-06-27 DIAGNOSIS — I2510 Atherosclerotic heart disease of native coronary artery without angina pectoris: Secondary | ICD-10-CM

## 2011-06-27 DIAGNOSIS — D649 Anemia, unspecified: Secondary | ICD-10-CM

## 2011-06-27 DIAGNOSIS — Z951 Presence of aortocoronary bypass graft: Secondary | ICD-10-CM

## 2011-06-27 DIAGNOSIS — Z01812 Encounter for preprocedural laboratory examination: Secondary | ICD-10-CM

## 2011-06-27 DIAGNOSIS — I4891 Unspecified atrial fibrillation: Secondary | ICD-10-CM

## 2011-06-27 DIAGNOSIS — I252 Old myocardial infarction: Secondary | ICD-10-CM

## 2011-06-27 DIAGNOSIS — Z933 Colostomy status: Secondary | ICD-10-CM

## 2011-06-27 DIAGNOSIS — Z7982 Long term (current) use of aspirin: Secondary | ICD-10-CM

## 2011-06-27 DIAGNOSIS — Z888 Allergy status to other drugs, medicaments and biological substances status: Secondary | ICD-10-CM

## 2011-06-27 DIAGNOSIS — I491 Atrial premature depolarization: Secondary | ICD-10-CM

## 2011-06-27 DIAGNOSIS — I10 Essential (primary) hypertension: Secondary | ICD-10-CM | POA: Diagnosis present

## 2011-06-27 DIAGNOSIS — K219 Gastro-esophageal reflux disease without esophagitis: Secondary | ICD-10-CM | POA: Diagnosis present

## 2011-06-27 HISTORY — DX: Anemia, unspecified: D64.9

## 2011-06-27 HISTORY — DX: Personal history of other diseases of the digestive system: Z87.19

## 2011-06-27 HISTORY — PX: PROCTOSCOPY: SHX2266

## 2011-06-27 HISTORY — DX: Personal history of other diseases of the respiratory system: Z87.09

## 2011-06-27 HISTORY — PX: OTHER SURGICAL HISTORY: SHX169

## 2011-06-27 HISTORY — DX: Unspecified convulsions: R56.9

## 2011-06-27 SURGERY — COLOSTOMY REVERSAL
Anesthesia: General | Site: Rectum | Wound class: Clean Contaminated

## 2011-06-27 MED ORDER — FENTANYL CITRATE 0.05 MG/ML IJ SOLN
INTRAMUSCULAR | Status: DC | PRN
  Administered 2011-06-27: 25 ug via INTRAVENOUS
  Administered 2011-06-27 (×5): 50 ug via INTRAVENOUS

## 2011-06-27 MED ORDER — PROMETHAZINE HCL 25 MG/ML IJ SOLN
INTRAMUSCULAR | Status: AC
  Administered 2011-06-27: 6.25 mg via INTRAVENOUS
  Filled 2011-06-27: qty 1

## 2011-06-27 MED ORDER — 0.9 % SODIUM CHLORIDE (POUR BTL) OPTIME
TOPICAL | Status: DC | PRN
  Administered 2011-06-27: 4000 mL

## 2011-06-27 MED ORDER — ACETAMINOPHEN 10 MG/ML IV SOLN
INTRAVENOUS | Status: AC
  Filled 2011-06-27: qty 100

## 2011-06-27 MED ORDER — HEPARIN SODIUM (PORCINE) 5000 UNIT/ML IJ SOLN
5000.0000 [IU] | Freq: Three times a day (TID) | INTRAMUSCULAR | Status: DC
  Administered 2011-06-28 – 2011-07-02 (×13): 5000 [IU] via SUBCUTANEOUS
  Filled 2011-06-27 (×16): qty 1

## 2011-06-27 MED ORDER — PROMETHAZINE HCL 25 MG/ML IJ SOLN
6.2500 mg | INTRAMUSCULAR | Status: DC | PRN
  Administered 2011-06-27: 6.25 mg via INTRAVENOUS

## 2011-06-27 MED ORDER — HETASTARCH-ELECTROLYTES 6 % IV SOLN
INTRAVENOUS | Status: DC | PRN
  Administered 2011-06-27: 09:00:00 via INTRAVENOUS

## 2011-06-27 MED ORDER — ROCURONIUM BROMIDE 100 MG/10ML IV SOLN
INTRAVENOUS | Status: DC | PRN
  Administered 2011-06-27: 50 mg via INTRAVENOUS

## 2011-06-27 MED ORDER — METOPROLOL SUCCINATE ER 25 MG PO TB24
25.0000 mg | ORAL_TABLET | Freq: Every day | ORAL | Status: DC
  Administered 2011-06-29 – 2011-07-01 (×3): 25 mg via ORAL
  Filled 2011-06-27 (×6): qty 1

## 2011-06-27 MED ORDER — ONDANSETRON HCL 4 MG/2ML IJ SOLN
4.0000 mg | Freq: Four times a day (QID) | INTRAMUSCULAR | Status: DC | PRN
  Administered 2011-06-27: 4 mg via INTRAVENOUS

## 2011-06-27 MED ORDER — VECURONIUM BROMIDE 10 MG IV SOLR
INTRAVENOUS | Status: DC | PRN
  Administered 2011-06-27: 3 mg via INTRAVENOUS
  Administered 2011-06-27: 1 mg via INTRAVENOUS

## 2011-06-27 MED ORDER — LIDOCAINE HCL (CARDIAC) 20 MG/ML IV SOLN
INTRAVENOUS | Status: DC | PRN
  Administered 2011-06-27: 50 mg via INTRAVENOUS

## 2011-06-27 MED ORDER — CIPROFLOXACIN IN D5W 400 MG/200ML IV SOLN
INTRAVENOUS | Status: DC | PRN
  Administered 2011-06-27: 400 mg via INTRAVENOUS

## 2011-06-27 MED ORDER — ALVIMOPAN 12 MG PO CAPS
12.0000 mg | ORAL_CAPSULE | Freq: Two times a day (BID) | ORAL | Status: DC
  Administered 2011-06-28 – 2011-07-01 (×8): 12 mg via ORAL
  Filled 2011-06-27 (×11): qty 1

## 2011-06-27 MED ORDER — HYDROMORPHONE HCL PF 1 MG/ML IJ SOLN
0.2500 mg | INTRAMUSCULAR | Status: DC | PRN
  Administered 2011-06-27 (×2): 0.5 mg via INTRAVENOUS

## 2011-06-27 MED ORDER — NEOSTIGMINE METHYLSULFATE 1 MG/ML IJ SOLN
INTRAMUSCULAR | Status: DC | PRN
  Administered 2011-06-27: 5 mg via INTRAVENOUS

## 2011-06-27 MED ORDER — PROPOFOL 10 MG/ML IV EMUL
INTRAVENOUS | Status: DC | PRN
  Administered 2011-06-27: 120 mg via INTRAVENOUS

## 2011-06-27 MED ORDER — MIDAZOLAM HCL 2 MG/2ML IJ SOLN: 1.0000 mg | INTRAMUSCULAR | Status: DC | PRN

## 2011-06-27 MED ORDER — GLYCOPYRROLATE 0.2 MG/ML IJ SOLN
INTRAMUSCULAR | Status: DC | PRN
  Administered 2011-06-27: 0.2 mg via INTRAVENOUS
  Administered 2011-06-27: .6 mg via INTRAVENOUS

## 2011-06-27 MED ORDER — DIPHENHYDRAMINE HCL 50 MG/ML IJ SOLN: 12.5000 mg | Freq: Four times a day (QID) | INTRAMUSCULAR | Status: DC | PRN

## 2011-06-27 MED ORDER — DIPHENHYDRAMINE HCL 12.5 MG/5ML PO ELIX
12.5000 mg | ORAL_SOLUTION | Freq: Four times a day (QID) | ORAL | Status: DC | PRN
  Filled 2011-06-27: qty 5

## 2011-06-27 MED ORDER — SODIUM CHLORIDE 0.9 % IJ SOLN: 9.0000 mL | INTRAMUSCULAR | Status: DC | PRN

## 2011-06-27 MED ORDER — ONDANSETRON HCL 4 MG PO TABS: 4.0000 mg | ORAL_TABLET | Freq: Four times a day (QID) | ORAL | Status: DC | PRN

## 2011-06-27 MED ORDER — ONDANSETRON HCL 4 MG/2ML IJ SOLN
INTRAMUSCULAR | Status: DC | PRN
  Administered 2011-06-27: 4 mg via INTRAVENOUS

## 2011-06-27 MED ORDER — CIPROFLOXACIN IN D5W 400 MG/200ML IV SOLN
INTRAVENOUS | Status: AC
  Filled 2011-06-27: qty 200

## 2011-06-27 MED ORDER — ACETAMINOPHEN 10 MG/ML IV SOLN
INTRAVENOUS | Status: DC | PRN
  Administered 2011-06-27: 1000 mg via INTRAVENOUS

## 2011-06-27 MED ORDER — LACTATED RINGERS IV SOLN
INTRAVENOUS | Status: DC | PRN
  Administered 2011-06-27 (×2): via INTRAVENOUS

## 2011-06-27 MED ORDER — DEXAMETHASONE SODIUM PHOSPHATE 10 MG/ML IJ SOLN
INTRAMUSCULAR | Status: DC | PRN
  Administered 2011-06-27: 4 mg via INTRAVENOUS

## 2011-06-27 MED ORDER — NALOXONE HCL 0.4 MG/ML IJ SOLN: 0.4000 mg | INTRAMUSCULAR | Status: DC | PRN

## 2011-06-27 MED ORDER — MIDAZOLAM HCL 5 MG/5ML IJ SOLN
INTRAMUSCULAR | Status: DC | PRN
  Administered 2011-06-27 (×2): 1 mg via INTRAVENOUS

## 2011-06-27 MED ORDER — PANTOPRAZOLE SODIUM 40 MG IV SOLR
40.0000 mg | INTRAVENOUS | Status: DC
  Administered 2011-06-27 – 2011-06-29 (×3): 40 mg via INTRAVENOUS
  Filled 2011-06-27 (×4): qty 40

## 2011-06-27 MED ORDER — LORAZEPAM 2 MG/ML IJ SOLN: 1.0000 mg | Freq: Once | INTRAMUSCULAR | Status: DC | PRN

## 2011-06-27 MED ORDER — MORPHINE SULFATE (PF) 1 MG/ML IV SOLN
INTRAVENOUS | Status: DC
  Administered 2011-06-27: 12:00:00 via INTRAVENOUS
  Administered 2011-06-27: 1 mg via INTRAVENOUS
  Administered 2011-06-27: 5 mg via INTRAVENOUS
  Administered 2011-06-28: 4 mg via INTRAVENOUS
  Administered 2011-06-28: 3 mg via INTRAVENOUS
  Administered 2011-06-28 – 2011-06-29 (×4): 2 mg via INTRAVENOUS
  Administered 2011-06-29: 1 mg via INTRAVENOUS
  Administered 2011-06-29: 10:00:00 via INTRAVENOUS
  Administered 2011-06-29 (×2): 3 mg via INTRAVENOUS
  Administered 2011-06-29 – 2011-06-30 (×3): 2 mg via INTRAVENOUS
  Administered 2011-06-30: 1 mg via INTRAVENOUS
  Filled 2011-06-27: qty 25

## 2011-06-27 MED ORDER — KCL IN DEXTROSE-NACL 20-5-0.45 MEQ/L-%-% IV SOLN
INTRAVENOUS | Status: DC
  Administered 2011-06-27: 100 mL/h via INTRAVENOUS
  Administered 2011-06-28 (×3): via INTRAVENOUS
  Administered 2011-06-29: 75 mL/h via INTRAVENOUS
  Administered 2011-06-29 – 2011-06-30 (×2): via INTRAVENOUS
  Filled 2011-06-27 (×10): qty 1000

## 2011-06-27 MED ORDER — FENTANYL CITRATE 0.05 MG/ML IJ SOLN: 50.0000 ug | INTRAMUSCULAR | Status: DC | PRN

## 2011-06-27 MED ORDER — ACETAMINOPHEN 10 MG/ML IV SOLN
1000.0000 mg | Freq: Four times a day (QID) | INTRAVENOUS | Status: AC
  Administered 2011-06-27 – 2011-06-28 (×3): 1000 mg via INTRAVENOUS
  Filled 2011-06-27 (×4): qty 100

## 2011-06-27 SURGICAL SUPPLY — 71 items
BLADE SURG ROTATE 9660 (MISCELLANEOUS) ×3 IMPLANT
CANISTER SUCTION 2500CC (MISCELLANEOUS) ×3 IMPLANT
CATH URET ROBINSON 24FR STRL (CATHETERS) ×3 IMPLANT
CHLORAPREP W/TINT 26ML (MISCELLANEOUS) ×3 IMPLANT
CLIP TI LARGE 6 (CLIP) IMPLANT
CLIP TI MEDIUM 6 (CLIP) IMPLANT
CLOTH BEACON ORANGE TIMEOUT ST (SAFETY) ×3 IMPLANT
COVER SURGICAL LIGHT HANDLE (MISCELLANEOUS) ×3 IMPLANT
DRAPE INCISE IOBAN 66X45 STRL (DRAPES) ×3 IMPLANT
DRAPE LAPAROSCOPIC ABDOMINAL (DRAPES) ×3 IMPLANT
DRAPE PROXIMA HALF (DRAPES) ×9 IMPLANT
DRAPE UTILITY 15X26 W/TAPE STR (DRAPE) ×6 IMPLANT
DRAPE WARM FLUID 44X44 (DRAPE) ×3 IMPLANT
DRESSING TELFA 8X3 (GAUZE/BANDAGES/DRESSINGS) ×3 IMPLANT
ELECT BLADE 6.5 EXT (BLADE) ×3 IMPLANT
ELECT CAUTERY BLADE 6.4 (BLADE) ×3 IMPLANT
ELECT REM PT RETURN 9FT ADLT (ELECTROSURGICAL) ×3
ELECTRODE REM PT RTRN 9FT ADLT (ELECTROSURGICAL) ×2 IMPLANT
GLOVE BIO SURGEON STRL SZ7.5 (GLOVE) ×6 IMPLANT
GLOVE BIOGEL M STRL SZ7.5 (GLOVE) ×12 IMPLANT
GLOVE BIOGEL PI IND STRL 7.0 (GLOVE) ×4 IMPLANT
GLOVE BIOGEL PI IND STRL 7.5 (GLOVE) ×8 IMPLANT
GLOVE BIOGEL PI IND STRL 8 (GLOVE) ×8 IMPLANT
GLOVE BIOGEL PI INDICATOR 7.0 (GLOVE) ×2
GLOVE BIOGEL PI INDICATOR 7.5 (GLOVE) ×4
GLOVE BIOGEL PI INDICATOR 8 (GLOVE) ×4
GLOVE SURG SS PI 6.5 STRL IVOR (GLOVE) ×3 IMPLANT
GLOVE SURG SS PI 7.5 STRL IVOR (GLOVE) ×3 IMPLANT
GOWN STRL NON-REIN LRG LVL3 (GOWN DISPOSABLE) ×6 IMPLANT
GOWN STRL REIN XL XLG (GOWN DISPOSABLE) ×12 IMPLANT
KIT BASIN OR (CUSTOM PROCEDURE TRAY) ×3 IMPLANT
KIT ROOM TURNOVER OR (KITS) ×3 IMPLANT
LIGASURE IMPACT 36 18CM CVD LR (INSTRUMENTS) ×3 IMPLANT
NS IRRIG 1000ML POUR BTL (IV SOLUTION) ×12 IMPLANT
PACK GENERAL/GYN (CUSTOM PROCEDURE TRAY) ×3 IMPLANT
PAD ARMBOARD 7.5X6 YLW CONV (MISCELLANEOUS) ×6 IMPLANT
RELOAD PROXIMATE 75MM BLUE (ENDOMECHANICALS) ×3 IMPLANT
RETRACTOR WND ALEXIS 25 LRG (MISCELLANEOUS) ×2 IMPLANT
RTRCTR WOUND ALEXIS 25CM LRG (MISCELLANEOUS) ×3
SEPRAFILM PROCEDURAL PACK 3X5 (MISCELLANEOUS) ×3 IMPLANT
SLEEVE SURGEON STRL (DRAPES) ×6 IMPLANT
SPECIMEN JAR X LARGE (MISCELLANEOUS) ×3 IMPLANT
SPONGE GAUZE 4X4 12PLY (GAUZE/BANDAGES/DRESSINGS) IMPLANT
SPONGE LAP 18X18 X RAY DECT (DISPOSABLE) ×3 IMPLANT
STAPLER CIRC CVD 29MM 37CM (STAPLE) ×3 IMPLANT
STAPLER PROXIMATE 75MM BLUE (STAPLE) ×3 IMPLANT
STAPLER VISISTAT 35W (STAPLE) ×3 IMPLANT
SUCTION POOLE TIP (SUCTIONS) ×3 IMPLANT
SUT NOVA 1 T20/GS 25DT (SUTURE) ×3 IMPLANT
SUT PDS AB 1 CTX 36 (SUTURE) IMPLANT
SUT PDS AB 1 TP1 96 (SUTURE) ×6 IMPLANT
SUT PDS AB 3-0 SH 27 (SUTURE) IMPLANT
SUT PROLENE 2 0 CT2 30 (SUTURE) ×3 IMPLANT
SUT PROLENE 2 0 KS (SUTURE) ×3 IMPLANT
SUT PROLENE 2 0 SH DA (SUTURE) ×3 IMPLANT
SUT SILK 2 0 SH CR/8 (SUTURE) IMPLANT
SUT SILK 2 0 TIES 10X30 (SUTURE) ×3 IMPLANT
SUT SILK 3 0 SH CR/8 (SUTURE) ×6 IMPLANT
SUT SILK 3 0 TIES 10X30 (SUTURE) ×3 IMPLANT
SUT VIC AB 2-0 SH 18 (SUTURE) ×3 IMPLANT
SUT VIC AB 3-0 SH 27 (SUTURE)
SUT VIC AB 3-0 SH 27X BRD (SUTURE) IMPLANT
SYR TOOMEY 50ML (SYRINGE) ×3 IMPLANT
TAPE CLOTH SURG 4X10 WHT LF (GAUZE/BANDAGES/DRESSINGS) ×3 IMPLANT
TOWEL OR 17X24 6PK STRL BLUE (TOWEL DISPOSABLE) ×3 IMPLANT
TOWEL OR 17X26 10 PK STRL BLUE (TOWEL DISPOSABLE) ×3 IMPLANT
TRAY FOLEY CATH 14FRSI W/METER (CATHETERS) ×3 IMPLANT
TRAY PROCTOSCOPIC FIBER OPTIC (SET/KITS/TRAYS/PACK) ×6 IMPLANT
UNDERPAD 30X30 INCONTINENT (UNDERPADS AND DIAPERS) ×6 IMPLANT
WATER STERILE IRR 1000ML POUR (IV SOLUTION) ×3 IMPLANT
YANKAUER SUCT BULB TIP NO VENT (SUCTIONS) ×3 IMPLANT

## 2011-06-27 NOTE — H&P (View-Only) (Signed)
Patient ID: [name], female   DOB: Aug 14, 1940, 71 y.o.   MRN: [id number]  Chief Complaint  Patient presents with  . Follow-up    reck abd wd    HPI [name] is a 71 y.o. female.   HPI 71 year old Caucasian female comes in for long-term followup. I last saw her on April 13, 2011. She had underwent a an emergency exploratory laparotomy, sigmoid colectomy with end colostomy for perforated C. difficile colitis on December 18, 2010. She comes in today to discuss ostomy reversal. She states she has been doing well. She is still going to cardiac rehabilitation. She states that her energy levels are normal. She states that her appetite is great. She denies any fevers, chills, nausea, vomiting, weight loss, chest pain, shortness of breath. She has not had any problems with her ostomy. She will occasionally pass some mucus from her rectum.  Past Medical History  Diagnosis Date  . Hypertension   . Coronary artery disease     a. NSTEMI 7/12, cath: pLAD 30%, mLAD 60-70%, dLAD 80%, oOM2 50%, mOM2 40%, trifurcation 80%, pRCA 25%, then occluded, L-R collats, EF 55%;   b. 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 Roxy Manns)  . Hyperlipidemia   . Pericarditis 1980    hx of  . GERD (gastroesophageal reflux disease)   . Palpitations 2003    hx of; Holter in 2003 with PACs and PVCs  . Atrial fibrillation     post-op after CABG;  treated with Amiodarone and coumadin until readmxn with CDiff colitis, perf colon and elevated LFTs (amio and coumadin stopped)  . C. difficile colitis 08/979    complicated post CABG course with prolonged admission, perforated sigmoid colon;  s/p sigmoid colectomy with end colostomy [name] procedure);  laparotomy wound infection with Pseudomonas treated with antibxs and secondary intention  . 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 Surgical wound infection     Pseudomonas; wound VAC; secondary intention  . 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 Hair loss     Past Surgical History  Procedure Date  . Appendectomy   . Oophorectomy     unilateral  . Salpingectomy   . Hemorrhoid surgery     x2  . Sigmoidectomy 12/18/2010    with end colostomy  . Coronary artery bypass graft 11-25-2010    LIMA to LAD, SVG to PDA, SVG to OM, EVH via right thigh    Family History  Problem Relation Age of Onset  . Heart attack Father 17    died  . Heart disease Father     massive heart attack  . Coronary artery disease Mother 52    [place]  . Heart disease Mother     Social History History  Substance Use Topics  . Smoking status: Never Smoker   . Smokeless tobacco: Not on file  . Alcohol Use: No    Allergies  Allergen Reactions  . Nadolol   . Sulfonamide Derivatives   . Verapamil     Current Outpatient Prescriptions  Medication Sig Dispense Refill  . aspirin 81 MG chewable tablet Chew 81 mg by mouth daily.        . Bacillus Coagulans-Inulin (PROBIOTIC-PREBIOTIC) 1-250 BILLION-MG CAPS Take by mouth daily.        . Calcium Carbonate-Vitamin D 600-400 MG-UNIT per tablet Take 1 tablet by mouth daily.        Marland Kitchen  Coenzyme Q10 200 MG TABS Take by mouth.    0  . metoprolol succinate (TOPROL-XL) 25 MG 24 hr tablet Take 1 tablet (25 mg total) by mouth daily.  30 tablet  11  . Multiple Vitamin (MULTIVITAMIN) tablet Take 1 tablet by mouth daily.        . pravastatin (PRAVACHOL) 20 MG tablet Take 1 tablet (20 mg total) by mouth every evening.  30 tablet  3  . senna (SENOKOT) 8.6 MG tablet Take 1 tablet by mouth 2 (two) times daily.        . vitamin C (ASCORBIC ACID) 500 MG tablet Take 500 mg by mouth daily.        . polyethylene glycol powder (MIRALAX) powder Take 255 g by mouth once.  255 g  0    Review of Systems Review of Systems  Constitutional: Negative for fever, chills, fatigue and unexpected weight change.  HENT: Negative for hearing loss, congestion, sore throat, trouble swallowing and  voice change.   Eyes: Negative for visual disturbance.  Respiratory: Negative for cough, chest tightness, shortness of breath and wheezing.        Denies DOE, PND, orthopnea  Cardiovascular: Negative for chest pain, palpitations and leg swelling.  Gastrointestinal: Negative for nausea, vomiting, abdominal pain, diarrhea and abdominal distention.  Genitourinary: Negative for hematuria, vaginal bleeding and difficulty urinating.  Musculoskeletal: Negative for arthralgias.  Skin: Negative for rash and wound.  Neurological: Negative for seizures, syncope and headaches.  Hematological: Negative for adenopathy. Does not bruise/bleed easily.  Psychiatric/Behavioral: Negative for confusion.    Blood pressure 136/66, pulse 64, temperature 98.3 F (36.8 C), temperature source Temporal, resp. rate 18, height 5' 6.5" (1.689 m), weight 139 lb 6.4 oz (63.231 kg).  Physical Exam Physical Exam  Vitals reviewed. Constitutional: She is oriented to person, place, and time. She appears well-developed and well-nourished. No distress.  HENT:  Head: Normocephalic and atraumatic.  Eyes: Conjunctivae are normal. No scleral icterus.  Neck: Normal range of motion. Neck supple. No tracheal deviation present. No thyromegaly present.  Cardiovascular: Normal rate, regular rhythm and normal heart sounds.   Pulmonary/Chest: Effort normal and breath sounds normal. No respiratory distress. She has no wheezes.  Abdominal: Soft. Bowel sounds are normal. She exhibits no distension. There is no tenderness.         Midline incision; LLQ ostomy-no para-stomal hernia. No evid of incisional hernia. Ostomy-pink/viable  Musculoskeletal: Normal range of motion. She exhibits no edema and no tenderness.  Neurological: She is alert and oriented to person, place, and time. She exhibits normal muscle tone.  Skin: Skin is warm and dry. No rash noted. She is not diaphoretic. No erythema.  Psychiatric: She has a normal mood and affect.  Her behavior is normal. Judgment and thought content normal.    Data Reviewed Last office note Dr Guy Sandifer note  Assessment    H/o perforated sigmoid colon secondary to C Diff colitis s/p Hartmann's procedure    Plan    We are approaching 6 months since the patients perforation.  The patient has requested ostomy reversal which I believe his reasonable.   I discussed the procedure in detail.  The patient was given Neurosurgeon.  We discussed the risks and benefits of surgery including, but not limited to bleeding, infection (such as wound infection, abdominal abscess), injury to surrounding structures, blood clot formation, urinary retention, incisional hernia, anastomotic stricture, anastomotic leak, anesthesia risks, pulmonary & cardiac complications such as pneumonia &/or heart attack, need  for additional procedures, ileus, & prolonged hospitalization.  We discussed the typical postoperative recovery course, including limitations & restrictions postoperatively. I explained that the likelihood of improvement in their symptoms is good.  We will get an contrasted enema prior to reversal to make sure there is no distal stricture.  We will plan on perioperative Entereg.  We will plan gentle 2 day bowel prep with liquids x 2 days, and miralax.   PLAN: COLOSTOMY REVERSAL, RIGID PROCTOSCOPY       [name] M 06/07/2011, 1:34 PM

## 2011-06-27 NOTE — Interval H&P Note (Signed)
History and Physical Interval Note:  06/27/2011 7:16 AM  [name]  has presented today for surgery, with the diagnosis of history hartmanns procedure / undesired colostomy   The various methods of treatment have been discussed with the patient and family. After consideration of risks, benefits and other options for treatment, the patient has consented to  Procedure(s) (LRB): COLOSTOMY REVERSAL (N/A) PROCTOSCOPY (N/A) as a surgical intervention .  The patients' history has been reviewed, patient examined, no change in status, stable for surgery.  I have reviewed the patients' chart and labs.  Questions were answered to the patient's satisfaction.     [name]. [name], MD, [place], Bariatric, & Minimally Invasive Surgery [facility], [place]  [facility]

## 2011-06-27 NOTE — Preoperative (Signed)
Beta Blockers   Reason not to administer Beta Blockers:Not Applicable 

## 2011-06-27 NOTE — Op Note (Signed)
NAMECHERICA, HEIDEN NO.:  [id number]  MEDICAL RECORD NO.:  [id number]  LOCATION:  2671                         FACILITY:  [facility]  PHYSICIAN:  [name]. [name], MD     DATE OF BIRTH:  1940/10/30  DATE OF PROCEDURE:  06/27/2011 DATE OF DISCHARGE:                              OPERATIVE REPORT   PREOPERATIVE DIAGNOSES: 1. 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 2. Undesired end colostomy.  POSTOPERATIVE DIAGNOSIS: 1. 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 2. Undesired end colostomy.  PROCEDURE: 1. Colostomy reversal. 2. Rigid proctoscopy.  SURGEON:  [name]. [name], MD, FACS  ASSISTANT SURGEON:  Madilyn Hook, MD, FACS.  ANESTHESIA:  General.  SPECIMEN:  Colostomy which was discarded.  EBL:  100 mL.  FINDINGS:  The colon was reanastomosed to the rectum in an end-to-end fashion using a 29 mm EEA stapler.  The anastomosis was approximately at 14 cm from anal verge.  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  She ended up perforating requiring emergency surgery in early August 2012.  She ended up having Hartmann's procedure.  Over the past several months, the patient has gradually returned to her baseline function and she has satisfactorily completed cardiac rehab.  It has been 6 months since her procedure and she was medically and clinically stable for reversal.  Barium enema demonstrated no leakage from the rectal stump and no stricturing of the rectal stump.  We discussed the risks and benefits of surgery including, but not limited to, bleeding, infection, injury to surrounding structures, anastomotic stricture, anastomotic leakage, intra-abdominal abscess, surgical wound site infection, incisional hernia, urinary retention, urinary tract infection, blood clot  formation, general anesthesia risk, prolonged ileus, prolonged hospitalization, and injury to surrounding structures.  The patient agreed to proceed to the operating room.  After obtaining informed consent, the patient was taken to the operating room, placed supine on the operating table.  General endotracheal anesthesia was established.  The patient had received a dose of Entereg preoperatively.  She was then placed in lithotomy position.  Foley catheter was placed.  I closed her ostomy at the skin level with a running 2-0 silk suture.  Her perineum was prepped with Betadine.  Her abdomen was prepped with ChloraPrep.  She was then draped.  She received Cipro and Flagyl prior to skin incision.  She received IV Tylenol as well.  A surgical time-out was performed.  Charlie Pitter was also placed over the drape.  I incised the skin through her lower midline scar with a #10 blade and carried it slightly above her umbilicus.  The subcutaneous tissue was divided with electrocautery and the abdominal cavity was entered.  There were no intra-abdominal adhesions to the anterior abdominal wall as well as no intra-abdominal adhesions.  The colon going up through the fascial defect was identified.  I elected to transect the colon just at the level of the abdominal wall with a linear cutter GIA stapler 75 mm blue load.  An Alexis wound protector had been placed and a Bookwalter retractor was  placed for retraction purposes.  The small bowel was packed up out of the pelvis.  There was one to two adhesions of the small bowel to the sacral promontory.  These were lysed with Metzenbaum scissors.  The rectal stump was identified.  The 2 Prolene tacks had been placed were removed.  The rectal stump appeared mobile without any side adhesions, and it did not appear to be folded onto itself.  There was plenty of length from the proximal segment of the colon down to the rectal stump.  I did end up cleaning the  proximal segment free of some epiploic appendages with Bovie electrocautery with the aid of hemostat.  The purse-string clamp was placed about a cm and a half below the staple line and a 2-0 Prolene suture on a Keith needle was placed through the purse-string clamp and the staple line was sharply excised with a pair of curved Mayo's.  The purse-string of the Prolene was not completely through the colon.  The suture had not made a complete purse-string, therefore, it was removed and discarded. There were a few bleeders right along the edge of the mesentery.  These were oversewn with 3-0 silk sutures x2.  The transected proximal end was viable.  I, therefore, then used 2-0 Prolene and then made a baseball stitch around the opening.  I then sized it with dilators.  I started with a 25 mm dilator first, it was little bit tight, but I left it in there for a few minutes to stretch.  I was then able to pass a 29 mm Sizer.  We opened the 29 mm Ethicon EEA stapler.  I placed the anvil through the opening of the proximal segment and then tied down the Prolene pursestring, thus, securing the anvil into the proximal segment.  At this point, I went below and dilated anus with my fingers.  I then placed the 25 mm dilator and then the 29 mm dilator up the anus into the rectum.  My partner was able to see the distended rectum with the Sizer in it.  I then passed the 29 mm EEA stapler up through the anus into the rectum and advanced it to the staple line of the rectal stump.  Once we were satisfied that there is nothing in the way including the vagina, I advanced the spike just anterior to the staple line.  My partner then took the anvil in the proximal segment and attached it to the stapler despite ensuring that the colon was not twisted on its mesentery.  The stapler was closed and then fired.  Once the anastomosis had been stapled, I opened the stapler and removed it from the anus and the rectum.  I  confirmed there were 2 circumferential intact donuts.  I then reinserted the rigid proctoscope into the rectum under direct visualization and advanced it up to the anastomosis, which was around 14 cm.  I gently withdrew the scope a little bit and then insufflated the rectum with air, while my partner had placed saline into the abdominal cavity.  On initial insufflation, there was 1 bubble of air seen anteriorly.  We then re-insufflated the rectum again and I could visualize the staple line with the rigid proctoscope and then retracted it slightly so that I was proximal to the staple line, and then we again insufflated.  At this point, there were no air bubbles.  I continued to insufflate and distend the anastomosis in the rectum for a minute or  two.  While both my partner and I now looked and we saw no additional air bubbles.  I was able to advance the scope, pass the anastomosis and then withdrew it again slightly and re-insufflated again.  There is no air bubble saline.  Because my partner had seen one air bubble anteriorly, I scrubbed back in and we placed several interrupted 2-0 silk sutures along the anterior aspect of the anastomosis using Lembert-type sutures. At this point, I went back below and performed another rigid proctoscopy and insufflated the rectum and at this point, there were no air bubbles seen again.  I scrubbed back in.  We irrigated the pelvis.  Lap pads were removed.  Then, the Bookwalter retractor was taken down.  At this point, I made a circumferential incision around the old ostomy in the left mid abdominal wall with Bovie electrocautery.  The subcutaneous tissue was separated from the colon.  It was then taken down to the level of the fascia and colon was freed from the fascia and passed off the field.  We inspected to make sure there was no residual colon within the fascial defect. based on the anatomy of how the fascia appeared at the ostomy defect it appeared  it would come together most easily by closing it vertically instead of transversely. Using 5 interrupted 0 Novafil, the fascial defect was closed.  I confirmed that there was no gap by palpating along the inside of the abdominal wall.  I then closed some of the anterior fascia with interrupted Vicryl sutures.  The omentum was then draped back over the intestine.  I placed Kocher's on each side of the fascia and placed 6 sheets of Seprafilm in the abdominal cavity as well as in the left lower quadrant.  The fascia was reapproximated with running #1 looped PDS one from above and one from below.  The skin was reapproximated with skin staples and Telfa strips were placed in between some of the skin staples.  The ostomy defect was irrigated and the skin was partially closed with skin staples and Telfa wicks, 4x4s, and sterile dressings were applied.  All needle, instrument, and sponge counts were correct x2.  The patient tolerated procedure well.  She was extubated and taken to recovery room in stable condition.     [name]. [name], MD, FACS     EMW/MEDQ  D:  06/27/2011  T:  06/27/2011  Job:  [id number]  cc:   Loralie Champagne, MD Valentina Gu. Roxy Manns, M.D. Minus Breeding, MD, [facility]

## 2011-06-27 NOTE — Anesthesia Postprocedure Evaluation (Signed)
  Anesthesia Post-op Note  Patient: [name]  Procedure(s) Performed: Procedure(s) (LRB): COLOSTOMY REVERSAL (N/A) PROCTOSCOPY (N/A)  Patient Location: PACU  Anesthesia Type: General  Level of Consciousness: awake  Airway and Oxygen Therapy: Patient Spontanous Breathing  Post-op Pain: mild  Post-op Assessment: Post-op Vital signs reviewed  Post-op Vital Signs: stable  Complications: No apparent anesthesia complications

## 2011-06-27 NOTE — Brief Op Note (Signed)
06/27/2011  10:42 AM  PATIENT:  [name]  71 y.o. female  PRE-OPERATIVE DIAGNOSIS:  history hartmanns procedure / undesired end colostomy   POST-OPERATIVE DIAGNOSIS:  same   PROCEDURE:  Procedure(s) (LRB): COLOSTOMY REVERSAL (N/A) PROCTOSCOPY (N/A)  SURGEON:  Surgeon(s) and Role:    * Gayland Curry, MD - Primary    PHYSICIAN ASSISTANT: none  ASSISTANTS: Dr Ilean Skill   ANESTHESIA:   general  EBL:  Total I/O In: 1000 [I.V.:500; IV Piggyback:500] Out: 200 [Urine:100; Blood:100]  BLOOD ADMINISTERED:none  DRAINS: Urinary Catheter (Foley)   LOCAL MEDICATIONS USED:  NONE  SPECIMEN:  No Specimen  DISPOSITION OF SPECIMEN:  N/A  COUNTS:  YES  TOURNIQUET:  * No tourniquets in log *   DICTATION: .Other Dictation: Dictation Number [id number]  PLAN OF CARE: Admit to inpatient   PATIENT DISPOSITION:  PACU - hemodynamically stable.   Delay start of Pharmacological VTE agent (>24hrs) due to surgical blood loss or risk of bleeding: no  [name]. [name], MD, FACS General, Bariatric, & Minimally Invasive Surgery [facility], [place]

## 2011-06-27 NOTE — Progress Notes (Signed)
Report given to kay rn as caregiver

## 2011-06-27 NOTE — Anesthesia Procedure Notes (Signed)
Procedure Name: Intubation Date/Time: 06/27/2011 7:37 AM Performed by: Luciana Axe Pre-anesthesia Checklist: Patient identified, Emergency Drugs available, Suction available and Patient being monitored Patient Re-evaluated:Patient Re-evaluated prior to inductionOxygen Delivery Method: Circle System Utilized Preoxygenation: Pre-oxygenation with 100% oxygen Intubation Type: IV induction Ventilation: Mask ventilation without difficulty Laryngoscope Size: Miller and 2 Grade View: Grade I Tube type: Oral Tube size: 7.0 mm Number of attempts: 1 Placement Confirmation: ETT inserted through vocal cords under direct vision,  positive ETCO2 and breath sounds checked- equal and bilateral Secured at: 21 cm Tube secured with: Tape Dental Injury: Teeth and Oropharynx as per pre-operative assessment

## 2011-06-27 NOTE — Anesthesia Preprocedure Evaluation (Addendum)
Anesthesia Evaluation  Patient identified by MRN, date of birth, ID band Patient awake    Reviewed: Allergy & Precautions, H&P , NPO status , Patient's Chart, lab work & pertinent test results, reviewed documented beta blocker date and time   History of Anesthesia Complications (+) PONV  Airway Mallampati: II TM Distance: >3 FB Neck ROM: Full    Dental  (+) Dental Advisory Given and Edentulous Upper   Pulmonary    Pulmonary exam normal       Cardiovascular hypertension, Pt. on medications and Pt. on home beta blockers + CAD, + Past MI and + CABG + dysrhythmias Atrial Fibrillation Irregular Normal CABG x 3 2012   Neuro/Psych    GI/Hepatic GERD-  Controlled,  Endo/Other    Renal/GU      Musculoskeletal   Abdominal   Peds  Hematology   Anesthesia Other Findings   Reproductive/Obstetrics                          Anesthesia Physical Anesthesia Plan  ASA: III  Anesthesia Plan: General   Post-op Pain Management:    Induction: Intravenous  Airway Management Planned: Oral ETT  Additional Equipment:   Intra-op Plan:   Post-operative Plan: Extubation in OR  Informed Consent: I have reviewed the patients History and Physical, chart, labs and discussed the procedure including the risks, benefits and alternatives for the proposed anesthesia with the patient or authorized representative who has indicated his/her understanding and acceptance.     Plan Discussed with: CRNA and Surgeon  Anesthesia Plan Comments:         Anesthesia Quick Evaluation

## 2011-06-27 NOTE — Transfer of Care (Signed)
Immediate Anesthesia Transfer of Care Note  Patient: [name]  Procedure(s) Performed: Procedure(s) (LRB): COLOSTOMY REVERSAL (N/A) PROCTOSCOPY (N/A)  Patient Location: PACU  Anesthesia Type: General  Level of Consciousness: sedated  Airway & Oxygen Therapy: Patient Spontanous Breathing and Patient connected to nasal cannula oxygen  Post-op Assessment: Report given to PACU RN and Post -op Vital signs reviewed and stable  Post vital signs: stable  Complications: No apparent anesthesia complications

## 2011-06-28 ENCOUNTER — Encounter (HOSPITAL_COMMUNITY): Payer: Self-pay | Admitting: General Surgery

## 2011-06-28 LAB — CBC
HCT: 35.6 % — ABNORMAL LOW (ref 36.0–46.0)
Hemoglobin: 11.2 g/dL — ABNORMAL LOW (ref 12.0–15.0)
MCH: 27.5 pg (ref 26.0–34.0)
MCHC: 31.5 g/dL (ref 30.0–36.0)
MCV: 87.3 fL (ref 78.0–100.0)

## 2011-06-28 LAB — BASIC METABOLIC PANEL
BUN: 13 mg/dL (ref 6–23)
CO2: 24 mEq/L (ref 19–32)
Chloride: 106 mEq/L (ref 96–112)
Glucose, Bld: 133 mg/dL — ABNORMAL HIGH (ref 70–99)
Potassium: 4.1 mEq/L (ref 3.5–5.1)

## 2011-06-28 NOTE — Progress Notes (Signed)
Went to give patient her 6:00 a.m. Dose of Ofirmev to discover that the 12:00 a.m. Dose did not run in, despite the fact that I watched it for 5 minutes before leaving the room.  Amended 12:00 a.m. In [facility] to reflect not given.  6:00 dose is hanging up ready to give at next time interval.  Darnelle Bos, RN

## 2011-06-28 NOTE — Progress Notes (Signed)
UR of chart complete.

## 2011-06-28 NOTE — Progress Notes (Signed)
1 Day Post-Op  Subjective: Pain ok. Tylenol "helps". No belching or flatus. Some discomfort in lower abdomen. Anxious to get out of bed and start moving  Objective: Vital signs in last 24 hours: Temp:  [97.8 F (36.6 C)-100.1 F (37.8 C)] 100.1 F (37.8 C) (02/13 0204) Pulse Rate:  [62-85] 67  (02/13 0204) Resp:  [12-51] 16  (02/13 0737) BP: (118-146)/(43-72) 118/43 mmHg (02/13 0204) SpO2:  [92 %-100 %] 98 % (02/13 0737) Weight:  [139 lb 8.8 oz (63.3 kg)] 139 lb 8.8 oz (63.3 kg) (02/12 2000) Last BM Date: 06/26/11  Intake/Output from previous day: 02/12 0701 - 02/13 0700 In: 3426 [P.O.:240; I.V.:2586; IV Piggyback:600] Out: 925 [Urine:825; Blood:100] Intake/Output this shift:    Alert, nad, pleasant cta Reg Soft, min distension. HypoBS, some serosang drainage on gauze +scd. No edema  Lab Results:   [facility] 06/28/11 0625  WBC 7.8  HGB 11.2*  HCT 35.6*  PLT 139*   BMET  Basename 06/28/11 0625  NA 138  K 4.1  CL 106  CO2 24  GLUCOSE 133*  BUN 13  CREATININE 0.85  CALCIUM 8.7   PT/INR No results found for this basename: LABPROT:2,INR:2 in the last 72 hours ABG No results found for this basename: PHART:2,PCO2:2,PO2:2,HCO3:2 in the last 72 hours  Studies/Results: No results found.  Anti-infectives: Anti-infectives     Start     Dose/Rate Route Frequency Ordered Stop   06/26/11 1445   metroNIDAZOLE (FLAGYL) IVPB 500 mg        500 mg 100 mL/hr over 60 Minutes Intravenous 60 min pre-op 06/26/11 1444 06/27/11 0724          Assessment/Plan: s/p Procedure(s) (LRB): COLOSTOMY REVERSAL (N/A) PROCTOSCOPY (N/A)  Ice chips, sips of water Ambulate, OOB Cont subcu heparin Cont entereg  [name]. [name], MD, FACS General, Bariatric, & Minimally Invasive Surgery [facility], [place]    LOS: 1 day    Gayland Curry 06/28/2011

## 2011-06-28 NOTE — Progress Notes (Signed)
Small amount of blood from rectum on pad. Reassured patient and called md,

## 2011-06-29 LAB — BASIC METABOLIC PANEL
BUN: 7 mg/dL (ref 6–23)
CO2: 26 mEq/L (ref 19–32)
Calcium: 9 mg/dL (ref 8.4–10.5)
Glucose, Bld: 119 mg/dL — ABNORMAL HIGH (ref 70–99)
Sodium: 137 mEq/L (ref 135–145)

## 2011-06-29 LAB — CBC
MCH: 27 pg (ref 26.0–34.0)
MCV: 88.3 fL (ref 78.0–100.0)
Platelets: 125 10*3/uL — ABNORMAL LOW (ref 150–400)
RBC: 3.85 MIL/uL — ABNORMAL LOW (ref 3.87–5.11)

## 2011-06-29 MED ORDER — MORPHINE SULFATE (PF) 1 MG/ML IV SOLN
INTRAVENOUS | Status: AC
  Administered 2011-06-29: 2 mg via INTRAVENOUS
  Filled 2011-06-29: qty 25

## 2011-06-29 NOTE — Progress Notes (Signed)
2 Days Post-Op  Subjective: Pain controlled. Ambulated in halls and sat in chair. No flatus. Some belching.  Objective: Vital signs in last 24 hours: Temp:  [97.4 F (36.3 C)-100 F (37.8 C)] 99.2 F (37.3 C) (02/14 0959) Pulse Rate:  [62-98] 98  (02/14 0959) Resp:  [16-20] 16  (02/14 1129) BP: (126-137)/(51-73) 134/73 mmHg (02/14 0959) SpO2:  [95 %-100 %] 96 % (02/14 1129) Last BM Date: 06/27/11  Intake/Output from previous day: 02/13 0701 - 02/14 0700 In: 3476.3 [I.V.:3276.3; IV Piggyback:200] Out: 2000 [Urine:2000] Intake/Output this shift: Total I/O In: -  Out: 200 [Urine:200]  Nad, sitting in chair cta Reg Soft, some distension, +bs. Incision c/d/i with wicks. Old stoma site looks ok- no cellulitis.   Lab Results:   [facility] 06/29/11 0637 06/28/11 0625  WBC 5.6 7.8  HGB 10.4* 11.2*  HCT 34.0* 35.6*  PLT 125* 139*   BMET  Basename 06/29/11 0637 06/28/11 0625  NA 137 138  K 4.0 4.1  CL 106 106  CO2 26 24  GLUCOSE 119* 133*  BUN 7 13  CREATININE 0.84 0.85  CALCIUM 9.0 8.7   PT/INR No results found for this basename: LABPROT:2,INR:2 in the last 72 hours ABG No results found for this basename: PHART:2,PCO2:2,PO2:2,HCO3:2 in the last 72 hours  Studies/Results: No results found.  Anti-infectives: Anti-infectives     Start     Dose/Rate Route Frequency Ordered Stop   06/26/11 1445   metroNIDAZOLE (FLAGYL) IVPB 500 mg        500 mg 100 mL/hr over 60 Minutes Intravenous 60 min pre-op 06/26/11 1444 06/27/11 0724          Assessment/Plan: s/p Procedure(s) (LRB): COLOSTOMY REVERSAL (N/A) PROCTOSCOPY (N/A)  Clear liquids Ambulate Decrease IVF Cont subcu heparin.  [name]. [name], MD, FACS General, Bariatric, & Minimally Invasive Surgery [facility], [place]    LOS: 2 days    Gayland Curry 06/29/2011

## 2011-06-29 NOTE — Plan of Care (Signed)
Problem: Phase II Progression Outcomes Goal: Pain controlled Outcome: Progressing

## 2011-06-30 LAB — CBC
MCH: 27.7 pg (ref 26.0–34.0)
MCV: 88.2 fL (ref 78.0–100.0)
Platelets: 135 10*3/uL — ABNORMAL LOW (ref 150–400)
RDW: 15.3 % (ref 11.5–15.5)
WBC: 4.2 10*3/uL (ref 4.0–10.5)

## 2011-06-30 LAB — BASIC METABOLIC PANEL
CO2: 30 mEq/L (ref 19–32)
Calcium: 9 mg/dL (ref 8.4–10.5)
Creatinine, Ser: 0.81 mg/dL (ref 0.50–1.10)
Glucose, Bld: 116 mg/dL — ABNORMAL HIGH (ref 70–99)

## 2011-06-30 MED ORDER — OXYCODONE-ACETAMINOPHEN 5-325 MG PO TABS
1.0000 | ORAL_TABLET | ORAL | Status: DC | PRN
  Administered 2011-07-01: 1 via ORAL
  Administered 2011-07-01: 2 via ORAL
  Administered 2011-07-01 – 2011-07-02 (×2): 1 via ORAL
  Filled 2011-06-30: qty 1
  Filled 2011-06-30: qty 2
  Filled 2011-06-30 (×2): qty 1

## 2011-06-30 MED ORDER — PANTOPRAZOLE SODIUM 40 MG PO TBEC
40.0000 mg | DELAYED_RELEASE_TABLET | Freq: Every day | ORAL | Status: DC
  Administered 2011-06-30 – 2011-07-01 (×2): 40 mg via ORAL
  Filled 2011-06-30 (×2): qty 1

## 2011-06-30 MED ORDER — MORPHINE SULFATE 2 MG/ML IJ SOLN: 2.0000 mg | INTRAMUSCULAR | Status: DC | PRN

## 2011-06-30 NOTE — Progress Notes (Signed)
3 Days Post-Op  Subjective: Pain well controlled. Ambulated several times. Tolerated clears. No flatus. Some belching. 'feel's rumbling'. No n/v  Objective: Vital signs in last 24 hours: Temp:  [97.9 F (36.6 C)-99.2 F (37.3 C)] 98.4 F (36.9 C) (02/15 0515) Pulse Rate:  [66-98] 76  (02/15 0515) Resp:  [16-18] 18  (02/15 0515) BP: (112-146)/(48-73) 116/53 mmHg (02/15 0515) SpO2:  [95 %-100 %] 100 % (02/15 0515) Last BM Date: 06/27/11  Intake/Output from previous day: 02/14 0701 - 02/15 0700 In: 1518.3 [P.O.:180; I.V.:1338.3] Out: 1950 [Urine:1950] Intake/Output this shift:    Alert, nad, smiling cta Reg Soft, mild distension. +scattered bs,. Incision c/d/i with wicks. Old ostomy site - no cellulitis. Packing changed. No edema  Lab Results:   Basename 06/30/11 0500 06/29/11 0637  WBC 4.2 5.6  HGB 10.6* 10.4*  HCT 33.7* 34.0*  PLT 135* 125*   BMET  Basename 06/30/11 0500 06/29/11 0637  NA 139 137  K 3.9 4.0  CL 105 106  CO2 30 26  GLUCOSE 116* 119*  BUN 5* 7  CREATININE 0.81 0.84  CALCIUM 9.0 9.0   PT/INR No results found for this basename: LABPROT:2,INR:2 in the last 72 hours ABG No results found for this basename: PHART:2,PCO2:2,PO2:2,HCO3:2 in the last 72 hours  Studies/Results: No results found.  Anti-infectives: Anti-infectives     Start     Dose/Rate Route Frequency Ordered Stop   06/26/11 1445   metroNIDAZOLE (FLAGYL) IVPB 500 mg        500 mg 100 mL/hr over 60 Minutes Intravenous 60 min pre-op 06/26/11 1444 06/27/11 0724          Assessment/Plan: s/p Procedure(s) (LRB): COLOSTOMY REVERSAL (N/A) PROCTOSCOPY (N/A)  Cont IS, pulm toilet, ambulation Cont subcu heparin for DVT proph Stay on clears til has flatus Cont Entereg w-d dressing bid to old ostomy site  [name]. [name], MD, FACS General, Bariatric, & Minimally Invasive Surgery [facility], [place]     LOS: 3 days    [name] 06/30/2011

## 2011-06-30 NOTE — Discharge Instructions (Signed)
[facility], [place] [phone]  OPEN ABDOMINAL SURGERY: POST OP INSTRUCTIONS  Always review your discharge instruction sheet given to you by the facility where your surgery was performed.  IF YOU HAVE DISABILITY OR FAMILY LEAVE FORMS, YOU MUST BRING THEM TO THE OFFICE FOR PROCESSING.  PLEASE DO NOT GIVE THEM TO YOUR DOCTOR.  1. A prescription for pain medication may be given to you upon discharge.  Take your pain medication as prescribed, if needed.  If narcotic pain medicine is not needed, then you may take acetaminophen (Tylenol) or ibuprofen (Advil) as needed. 2. Take your usually prescribed medications unless otherwise directed. 3. If you need a refill on your pain medication, please contact your pharmacy. They will contact our office to request authorization.  Prescriptions will not be filled after 5pm or on week-ends. 4. You should follow a light diet the first few days after arrival home, such as soup and crackers, pudding, etc.unless your doctor has advised otherwise. A high-fiber, low fat diet can be resumed as tolerated.   Be sure to include lots of fluids daily.  5. Most patients will experience some swelling and bruising in the area of the incision. Ice pack will help. Swelling and bruising can take several days to resolve..  6. It is common to experience some constipation if taking pain medication after surgery.  Increasing fluid intake and taking a stool softener will usually help or prevent this problem from occurring.  A mild laxative (Milk of Magnesia or Miralax) should be taken according to package directions if there are no bowel movements after 48 hours. 7.   Any sutures or staples will be removed at the office during your follow-up visit. You may find that a light gauze bandage over your incision may keep your staples from being rubbed or pulled. You may shower and replace the bandage daily. 8. ACTIVITIES:  You may resume regular (light) daily activities  beginning the next day--such as daily self-care, walking, climbing stairs--gradually increasing activities as tolerated.  You may have sexual intercourse when it is comfortable.  Refrain from any heavy lifting or straining until approved by your doctor. a. You may drive when you no longer are taking prescription pain medication, you can comfortably wear a seatbelt, and you can safely maneuver your car and apply brakes b. Return to Work:  9. You should see your doctor in the office for a follow-up appointment approximately two weeks after your surgery.  Make sure that you call for this appointment within a day or two after you arrive home to insure a convenient appointment time. OTHER INSTRUCTIONS:  PERFORM WET-DRY DRESSING TWICE A DAY TO OLD OSTOMY SITE AS INSTRUCTED. PLACE A MOIST SMALL GAUZE INTO WOUND. TRY TO AVOID HAVING WET GAUZE ON YOUR SKIN. COVER WITH DRY GAUZE AND SOME TAPE.   THE WOUND CAN GET WET IN THE SHOWER  WHEN TO CALL YOUR DOCTOR: 1. Fever over 101.0 2. Inability to urinate 3. Nausea and/or vomiting 4. Extreme swelling or bruising 5. Continued bleeding from incision. 6. Increased pain, redness, or drainage from the incision. 7. Difficulty swallowing or breathing 8. Muscle cramping or spasms..  The clinic staff is available to answer your questions during regular business hours.  Please don't hesitate to call and ask to speak to one of the nurses if you have concerns.  For further questions, please visit [email or web address]

## 2011-07-01 NOTE — Progress Notes (Signed)
Patient ID: [name], female   DOB: 1940/11/02, 71 y.o.   MRN: [id number] 4 Days Post-Op  Subjective: Doing well. Tolerating full liquids, passing flatus and having BM's. Ambulating well  Objective: Vital signs in last 24 hours: Temp:  [98.2 F (36.8 C)-98.7 F (37.1 C)] 98.2 F (36.8 C) (02/16 0520) Pulse Rate:  [65-75] 73  (02/16 0520) Resp:  [16-18] 16  (02/16 0520) BP: (120-141)/(53-62) 120/60 mmHg (02/16 0520) SpO2:  [100 %] 100 % (02/16 0520) Last BM Date: 07/01/11  Intake/Output from previous day: 02/15 0701 - 02/16 0700 In: 3481.8 [I.V.:3481.8] Out: 1876 [Urine:1875; Stool:1] Intake/Output this shift:    Alert, nad, smiling cta Reg Soft, mild distension. +scattered bs,. Incision c/d/i with wicks. Old ostomy site - very mild erythema, but no drainage, no induration  Lab Results:   Basename 06/30/11 0500 06/29/11 0637  WBC 4.2 5.6  HGB 10.6* 10.4*  HCT 33.7* 34.0*  PLT 135* 125*   BMET  Basename 06/30/11 0500 06/29/11 0637  NA 139 137  K 3.9 4.0  CL 105 106  CO2 30 26  GLUCOSE 116* 119*  BUN 5* 7  CREATININE 0.81 0.84  CALCIUM 9.0 9.0   PT/INR No results found for this basename: LABPROT:2,INR:2 in the last 72 hours ABG No results found for this basename: PHART:2,PCO2:2,PO2:2,HCO3:2 in the last 72 hours  Studies/Results: No results found.  Anti-infectives: Anti-infectives     Start     Dose/Rate Route Frequency Ordered Stop   06/26/11 1445   metroNIDAZOLE (FLAGYL) IVPB 500 mg        500 mg 100 mL/hr over 60 Minutes Intravenous 60 min pre-op 06/26/11 1444 06/27/11 0724          Assessment/Plan: s/p Procedure(s) (LRB): COLOSTOMY REVERSAL (N/A) PROCTOSCOPY (N/A)  Cont IS, pulm toilet, ambulation Cont subcu heparin for DVT proph Advance to Regular diet Cont Entereg w-d dressing bid to old ostomy site   Ulysees Barns Pager [place] Pager [phone]      LOS: 4 days    Erwin Nishiyama 07/01/2011

## 2011-07-01 NOTE — Progress Notes (Signed)
Advance diet Ok to leave out IV.

## 2011-07-02 MED ORDER — OXYCODONE-ACETAMINOPHEN 5-325 MG PO TABS: 1.0000 | ORAL_TABLET | ORAL | Status: AC | PRN

## 2011-07-02 NOTE — Progress Notes (Signed)
Patient ID: [name], female   DOB: 1941-01-11, 71 y.o.   MRN: [id number] 5 Days Post-Op  Subjective: Tolerated reg diet. Ambulating well  Objective: Vital signs in last 24 hours: Temp:  [97.7 F (36.5 C)-99 F (37.2 C)] 99 F (37.2 C) (02/17 0523) Pulse Rate:  [63-69] 68  (02/17 0523) Resp:  [17-18] 18  (02/17 0523) BP: (109-127)/(64-68) 109/66 mmHg (02/17 0523) SpO2:  [96 %-100 %] 96 % (02/17 0523) Last BM Date: 07/01/11  Intake/Output from previous day: 02/16 0701 - 02/17 0700 In: 120 [P.O.:120] Out: -  Intake/Output this shift:    Alert, nad, smiling cta Reg Soft, non distended, approp tender at incision.  No sign of infection.  Wicks pulled.     Lab Results:   Basename 06/30/11 0500  WBC 4.2  HGB 10.6*  HCT 33.7*  PLT 135*   BMET  Basename 06/30/11 0500  NA 139  K 3.9  CL 105  CO2 30  GLUCOSE 116*  BUN 5*  CREATININE 0.81  CALCIUM 9.0   PT/INR No results found for this basename: LABPROT:2,INR:2 in the last 72 hours ABG No results found for this basename: PHART:2,PCO2:2,PO2:2,HCO3:2 in the last 72 hours  Studies/Results: No results found.  Anti-infectives: Anti-infectives     Start     Dose/Rate Route Frequency Ordered Stop   06/26/11 1445   metroNIDAZOLE (FLAGYL) IVPB 500 mg        500 mg 100 mL/hr over 60 Minutes Intravenous 60 min pre-op 06/26/11 1444 06/27/11 0724          Assessment/Plan: s/p Procedure(s) (LRB): COLOSTOMY REVERSAL (N/A) PROCTOSCOPY (N/A)  D/C home Follow up with Dr. [name] for staple removal.      LOS: 5 days    [facility] 07/02/2011

## 2011-07-02 NOTE — Discharge Summary (Signed)
Physician Discharge Summary  Patient ID: [name] MRN: [id number] DOB/AGE: December 05, 1940 71 y.o.  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 She was given Entereg perioperatively for bowel recovery. She was started on subcutaneous heparin for DVT prophylaxis.  She was placed on the general surgery ward postoperatively. She was given sips of liquids on pod 1. Her foley catheter was removed on POD 2. She was advanced to clears on POD 2. On POD 3, she had a bowel movement and was advanced to full liquids and converted from PCA morphine to oral pain meds. Her old ostomy site was left partially open and the pt was instructed on wound care. On POD 5, she was tolerating a regular diet, ambulating without difficulty, had stable vital signs and her incisions had no sign of infection.   Consults: None  Significant Diagnostic Studies: labs: Hgb 10.6/ HCT 33.7 on 2/15  Treatments: IV hydration, analgesia: acetaminophen, Morphine and percocet and surgery: COLOSTOMY CLOSURE, RIGID PROCTOSCOPY 06/27/11  Discharge Exam: Blood pressure 109/66, pulse 68, temperature 99 F (37.2 C), temperature source Oral, resp. rate 18, height 5' 6"  (1.676 m), weight 139 lb 8.8 oz (63.3 kg), SpO2 96.00%. Alert, NAD, smiling CTA  RRR Soft, nontender, non-distended. +BS. Lower incision - c/d/i. Old ostomy site - partially closed, no cellulitis No edema  Disposition: Home or Self  Care  Discharge Orders    Future Appointments: Provider: Department: Dept Phone: Center:   08/07/2011 10:30 AM [place] [phone] [facility]   08/07/2011 11:00 AM Loralie Champagne, [place] [phone] [facility]     Future Orders Please Complete By Expires   Diet - low sodium heart healthy      Increase activity slowly      Discharge wound care:      Comments:   Wet to dry daily at prior left sided colostomy site.  Dry dressing to midline as needed.     Medication List  As of 07/02/2011  7:50 PM   TAKE these medications         aspirin 81 MG chewable tablet   Chew 81 mg by mouth daily.      Calcium Carbonate-Vitamin D 600-400 MG-UNIT per tablet   Take 1 tablet by mouth daily.      Coenzyme Q10 200 MG Tabs   Take 1 tablet by mouth daily.      metoprolol succinate 25 MG 24 hr tablet   Commonly known as: TOPROL-XL   Take 25 mg by mouth daily.      multivitamin tablet   Take 1 tablet by mouth daily.      oxyCODONE-acetaminophen 5-325 MG per tablet   Commonly known as: PERCOCET   Take 1-2 tablets by mouth every 4 (four) hours as needed.      pravastatin 40 MG tablet   Commonly known as: PRAVACHOL   Take 1 tablet (40 mg total) by mouth every evening.      PROBIOTIC-PREBIOTIC 1-250 BILLION-MG Caps   Take 1 capsule by mouth daily.  senna 8.6 MG tablet   Commonly known as: SENOKOT   Take 1 tablet by mouth 2 (two) times daily.      vitamin C 500 MG tablet   Commonly known as: ASCORBIC ACID   Take 500 mg by mouth daily.           Follow-up Information    Follow up with Gayland Curry, MD. Schedule an appointment as soon as possible for a visit in 7 days.   Contact information:   [employer], [address], [address] [place] [name], MD, FACS General, Bariatric, & Minimally Invasive Surgery [facility],  [place]  Signed: Gayland Curry 07/02/2011, 7:50 PM

## 2011-07-03 ENCOUNTER — Telehealth (INDEPENDENT_AMBULATORY_CARE_PROVIDER_SITE_OTHER): Payer: Self-pay | Admitting: General Surgery

## 2011-07-03 NOTE — Telephone Encounter (Signed)
Message copied by Margarette Asal on Mon Jul 03, 2011  1:15 PM ------      Message from: [name], ERIC M      Created: Sun Jul 02, 2011  8:03 PM       [facility] you had a good weekend.            i need to see this pt in 3 weeks      But i need you to see early on the week on 2/25 or 2/26 for staple removal.              pls call her with both appts.            Thanks      [place]

## 2011-07-03 NOTE — Telephone Encounter (Signed)
Spoke with patient and made aware of nurse appt and follow up with Dr [name].

## 2011-07-07 ENCOUNTER — Telehealth (INDEPENDENT_AMBULATORY_CARE_PROVIDER_SITE_OTHER): Payer: Self-pay

## 2011-07-07 NOTE — Telephone Encounter (Signed)
11:30am -Patient called with wound care update.  Some yellow drainage on gauze with dressing change, packing removed some red drainage. Incision looks well, no redness or signs of infection. She was advised to call if anything should change over the weekend. Patient still has staples in place and has a follow up appointment on  Monday 07/10/2011 with Dr. [name].  Dr. [name] in office today verbal given to him. OK normal occurrence.

## 2011-07-10 ENCOUNTER — Ambulatory Visit (INDEPENDENT_AMBULATORY_CARE_PROVIDER_SITE_OTHER): Payer: Medicare Other | Admitting: General Surgery

## 2011-07-10 ENCOUNTER — Telehealth: Payer: Self-pay | Admitting: Cardiology

## 2011-07-10 DIAGNOSIS — Z4802 Encounter for removal of sutures: Secondary | ICD-10-CM

## 2011-07-10 NOTE — Patient Instructions (Signed)
Call with any change to the wound. Redness or increased drainage. Keep follow up with Dr [name].

## 2011-07-10 NOTE — Telephone Encounter (Signed)
Patient called, stated she brought a diary of B/P readings to office this morning.Patient was told Dr.Mclean's nurse not in office today.Patient stated lowest B/P reading 82/40,today 120/46,wanted to know if ok to take pravastatin tonight.Advised ok to take pravastatin.Will forward to Dr.Mclean's nurse.

## 2011-07-10 NOTE — Progress Notes (Signed)
Patient comes in status post colostomy reversal with a midline incision and a smaller incision to the left. Both incisions look good. Smaller incision is receiving wet to dry dressing changes and is healing well. Staples were removed from both incisions and benzoin and steri-strips placed. Smaller wound was repacked. Patient instructed to call with any wound problems and keep follow up appt with Dr [name].

## 2011-07-10 NOTE — Telephone Encounter (Signed)
New Msg: Pt calling wanting to speak with nurse regarding pt BP readings. Pt wants to know if nurse/MD want pt to continue with BP/cholesterol medication.   Please return pt call to discuss further.

## 2011-07-11 NOTE — Telephone Encounter (Signed)
Pt explained she had had dizziness for 4 mornings in a row. Pt was concerned that it was either pravachol or metoprolol. bp 116/56 which is better than prior/ pulse 58. Pt feeling better now, told her to continue meds as directed. Explained that she may have needed time to adjust to new increase and new med. Pt to continue to monitor bp 1.5 hrs after med taken and to take bp if feeling dizzy. Told her to stay hydrated. Pt to call back with questions or concerns. She agreed to plan.

## 2011-07-11 NOTE — Telephone Encounter (Signed)
F/U   Patient calling for update on status of B/P reading concerns, please return call to patient at hm# [phone].

## 2011-07-13 ENCOUNTER — Telehealth: Payer: Self-pay | Admitting: *Deleted

## 2011-07-13 NOTE — Telephone Encounter (Signed)
Pt brought in a list of BP readings and symptoms since 07/04/11. Dr Aundra Dubin reviewed the list. He recommended pt cut Toprol XL in half. I discussed with pt and she agreed. She will continue to take and record her BP and heart rate. She will bring the readings to her appt with Dr Aundra Dubin 08/07/11.

## 2011-07-18 ENCOUNTER — Telehealth (INDEPENDENT_AMBULATORY_CARE_PROVIDER_SITE_OTHER): Payer: Self-pay | Admitting: General Surgery

## 2011-07-18 MED ORDER — FLUCONAZOLE 100 MG PO TABS: 100.0000 mg | ORAL_TABLET | Freq: Every day | ORAL | Status: AC

## 2011-07-18 NOTE — Telephone Encounter (Signed)
Patient believes she is developing a yeast infection. Per Dr [name] okay to call in [place] for patient. Called to [employer]. Patient aware.

## 2011-07-20 ENCOUNTER — Other Ambulatory Visit (INDEPENDENT_AMBULATORY_CARE_PROVIDER_SITE_OTHER): Payer: Medicare Other

## 2011-07-20 ENCOUNTER — Encounter (INDEPENDENT_AMBULATORY_CARE_PROVIDER_SITE_OTHER): Payer: Self-pay | Admitting: General Surgery

## 2011-07-20 ENCOUNTER — Ambulatory Visit (INDEPENDENT_AMBULATORY_CARE_PROVIDER_SITE_OTHER): Payer: Medicare Other | Admitting: General Surgery

## 2011-07-20 ENCOUNTER — Telehealth: Payer: Self-pay | Admitting: Cardiology

## 2011-07-20 VITALS — BP 122/70 | HR 64 | Resp 16 | Ht 66.5 in | Wt 136.0 lb

## 2011-07-20 DIAGNOSIS — R002 Palpitations: Secondary | ICD-10-CM

## 2011-07-20 DIAGNOSIS — Z09 Encounter for follow-up examination after completed treatment for conditions other than malignant neoplasm: Secondary | ICD-10-CM

## 2011-07-20 DIAGNOSIS — E785 Hyperlipidemia, unspecified: Secondary | ICD-10-CM

## 2011-07-20 NOTE — Telephone Encounter (Signed)
Please return call to patient at hm# [phone]  Patient reports having heart flutters over the last couple of days.  Patient has colostomy reversal surgery on 06/27/11.  No SOB or Dizziness at this point.  Please return call to patient to advise.

## 2011-07-20 NOTE — Telephone Encounter (Signed)
Talked with pt. Pt states she had colostomy reversal 06/27/11. Pt had done fine since surgery until 3-4 days ago. She complains of fluttering in her chest that is short lasting(minutes) and may occur 1-2 times a day. She denies lightheadedness or dizziness. She thinks it's similar to at fib she had in the past. Reviewed with Dr Aundra Dubin. He recommended pt have a BMET and 3 week event monitor. I discussed with pt and she agreed with this plan. She will come today for BMET.

## 2011-07-20 NOTE — Progress Notes (Signed)
Chief complaint: Postop  Procedure: Status post colostomy reversal June 27, 2011  History of Present Ilness: 71 year old Caucasian female comes in today for her first initial postop appointment. She saw my nurse about a week and a half ago for staple removal. She states that overall she has been doing well. However she noticed that her heart started fluttering late yesterday early today. She denies palpitations; but, at times it is irregular. She denies any chest pain or chest pressure. She denies any shortness of breath. She denies any fevers or chills. She denies any nausea or vomiting. She is still taking senna. She has had some loose stool. She still has some intermittent lower abdominal wall pain. It is intermittent. It is mild. She denies any melena or hematochezia. She had a yeast infection. For which we called a prescription for Diflucan which took care of the problem. She had blood work this morning for her heart fluttering and her physician has ordered a heart monitor.  Physical Exam: BP 122/70  Pulse 64  Resp 16  Ht 5' 6.5" (1.689 m)  Wt 136 lb (61.689 kg)  BMI 21.62 kg/m2  Gen: alert, NAD, non-toxic appearing Pupils: equal, no scleral icterus Pulm: Lungs clear to auscultation, symmetric chest rise CV: regular rate and rhythm Abd: soft, nontender, nondistended. Well-healed midline incision. No cellulitis. No incisional hernia. Old stoma site - has about 1.5 cm opening, depth about 68m. Good granulation tissue.  Ext: no edema, no calf tenderness Skin: no rash, no jaundice  Assessment and Plan: Status post colostomy reversal. Her vital signs are stable. Overall she is doing well. I encouraged her to continue with her daily walking. I've asked her to stop taking senna. Followup 6 weeks.  ELeighton Ruff WRedmond Pulling MD, FACS General, Bariatric, & Minimally Invasive Surgery [facility] [place]

## 2011-07-20 NOTE — Patient Instructions (Signed)
Stop taking the senna Try to eat a high fiber diet

## 2011-07-21 LAB — LIPID PANEL
Cholesterol: 146 mg/dL (ref 0–200)
HDL: 52.1 mg/dL (ref >39.00)
Total CHOL/HDL Ratio: 3
VLDL: 42.6 mg/dL — ABNORMAL HIGH (ref 0.0–40.0)

## 2011-07-21 LAB — BASIC METABOLIC PANEL
CO2: 30 mEq/L (ref 19–32)
Chloride: 103 mEq/L (ref 96–112)
Potassium: 4.1 mEq/L (ref 3.5–5.1)
Sodium: 139 mEq/L (ref 135–145)

## 2011-07-21 LAB — LDL CHOLESTEROL, DIRECT: Direct LDL: 81.8 mg/dL

## 2011-07-21 LAB — HEPATIC FUNCTION PANEL
Albumin: 3.6 g/dL (ref 3.5–5.2)
Total Protein: 6.5 g/dL (ref 6.0–8.3)

## 2011-07-24 ENCOUNTER — Encounter (INDEPENDENT_AMBULATORY_CARE_PROVIDER_SITE_OTHER): Payer: Medicare Other

## 2011-07-24 DIAGNOSIS — R002 Palpitations: Secondary | ICD-10-CM

## 2011-07-28 ENCOUNTER — Telehealth (INDEPENDENT_AMBULATORY_CARE_PROVIDER_SITE_OTHER): Payer: Self-pay | Admitting: General Surgery

## 2011-07-28 NOTE — Telephone Encounter (Signed)
Patient is having trouble with her insurance company covering the cost of her wound vac. Asking [place] to write a letter stating that she was wearing a hospital supplied wound vac until 12/31/10 when she was discharged home then she was using that until 01/31/11. I made her aware I would write a letter stating this and fax it to [place] at [phone]. I will give her a call at [phone] to let her know this has been faxed.

## 2011-08-01 ENCOUNTER — Encounter: Payer: Self-pay | Admitting: Cardiology

## 2011-08-01 NOTE — Telephone Encounter (Signed)
error 

## 2011-08-07 ENCOUNTER — Encounter: Payer: Self-pay | Admitting: Cardiology

## 2011-08-07 ENCOUNTER — Ambulatory Visit (INDEPENDENT_AMBULATORY_CARE_PROVIDER_SITE_OTHER): Payer: Medicare Other | Admitting: Cardiology

## 2011-08-07 ENCOUNTER — Ambulatory Visit (INDEPENDENT_AMBULATORY_CARE_PROVIDER_SITE_OTHER): Payer: Medicare Other | Admitting: *Deleted

## 2011-08-07 VITALS — BP 138/78 | HR 60 | Ht 66.0 in | Wt 135.4 lb

## 2011-08-07 DIAGNOSIS — E785 Hyperlipidemia, unspecified: Secondary | ICD-10-CM

## 2011-08-07 DIAGNOSIS — I2510 Atherosclerotic heart disease of native coronary artery without angina pectoris: Secondary | ICD-10-CM

## 2011-08-07 DIAGNOSIS — I4891 Unspecified atrial fibrillation: Secondary | ICD-10-CM

## 2011-08-07 LAB — LIPID PANEL
Cholesterol: 161 mg/dL (ref 0–200)
HDL: 66 mg/dL (ref >39.00)
LDL Cholesterol: 74 mg/dL (ref 0–99)
Triglycerides: 104 mg/dL (ref 0.0–149.0)
VLDL: 20.8 mg/dL (ref 0.0–40.0)

## 2011-08-07 LAB — HEPATIC FUNCTION PANEL
ALT: 15 U/L (ref 0–35)
Albumin: 3.9 g/dL (ref 3.5–5.2)
Bilirubin, Direct: 0.1 mg/dL (ref 0.0–0.3)
Total Protein: 7.1 g/dL (ref 6.0–8.3)

## 2011-08-07 NOTE — Patient Instructions (Signed)
Your physician wants you to follow-up in: 6 months with Dr Aundra Dubin. (September 2013).You will receive a reminder letter in the mail two months in advance. If you don't receive a letter, please call our office to schedule the follow-up appointment.

## 2011-08-08 NOTE — Progress Notes (Signed)
PCP: Dr. Dena Billet  71 yo presented to [facility] in 7/12 with chest pain.  She ruled in for NSTEMI.  Cardiac catheterization 7/12: Proximal LAD 30%, mid LAD 60-70%, distal LAD 80%, ostial OM2 50%, mid OM2 40%, 80% at the trifurcation, proximal RCA 25% then occluded with left to right collaterals, EF 55%.  Echocardiogram 7/12: EF 60-65%, PASP 45.  She was referred for bypass surgery.  Grafts included a LIMA-LAD, SVG-D1, SVG-PDA.  Of note, she did have an endarterectomy of the OM.  She developed postoperative atrial fibrillation and was treated with Coumadin and amiodarone.  She maintained normal sinus rhythm with this.  Unfortunately, she developed C. Difficile colitis.  She improved and was eventually discharged home in 7/12.  She was readmitted 8/3-8/18 with failure to thrive, anasarca and profound diarrhea.  She was placed on IV vancomycin.  She was diuresed for anasarca.  Relook echocardiogram demonstrated normal LV function.  CT scan demonstrated a perforated sigmoid colon and she was referred for exploratory laparotomy.  She underwent sigmoid colectomy with end colostomy [name] procedure) by Dr. [name].  She had her colostomy reversed in 2/13.    Mrs Spader is doing well.  No chest pain.  No dyspnea.  She walks for 30 minutes/day.  She had been having some trouble with orthostatic lightheadedness, but this resolved with decrease in Toprol XL dose to 12.5 mg daily.  SBP runs 120s-130s at home. Weight is stable. Main recent complaint has been periodic palpitations that are bothersome.  She is wearing a 3-week event monitor currently.  So far, no significant arrhythmias have been detected.   Labs (9/12): K 4.7, creatinine 0.6, LFTs normal, HCT 33.2 Labs (3/13): LDL 82, HDL 52, K 4.1, creatinine 0.8   Past Medical History  Diagnosis Date  . Coronary artery disease     a. NSTEMI 7/12, cath: pLAD 30%, mLAD 60-70%, dLAD 80%, oOM2 50%, mOM2 40%, trifurcation 80%, pRCA 25%, then occluded,  L-R collats, EF 55%;   b. 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 Roxy Manns)  . Hyperlipidemia     takes Pravastatin daily  . Pericarditis 1980    hx of  . GERD (gastroesophageal reflux disease)   . Palpitations 2003    hx of; Holter in 2003 with PACs and PVCs  . Atrial fibrillation     post-op after CABG;  treated with Amiodarone and coumadin until readmxn with CDiff colitis, perf colon and elevated LFTs (amio and coumadin stopped)  . C. difficile colitis 01/7988    complicated post CABG course with prolonged admission, perforated sigmoid colon;  s/p sigmoid colectomy with end colostomy [name] procedure);  laparotomy wound infection with Pseudomonas treated with antibxs and secondary intention  . 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 Surgical wound infection     Pseudomonas; wound VAC; secondary intention  . 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 Hair loss     after heart surgery  . Hypertension     takes Metoprolol daily  . Mitral valve prolapse     was told this in the late 1990's; ;then with Dr.Malaina Mortellaro he states that she doesn't  . Arthritis     neck and back  . PONV (postoperative nausea and vomiting)     w/appy  . Complication of anesthesia 12/2010    "had trouble waking me up after colon OR"  . Myocardial infarction 11/2010  .  History of bronchitis     late 1980's  . Anemia     "in my early teens"  . H/O hiatal hernia     "slight; not having problems with it"  . Seizures     "w/verapimil"  . Hemorrhoids     "none since hemorrhoid OR"    Current Outpatient Prescriptions  Medication Sig Dispense Refill  . aspirin 81 MG chewable tablet Chew 81 mg by mouth daily.       . Bacillus Coagulans-Inulin (PROBIOTIC-PREBIOTIC) 1-250 BILLION-MG CAPS Take 1 capsule by mouth daily.       . Calcium Carbonate-Vitamin D 600-400 MG-UNIT per tablet Take 1 tablet by mouth daily.       . Coenzyme Q10 200 MG TABS Take 1 tablet by mouth daily.      0  . metoprolol succinate (TOPROL-XL) 25 MG 24 hr tablet Take 25 mg by mouth daily. Taking half tab a day      . metoprolol tartrate (LOPRESSOR) 25 MG tablet Take 1/2 daily      . Multiple Vitamin (MULTIVITAMIN) tablet Take 1 tablet by mouth daily.       . pravastatin (PRAVACHOL) 40 MG tablet Take 1 tablet (40 mg total) by mouth every evening.  30 tablet  11  . vitamin C (ASCORBIC ACID) 500 MG tablet Take 500 mg by mouth daily.         Allergies: Allergies  Allergen Reactions  . Sulfonamide Derivatives Anaphylaxis  . Nadolol Hives    Rash and nausea  . Other Other (See Comments)    Apples-anaphylaxis  . Verapamil Other (See Comments)    seizures   SH: Lives with husband in [place], retired Therapist, sports.  Nonsmoker.   Vital Signs: BP 138/78  Pulse 60  Ht 5' 6"  (1.676 m)  Wt 135 lb 6.4 oz (61.417 kg)  BMI 21.85 kg/m2  PHYSICAL EXAM: General: Well nourished, well developed, in no acute distress HEENT: normal Neck: no JVD At 90 Cardiac:  normal S1, S2; RRR; 1/6 SEM RUSB.  Lungs:  clear to auscultation bilaterally, no wheezing, rhonchi or rales JTT:SVXB, nontender, no hepatosplenomegaly Ext: no edema Skin: warm and dry Psych: Normal affect

## 2011-08-08 NOTE — Assessment & Plan Note (Signed)
Patient unable to tolerate Crestor due to leg weakness.  She has done well so far with pravastatin.  LDL was near goal (< 70) when recently checked.

## 2011-08-08 NOTE — Assessment & Plan Note (Signed)
Status post CABG.  EF preserved. She is on ASA 81, pravastatin, andToprol XL.

## 2011-08-08 NOTE — Assessment & Plan Note (Signed)
Patient had post-operative atrial fibrillation with no documented recurrence. She is in sinus rhythm today. She has had recent palpitations and is wearing a 3-week event monitor.  So far, no significant arrhythmia. She will complete the event monitor, plan anticoagulation if recurrent atrial fibrillation is noted.

## 2011-08-31 ENCOUNTER — Encounter: Payer: Self-pay | Admitting: Gastroenterology

## 2011-08-31 ENCOUNTER — Encounter (INDEPENDENT_AMBULATORY_CARE_PROVIDER_SITE_OTHER): Payer: Self-pay | Admitting: General Surgery

## 2011-08-31 ENCOUNTER — Ambulatory Visit (INDEPENDENT_AMBULATORY_CARE_PROVIDER_SITE_OTHER): Payer: Medicare Other | Admitting: General Surgery

## 2011-08-31 VITALS — BP 152/78 | HR 64 | Temp 98.2°F | Resp 16 | Ht 66.5 in | Wt 138.6 lb

## 2011-08-31 DIAGNOSIS — Z09 Encounter for follow-up examination after completed treatment for conditions other than malignant neoplasm: Secondary | ICD-10-CM

## 2011-08-31 DIAGNOSIS — R1032 Left lower quadrant pain: Secondary | ICD-10-CM

## 2011-08-31 NOTE — Progress Notes (Signed)
Chief complaint: Followup  Procedure: Colostomy reversal, rigid proctoscopy February 12  History of Present Ilness: 71 year old Caucasian female comes in for follow up regarding her colostomy reversal. I last saw her on March 7. At that time she was doing well from an abdominal standpoint. However, she relates over the past several weeks she has had intermittent left lower quadrant pain (above and below old stoma site). It is generally described as a dull ache. She will have some occasional bloating as well. The pain is relieved after having a bowel movement. It is described as a 5/10. This past weekend, she relates she had a severe episode of left lower quadrant pain. She states that it was about a 10 out of 10. It was relieved after having a bowel movement. She noticed that the stool was blood-tinged. There is no blood in the commode. The stool was mixed with blood. She denies any nausea, vomiting, fever, or chills. She reports a good appetite.  Physical Exam: BP 152/78  Pulse 64  Temp(Src) 98.2 F (36.8 C) (Temporal)  Resp 16  Ht 5' 6.5" (1.689 m)  Wt 138 lb 9.6 oz (62.869 kg)  BMI 22.04 kg/m2  Gen: alert, NAD, non-toxic appearing Pupils: equal, no scleral icterus Pulm: Lungs clear to auscultation, symmetric chest rise CV: regular rate and rhythm Abd: soft, nondistended. Well-healed midline incision and old LLQ ostomy incision. No incisional hernia; very mild subtle LLQ TTP. No RT/guarding.  Ext: no edema, no calf tenderness Skin: no rash, no jaundice  Assessment and Plan: 71 year old Caucasian female status post colostomy reversal and rigid proctoscopy for prior Hartman's procedure for perforated C. difficile colitis.  I have a low suspicion for intra-abdominal abscess. This sounds like her anastomosis may be a narrowed. I do not see the need for CT scan. However, I do believe she warrants a colonoscopy to evaluate her anastomosis. I do not believe she has recurrent colitis. She has  not had any diarrhea.  We will refer her to gastroenterology for colonoscopy. In the interim I recommended her drinking 6-8 glasses of water per day, eating a high-fiber diet, and taking a fiber supplement. She states that she has felt better when she drank prune juice.  Followup 6 weeks  [name]. [name], MD, FACS General, Bariatric, & Minimally Invasive Surgery [facility], [place]

## 2011-08-31 NOTE — Patient Instructions (Signed)
Eat a high fiber diet - slowly add supplemental fiber to your diet over the next several weeks

## 2011-09-01 ENCOUNTER — Telehealth (INDEPENDENT_AMBULATORY_CARE_PROVIDER_SITE_OTHER): Payer: Self-pay | Admitting: General Surgery

## 2011-09-01 NOTE — Telephone Encounter (Signed)
Per Dr [name] appt moved to after colonoscopy scheduled 11/07/11. Spoke with patient.

## 2011-10-11 ENCOUNTER — Telehealth (INDEPENDENT_AMBULATORY_CARE_PROVIDER_SITE_OTHER): Payer: Self-pay

## 2011-10-11 NOTE — Telephone Encounter (Signed)
Pt calling in this am asking what she can take to help with loose BM's she has  Been having since Monday. The pt has a hx of c-diff in the past so she is very cautious in what she takes without asking the doctor first. The pt has no other symptoms no fevers or chills. The pt is scheduled to have a colonoscopy with Dr Ardis Hughs on 11/07/11. Please advise.

## 2011-10-11 NOTE — Telephone Encounter (Signed)
Spoke with patient, she has had no fever, chills or crampy abdominal pain. She is drinking plenty of liquids and staying on a high fiber diet. I encouraged her to keep doing those things and try immodium per Dr [name]. Patient to call back if any crampy abdominal pain, fever or chills.

## 2011-10-12 ENCOUNTER — Telehealth (INDEPENDENT_AMBULATORY_CARE_PROVIDER_SITE_OTHER): Payer: Self-pay | Admitting: General Surgery

## 2011-10-12 NOTE — Telephone Encounter (Signed)
PT CALLED TO REPORT THAT SHE STILL HAS DIARRHEA 4 TIMES TODAY AND THAT SHE IS EXPERIENCING SOME ABDOMINAL CRAMPING. SHE IS EATING, NO NAUSEA. I ASKED IF SHE TRIED THE IMMODIUM AN SHE SAID SHE HESITATED TO TAKE IT, BUT THAT SHE WOULD TRY IT IF DR. [name] WANTED HER TO. DR. [name] NOTIFIED AND HE SAID FOR PT TO TRY THE IMMODIUM AND DRINK PLENTY OF FLUIDS. CALL us IN AM IF NOT IMPROVED FOR FOLLOW UP TESTING. PT NOTIFIED/GY

## 2011-10-13 ENCOUNTER — Telehealth (INDEPENDENT_AMBULATORY_CARE_PROVIDER_SITE_OTHER): Payer: Self-pay | Admitting: General Surgery

## 2011-10-13 NOTE — Telephone Encounter (Signed)
Spoke with pt. Ate picnic food over weekend. Son-in-law had upset stomach on Monday but resolved now. Pt has had intermittent loose stool for several days. No f/c/melena. Took imodium twice yesterday and loose stools stopped last evening. Had a BM around 1pm today and it was loose/watery. Nothing like stool when had Cdiff. No recent abx. Encouraged pt to drink plenty of liquids, high fiber diet, imodium per package directions. Call if symptoms worsen. Low suspicion for recurrent Cdiff

## 2011-10-13 NOTE — Telephone Encounter (Signed)
Pt calling to report she still has diarrhea.  She was instructed yesterday by nurse to take Immodium 2 pills initially and another at 6 hours, which she did.  She did not take any others after that, but her diarrhea improved.  Today she has not taken any Immodium and the diarrhea has returned.  Will page Dr. [name] for advice.

## 2011-10-18 ENCOUNTER — Encounter (INDEPENDENT_AMBULATORY_CARE_PROVIDER_SITE_OTHER): Payer: Medicare Other | Admitting: General Surgery

## 2011-10-18 ENCOUNTER — Telehealth: Payer: Self-pay | Admitting: Gastroenterology

## 2011-10-18 NOTE — Telephone Encounter (Signed)
Pt is scheduled for a direct colon on 11/07/11, previsit for 10/24/11.  The previsit was cx and an office appt was made for 10/20/11.  The pt has developed diarrhea x 2 weeks.  Imodium helps but quickly returns

## 2011-10-20 ENCOUNTER — Encounter: Payer: Self-pay | Admitting: Gastroenterology

## 2011-10-20 ENCOUNTER — Ambulatory Visit (INDEPENDENT_AMBULATORY_CARE_PROVIDER_SITE_OTHER): Payer: Medicare Other | Admitting: Gastroenterology

## 2011-10-20 ENCOUNTER — Other Ambulatory Visit (INDEPENDENT_AMBULATORY_CARE_PROVIDER_SITE_OTHER): Payer: Medicare Other

## 2011-10-20 VITALS — BP 118/50 | HR 68 | Ht 66.5 in | Wt 138.2 lb

## 2011-10-20 DIAGNOSIS — R197 Diarrhea, unspecified: Secondary | ICD-10-CM

## 2011-10-20 LAB — BASIC METABOLIC PANEL
BUN: 18 mg/dL (ref 6–23)
GFR: 82.62 mL/min (ref >60.00)
Potassium: 3.9 mEq/L (ref 3.5–5.1)
Sodium: 140 mEq/L (ref 135–145)

## 2011-10-20 LAB — CBC WITH DIFFERENTIAL/PLATELET
Basophils Relative: 0.6 % (ref 0.0–3.0)
Eosinophils Relative: 7.1 % — ABNORMAL HIGH (ref 0.0–5.0)
HCT: 37.4 % (ref 36.0–46.0)
Lymphs Abs: 1.4 10*3/uL (ref 0.7–4.0)
Monocytes Relative: 11.6 % (ref 3.0–12.0)
Neutrophils Relative %: 45.8 % (ref 43.0–77.0)
Platelets: 128 10*3/uL — ABNORMAL LOW (ref 150.0–400.0)
RBC: 4.32 Mil/uL (ref 3.87–5.11)
WBC: 3.9 10*3/uL — ABNORMAL LOW (ref 4.5–10.5)

## 2011-10-20 MED ORDER — METRONIDAZOLE 250 MG PO TABS: 500.0000 mg | ORAL_TABLET | Freq: Three times a day (TID) | ORAL | Status: AC

## 2011-10-20 MED ORDER — MOVIPREP 100 G PO SOLR: 1.0000 | ORAL | Status: DC

## 2011-10-20 NOTE — Progress Notes (Signed)
HPI: This is a   very pleasant 71 year old woman whom I am meeting for the first time today. She is with her husband today.   2012 AMI, had CABG Dr. Ricard Dillon.  Went home, had terrible diarrhea, eventually found to have C. Diff coltis, eventually perforated sigmoid, s/p segmental colectomy, reversed 2/13.  In April she had post op visit with Dr. [name], LLQ pains and blood in stool.  Since 27th may, diarrhea has returned.  Past 2 nights nocturnal diarrhea.  Usually goes 5-6 times.   NO antibiotics since diarrhea started.  Some left sided discomforts still.  No fevers or chills.  No nausea or vomiting.  She had colonoscopy in [place], was normal, no polyps.   Review of systems: Pertinent positive and negative review of systems were noted in the above HPI section. Complete review of systems was performed and was otherwise normal.    Past Medical History  Diagnosis Date  . Coronary artery disease     a. NSTEMI 7/12, cath: pLAD 30%, mLAD 60-70%, dLAD 80%, oOM2 50%, mOM2 40%, trifurcation 80%, pRCA 25%, then occluded, L-R collats, EF 55%;   b. 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 Roxy Manns)  . Hyperlipidemia     takes Pravastatin daily  . Pericarditis 1980    hx of  . GERD (gastroesophageal reflux disease)   . Palpitations 2003    hx of; Holter in 2003 with PACs and PVCs  . Atrial fibrillation     post-op after CABG;  treated with Amiodarone and coumadin until readmxn with CDiff colitis, perf colon and elevated LFTs (amio and coumadin stopped)  . C. difficile colitis 07/5595    complicated post CABG course with prolonged admission, perforated sigmoid colon;  s/p sigmoid colectomy with end colostomy [name] procedure);  laparotomy wound infection with Pseudomonas treated with antibxs and secondary intention  . 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 Surgical wound infection     Pseudomonas; wound VAC; secondary intention  . 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 Hair loss     after heart surgery  . Hypertension     takes Metoprolol daily  . Mitral valve prolapse     was told this in the late 1990's; ;then with Dr.Mclean he states that she doesn't  . Arthritis     neck and back  . PONV (postoperative nausea and vomiting)     w/appy  . Complication of anesthesia 12/2010    "had trouble waking me up after colon OR"  . Myocardial infarction 11/2010  . History of bronchitis     late 1980's  . Anemia     "in my early teens"  . H/O hiatal hernia     "slight; not having problems with it"  . Seizures     "w/verapimil"  . Hemorrhoids     "none since hemorrhoid OR"    Past Surgical History  Procedure Date  . Oophorectomy 2005    left; w/"mass removal"  . Hemorrhoid surgery ~ 2005    x 2  . Sigmoidectomy 12/18/2010    with end colostomy  . Appendectomy     as a child  . Colonoscopy   . Esophagogastroduodenoscopy   . Tubal ligation   . Colostomy reversal 06/27/11  . Coronary artery bypass graft 11-25-2010    CABG X3; LIMA to LAD, SVG to PDA, SVG to OM, EVH via right  thigh  . Cardiac catheterization 11/21/10  . Proctoscopy 06/27/2011    Procedure: PROCTOSCOPY;  Surgeon: Gayland Curry, MD;  Location: [facility];  Service: General;  Laterality: N/A;    Current Outpatient Prescriptions  Medication Sig Dispense Refill  . aspirin 81 MG chewable tablet Chew 81 mg by mouth daily.       . Bacillus Coagulans-Inulin (PROBIOTIC-PREBIOTIC) 1-250 BILLION-MG CAPS Take 1 capsule by mouth daily.       . Calcium Carbonate-Vitamin D 600-400 MG-UNIT per tablet Take 1 tablet by mouth daily.       . Coenzyme Q10 200 MG TABS Take 1 tablet by mouth daily.     0  . Methylcellulose, Laxative, (FIBER THERAPY) 500 MG TABS Take 1 tablet by mouth daily.      . metoprolol succinate (TOPROL-XL) 25 MG 24 hr tablet Taking half tab a day      . Multiple Vitamin (MULTIVITAMIN) tablet Take 1 tablet by mouth daily.       . pravastatin (PRAVACHOL) 40 MG tablet Take 1  tablet (40 mg total) by mouth every evening.  30 tablet  11  . vitamin C (ASCORBIC ACID) 500 MG tablet Take 500 mg by mouth daily.         Allergies as of 10/20/2011 - Review Complete 10/20/2011  Allergen Reaction Noted  . Sulfonamide derivatives Anaphylaxis   . Nadolol Hives 08/02/2009  . Other Other (See Comments) 06/22/2011  . Verapamil Other (See Comments)     Family History  Problem Relation Age of Onset  . Heart attack Father 57    died  . Heart disease Father     massive heart attack  . Coronary artery disease Mother 98    [place]  . Heart disease Mother   . Anesthesia problems Neg Hx   . Hypotension Neg Hx   . Malignant hyperthermia Neg Hx   . Pseudochol deficiency Neg Hx     History   Social History  . Marital Status: Married    Spouse Name: N/A    Number of Children: 1  . Years of Education: N/A   Occupational History  . LPN    Social History Main Topics  . Smoking status: Never Smoker   . Smokeless tobacco: Never Used  . Alcohol Use: No  . Drug Use: No  . Sexually Active: Yes    Birth Control/ Protection: Post-menopausal   Other Topics Concern  . Not on file   Social History Narrative  . No narrative on file       Physical Exam: BP 118/50  Pulse 68  Ht 5' 6.5" (1.689 m)  Wt 138 lb 4 oz (62.71 kg)  BMI 21.98 kg/m2 Constitutional: generally well-appearing Psychiatric: alert and oriented x3 Eyes: extraocular movements intact Mouth: oral pharynx moist, no lesions Neck: supple no lymphadenopathy Cardiovascular: heart regular rate and rhythm Lungs: clear to auscultation bilaterally Abdomen: soft, nontender, nondistended, no obvious ascites, no peritoneal signs, normal bowel sounds Extremities: no lower extremity edema bilaterally Skin: no lesions on visible extremities    Assessment and plan: 71 y.o. female with  mild left lower quadrant discomfort, acute diarrhea, recent severe Clostridium difficile  I'm going to put  her on Clostridium difficile antibiotics for now. 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 On the other hand she might have an anastomotic narrowing from her sigmoid colectomy, takedown earlier this year. She is on at stool testing for  Clostridium difficile as well as routine culture, CBC, basic metabolic profile. She is already on schedule to have a colonoscopy me later this month and for now to keep that scheduled. I may change the date based on her clinical course, results of the above tests.

## 2011-10-20 NOTE — Patient Instructions (Signed)
Keep your upcoming colonoscopy appt, this may be changed or cancelled depending on your course the next few weeks. Start flagyl 56m pills, take one pill three times a day. You will have labs checked today in the basement lab.  Please head down after you check out with the front desk  (stool for c diff by PCR, stool routine culture, bmet, cbc). Call Dr. JArdis Hughs office in 4-5 days to report on your symptoms. Change your imodium, take 1-2 pills every AM (on scheduled basis, only stop if you become constipated).

## 2011-10-23 ENCOUNTER — Other Ambulatory Visit: Payer: Medicare Other

## 2011-10-23 DIAGNOSIS — R197 Diarrhea, unspecified: Secondary | ICD-10-CM

## 2011-10-24 LAB — CLOSTRIDIUM DIFFICILE BY PCR: Toxigenic C. Difficile by PCR: NOT DETECTED

## 2011-10-25 ENCOUNTER — Telehealth: Payer: Self-pay | Admitting: Gastroenterology

## 2011-10-25 NOTE — Telephone Encounter (Signed)
Pt aware and colon has been rescheduled

## 2011-10-25 NOTE — Telephone Encounter (Signed)
The pt is calling because she is on the last day of her antibiotic (flagyl), she is dizzy and wants to know if she should get a refill.  Also her diarrhea is not any better.  Her C diff is negative.  Please advise.  The pt ask me to return call to [phone]

## 2011-10-25 NOTE — Telephone Encounter (Signed)
With neg c. Diff i would not restart flagyl.  She can increase imodium to 2 pills twice daily (scheduled) and lets do the colonoscopy sooner (can do this next Friday afternoon).

## 2011-10-27 LAB — STOOL CULTURE

## 2011-11-03 ENCOUNTER — Ambulatory Visit (AMBULATORY_SURGERY_CENTER): Payer: Medicare Other | Admitting: Gastroenterology

## 2011-11-03 ENCOUNTER — Encounter: Payer: Self-pay | Admitting: Gastroenterology

## 2011-11-03 ENCOUNTER — Telehealth: Payer: Self-pay

## 2011-11-03 VITALS — BP 166/94 | HR 82 | Temp 97.9°F | Resp 16 | Ht 66.0 in | Wt 138.0 lb

## 2011-11-03 DIAGNOSIS — K624 Stenosis of anus and rectum: Secondary | ICD-10-CM

## 2011-11-03 DIAGNOSIS — R197 Diarrhea, unspecified: Secondary | ICD-10-CM

## 2011-11-03 DIAGNOSIS — K56699 Other intestinal obstruction unspecified as to partial versus complete obstruction: Secondary | ICD-10-CM

## 2011-11-03 DIAGNOSIS — K56609 Unspecified intestinal obstruction, unspecified as to partial versus complete obstruction: Secondary | ICD-10-CM

## 2011-11-03 MED ORDER — SODIUM CHLORIDE 0.9 % IV SOLN: 500.0000 mL | INTRAVENOUS | Status: DC

## 2011-11-03 NOTE — Telephone Encounter (Signed)
Flexible sigmoidoscopy next week at [facility] or [facility] to repeat dilation  of stricture.

## 2011-11-03 NOTE — Op Note (Signed)
[address] [place], [place]  [id number]  COLONOSCOPY PROCEDURE REPORT  PATIENT:  Buffey, Zabinski  MR#:  [id number] BIRTHDATE:  [birth year], 71 yrs. old  GENDER:  female ENDOSCOPIST:  Milus Banister, MD PROCEDURE DATE:  11/03/2011 PROCEDURE:  Incomplete colonoscopy, Colonoscopy with Balloon Dilation of Stricture ASA CLASS:  Class III INDICATIONS:  2012 severe C. diff colitis, perforated sigmoid colon, s/p segmetnal resection and ostomy, reversed 06/2011; has had loose stools MEDICATIONS:  Fentanyl 50 mcg IV, These medications were titrated to patient response per physician's verbal order, Versed 7 mg IV  DESCRIPTION OF PROCEDURE:   After the risks benefits and alternatives of the procedure were thoroughly explained, informed consent was obtained.  Digital rectal exam was performed and revealed no rectal masses.   The LB [id number] endoscope was introduced through the anus and advanced to the anastomosis, limited by a stricture.    The quality of the prep was good..  The instrument was then slowly withdrawn as the colon was fully examined. <<PROCEDUREIMAGES>> FINDINGS:  A stricture was found. There was a fairly tight, focal anastomotic stricture about 15cm from anal verge. The lumen through the stricture was 10m, this was dilated to 157musing CRE over the wire balloon, held inflated for 1 minute. There was the usual minor mucosal tear and self limited oozing of blood following dilation (see image2, image7, and image8).   The adult colonoscope could still not advance through the stricture following dilation. COMPLICATIONS:  None  ENDOSCOPIC IMPRESSION: 1) Anastomotic stricture, 44m23mumen. This was dilated to 74m20mday.  RECOMMENDATIONS: [facility].  REPEAT EXAM:  next week  ______________________________ DaniMilus Banister  cc: EricGreer Pickerel  n. eSIGNED:   DaniMilus Banister06/21/2013 02:32 PM  LindKy [id number]

## 2011-11-03 NOTE — Patient Instructions (Signed)
YOU HAD AN ENDOSCOPIC PROCEDURE TODAY AT THE [facility]: Refer to the procedure report that was given to you for any specific questions about what was found during the examination.  If the procedure report does not answer your questions, please call your gastroenterologist to clarify.  If you requested that your care partner not be given the details of your procedure findings, then the procedure report has been included in a sealed envelope for you to review at your convenience later.  YOU SHOULD EXPECT: Some feelings of bloating in the abdomen. Passage of more gas than usual.  Walking can help get rid of the air that was put into your GI tract during the procedure and reduce the bloating. If you had a lower endoscopy (such as a colonoscopy or flexible sigmoidoscopy) you may notice spotting of blood in your stool or on the toilet paper. If you underwent a bowel prep for your procedure, then you may not have a normal bowel movement for a few days.  DIET: Your first meal following the procedure should be a light meal and then it is ok to progress to your normal diet.  A half-sandwich or bowl of soup is an example of a good first meal.  Heavy or fried foods are harder to digest and may make you feel nauseous or bloated.  Likewise meals heavy in dairy and vegetables can cause extra gas to form and this can also increase the bloating.  Drink plenty of fluids but you should avoid alcoholic beverages for 24 hours.  ACTIVITY: Your care partner should take you home directly after the procedure.  You should plan to take it easy, moving slowly for the rest of the day.  You can resume normal activity the day after the procedure however you should NOT DRIVE or use heavy machinery for 24 hours (because of the sedation medicines used during the test).    SYMPTOMS TO REPORT IMMEDIATELY: A gastroenterologist can be reached at any hour.  During normal business hours, 8:30 AM to 5:00 PM Monday through Friday,  call [phone].  After hours and on weekends, please call the GI answering service at [phone] who will take a message and have the physician on call contact you.   Following lower endoscopy (colonoscopy or flexible sigmoidoscopy):  Excessive amounts of blood in the stool  Significant tenderness or worsening of abdominal pains  Swelling of the abdomen that is new, acute  Fever of 100F or higher  Following upper endoscopy (EGD)  Vomiting of blood or coffee ground material  New chest pain or pain under the shoulder blades  Painful or persistently difficult swallowing  New shortness of breath  Fever of 100F or higher  Black, tarry-looking stools  FOLLOW UP: If any biopsies were taken you will be contacted by phone or by letter within the next 1-3 weeks.  Call your gastroenterologist if you have not heard about the biopsies in 3 weeks.  Our staff will call the home number listed on your records the next business day following your procedure to check on you and address any questions or concerns that you may have at that time regarding the information given to you following your procedure. This is a courtesy call and so if there is no answer at the home number and we have not heard from you through the emergency physician on call, we will assume that you have returned to your regular daily activities without incident.  SIGNATURES/CONFIDENTIALITY: You and/or your care   partner have signed paperwork which will be entered into your electronic medical record.  These signatures attest to the fact that that the information above on your After Visit Summary has been reviewed and is understood.  Full responsibility of the confidentiality of this discharge information lies with you and/or your care-partner.  

## 2011-11-06 ENCOUNTER — Other Ambulatory Visit: Payer: Self-pay

## 2011-11-06 ENCOUNTER — Telehealth: Payer: Self-pay | Admitting: *Deleted

## 2011-11-06 ENCOUNTER — Telehealth: Payer: Self-pay | Admitting: Gastroenterology

## 2011-11-06 NOTE — Telephone Encounter (Signed)
Pt has been instructed see alternate note

## 2011-11-06 NOTE — Telephone Encounter (Signed)
  Follow up Call-  Call back number 11/03/2011  Post procedure Call Back phone  # [phone]  Permission to leave phone message Yes     Patient questions:  Do you have a fever, pain , or abdominal swelling? no Pain Score  0 *  Have you tolerated food without any problems? yes  Have you been able to return to your normal activities? yes  Do you have any questions about your discharge instructions: Diet   no Medications  no Follow up visit  no  Do you have questions or concerns about your Care? yes  Actions: * If pain score is 4 or above: No action needed, pain <4.Pt c/o pain that she feels may be a urinary tract infection and expressed concern with who to call because she has had c-diff in past and is very scared of antibiotics.

## 2011-11-06 NOTE — Telephone Encounter (Signed)
Left message on machine to call back  

## 2011-11-06 NOTE — Telephone Encounter (Signed)
She should see her PCP or urgent clinic if she thinks she has UTI.  Would be ok for her to take antibiotics if there is high suspicion that she has UTI.  Does need follow up flex sig this week for dilation.

## 2011-11-06 NOTE — Telephone Encounter (Signed)
Dr. Ardis Hughs , please see pt's concern that she may have a UTI. Initial resonse was to refer pt to primary Dr. ,but then pt told me she is very concerned and worried about taking antibiotics since her history of c-diff and her stricture in sigmoid;therefore wanted to check with you first. She is for repeat sigmoidoscopy in a week so is expecting a call from your office to set that up.               Select Font Size      Small Medium Large Extra Extra Large             [name]  Description:  71 year old female  11/06/2011 8:26 AM Telephone Provider:  Rush Barer, RN  MRN: [id number] Department:  [facility]                Call Documentation     Owens Loffler, MD 11/06/2011 1:10 PM Signed  She should see her PCP or urgent clinic if she thinks she has UTI. Would be ok for her to take antibiotics if there is high suspicion that she has UTI. Does need follow up flex sig this week for dilation.

## 2011-11-06 NOTE — Telephone Encounter (Signed)
Pt aware and meds reviewed pt instructed and question answered

## 2011-11-06 NOTE — Telephone Encounter (Signed)
Spoke with Patty,cma. Chong Sicilian will discuss that pt. needs to f/u with primary care phsician for symtoms of UTI  when she calls to set up her sigmoidoscopy.

## 2011-11-06 NOTE — Telephone Encounter (Signed)
Pt has been scheduled for 11/09/11 [facility] 915 am needs to arrive at 715 am.

## 2011-11-07 ENCOUNTER — Other Ambulatory Visit: Payer: Medicare Other | Admitting: Gastroenterology

## 2011-11-08 ENCOUNTER — Telehealth (INDEPENDENT_AMBULATORY_CARE_PROVIDER_SITE_OTHER): Payer: Self-pay | Admitting: General Surgery

## 2011-11-08 NOTE — Telephone Encounter (Signed)
It looks like they have patient scheduled for a flex sig - is this okay to proceed with?

## 2011-11-08 NOTE — Telephone Encounter (Signed)
Ok per Dr [name]. Spoke with patient. Her actual question was whether she should wait until she was done with the stretching of that area before she follows up with [name]. I did tell her to wait until she has completed treatment with Dr Ardis Hughs and call us afterwards for a follow up appt. She will follow up after and call as needed prior. Appt for Friday cancelled.

## 2011-11-08 NOTE — Telephone Encounter (Signed)
Message copied by Margarette Asal on Wed Nov 08, 2011  7:34 AM ------      Message from: Jackquline Denmark      Created: Tue Nov 07, 2011 11:25 AM      Contact: [phone]       Patient has seen GI and they are wanting to do a procedure on her, does she wait until that's done to see Dr. Nonnie Done, please call.

## 2011-11-09 ENCOUNTER — Ambulatory Visit (HOSPITAL_COMMUNITY)
Admission: RE | Admit: 2011-11-09 | Discharge: 2011-11-09 | Disposition: A | Discharge status: Home / Self Care | Payer: Medicare Other | Source: Ambulatory Visit | Attending: Gastroenterology | Admitting: Gastroenterology

## 2011-11-09 ENCOUNTER — Encounter (HOSPITAL_COMMUNITY)
Admission: RE | Disposition: A | Discharge status: Home / Self Care | Payer: Self-pay | Source: Ambulatory Visit | Attending: Gastroenterology

## 2011-11-09 ENCOUNTER — Encounter (HOSPITAL_COMMUNITY): Payer: Self-pay | Admitting: *Deleted

## 2011-11-09 ENCOUNTER — Telehealth: Payer: Self-pay

## 2011-11-09 DIAGNOSIS — Z951 Presence of aortocoronary bypass graft: Secondary | ICD-10-CM | POA: Insufficient documentation

## 2011-11-09 DIAGNOSIS — Z79899 Other long term (current) drug therapy: Secondary | ICD-10-CM | POA: Insufficient documentation

## 2011-11-09 DIAGNOSIS — K624 Stenosis of anus and rectum: Secondary | ICD-10-CM

## 2011-11-09 DIAGNOSIS — K913 Postprocedural intestinal obstruction, unspecified as to partial versus complete: Secondary | ICD-10-CM

## 2011-11-09 DIAGNOSIS — Z7982 Long term (current) use of aspirin: Secondary | ICD-10-CM | POA: Insufficient documentation

## 2011-11-09 DIAGNOSIS — E785 Hyperlipidemia, unspecified: Secondary | ICD-10-CM | POA: Insufficient documentation

## 2011-11-09 DIAGNOSIS — I2510 Atherosclerotic heart disease of native coronary artery without angina pectoris: Secondary | ICD-10-CM | POA: Insufficient documentation

## 2011-11-09 DIAGNOSIS — I10 Essential (primary) hypertension: Secondary | ICD-10-CM | POA: Insufficient documentation

## 2011-11-09 DIAGNOSIS — I252 Old myocardial infarction: Secondary | ICD-10-CM | POA: Insufficient documentation

## 2011-11-09 DIAGNOSIS — K219 Gastro-esophageal reflux disease without esophagitis: Secondary | ICD-10-CM | POA: Insufficient documentation

## 2011-11-09 DIAGNOSIS — K56609 Unspecified intestinal obstruction, unspecified as to partial versus complete obstruction: Secondary | ICD-10-CM

## 2011-11-09 HISTORY — DX: Chronic kidney disease, unspecified: N18.9

## 2011-11-09 HISTORY — PX: FLEXIBLE SIGMOIDOSCOPY: SHX5431

## 2011-11-09 SURGERY — SIGMOIDOSCOPY, FLEXIBLE
Anesthesia: Moderate Sedation

## 2011-11-09 MED ORDER — FENTANYL CITRATE 0.05 MG/ML IJ SOLN
INTRAMUSCULAR | Status: DC | PRN
  Administered 2011-11-09 (×2): 25 ug via INTRAVENOUS

## 2011-11-09 MED ORDER — MIDAZOLAM HCL 10 MG/2ML IJ SOLN
INTRAMUSCULAR | Status: AC
  Filled 2011-11-09: qty 2

## 2011-11-09 MED ORDER — MIDAZOLAM HCL 10 MG/2ML IJ SOLN
INTRAMUSCULAR | Status: DC | PRN
  Administered 2011-11-09 (×2): 2 mg via INTRAVENOUS
  Administered 2011-11-09: 1 mg via INTRAVENOUS

## 2011-11-09 MED ORDER — FENTANYL CITRATE 0.05 MG/ML IJ SOLN
INTRAMUSCULAR | Status: AC
  Filled 2011-11-09: qty 2

## 2011-11-09 NOTE — H&P (View-Only) (Signed)
HPI: This is a   very pleasant 71 year old woman whom I am meeting for the first time today. She is with her husband today.   2012 [facility], had CABG Dr. Ricard Dillon.  Went home, had terrible diarrhea, eventually found to have C. Diff coltis, eventually perforated sigmoid, s/p segmental colectomy, reversed 2/13.  In April she had post op visit with Dr. [name], LLQ pains and blood in stool.  Since 27th may, diarrhea has returned.  Past 2 nights nocturnal diarrhea.  Usually goes 5-6 times.   NO antibiotics since diarrhea started.  Some left sided discomforts still.  No fevers or chills.  No nausea or vomiting.  She had colonoscopy in [place], was normal, no polyps.   Review of systems: Pertinent positive and negative review of systems were noted in the above HPI section. Complete review of systems was performed and was otherwise normal.    Past Medical History  Diagnosis Date  . Coronary artery disease     a. NSTEMI 7/12, cath: pLAD 30%, mLAD 60-70%, dLAD 80%, oOM2 50%, mOM2 40%, trifurcation 80%, pRCA 25%, then occluded, L-R collats, EF 55%;   b. 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 Roxy Manns)  . Hyperlipidemia     takes Pravastatin daily  . Pericarditis 1980    hx of  . GERD (gastroesophageal reflux disease)   . Palpitations 2003    hx of; Holter in 2003 with PACs and PVCs  . Atrial fibrillation     post-op after CABG;  treated with Amiodarone and coumadin until readmxn with CDiff colitis, perf colon and elevated LFTs (amio and coumadin stopped)  . C. difficile colitis 08/84    complicated post CABG course with prolonged admission, perforated sigmoid colon;  s/p sigmoid colectomy with end colostomy [name] procedure);  laparotomy wound infection with Pseudomonas treated with antibxs and secondary intention  . 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 Surgical wound infection     Pseudomonas; wound VAC; secondary intention  . 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 Hair loss     after heart surgery  . Hypertension     takes Metoprolol daily  . Mitral valve prolapse     was told this in the late 1990's; ;then with Dr.Mclean he states that she doesn't  . Arthritis     neck and back  . PONV (postoperative nausea and vomiting)     w/appy  . Complication of anesthesia 12/2010    "had trouble waking me up after colon OR"  . Myocardial infarction 11/2010  . History of bronchitis     late 1980's  . Anemia     "in my early teens"  . H/O hiatal hernia     "slight; not having problems with it"  . Seizures     "w/verapimil"  . Hemorrhoids     "none since hemorrhoid OR"    Past Surgical History  Procedure Date  . Oophorectomy 2005    left; w/"mass removal"  . Hemorrhoid surgery ~ 2005    x 2  . Sigmoidectomy 12/18/2010    with end colostomy  . Appendectomy     as a child  . Colonoscopy   . Esophagogastroduodenoscopy   . Tubal ligation   . Colostomy reversal 06/27/11  . Coronary artery bypass graft 11-25-2010    CABG X3; LIMA to LAD, SVG to PDA, SVG to OM, EVH via right  thigh  . Cardiac catheterization 11/21/10  . Proctoscopy 06/27/2011    Procedure: PROCTOSCOPY;  Surgeon: Gayland Curry, MD;  Location: [place];  Service: General;  Laterality: N/A;    Current Outpatient Prescriptions  Medication Sig Dispense Refill  . aspirin 81 MG chewable tablet Chew 81 mg by mouth daily.       . Bacillus Coagulans-Inulin (PROBIOTIC-PREBIOTIC) 1-250 BILLION-MG CAPS Take 1 capsule by mouth daily.       . Calcium Carbonate-Vitamin D 600-400 MG-UNIT per tablet Take 1 tablet by mouth daily.       . Coenzyme Q10 200 MG TABS Take 1 tablet by mouth daily.     0  . Methylcellulose, Laxative, (FIBER THERAPY) 500 MG TABS Take 1 tablet by mouth daily.      . metoprolol succinate (TOPROL-XL) 25 MG 24 hr tablet Taking half tab a day      . Multiple Vitamin (MULTIVITAMIN) tablet Take 1 tablet by mouth daily.       . pravastatin (PRAVACHOL) 40 MG tablet Take 1  tablet (40 mg total) by mouth every evening.  30 tablet  11  . vitamin C (ASCORBIC ACID) 500 MG tablet Take 500 mg by mouth daily.         Allergies as of 10/20/2011 - Review Complete 10/20/2011  Allergen Reaction Noted  . Sulfonamide derivatives Anaphylaxis   . Nadolol Hives 08/02/2009  . Other Other (See Comments) 06/22/2011  . Verapamil Other (See Comments)     Family History  Problem Relation Age of Onset  . Heart attack Father 83    died  . Heart disease Father     massive heart attack  . Coronary artery disease Mother 79    [place]  . Heart disease Mother   . Anesthesia problems Neg Hx   . Hypotension Neg Hx   . Malignant hyperthermia Neg Hx   . Pseudochol deficiency Neg Hx     History   Social History  . Marital Status: Married    Spouse Name: N/A    Number of Children: 1  . Years of Education: N/A   Occupational History  . LPN    Social History Main Topics  . Smoking status: Never Smoker   . Smokeless tobacco: Never Used  . Alcohol Use: No  . Drug Use: No  . Sexually Active: Yes    Birth Control/ Protection: Post-menopausal   Other Topics Concern  . Not on file   Social History Narrative  . No narrative on file       Physical Exam: BP 118/50  Pulse 68  Ht 5' 6.5" (1.689 m)  Wt 138 lb 4 oz (62.71 kg)  BMI 21.98 kg/m2 Constitutional: generally well-appearing Psychiatric: alert and oriented x3 Eyes: extraocular movements intact Mouth: oral pharynx moist, no lesions Neck: supple no lymphadenopathy Cardiovascular: heart regular rate and rhythm Lungs: clear to auscultation bilaterally Abdomen: soft, nontender, nondistended, no obvious ascites, no peritoneal signs, normal bowel sounds Extremities: no lower extremity edema bilaterally Skin: no lesions on visible extremities    Assessment and plan: 71 y.o. female with  mild left lower quadrant discomfort, acute diarrhea, recent severe Clostridium difficile  I'm going to put  her on Clostridium difficile antibiotics for now. 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 On the other hand she might have an anastomotic narrowing from her sigmoid colectomy, takedown earlier this year. She is on at stool testing for  Clostridium difficile as well as routine culture, CBC, basic metabolic profile. She is already on schedule to have a colonoscopy me later this month and for now to keep that scheduled. I may change the date based on her clinical course, results of the above tests.

## 2011-11-09 NOTE — Discharge Instructions (Signed)

## 2011-11-09 NOTE — Telephone Encounter (Signed)
Message copied by Barron Alvine on Thu Nov 09, 2011  9:37 AM ------      Message from: Owens Loffler P      Created: Thu Nov 09, 2011  9:28 AM       Randall Hiss,            I love these cases.  The stricture is improving, will dilate again next week, then probably but her out 2-3 weeks from there.            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  This is clearly improving by imaging and her stools are starting to firm up a bit after last week's initial dilation            RECOMMENDATIONS:       My office will get in touch to schedule repeat flex sig next week ([facility] or [facility]) for balloon dilation.                  Shaheen Star,      She needs repeat flex sig with balloon dilation next week ([facility] or [facility]), late next week preferably.  Could be M or Tues of the following if needed            Thanks

## 2011-11-09 NOTE — Interval H&P Note (Signed)
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 After consideration of risks, benefits and other options for treatment, the patient has consented to  Procedure(s) (LRB): FLEXIBLE SIGMOIDOSCOPY (N/A) as a surgical intervention .  The patient's history has been reviewed, patient examined, no change in status, stable for surgery.  I have reviewed the patients' chart and labs.  Questions were answered to the patient's satisfaction.     Owens Loffler

## 2011-11-09 NOTE — Op Note (Signed)
[facility] [address] [place], [place]  [address]  FLEXIBLE SIGMOIDOSCOPY PROCEDURE REPORT  PATIENT:  [name], [name]  MR#:  [id number] BIRTHDATE:  February 05, 1941, 24 yrs. old  GENDER:  female ENDOSCOPIST:  Milus Banister, MD PROCEDURE DATE:  11/09/2011 PROCEDURE:  Flex Sig w/ Balloon Dilation ASA CLASS:  Class II INDICATIONS:  anastomotic stricture, dilated initially last week to 31m MEDICATIONS:   Fentanyl 50 mcg IV, Versed 5 mg IV  DESCRIPTION OF PROCEDURE:   After the risks benefits and alternatives of the procedure were thoroughly explained, informed consent was obtained.  Digital rectal exam was performed and revealed no rectal masses.   The Colonoscope [id number] was introduced through the anus and advanced to the sigmoid colon, without limitations.  The quality of the prep was good.  The instrument was then slowly withdrawn as the mucosa was fully examined. <<PROCEDUREIMAGES>> The focal anastomotic stricture was again located (10-15cm from anus). The lumen was 8-924mthrough the stricture. This was dilated sequentially up to 1889mith CRE TTS balloon over wire. Following dilation I was able to advance standard adult colonoscope through the stricture. There was the usual amount of minor mucosal tear and self limited oozing of blood (see image3, image7, and image8). Retroflexion was not performed.  The scope was then withdrawn from the patient and the procedure terminated. COMPLICATIONS:  None  ENDOSCOPIC IMPRESSION: 1) Anastomotic stricture, dilated to 40m59mday.  This is clearly improving by imaging and her stools are starting to firm up a bit after last week's initial dilation  RECOMMENDATIONS: My office will get in touch to schedule repeat flex sig next week ([facility] or [facility]) for balloon dilation.  ______________________________ DaniMilus Banister  cc: EricGreer Pickerel  n. eSIGNED:   DaniMilus Banister06/27/2013 09:28 AM  LindKy [id number]

## 2011-11-10 ENCOUNTER — Encounter (HOSPITAL_COMMUNITY): Payer: Self-pay | Admitting: Gastroenterology

## 2011-11-10 ENCOUNTER — Encounter (INDEPENDENT_AMBULATORY_CARE_PROVIDER_SITE_OTHER): Payer: Medicare Other | Admitting: General Surgery

## 2011-11-10 NOTE — Telephone Encounter (Signed)
11/20/11 11 am flex with dil [facility]   11/15/11 11 am previsit  Left message on machine to call back

## 2011-11-13 NOTE — Telephone Encounter (Signed)
Pt has been given pre visit appt.

## 2011-11-15 ENCOUNTER — Ambulatory Visit (AMBULATORY_SURGERY_CENTER): Payer: Medicare Other | Admitting: *Deleted

## 2011-11-15 VITALS — Ht 66.0 in | Wt 137.1 lb

## 2011-11-15 DIAGNOSIS — Z1211 Encounter for screening for malignant neoplasm of colon: Secondary | ICD-10-CM

## 2011-11-15 DIAGNOSIS — K56609 Unspecified intestinal obstruction, unspecified as to partial versus complete obstruction: Secondary | ICD-10-CM

## 2011-11-20 ENCOUNTER — Ambulatory Visit (AMBULATORY_SURGERY_CENTER): Payer: Medicare Other | Admitting: Gastroenterology

## 2011-11-20 ENCOUNTER — Encounter: Payer: Self-pay | Admitting: Gastroenterology

## 2011-11-20 VITALS — BP 158/92 | HR 79 | Temp 96.5°F | Resp 18 | Ht 66.0 in | Wt 137.0 lb

## 2011-11-20 DIAGNOSIS — R197 Diarrhea, unspecified: Secondary | ICD-10-CM

## 2011-11-20 DIAGNOSIS — K56699 Other intestinal obstruction unspecified as to partial versus complete obstruction: Secondary | ICD-10-CM

## 2011-11-20 DIAGNOSIS — Z1211 Encounter for screening for malignant neoplasm of colon: Secondary | ICD-10-CM

## 2011-11-20 MED ORDER — HYOSCYAMINE SULFATE ER 0.375 MG PO TB12: 375.0000 ug | ORAL_TABLET | Freq: Two times a day (BID) | ORAL | Status: DC | PRN

## 2011-11-20 NOTE — Patient Instructions (Signed)
Our office will call you to schedule colonoscopy in 3-4 weeks. Thank you!!  YOU HAD AN ENDOSCOPIC PROCEDURE TODAY AT [facility]: Refer to the procedure report that was given to you for any specific questions about what was found during the examination.  If the procedure report does not answer your questions, please call your gastroenterologist to clarify.  If you requested that your care partner not be given the details of your procedure findings, then the procedure report has been included in a sealed envelope for you to review at your convenience later.  YOU SHOULD EXPECT: Some feelings of bloating in the abdomen. Passage of more gas than usual.  Walking can help get rid of the air that was put into your GI tract during the procedure and reduce the bloating. If you had a lower endoscopy (such as a colonoscopy or flexible sigmoidoscopy) you may notice spotting of blood in your stool or on the toilet paper. If you underwent a bowel prep for your procedure, then you may not have a normal bowel movement for a few days.  DIET: Your first meal following the procedure should be a light meal and then it is ok to progress to your normal diet.  A half-sandwich or bowl of soup is an example of a good first meal.  Heavy or fried foods are harder to digest and may make you feel nauseous or bloated.  Likewise meals heavy in dairy and vegetables can cause extra gas to form and this can also increase the bloating.  Drink plenty of fluids but you should avoid alcoholic beverages for 24 hours.  ACTIVITY: Your care partner should take you home directly after the procedure.  You should plan to take it easy, moving slowly for the rest of the day.  You can resume normal activity the day after the procedure however you should NOT DRIVE or use heavy machinery for 24 hours (because of the sedation medicines used during the test).    SYMPTOMS TO REPORT IMMEDIATELY: A gastroenterologist can be reached at any  hour.  During normal business hours, 8:30 AM to 5:00 PM Monday through Friday, call [phone].  After hours and on weekends, please call the GI answering service at [phone] who will take a message and have the physician on call contact you.   Following lower endoscopy (colonoscopy or flexible sigmoidoscopy):  Excessive amounts of blood in the stool  Significant tenderness or worsening of abdominal pains  Swelling of the abdomen that is new, acute  Fever of 100F or higher  FOLLOW UP: If any biopsies were taken you will be contacted by phone or by letter within the next 1-3 weeks.  Call your gastroenterologist if you have not heard about the biopsies in 3 weeks.  Our staff will call the home number listed on your records the next business day following your procedure to check on you and address any questions or concerns that you may have at that time regarding the information given to you following your procedure. This is a courtesy call and so if there is no answer at the home number and we have not heard from you through the emergency physician on call, we will assume that you have returned to your regular daily activities without incident.  SIGNATURES/CONFIDENTIALITY: You and/or your care partner have signed paperwork which will be entered into your electronic medical record.  These signatures attest to the fact that that the information above on your After Visit Summary  has been reviewed and is understood.  Full responsibility of the confidentiality of this discharge information lies with you and/or your care-partner.

## 2011-11-20 NOTE — Op Note (Signed)
[facility] [address] [place], [place]  [id number]  FLEXIBLE SIGMOIDOSCOPY PROCEDURE REPORT  PATIENT:  [name], [name]  MR#:  [id number] BIRTHDATE:  1941-03-21, 71 yrs. old  GENDER:  female ENDOSCOPIST:  Milus Banister, MD PROCEDURE DATE:  11/20/2011 PROCEDURE:  Flex Sig w/ Balloon Dilation ASA CLASS:  Class III INDICATIONS:  anastomotic stricture following colostomy takedown (dilated twice recently with improvement in overflow diarrhea, last dilated 10 days ago to 72m) MEDICATIONS:   Fentanyl 50 mcg IV, These medications were titrated to patient response per physician's verbal order, Versed 5 mg IV  DESCRIPTION OF PROCEDURE:   After the risks benefits and alternatives of the procedure were thoroughly explained, informed consent was obtained.  Digital rectal exam was performed and revealed no rectal masses.   The  endoscope was introduced through the anus and advanced to the descending colon, without limitations.  The quality of the prep was good.  The instrument was then slowly withdrawn as the mucosa was fully examined. <<PROCEDUREIMAGES>> The anastomotic stricture was located, lumen through this focal stricture was 188mand it was dilated up to 2033msing a CRE TTS balloon held inflated for 1 minute. There was usual minor self limited oozing following dilation (see image1, image3, image5, and image6).   Retroflexed views in the rectum revealed no abnormalities.    The scope was then withdrawn from the patient and the procedure terminated. COMPLICATIONS:  None  ENDOSCOPIC IMPRESSION: 1) Anastomotic stricture, dilated up to 8m71mECOMMENDATIONS: Dr. JacoArdis Hughsfice will call to schedule repeat examination (FULL COLONOSCOPY this time) in 3-4 weeks.  ______________________________ DaniMilus Banister  cc: EricGreer Pickerel  n. eSIGNED:   DaniMilus Banister07/12/2011 11:21 AM  Dominique [id number]

## 2011-11-20 NOTE — Progress Notes (Signed)
Patient did not experience any of the following events: a burn prior to discharge; a fall within the facility; wrong site/side/patient/procedure/implant event; or a hospital transfer or hospital admission upon discharge from the facility. (G8907) Patient did not have preoperative order for IV antibiotic SSI prophylaxis. (G8918)  

## 2011-11-21 ENCOUNTER — Telehealth: Payer: Self-pay

## 2011-11-21 NOTE — Telephone Encounter (Signed)
  Follow up Call-  Call back number 11/20/2011 11/03/2011  Post procedure Call Back phone  # [phone] hm [phone]  Permission to leave phone message Yes Yes     Patient questions:  Do you have a fever, pain , or abdominal swelling? no Pain Score  0 *  Have you tolerated food without any problems? yes  Have you been able to return to your normal activities? yes  Do you have any questions about your discharge instructions: Diet   no Medications  no Follow up visit  no  Do you have questions or concerns about your Care? no  Actions: * If pain score is 4 or above: No action needed, pain <4.

## 2011-12-01 ENCOUNTER — Encounter (INDEPENDENT_AMBULATORY_CARE_PROVIDER_SITE_OTHER): Payer: Self-pay

## 2011-12-13 ENCOUNTER — Telehealth: Payer: Self-pay

## 2011-12-13 NOTE — Telephone Encounter (Signed)
Left message on machine to call back  

## 2011-12-13 NOTE — Telephone Encounter (Signed)
Message copied by Barron Alvine on Wed Dec 13, 2011  8:18 AM ------      Message from: Barron Alvine      Created: Wed Nov 22, 2011  2:04 PM       Pt needs repeat full colon see 11/20/11 report

## 2011-12-14 NOTE — Telephone Encounter (Signed)
Pt has been scheduled for previsit and colon letter mailed

## 2011-12-19 ENCOUNTER — Encounter: Payer: Self-pay | Admitting: Gastroenterology

## 2011-12-19 ENCOUNTER — Ambulatory Visit (AMBULATORY_SURGERY_CENTER): Payer: Medicare Other

## 2011-12-19 VITALS — Ht 66.5 in | Wt 140.3 lb

## 2011-12-19 DIAGNOSIS — Z1211 Encounter for screening for malignant neoplasm of colon: Secondary | ICD-10-CM

## 2011-12-19 DIAGNOSIS — K56609 Unspecified intestinal obstruction, unspecified as to partial versus complete obstruction: Secondary | ICD-10-CM

## 2011-12-19 MED ORDER — MOVIPREP 100 G PO SOLR: 1.0000 | Freq: Once | ORAL | Status: DC

## 2011-12-26 ENCOUNTER — Ambulatory Visit (AMBULATORY_SURGERY_CENTER): Payer: Medicare Other | Admitting: Gastroenterology

## 2011-12-26 ENCOUNTER — Encounter: Payer: Self-pay | Admitting: Gastroenterology

## 2011-12-26 VITALS — BP 157/82 | HR 71 | Temp 98.4°F | Resp 26 | Ht 66.0 in | Wt 140.0 lb

## 2011-12-26 DIAGNOSIS — K56609 Unspecified intestinal obstruction, unspecified as to partial versus complete obstruction: Secondary | ICD-10-CM

## 2011-12-26 DIAGNOSIS — Z1211 Encounter for screening for malignant neoplasm of colon: Secondary | ICD-10-CM

## 2011-12-26 DIAGNOSIS — K56699 Other intestinal obstruction unspecified as to partial versus complete obstruction: Secondary | ICD-10-CM

## 2011-12-26 MED ORDER — SODIUM CHLORIDE 0.9 % IV SOLN: 500.0000 mL | INTRAVENOUS | Status: DC

## 2011-12-26 NOTE — Op Note (Signed)
[facility] [address]. [place], [place]  [address]  COLONOSCOPY PROCEDURE REPORT  PATIENT:  [name], [name]  MR#:  [id number] BIRTHDATE:  Jun 19, 1940, 71 yrs. old  GENDER:  female ENDOSCOPIST:  Milus Banister, MD PROCEDURE DATE:  12/26/2011 PROCEDURE:  Colonoscopy with Balloon Dilation of Stricture ASA CLASS:  Class II INDICATIONS:  anastomotic stricture, has been dilated 3 times, last was up to 25m about 4 weeks ago MEDICATIONS:   Fentanyl 50 mcg IV, These medications were titrated to patient response per physician's verbal order, Versed 5 mg IV  DESCRIPTION OF PROCEDURE:   After the risks benefits and alternatives of the procedure were thoroughly explained, informed consent was obtained.  Digital rectal exam was performed and revealed no rectal masses.   The LB CF-[id number] was introduced through the anus and advanced to the cecum, which was identified by both the appendix and ileocecal valve, without limitations.  The quality of the prep was good..  The instrument was then slowly withdrawn as the colon was fully examined. <<PROCEDUREIMAGES>> FINDINGS:  The distal sigmoid anastomotic stricture was easy to pass without dilation, allowing for complete examination of entire colon this time. No polyps or cancers were noted. I used 217mdilating balloon to once again dilate the region of stricture. There was no bleeding followign dilation. The mucosa just proximal to the anastomosis was edematous appearing (see image3, image6, and image8).  This was otherwise a normal examination of the colon (see image9, image2, and image1).   Retroflexed views in the rectum revealed no abnormalities. COMPLICATIONS:  None  ENDOSCOPIC IMPRESSION: 1) Sigmoid anastomosis stricture.  Able to be passed without dilation, allowing for complete examination of colon finally.  The stricture was dilated again using 2058mthe largest size) balloon, there was no resulting  bleeding. The mucosa just proximal to the anastomotic line was edematous, thickened.  I don't think further dilation will be helpful at this point. 2) Otherwise normal examination  RECOMMENDATIONS: 1) You should continue to follow colorectal cancer screening guidelines for "routine risk" patients with a repeat colonoscopy in 10 years. There is no need for FOBT (stool) testing for at least 5 years. 2) Start daily fiber supplement (citrucel). Return office visit with Dr. JacArdis Hughs 4-5 weeks  ______________________________ DanMilus BanisterD  n. eSIGNED:   DanMilus Banister 12/26/2011 11:20 AM  LinKy [phone]

## 2011-12-26 NOTE — Patient Instructions (Addendum)
YOU HAD AN ENDOSCOPIC PROCEDURE TODAY AT [facility]: Refer to the procedure report that was given to you for any specific questions about what was found during the examination.  If the procedure report does not answer your questions, please call your gastroenterologist to clarify.  If you requested that your care partner not be given the details of your procedure findings, then the procedure report has been included in a sealed envelope for you to review at your convenience later.  YOU SHOULD EXPECT: Some feelings of bloating in the abdomen. Passage of more gas than usual.  Walking can help get rid of the air that was put into your GI tract during the procedure and reduce the bloating. If you had a lower endoscopy (such as a colonoscopy or flexible sigmoidoscopy) you may notice spotting of blood in your stool or on the toilet paper. If you underwent a bowel prep for your procedure, then you may not have a normal bowel movement for a few days.  DIET: Your first meal following the procedure should be a light meal and then it is ok to progress to your normal diet.  A half-sandwich or bowl of soup is an example of a good first meal.  Heavy or fried foods are harder to digest and may make you feel nauseous or bloated.  Likewise meals heavy in dairy and vegetables can cause extra gas to form and this can also increase the bloating.  Drink plenty of fluids but you should avoid alcoholic beverages for 24 hours.  ACTIVITY: Your care partner should take you home directly after the procedure.  You should plan to take it easy, moving slowly for the rest of the day.  You can resume normal activity the day after the procedure however you should NOT DRIVE or use heavy machinery for 24 hours (because of the sedation medicines used during the test).    SYMPTOMS TO REPORT IMMEDIATELY: A gastroenterologist can be reached at any hour.  During normal business hours, 8:30 AM to 5:00 PM Monday through Friday,  call [phone].  After hours and on weekends, please call the GI answering service at [phone] who will take a message and have the physician on call contact you.   Following lower endoscopy (colonoscopy or flexible sigmoidoscopy):  Excessive amounts of blood in the stool  Significant tenderness or worsening of abdominal pains  Swelling of the abdomen that is new, acute  Fever of 100F or higher   FOLLOW UP: If any biopsies were taken you will be contacted by phone or by letter within the next 1-3 weeks.  Call your gastroenterologist if you have not heard about the biopsies in 3 weeks.  Our staff will call the home number listed on your records the next business day following your procedure to check on you and address any questions or concerns that you may have at that time regarding the information given to you following your procedure. This is a courtesy call and so if there is no answer at the home number and we have not heard from you through the emergency physician on call, we will assume that you have returned to your regular daily activities without incident.  SIGNATURES/CONFIDENTIALITY: You and/or your care partner have signed paperwork which will be entered into your electronic medical record.  These signatures attest to the fact that that the information above on your After Visit Summary has been reviewed and is understood.  Full responsibility of the confidentiality of  this discharge information lies with you and/or your care-partner.   Resume medications. Per report start daily fiber supplement. Return office visit 4-5 weeks.

## 2011-12-26 NOTE — Progress Notes (Signed)
Patient did not experience any of the following events: a burn prior to discharge; a fall within the facility; wrong site/side/patient/procedure/implant event; or a hospital transfer or hospital admission upon discharge from the facility. (G8907) Patient did not have preoperative order for IV antibiotic SSI prophylaxis. (G8918)  

## 2011-12-27 ENCOUNTER — Telehealth: Payer: Self-pay

## 2011-12-27 NOTE — Telephone Encounter (Signed)
  Follow up Call-  Call back number 12/26/2011 11/20/2011 11/03/2011  Post procedure Call Back phone  # [phone] hm [phone]  Permission to leave phone message Yes Yes Yes     Patient questions:  Do you have a fever, pain , or abdominal swelling? no Pain Score  0 *  Have you tolerated food without any problems? yes  Have you been able to return to your normal activities? yes  Do you have any questions about your discharge instructions: Diet   no Medications  no Follow up visit  no  Do you have questions or concerns about your Care? no  Actions: * If pain score is 4 or above: No action needed, pain <4.

## 2012-01-25 ENCOUNTER — Ambulatory Visit (INDEPENDENT_AMBULATORY_CARE_PROVIDER_SITE_OTHER): Payer: Medicare Other | Admitting: Gastroenterology

## 2012-01-25 ENCOUNTER — Encounter: Payer: Self-pay | Admitting: Gastroenterology

## 2012-01-25 VITALS — BP 104/70 | HR 64 | Ht 65.5 in | Wt 140.5 lb

## 2012-01-25 DIAGNOSIS — K56699 Other intestinal obstruction unspecified as to partial versus complete obstruction: Secondary | ICD-10-CM

## 2012-01-25 DIAGNOSIS — K56609 Unspecified intestinal obstruction, unspecified as to partial versus complete obstruction: Secondary | ICD-10-CM

## 2012-01-25 NOTE — Patient Instructions (Addendum)
Trial of one gas ex pill with every meal.  Continue on citrucel.  This is to try to help the bloating.   Call Dr. Ardis Hughs in 2-3 weeks to report on your symptoms.

## 2012-01-25 NOTE — Progress Notes (Signed)
Review of pertinent gastrointestinal problems: 1. C. Diff coltis, eventually perforated sigmoid, s/p segmental colectomy, reversed 2/13. 2. 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  She underwent 3-4 sequential sigmoidoscopies with dilation of the stricture. Eventually dilated up to 20 mm with very good results in her diarrhea.  Last examination was August 2013, this was a full colonoscopy. No polyps were seen. She did have proximal to the site of the previous stricture, the colon was somewhat edematous. 3. routine risk for colon cancer, next colonoscopy August 2023    HPI: This is a very pleasant 71 year old woman who is here with her husband today. I have been helping her with an anastomotic stricture that appears to have been completely dilated as of the last examination about one month ago. She is still left with some bloating at times as well as mild constipation.   Can feel bloated and gassy at times.  Constipated yesterday.  Citrucel every day.  Diarrhea is gone.  No bleeding.    Past Medical History  Diagnosis Date  . Coronary artery disease     a. NSTEMI 7/12, cath: pLAD 30%, mLAD 60-70%, dLAD 80%, oOM2 50%, mOM2 40%, trifurcation 80%, pRCA 25%, then occluded, L-R collats, EF 55%;   b. 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 Roxy Manns)  . Hyperlipidemia     takes Pravastatin daily  . Pericarditis 1980    hx of  . GERD (gastroesophageal reflux disease)   . Palpitations 2003    hx of; Holter in 2003 with PACs and PVCs  . Atrial fibrillation     post-op after CABG;  treated with Amiodarone and coumadin until readmxn with CDiff colitis, perf colon and elevated LFTs (amio and coumadin stopped)  . C. difficile colitis 09/8097    complicated post CABG course with prolonged admission, perforated sigmoid colon;  s/p sigmoid colectomy with end colostomy [name] procedure);  laparotomy wound infection with Pseudomonas treated with antibxs  and secondary intention  . 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 Surgical wound infection     Pseudomonas; wound VAC; secondary intention  . 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 Hair loss     after heart surgery  . Hypertension     takes Metoprolol daily  . Mitral valve prolapse     was told this in the late 1990's; ;then with Dr.Mclean he states that she doesn't  . Arthritis     neck and back  . PONV (postoperative nausea and vomiting)     w/appy  . Complication of anesthesia 12/2010    "had trouble waking me up after colon OR"  . Myocardial infarction 11/2010  . History of bronchitis     late 1980's  . Anemia     "in my early teens"  . H/O hiatal hernia     "slight; not having problems with it"  . Seizures     "w/verapimil"  . Hemorrhoids     "none since hemorrhoid OR"  . Chronic kidney disease     hx kidney stones    Past Surgical History  Procedure Date  . Oophorectomy 2005    left; w/"mass removal"  . Hemorrhoid surgery ~ 2005    x 2  . Sigmoidectomy 12/18/2010    with end colostomy  . Appendectomy     as a child  . Colonoscopy   . Esophagogastroduodenoscopy   .  Tubal ligation   . Colostomy reversal 06/27/11  . Coronary artery bypass graft 11-25-2010    CABG X3; LIMA to LAD, SVG to PDA, SVG to OM, EVH via right thigh  . Cardiac catheterization 11/21/10  . Proctoscopy 06/27/2011    Procedure: PROCTOSCOPY;  Surgeon: Gayland Curry, MD;  Location: [facility];  Service: General;  Laterality: N/A;  . Colostomy reconnect   . Flexible sigmoidoscopy 11/09/2011    Procedure: FLEXIBLE SIGMOIDOSCOPY;  Surgeon: Milus Banister, MD;  Location: [facility];  Service: Endoscopy;  Laterality: N/A;    Current Outpatient Prescriptions  Medication Sig Dispense Refill  . aspirin 81 MG chewable tablet Chew 81 mg by mouth daily.       . Bacillus Coagulans-Inulin (PROBIOTIC-PREBIOTIC) 1-250 BILLION-MG CAPS Take 1 capsule by mouth daily.       .  Calcium Carbonate-Vitamin D 600-400 MG-UNIT per tablet Take 1 tablet by mouth daily.       . Coenzyme Q10 200 MG TABS Take 1 tablet by mouth daily.     0  . methylcellulose (CITRUCEL) oral powder Take by mouth daily.      . metoprolol succinate (TOPROL-XL) 25 MG 24 hr tablet Taking half tab a day      . Multiple Vitamin (MULTIVITAMIN) tablet Take 1 tablet by mouth daily.       . pravastatin (PRAVACHOL) 40 MG tablet Take 1 tablet (40 mg total) by mouth every evening.  30 tablet  11  . vitamin C (ASCORBIC ACID) 500 MG tablet Take 500 mg by mouth daily.         Allergies as of 01/25/2012 - Review Complete 12/26/2011  Allergen Reaction Noted  . Sulfa antibiotics Hives 11/20/2011  . Sulfonamide derivatives Anaphylaxis   . Nadolol Hives 08/02/2009  . Other Other (See Comments) 06/22/2011  . Verapamil Other (See Comments)     Family History  Problem Relation Age of Onset  . Heart attack Father 63    died  . Heart disease Father     massive heart attack  . Coronary artery disease Mother 88    had bypass [place]  . Heart disease Mother   . Anesthesia problems Neg Hx   . Hypotension Neg Hx   . Malignant hyperthermia Neg Hx   . Pseudochol deficiency Neg Hx   . Colon cancer Neg Hx   . Esophageal cancer Neg Hx   . Stomach cancer Neg Hx   . Rectal cancer Neg Hx     History   Social History  . Marital Status: Married    Spouse Name: N/A    Number of Children: 1  . Years of Education: N/A   Occupational History  . LPN    Social History Main Topics  . Smoking status: Never Smoker   . Smokeless tobacco: Never Used  . Alcohol Use: No  . Drug Use: No  . Sexually Active: Yes    Birth Control/ Protection: Post-menopausal   Other Topics Concern  . Not on file   Social History Narrative  . No narrative on file      Physical Exam: BP 104/70  Pulse 64  Ht 5' 5.5" (1.664 m)  Wt 140 lb 8 oz (63.73 kg)  BMI 23.02 kg/m2 Constitutional: generally  well-appearing Psychiatric: alert and oriented x3 Abdomen: soft, nontender, nondistended, no obvious ascites, no peritoneal signs, normal bowel sounds     Assessment and plan: 71 y.o. female with improved anastomotic stricture, bloating, constipation  the stricture has been completely dilated and as of one month ago looked great. The colon just proximal to 2 this was slightly edematous and may have been prolapsing through the stricture at times. I'm hoping that that we'll eventually improved, resolved. I want her to stay on fiber supplements and she will add Gas-X with every meal. She will call to report on her symptoms in 3-4 weeks.

## 2012-01-26 ENCOUNTER — Ambulatory Visit: Payer: Medicare Other | Admitting: Gastroenterology

## 2012-01-29 ENCOUNTER — Telehealth (INDEPENDENT_AMBULATORY_CARE_PROVIDER_SITE_OTHER): Payer: Self-pay | Admitting: General Surgery

## 2012-01-29 ENCOUNTER — Encounter (INDEPENDENT_AMBULATORY_CARE_PROVIDER_SITE_OTHER): Payer: Self-pay

## 2012-01-29 NOTE — Telephone Encounter (Signed)
Per last telephone note patient was to follow up after she saw Dr Ardis Hughs. Appt made with patient for 02/07/12.

## 2012-01-29 NOTE — Telephone Encounter (Signed)
Pt wanted to let you know that she is done with Dr Ardis Hughs and last day with Dr Ardis Hughs was on 9-12. She wants to know that if Dr [name] wanted her to come back for a appt with him, you can call her at home

## 2012-02-07 ENCOUNTER — Encounter (INDEPENDENT_AMBULATORY_CARE_PROVIDER_SITE_OTHER): Payer: Self-pay | Admitting: General Surgery

## 2012-02-07 ENCOUNTER — Ambulatory Visit (INDEPENDENT_AMBULATORY_CARE_PROVIDER_SITE_OTHER): Payer: Medicare Other | Admitting: General Surgery

## 2012-02-07 VITALS — BP 132/78 | HR 70 | Temp 97.1°F | Resp 14 | Ht 65.5 in | Wt 140.0 lb

## 2012-02-07 DIAGNOSIS — K913 Postprocedural intestinal obstruction, unspecified as to partial versus complete: Secondary | ICD-10-CM

## 2012-02-07 DIAGNOSIS — K56609 Unspecified intestinal obstruction, unspecified as to partial versus complete obstruction: Secondary | ICD-10-CM

## 2012-02-07 NOTE — Patient Instructions (Signed)
Keep up with the bowel regimen. Call if symptoms worsen

## 2012-02-07 NOTE — Progress Notes (Signed)
Subjective:     Patient ID: [name], female   DOB: 1940/09/30, 71 y.o.   MRN: [id number]  HPI  71 year old Caucasian female comes in for followup. She had undergone a colostomy reversal in February 2013. I last saw her in mid April when she was complaining of intermittent left mid abdominal pain as well as bloating and change in her bowel habits. It seemed to consistent with a possible anastomotic stricture. She was referred to Dr. Ardis Hughs who performed a colonoscopy. Initially he was unable to pass the scope through the anastomosis. However he was able to dilate it. Over the summer she has undergone several dilations. On her most recent colonoscopy on August 13, she underwent a full colonoscopy. He was able to easily pass the scope. He did notice that the proximal colon was a little edematous. She states that she has had significant improvement since the last time I saw her. She denies any fever or chills. She denies any nausea or vomiting. She reports almost daily bowel movements. There may be a day where she may go without a bowel movement but that is infrequent. She will have some occasional left-sided abdominal pain that lasts for a few seconds. However she states it is not that bad and not that frequent. The bloating has significantly improved. She denies any melena or hematochezia. She denies any diarrhea.  PMHx, PSHx, SOCHx, FAMHx, ALL reviewed   Review of Systems 8 point ROS negative except for As above    Objective:   Physical Exam BP 132/78  Pulse 70  Temp 97.1 F (36.2 C) (Temporal)  Resp 14  Ht 5' 5.5" (1.664 m)  Wt 140 lb (63.504 kg)  BMI 22.94 kg/m2  Gen: alert, NAD, non-toxic appearing Pupils: equal, no scleral icterus Pulm: Lungs clear to auscultation, symmetric chest rise CV: regular rate and rhythm Abd: soft, nontender, nondistended. Well-healed midline incision. No cellulitis. No incisional hernia Ext: no edema, no calf tenderness Skin: no rash, no jaundice     Assessment:     S/p colostomy reversal for undesired colostomy and h/o perforated C diff colitis with anastomotic stricture s/p successful balloon dilation    Plan:     I'm glad her symptoms have significantly improved. She states that she is almost back to baseline. I encouraged her to continue with fiber supplementation. She states that she would like to try fiber pills instead of the fiber powder. I explained that most people do better with fiber powder but I do not see any problem with her giving fiber pills a try. Since she is relatively asymptomatic, I recommended continued observation for now. I believe the likelihood of recurrent narrowing is low. I encouraged to let me know if symptoms worsen.   [name]. [name], MD, FACS General, Bariatric, & Minimally Invasive Surgery [facility], [place]

## 2012-02-15 ENCOUNTER — Telehealth: Payer: Self-pay | Admitting: Gastroenterology

## 2012-02-16 NOTE — Telephone Encounter (Signed)
Message forwarded to Dr Ardis Hughs

## 2012-02-19 NOTE — Telephone Encounter (Signed)
Great to hear!

## 2012-03-26 ENCOUNTER — Other Ambulatory Visit: Payer: Self-pay | Admitting: Cardiology

## 2012-03-26 IMAGING — CR DG ABDOMEN 2V
4 series · 4 of 4 positions shown · non-contrast
Comparison: 11/29/2010

CLINICAL DATA: Abdominal pain and diarrhea.  Postop from CABG.

ABDOMEN - 2 VIEW

[w abdomen upright (1 of 2)]
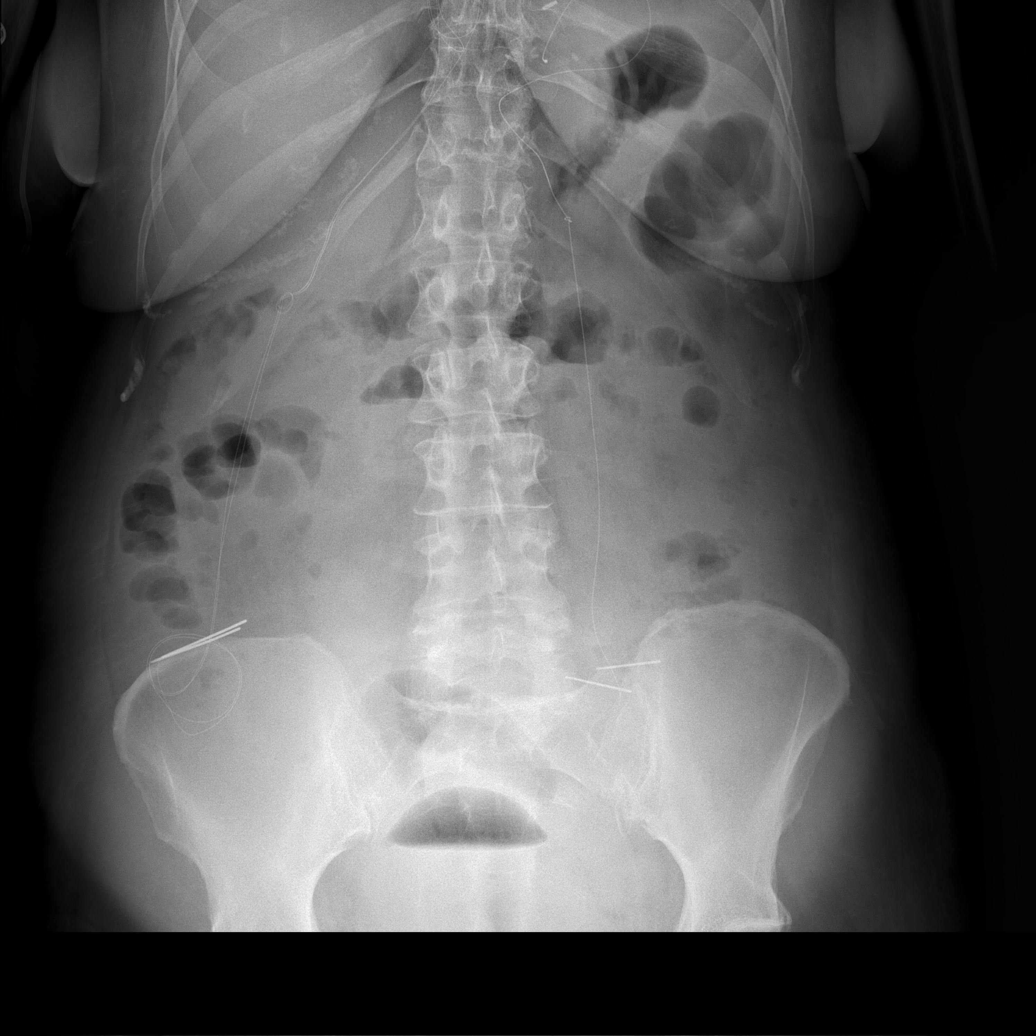

[w abdomen upright (2 of 2)]
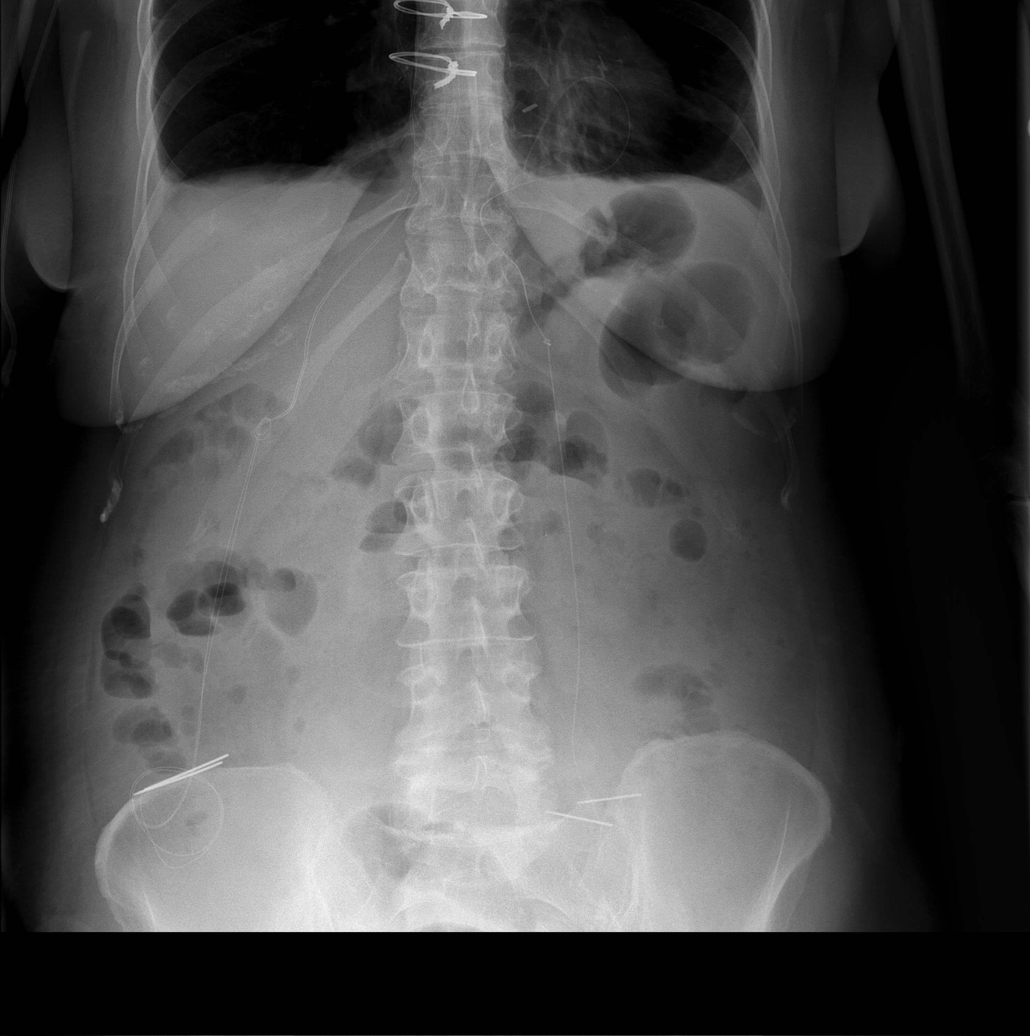

[t abdomen supine (1 of 2)]
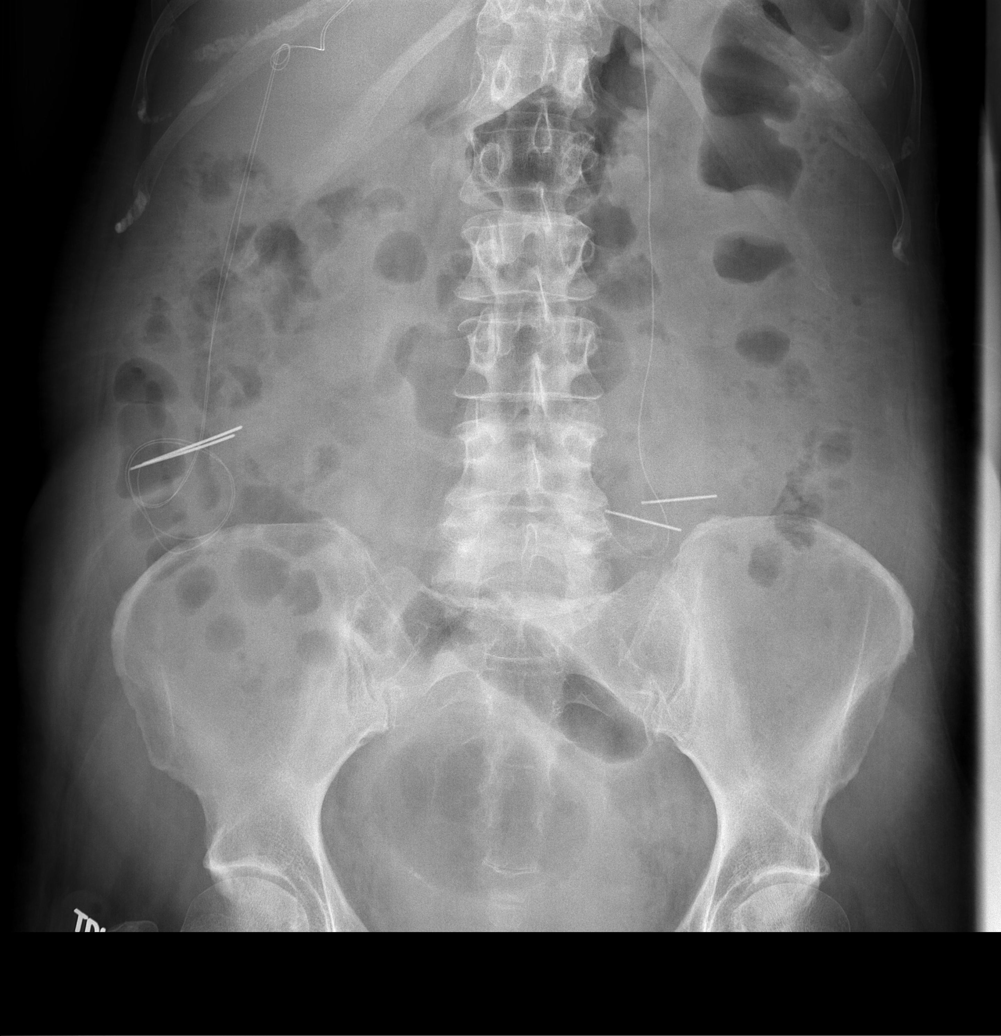

[t abdomen supine (2 of 2)]
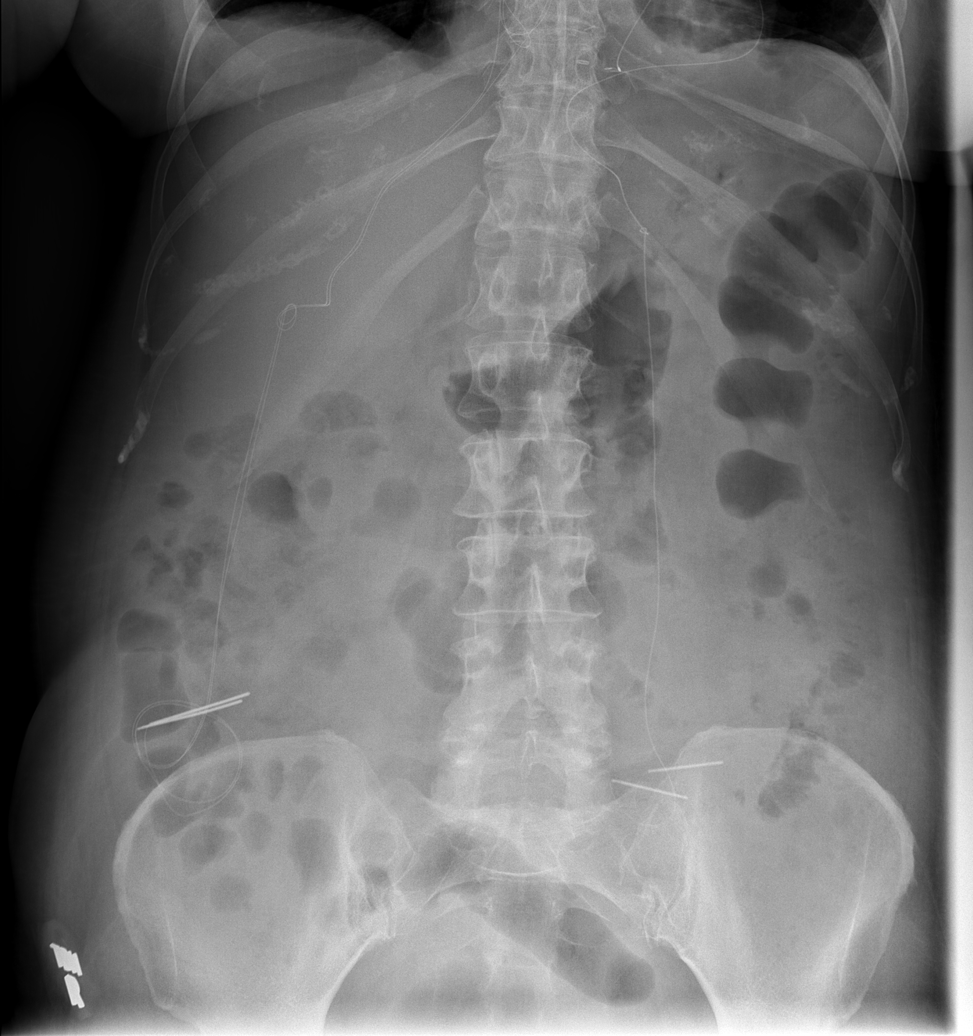

[4 of 4 positions shown; findings below may reference images not displayed]

FINDINGS: Scattered air fluid levels are seen within nondilated
small bowel loops and colon, without significant change since prior
exam.  No evidence of free air.  No radiopaque calculi identified.
The tiny bilateral pleural effusions again seen.
IMPRESSION: Nonspecific, nonobstructive bowel gas pattern.

## 2012-04-08 IMAGING — CR DG CHEST 2V
2 series · 2 of 2 positions shown · non-contrast
Comparison: 12/03/2010

CLINICAL DATA: Status post CABG.  Lower extremity edema. Shortness
of breath.  Abdominal distension.  Status post heart surgery.

CHEST - 2 VIEW

[w chest lat]
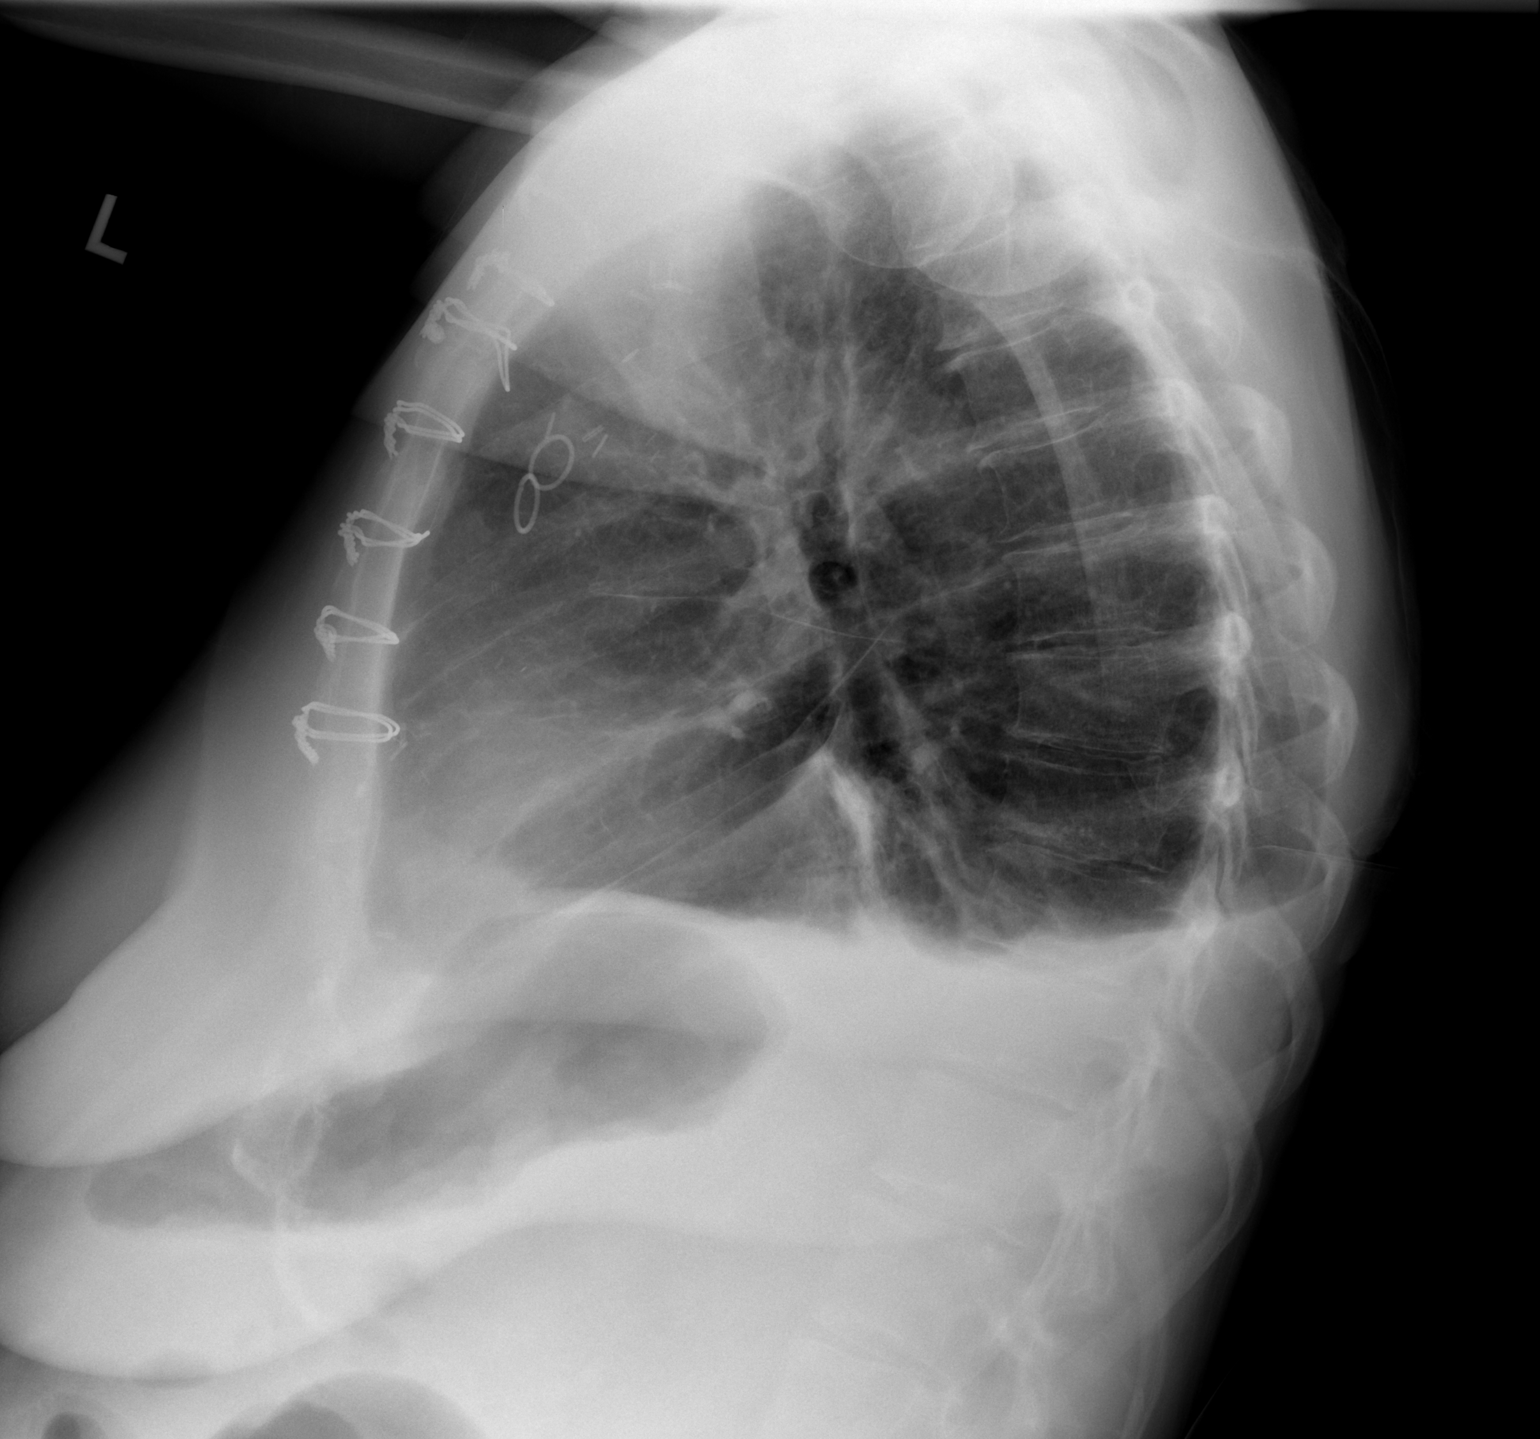

[w chest ap]
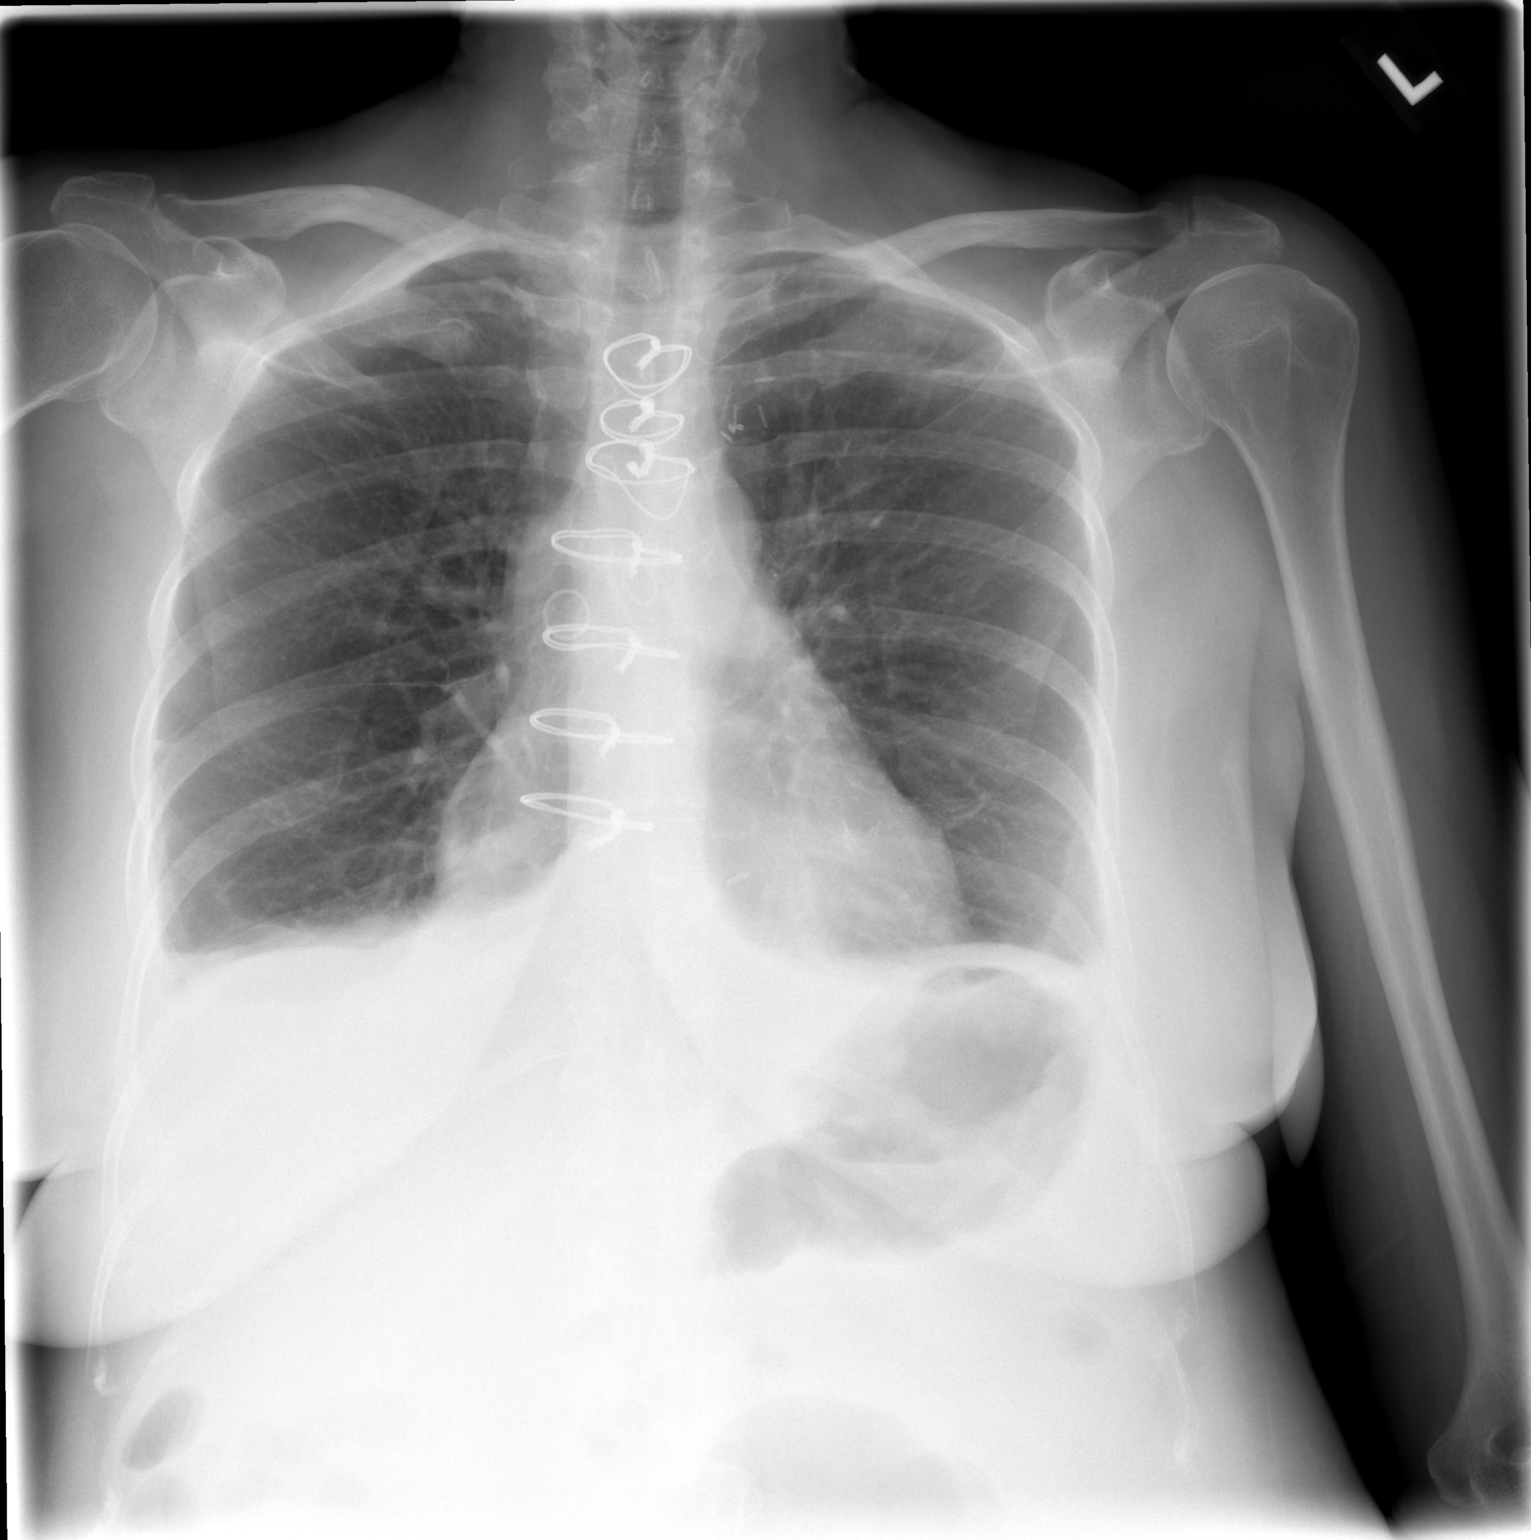

[2 of 2 positions shown; findings below may reference images not displayed]

FINDINGS: The patient has had median sternotomy and CABG.  Right
PICC line, IJ line have been removed.  There are small bilateral
pleural effusions.  No evidence for pneumothorax.  Heart size is
mildly enlarged.  No pulmonary edema. Mild mid thoracic
degenerative changes are seen.
IMPRESSION: 1.  Small bilateral pleural effusions.
2.  No pulmonary edema or pneumothorax.

## 2012-04-09 IMAGING — CR DG ABDOMEN 2V
2 series · 2 of 2 positions shown · non-contrast
Comparison: 12/16/2010

CLINICAL DATA: Diarrhea, blood in stools.

ABDOMEN - 2 VIEW

[w abdomen upright]
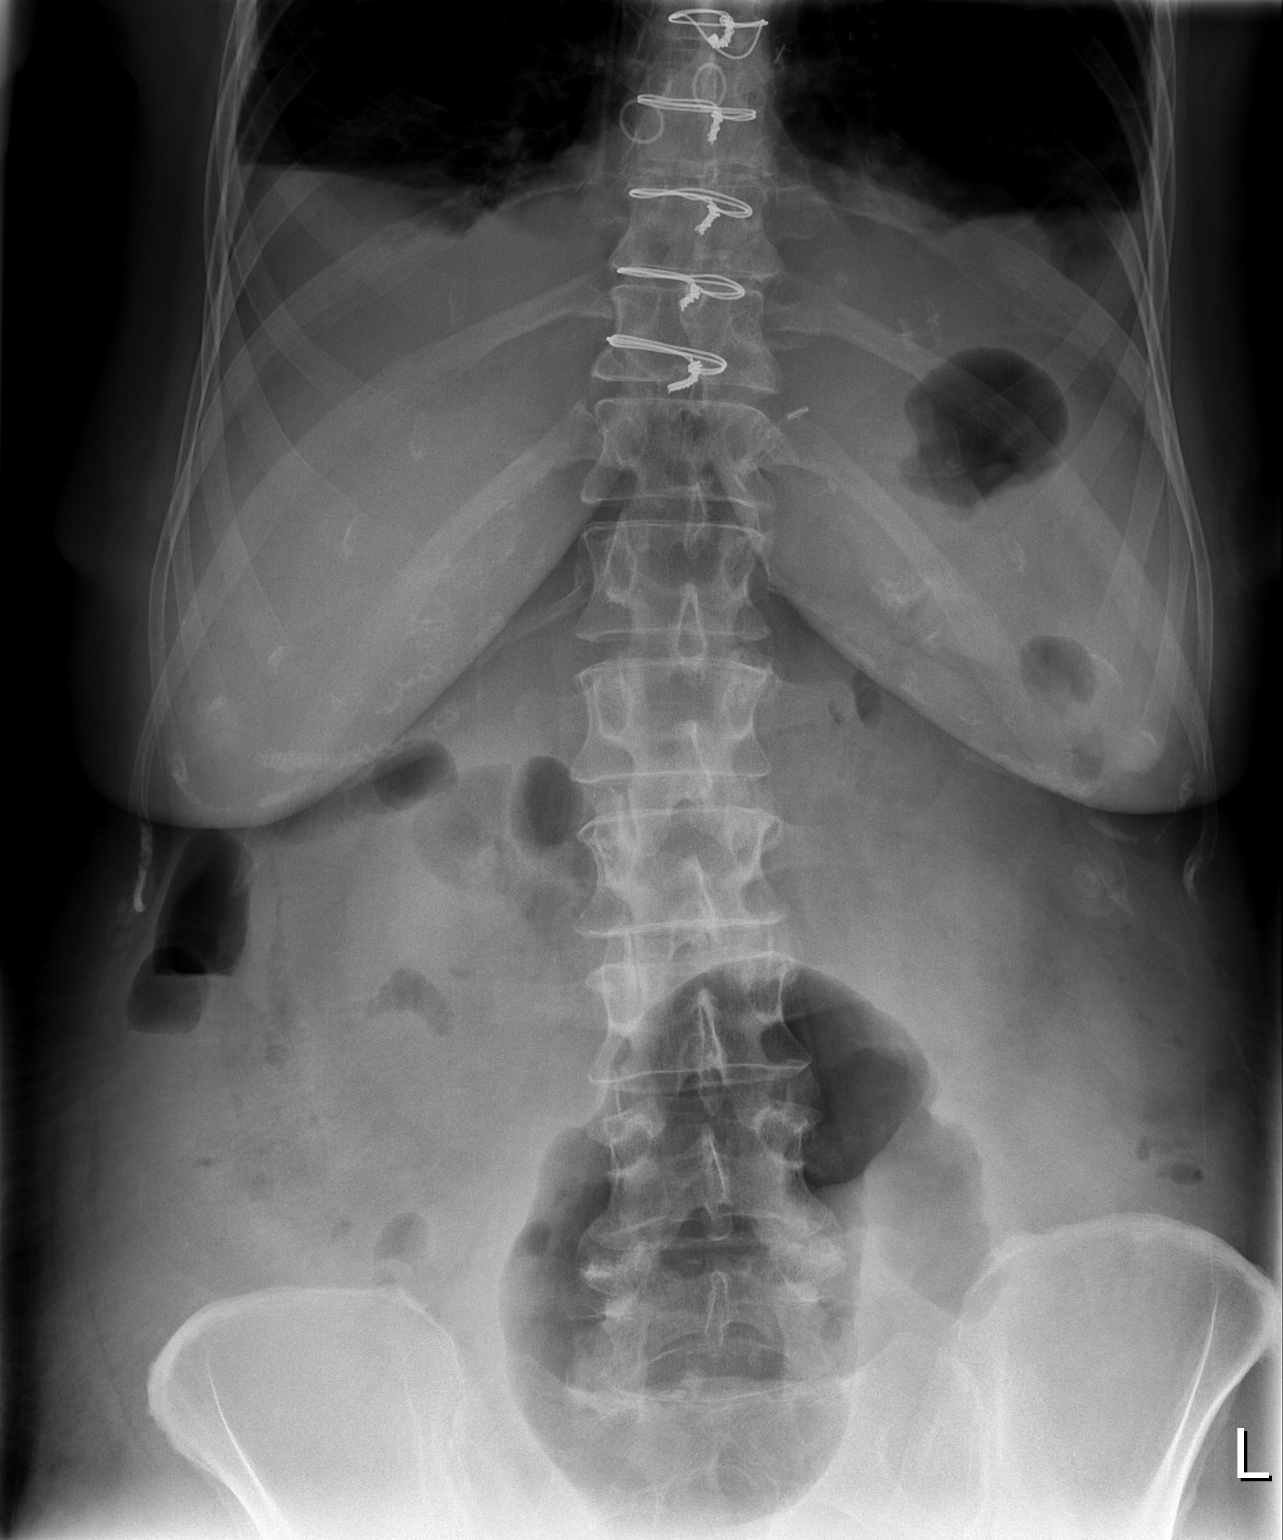

[t abdomen supine]
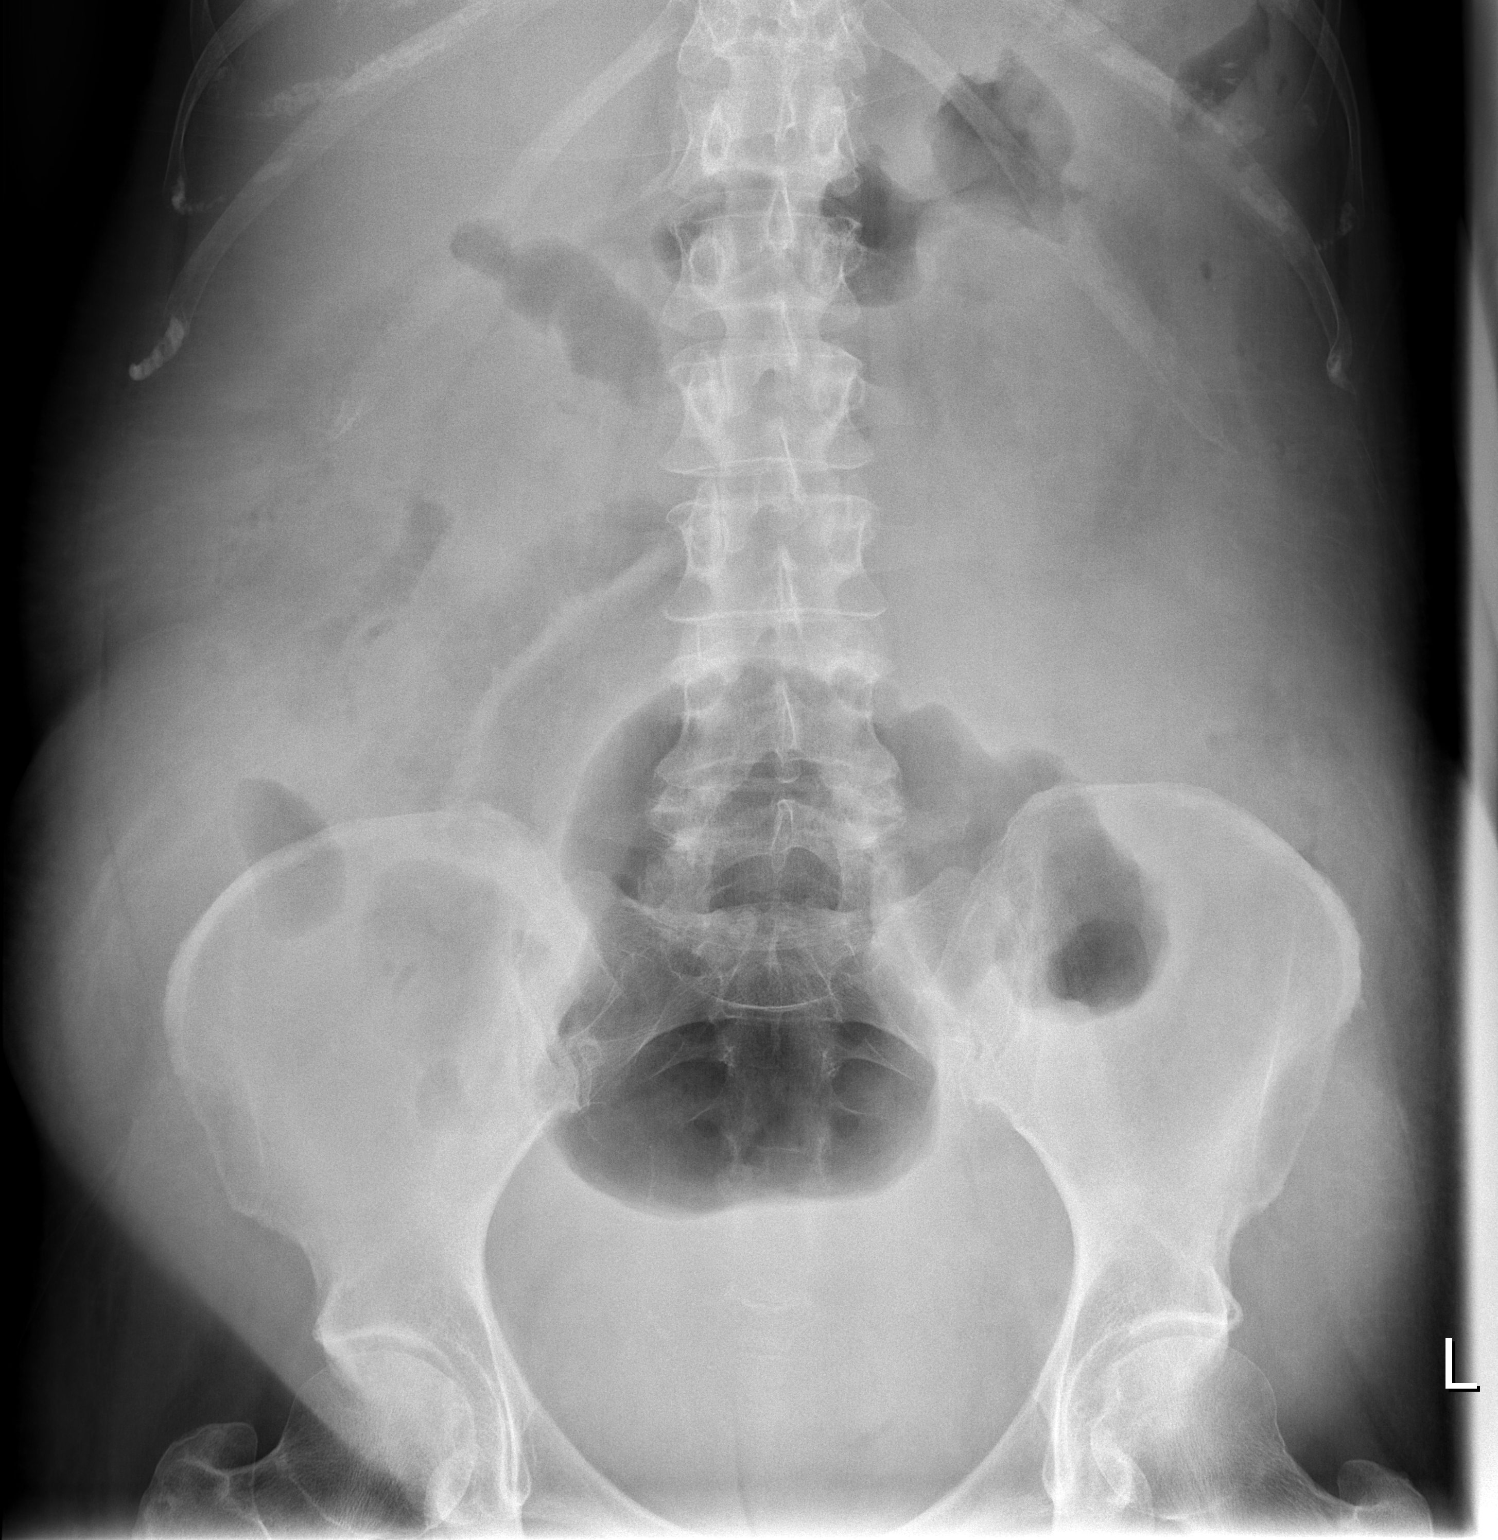

[2 of 2 positions shown; findings below may reference images not displayed]

FINDINGS: Patient has had median sternotomy CABG.  There are
bilateral pleural effusions.  No free intraperitoneal air beneath
the diaphragm.  Supine and erect views of the abdomen show overall
increased density, raising the question of ascites.  In addition,
there is nodular appearance of the colonic wall which can be seen
colitis.  Small bowel loops appear nondilated.
IMPRESSION: 1.  Bilateral pleural effusions, bibasilar opacities.
2.  Question of colitis.
3.  Question of ascites.

## 2012-05-21 ENCOUNTER — Encounter: Payer: Self-pay | Admitting: Cardiology

## 2012-05-21 ENCOUNTER — Ambulatory Visit (INDEPENDENT_AMBULATORY_CARE_PROVIDER_SITE_OTHER): Payer: Medicare Other | Admitting: Cardiology

## 2012-05-21 VITALS — BP 112/76 | HR 50 | Ht 65.5 in | Wt 141.0 lb

## 2012-05-21 DIAGNOSIS — I4891 Unspecified atrial fibrillation: Secondary | ICD-10-CM

## 2012-05-21 DIAGNOSIS — E785 Hyperlipidemia, unspecified: Secondary | ICD-10-CM

## 2012-05-21 DIAGNOSIS — I2510 Atherosclerotic heart disease of native coronary artery without angina pectoris: Secondary | ICD-10-CM

## 2012-05-21 LAB — LIPID PANEL
Cholesterol: 185 mg/dL (ref 0–200)
LDL Cholesterol: 96 mg/dL (ref 0–99)
Total CHOL/HDL Ratio: 3
VLDL: 20.2 mg/dL (ref 0.0–40.0)

## 2012-05-21 LAB — BASIC METABOLIC PANEL
Chloride: 106 mEq/L (ref 96–112)
GFR: 65.8 mL/min (ref >60.00)
Potassium: 4.1 mEq/L (ref 3.5–5.1)
Sodium: 141 mEq/L (ref 135–145)

## 2012-05-21 LAB — HEPATIC FUNCTION PANEL
ALT: 21 U/L (ref 0–35)
AST: 21 U/L (ref 0–37)
Alkaline Phosphatase: 41 U/L (ref 39–117)
Bilirubin, Direct: 0.1 mg/dL (ref 0.0–0.3)
Total Bilirubin: 0.9 mg/dL (ref 0.3–1.2)

## 2012-05-21 MED ORDER — PRAVASTATIN SODIUM 40 MG PO TABS: 40.0000 mg | ORAL_TABLET | Freq: Every day | ORAL | Status: DC

## 2012-05-21 MED ORDER — METOPROLOL SUCCINATE ER 25 MG PO TB24: 25.0000 mg | ORAL_TABLET | Freq: Every day | ORAL | Status: DC

## 2012-05-21 NOTE — Progress Notes (Signed)
Patient ID: [name], female   DOB: 01/29/1941, 72 y.o.   MRN: [id number] PCP: Dr. Dena Terry  72 yo presented to [facility] in 7/12 with chest pain.  She ruled in for NSTEMI.  Cardiac catheterization 7/12: Proximal LAD 30%, mid LAD 60-70%, distal LAD 80%, ostial OM2 50%, mid OM2 40%, 80% at the trifurcation, proximal RCA 25% then occluded with left to right collaterals, EF 55%.  Echocardiogram 7/12: EF 60-65%, PASP 45.  She was referred for bypass surgery.  Grafts included a LIMA-LAD, SVG-D1, SVG-PDA.  Of note, she did have an endarterectomy of the OM.  She developed postoperative atrial fibrillation and was treated with Coumadin and amiodarone.  She maintained normal sinus rhythm with this.  Unfortunately, she developed C. Difficile colitis.  She improved and was eventually discharged home in 7/12.  She was readmitted 8/3-8/18 with failure to thrive, anasarca and profound diarrhea.  She was placed on IV vancomycin.  She was diuresed for anasarca.  Relook echocardiogram demonstrated normal LV function.  CT scan demonstrated a perforated sigmoid colon and she was referred for exploratory laparotomy.  She underwent sigmoid colectomy with end colostomy [name] procedure) by Dr. Greer Terry.  She had her colostomy reversed in 2/13.    [name] is doing well.  No chest pain.  No dyspnea.  She is walking most days for exercise with no problems.  No orthostatic symptoms.  Less palpitations.  Event monitor in 3/13 showed no significant arrhythmias.   Labs (9/12): K 4.7, creatinine 0.6, LFTs normal, HCT 33.2 Labs (3/13): LDL 82, HDL 52, K 4.1, creatinine 0.8, LDL 74, HDL 66   Past Medical History  Diagnosis Date  . Coronary artery disease     a. NSTEMI 7/12, cath: pLAD 30%, mLAD 60-70%, dLAD 80%, oOM2 50%, mOM2 40%, trifurcation 80%, pRCA 25%, then occluded, L-R collats, EF 55%;   b. 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 Roxy Terry)  . Hyperlipidemia     takes Pravastatin daily  .  Pericarditis 1980    hx of  . GERD (gastroesophageal reflux disease)   . Palpitations 2003    hx of; Holter in 2003 with PACs and PVCs  . Atrial fibrillation     post-op after CABG;  treated with Amiodarone and coumadin until readmxn with CDiff colitis, perf colon and elevated LFTs (amio and coumadin stopped)  . C. difficile colitis 11/8293    complicated post CABG course with prolonged admission, perforated sigmoid colon;  s/p sigmoid colectomy with end colostomy [name] procedure);  laparotomy wound infection with Pseudomonas treated with antibxs and secondary intention  . 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 Surgical wound infection     Pseudomonas; wound VAC; secondary intention  . 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 Hair loss     after heart surgery  . Hypertension     takes Metoprolol daily  . Mitral valve prolapse     was told this in the late 1990's; ;then with Dr.Zannie Terry he states that she doesn't  . Arthritis     neck and back  . PONV (postoperative nausea and vomiting)     w/appy  . Complication of anesthesia 12/2010    "had trouble waking me up after colon OR"  . Myocardial infarction 11/2010  . History of bronchitis     late 1980's  . Anemia     "in my  early teens"  . H/O hiatal hernia     "slight; not having problems with it"  . Seizures     "w/verapimil"  . Hemorrhoids     "none since hemorrhoid OR"  . Chronic kidney disease     hx kidney stones    Current Outpatient Prescriptions  Medication Sig Dispense Refill  . aspirin 81 MG chewable tablet Chew 81 mg by mouth daily.       . Bacillus Coagulans-Inulin (PROBIOTIC-PREBIOTIC) 1-250 BILLION-MG CAPS Take 1 capsule by mouth daily.       . Calcium Carbonate-Vitamin D 600-400 MG-UNIT per tablet Take 1 tablet by mouth daily.       . Coenzyme Q10 200 MG TABS Take 1 tablet by mouth daily.     0  . methylcellulose (CITRUCEL) oral powder Take by mouth daily.      . metoprolol  succinate (TOPROL-XL) 25 MG 24 hr tablet Taking half tab a day      . metoprolol succinate (TOPROL-XL) 25 MG 24 hr tablet TAKE ONE TABLET BY MOUTH EVERY DAY  30 tablet  4  . Multiple Vitamin (MULTIVITAMIN) tablet Take 1 tablet by mouth daily.       . pravastatin (PRAVACHOL) 40 MG tablet Take 1 tablet (40 mg total) by mouth every evening.  30 tablet  11  . vitamin C (ASCORBIC ACID) 500 MG tablet Take 500 mg by mouth daily.         Allergies: Allergies  Allergen Reactions  . Sulfa Antibiotics Hives  . Sulfonamide Derivatives Anaphylaxis  . Nadolol Hives    Rash and nausea  . Other Other (See Comments)    Apples-anaphylaxis  . Verapamil Other (See Comments)    seizures   SH: Lives with husband in [place], retired Therapist, sports.  Nonsmoker.   Vital Signs: BP 112/76  Pulse 50  Ht 5' 5.5" (1.664 m)  Wt 141 lb (63.957 kg)  BMI 23.11 kg/m2  PHYSICAL EXAM: General: Well nourished, well developed, in no acute distress HEENT: normal Neck: no JVD At 90 Cardiac:  normal S1, S2; RRR; No murmur.  Lungs:  clear to auscultation bilaterally, no wheezing, rhonchi or rales VOP:FYTW, nontender, no hepatosplenomegaly Ext: no edema Skin: warm and dry Psych: Normal affect  Assesssment/Plan:  Atrial fibrillation Patient had post-operative atrial fibrillation with no documented recurrence. She is in sinus rhythm today. 3-week monitor in 3/13 showed no atrial fibrillation.  Plan anticoagulation if recurrent atrial fibrillation is noted.  CAD  Status post CABG. EF preserved. She is on ASA 81, pravastatin, and Toprol XL.  Doing well overall with no ischemic symptoms.  HYPERLIPIDEMIA-MIXED Patient unable to tolerate Crestor due to leg weakness. She has done well so far with pravastatin. Checking lipids today.   [name] 05/21/2012 8:34 AM

## 2012-05-21 NOTE — Addendum Note (Signed)
Addended by: Eulis Foster on: 05/21/2012 08:52 AM   Modules accepted: Orders

## 2012-05-21 NOTE — Patient Instructions (Addendum)
Your physician recommends that you have  lab work today-BMET/Lipid profile today.  Your physician wants you to follow-up in: 1 year with Dr Aundra Dubin. (January 2015).  You will receive a reminder letter in the mail two months in advance. If you don't receive a letter, please call our office to schedule the follow-up appointment.

## 2012-06-29 ENCOUNTER — Other Ambulatory Visit: Payer: Self-pay

## 2012-12-02 ENCOUNTER — Encounter: Payer: Self-pay | Admitting: Nurse Practitioner

## 2012-12-02 ENCOUNTER — Telehealth: Payer: Self-pay | Admitting: Gastroenterology

## 2012-12-02 ENCOUNTER — Ambulatory Visit (INDEPENDENT_AMBULATORY_CARE_PROVIDER_SITE_OTHER): Payer: Medicare Other | Admitting: Nurse Practitioner

## 2012-12-02 VITALS — BP 138/60 | HR 72 | Ht 65.5 in | Wt 146.4 lb

## 2012-12-02 DIAGNOSIS — K56609 Unspecified intestinal obstruction, unspecified as to partial versus complete obstruction: Secondary | ICD-10-CM

## 2012-12-02 DIAGNOSIS — K56699 Other intestinal obstruction unspecified as to partial versus complete obstruction: Secondary | ICD-10-CM

## 2012-12-02 DIAGNOSIS — R197 Diarrhea, unspecified: Secondary | ICD-10-CM | POA: Insufficient documentation

## 2012-12-02 NOTE — Progress Notes (Signed)
  History of Present Illness:  Patient is a 72 year old female with a history of c-diff colitis resulting in a perforated sigmoid. She is s/p segmental colectomy followed by reversal 2/13. In June 2013 patient presented to our office with left lower quadrant pain and loose stool. Flexible sigmoidoscopy revealed a tight anastomotic stricture requiring dilation on 3 different occasions. Following that patient had a screening colonoscopy. Findings included sigmoid anastomotic stricture which was able to be traversed with the scope. Stricture was dilated again using a 28m balloon.   Patient presents with a two-week history of diarrhea. She does not feel like this is recurrent C. difficile, stools are not malodorous. She 7-8 loose nonbloody bowel movements a day. No fever. She has left lower quadrant cramping and bloating, both better after defecation. Patient has not started any new medications lately. She's not made any recent dietary changes.   Current Medications, Allergies, Past Medical History, Past Surgical History, Family History and Social History were reviewed in [employer].  Physical Exam: General: Well developed , white female in no acute distress Head: Normocephalic and atraumatic Eyes:  sclerae anicteric, conjunctiva pink  Ears: Normal auditory acuity Lungs: Clear throughout to auscultation Heart: Regular rate and rhythm Abdomen: Soft, non distended, non-tender. No masses, no hepatomegaly. Normal bowel sounds Musculoskeletal: Symmetrical with no gross deformities  Extremities: No edema  Neurological: Alert oriented x 4, grossly nonfocal Psychological:  Alert and cooperative. Normal mood and affect  Assessment and Recommendations: [age]year old female with history of sigmoid perforation secondary to C. difficile colitis last year . Patient had a colostomy followed by reversal in February 2013 . She has a history of anastomotic strictures requiring  dilation, last one August 2013   2. Left lower quadrant discomfort and loose stool. Last time patient presented with these symptoms she had an anastomotic stricture. Doubt C. difficile colitis but certainly need to rule this out . Patient will be scheduled for flexible sigmoidoscopy with possible dilation of an anastomotic stricture . The risks, benefits, and alternatives to colonoscopy with possible biopsy and possible dilation  were discussed with the patient and she consents to proceed.  3. coronary artery disease, status post CABG July 2012.

## 2012-12-02 NOTE — Patient Instructions (Addendum)
You have been scheduled for a flexible sigmoidoscopy. Please follow the written instructions given to you at your visit today. If you use inhalers (even only as needed), please bring them with you on the day of your procedure. Your physician has requested that you go to the basement for the following lab work before leaving today: BMET and stool test CC:  Lovette Cliche MD

## 2012-12-02 NOTE — Telephone Encounter (Signed)
Cramping and diarrhea for 2 weeks, no recent antibiotics.  Pt has been around a church member with diarrhea.  No fever.  No blood or mucous.  Dark brown or black stool.  Appt scheduled with Nevin Bloodgood for 130 pm this afternoon.

## 2012-12-03 ENCOUNTER — Other Ambulatory Visit: Payer: Medicare Other

## 2012-12-03 ENCOUNTER — Encounter: Payer: Self-pay | Admitting: Nurse Practitioner

## 2012-12-03 DIAGNOSIS — K56699 Other intestinal obstruction unspecified as to partial versus complete obstruction: Secondary | ICD-10-CM

## 2012-12-03 DIAGNOSIS — R197 Diarrhea, unspecified: Secondary | ICD-10-CM

## 2012-12-04 LAB — CLOSTRIDIUM DIFFICILE BY PCR: Toxigenic C. Difficile by PCR: NOT DETECTED

## 2012-12-04 LAB — FECAL LACTOFERRIN, QUANT: Lactoferrin: POSITIVE

## 2012-12-08 NOTE — Progress Notes (Signed)
i agree with the plan in this note

## 2012-12-10 ENCOUNTER — Ambulatory Visit (AMBULATORY_SURGERY_CENTER): Payer: Medicare Other | Admitting: Gastroenterology

## 2012-12-10 ENCOUNTER — Encounter: Payer: Self-pay | Admitting: Gastroenterology

## 2012-12-10 ENCOUNTER — Telehealth: Payer: Self-pay | Admitting: Gastroenterology

## 2012-12-10 VITALS — BP 113/74 | HR 53 | Temp 97.7°F | Resp 20 | Ht 65.5 in | Wt 146.0 lb

## 2012-12-10 DIAGNOSIS — R197 Diarrhea, unspecified: Secondary | ICD-10-CM

## 2012-12-10 DIAGNOSIS — R933 Abnormal findings on diagnostic imaging of other parts of digestive tract: Secondary | ICD-10-CM

## 2012-12-10 DIAGNOSIS — K5289 Other specified noninfective gastroenteritis and colitis: Secondary | ICD-10-CM

## 2012-12-10 MED ORDER — SODIUM CHLORIDE 0.9 % IV SOLN: 500.0000 mL | INTRAVENOUS | Status: DC

## 2012-12-10 NOTE — Op Note (Signed)
[facility]  [address] [place], [address]   FLEX SIGMOIDOSCOPY PROCEDURE REPORT PATIENT: [name], [name]  MR#: [id number] BIRTHDATE: 08-Apr-1941 , 72  yrs. old GENDER: Female ENDOSCOPIST: Milus Banister, MD PROCEDURE DATE:  12/10/2012 PROCEDURE:   Sigmoidoscopy with biopsy and Sigmoidoscopy with balloon dilation INDICATIONS:1.  C.  Diff coltis, eventually perforated sigmoid, s/p segmental colectomy, reversed 2/13. 2.  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  She underwent 3-4 sequential sigmoidoscopies with dilation of the stricture.  Eventually dilated up to 20 mm with very good results in her diarrhea.  Last examination was August 2013, this was a full colonoscopy.  No polyps were seen.  She did have proximal to the site of the previous stricture, the colon was somewhat edematous.Marland Kitchen MEDICATIONS: Fentanyl 50 mcg IV, Versed 3 mg IV, and These medications were titrated to patient response per physician's verbal order  DESCRIPTION OF PROCEDURE:    Physical exam was performed.  Informed consent was obtained from the patient after explaining the benefits, risks, and alternatives to procedure.  The patient was connected to monitor and placed in left lateral position. Continuous oxygen was provided by nasal cannula and IV medicine administered through an indwelling cannula.  After administration of sedation and rectal exam, the patients rectum was intubated and the LB 180 Scope [id number]  colonoscope was advanced under direct visualization to the splenic flexure.  The scope was removed slowly by carefully examining the color, texture, anatomy, and integrity mucosa on the way out.  The patient was recovered in endoscopy and discharged home in satisfactory condition.  COLON FINDINGS: The examined colon (to the splenic flexure) was mildly erythematous throughout but not frankly inflammed.  This was biopsied and sent to  pathology.  The rectosigmoid anastomosis was very easy to traverse, lumen probably 1.7cm across.  I dilated this up to 2cm with CRE TTS balloon held inflated for 1 minute.  The examination was otherwise normal. PREP QUALITY: good CECAL W/D TIME: NA COMPLICATIONS: None  ENDOSCOPIC IMPRESSION: The examined colon (to the splenic flexure) was mildly erythematous throughout but not frankly inflammed.  This was biopsied and sent to pathology.  The rectosigmoid anastomosis was very easy to traverse, lumen probably 1.7cm across.  I dilated this up to 2cm with CRE TTS balloon held inflated for 1 minute.  The examination was otherwise normal.  RECOMMENDATIONS: Await final pathology.  If inflammation is noted, will start mesalamine or low dose steroids.  Also will need to see how you respond to the dilation today.  _____________________________ eSigned:  Milus Banister, MD 12/10/2012 10:55 AM

## 2012-12-10 NOTE — Progress Notes (Signed)
Patient did not have preoperative order for IV antibiotic SSI prophylaxis. (G8918)  Patient did not experience any of the following events: a burn prior to discharge; a fall within the facility; wrong site/side/patient/procedure/implant event; or a hospital transfer or hospital admission upon discharge from the facility. (G8907)  

## 2012-12-10 NOTE — Patient Instructions (Addendum)
YOU HAD AN ENDOSCOPIC PROCEDURE TODAY AT [facility]: Refer to the procedure report that was given to you for any specific questions about what was found during the examination.  If the procedure report does not answer your questions, please call your gastroenterologist to clarify.  If you requested that your care partner not be given the details of your procedure findings, then the procedure report has been included in a sealed envelope for you to review at your convenience later.  YOU SHOULD EXPECT: Some feelings of bloating in the abdomen. Passage of more gas than usual.  Walking can help get rid of the air that was put into your GI tract during the procedure and reduce the bloating. If you had a lower endoscopy (such as a colonoscopy or flexible sigmoidoscopy) you may notice spotting of blood in your stool or on the toilet paper. If you underwent a bowel prep for your procedure, then you may not have a normal bowel movement for a few days.  DIET: Your first meal following the procedure should be a light meal and then it is ok to progress to your normal diet.  A half-sandwich or bowl of soup is an example of a good first meal.  Heavy or fried foods are harder to digest and may make you feel nauseous or bloated.  Likewise meals heavy in dairy and vegetables can cause extra gas to form and this can also increase the bloating.  Drink plenty of fluids but you should avoid alcoholic beverages for 24 hours.  ACTIVITY: Your care partner should take you home directly after the procedure.  You should plan to take it easy, moving slowly for the rest of the day.  You can resume normal activity the day after the procedure however you should NOT DRIVE or use heavy machinery for 24 hours (because of the sedation medicines used during the test).    SYMPTOMS TO REPORT IMMEDIATELY: A gastroenterologist can be reached at any hour.  During normal business hours, 8:30 AM to 5:00 PM Monday through Friday,  call [phone].  After hours and on weekends, please call the GI answering service at [phone] who will take a message and have the physician on call contact you.   Following lower endoscopy (colonoscopy or flexible sigmoidoscopy):  Excessive amounts of blood in the stool  Significant tenderness or worsening of abdominal pains  Swelling of the abdomen that is new, acute  Fever of 100F or higher  FOLLOW UP: If any biopsies were taken you will be contacted by phone or by letter within the next 1-3 weeks.  Call your gastroenterologist if you have not heard about the biopsies in 3 weeks.  Our staff will call the home number listed on your records the next business day following your procedure to check on you and address any questions or concerns that you may have at that time regarding the information given to you following your procedure. This is a courtesy call and so if there is no answer at the home number and we have not heard from you through the emergency physician on call, we will assume that you have returned to your regular daily activities without incident.  SIGNATURES/CONFIDENTIALITY: You and/or your care partner have signed paperwork which will be entered into your electronic medical record.  These signatures attest to the fact that that the information above on your After Visit Summary has been reviewed and is understood.  Full responsibility of the confidentiality of this  discharge information lies with you and/or your care-partner.  Recommendations Await final biopsy results If inflammation is noted, will start mesalamine or low dose steroids. Also, will need to see how you respond to the dilation today.

## 2012-12-10 NOTE — Telephone Encounter (Signed)
Patty, Can you call Idolina's husband later today or tomorrow (she was just here today for flex sig).  He believes he is due for colon cancer screening, thinks his last colonoscopy was about 10 years ago in [place].  Can you try to track down his previous colonoscopy report, any pathology so that we can determine proper timing for screening examination.  Thanks

## 2012-12-10 NOTE — Telephone Encounter (Signed)
I spoke with the pt's husband and he will come in tomorrow and sign a release and provided me the information to get his records for Dr Ardis Hughs review

## 2012-12-11 ENCOUNTER — Telehealth: Payer: Self-pay | Admitting: *Deleted

## 2012-12-11 NOTE — Telephone Encounter (Signed)
  Follow up Call-  Call back number 12/10/2012 12/26/2011 11/20/2011 11/03/2011  Post procedure Call Back phone  # [phone] hm [phone]  Permission to leave phone message Yes Yes Yes Yes     Patient questions:  Do you have a fever, pain , or abdominal swelling? no Pain Score  0 *  Have you tolerated food without any problems? yes  Have you been able to return to your normal activities? yes  Do you have any questions about your discharge instructions: Diet   no Medications  no Follow up visit  no  Do you have questions or concerns about your Care? no  Actions: * If pain score is 4 or above: No action needed, pain <4  C/O some gas pain, still passing gas today; told to drink warm fluids and ambulate

## 2012-12-16 ENCOUNTER — Telehealth: Payer: Self-pay | Admitting: Gastroenterology

## 2012-12-16 NOTE — Telephone Encounter (Signed)
Pt has been notified that the pathology has not been reviewed and as soon received I will call her with further recommendations pt agreed and will continue imodium daily

## 2012-12-18 ENCOUNTER — Other Ambulatory Visit: Payer: Self-pay

## 2012-12-19 ENCOUNTER — Other Ambulatory Visit: Payer: Self-pay

## 2012-12-19 MED ORDER — PREDNISONE 10 MG PO TABS: 10.0000 mg | ORAL_TABLET | Freq: Two times a day (BID) | ORAL | Status: DC

## 2012-12-25 ENCOUNTER — Telehealth: Payer: Self-pay | Admitting: Gastroenterology

## 2012-12-25 NOTE — Telephone Encounter (Signed)
Consult with Alonza Bogus PA pt can decrease prednisone to 10 mg once daily and should call the office on Friday to let us know how she is feeling. She should call if the diarrhea returns as well.  Pt was advised if she worsens overnight or the weekend she should be evaluated by the ER.

## 2012-12-25 NOTE — Telephone Encounter (Signed)
Pt started prednisone 10 mg twice daily  1 week ago and has begun to have hot flashes, palpitations and shortness of breath, fatigue and leg aches.  Diarrhea has gotten much better.  Stools are now formed.  Pt would like to decrease prednisone to once daily.  Please advise

## 2012-12-26 MED ORDER — BUDESONIDE 9 MG PO TB24: 9.0000 mg | ORAL_TABLET | Freq: Every day | ORAL | Status: DC

## 2012-12-26 NOTE — Telephone Encounter (Signed)
Consult with Nicoletta Ba PA she states the pt can stop the prednisone and start a trial of Uceris 9 mg daily.  2 weeks worth given to the pt and she will call if this helps. Samples left at the front desk.   Pt agrees and will keep f/u with Dr Ardis Hughs as scheduled and will call if no better.

## 2013-01-06 ENCOUNTER — Telehealth: Payer: Self-pay | Admitting: Gastroenterology

## 2013-01-06 MED ORDER — BUDESONIDE 9 MG PO TB24: 9.0000 mg | ORAL_TABLET | Freq: Every day | ORAL | Status: DC

## 2013-01-06 NOTE — Telephone Encounter (Signed)
Pt will continue Uceris until her follow up appt with Dr Ardis Hughs on 01/29/13  rx refill sent

## 2013-01-07 ENCOUNTER — Telehealth: Payer: Self-pay | Admitting: Gastroenterology

## 2013-01-07 NOTE — Telephone Encounter (Signed)
Error

## 2013-01-07 NOTE — Telephone Encounter (Signed)
Samples left at the front desk for the pt and a call has been placed to the Uceris rep to find a program for the pt

## 2013-01-10 NOTE — Telephone Encounter (Signed)
Application for assistance has been faxed to [employer], pt is aware to call if she needs more samples before she gets an answer

## 2013-01-29 ENCOUNTER — Ambulatory Visit (INDEPENDENT_AMBULATORY_CARE_PROVIDER_SITE_OTHER): Payer: Medicare Other | Admitting: Gastroenterology

## 2013-01-29 ENCOUNTER — Encounter: Payer: Self-pay | Admitting: Gastroenterology

## 2013-01-29 VITALS — BP 100/60 | HR 55 | Ht 66.5 in | Wt 143.5 lb

## 2013-01-29 DIAGNOSIS — K5190 Ulcerative colitis, unspecified, without complications: Secondary | ICD-10-CM

## 2013-01-29 MED ORDER — MESALAMINE 1.2 G PO TBEC: 4.8000 g | DELAYED_RELEASE_TABLET | Freq: Every day | ORAL | Status: DC

## 2013-01-29 NOTE — Patient Instructions (Addendum)
Start lialda 4 pills once daily (new script was called into your pharmacy). Continue uceris for another 3 weeks, then OK to stop. More samples given. Please return to see Dr. Ardis Hughs in 4-5 weeks, call sooner if needed.

## 2013-01-29 NOTE — Progress Notes (Signed)
Review of pertinent gastrointestinal problems:  1. C. Diff coltis, eventually perforated sigmoid, s/p segmental colectomy, reversed 2/13.  2. 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 She underwent 3-4 sequential sigmoidoscopies with dilation of the stricture. Eventually dilated up to 20 mm with very good results in her diarrhea. Last examination was August 2013, this was a full colonoscopy. No polyps were seen. She did have proximal to the site of the previous stricture, the colon was somewhat edematous.  3. routine risk for colon cancer, next colonoscopy August 2023  4. Chronic colitis, July 2014 colonoscopy (mildly inflamed colon throughout, biopsies confirmed chronic inflammation). This was done for diarrhea.  CC anastomosis at the time was open to 40m, repeat dilation performed.  Pred 233mdaily was started.   Hot flashes, racing heart and so changed to uceris 59m27mer day.   HPI: This is a   very pleasant [age]ar old woman whom I last saw the time of a colonoscopy for 5 weeks ago. See those results summarized above.  Since being on  unceris her Bowels are fine, solid brown 1-2 BMs per day.  She was previously going 7-8 times per day, urgency +.  She is getting samples from her office because it is cost prohibitive for her to purchase it.     Past Medical History  Diagnosis Date  . Coronary artery disease     a. NSTEMI 7/12, cath: pLAD 30%, mLAD 60-70%, dLAD 80%, oOM2 50%, mOM2 40%, trifurcation 80%, pRCA 25%, then occluded, L-R collats, EF 55%;   b. 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 OweRoxy Manns. Hyperlipidemia     takes Pravastatin daily  . Pericarditis 1980    hx of  . GERD (gastroesophageal reflux disease)   . Palpitations 2003    hx of; Holter in 2003 with PACs and PVCs  . Atrial fibrillation     post-op after CABG;  treated with Amiodarone and coumadin until readmxn with CDiff colitis, perf colon and elevated LFTs (amio  and coumadin stopped)  . C. difficile colitis 01/05/5858 complicated post CABG course with prolonged admission, perforated sigmoid colon;  s/p sigmoid colectomy with end colostomy (HaJeanette Capriceocedure);  laparotomy wound infection with Pseudomonas treated with antibxs and secondary intention  . 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 Surgical wound infection     Pseudomonas; wound VAC; secondary intention  . 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 Hair loss     after heart surgery  . Hypertension     takes Metoprolol daily  . Mitral valve prolapse     was told this in the late 1990's; ;then with Dr.Mclean he states that she doesn't  . Arthritis     neck and back  . PONV (postoperative nausea and vomiting)     w/appy  . Complication of anesthesia 12/2010    "had trouble waking me up after colon OR"  . Myocardial infarction 11/2010  . History of bronchitis     late 1980's  . Anemia     "in my early teens"  . H/O hiatal hernia     "slight; not having problems with it"  . Seizures     "w/verapimil"  . Hemorrhoids     "none since hemorrhoid OR"  . Chronic kidney disease     hx kidney stones    Past Surgical History  Procedure Laterality  Date  . Oophorectomy  2005    left; w/"mass removal"  . Hemorrhoid surgery  ~ 2005    x 2  . Sigmoidectomy  12/18/2010    with end colostomy  . Appendectomy      as a child  . Colonoscopy    . Esophagogastroduodenoscopy    . Tubal ligation    . Colostomy reversal  06/27/11  . Coronary artery bypass graft  11-25-2010    CABG X3; LIMA to LAD, SVG to PDA, SVG to OM, EVH via right thigh  . Cardiac catheterization  11/21/10  . Proctoscopy  06/27/2011    Procedure: PROCTOSCOPY;  Surgeon: Gayland Curry, MD;  Location: [facility];  Service: General;  Laterality: N/A;  . Colostomy reconnect    . Flexible sigmoidoscopy  11/09/2011    Procedure: FLEXIBLE SIGMOIDOSCOPY;  Surgeon: Milus Banister, MD;  Location: [facility];   Service: Endoscopy;  Laterality: N/A;    Current Outpatient Prescriptions  Medication Sig Dispense Refill  . aspirin 81 MG tablet Take 81 mg by mouth daily.      . Bacillus Coagulans-Inulin (PROBIOTIC-PREBIOTIC) 1-250 BILLION-MG CAPS Take 1 capsule by mouth daily.       . Budesonide (UCERIS) 9 MG TB24 Take 9 mg by mouth daily.  30 tablet  3  . Calcium Carbonate-Vitamin D 600-400 MG-UNIT per tablet Take 1 tablet by mouth daily.       . Coenzyme Q10 200 MG TABS Take 1 tablet by mouth daily.     0  . metoprolol succinate (TOPROL-XL) 25 MG 24 hr tablet Take 1 tablet (25 mg total) by mouth daily.  90 tablet  3  . Multiple Vitamin (MULTIVITAMIN) tablet Take 1 tablet by mouth daily.       . pravastatin (PRAVACHOL) 40 MG tablet Take 1 tablet (40 mg total) by mouth daily. At bedtime  90 tablet  3   No current facility-administered medications for this visit.    Allergies as of 01/29/2013 - Review Complete 01/29/2013  Allergen Reaction Noted  . Sulfa antibiotics Hives 11/20/2011  . Sulfonamide derivatives Anaphylaxis   . Nadolol Hives 08/02/2009  . Other Other (See Comments) 06/22/2011  . Verapamil Other (See Comments)     Family History  Problem Relation Age of Onset  . Heart attack Father 56    died  . Heart disease Father     massive heart attack  . Coronary artery disease Mother 72    had bypass [place]  . Heart disease Mother   . Anesthesia problems Neg Hx   . Hypotension Neg Hx   . Malignant hyperthermia Neg Hx   . Pseudochol deficiency Neg Hx   . Colon cancer Neg Hx   . Esophageal cancer Neg Hx   . Stomach cancer Neg Hx   . Rectal cancer Neg Hx     History   Social History  . Marital Status: Married    Spouse Name: N/A    Number of Children: 1  . Years of Education: N/A   Occupational History  . LPN    Social History Main Topics  . Smoking status: Never Smoker   . Smokeless tobacco: Never Used  . Alcohol Use: No  . Drug Use: No  . Sexual Activity: Yes     Birth Control/ Protection: Post-menopausal   Other Topics Concern  . Not on file   Social History Narrative  . No narrative on file      Physical  Exam: BP 100/60  Pulse 55  Ht 5' 6.5" (1.689 m)  Wt 143 lb 8 oz (65.091 kg)  BMI 22.82 kg/m2  SpO2 99% Constitutional: generally well-appearing Psychiatric: alert and oriented x3 Abdomen: soft, mild left lower quadrant tenderness, nondistended, no obvious ascites, no peritoneal signs, normal bowel sounds     Assessment and plan: 72 y.o. female with chronic colitis, pancolitis  uceris at 9 mg per day is helping her symptoms. Prednisone caused significant heart racing, hot flashes. I'm going to try to transition her to mesalamine and have prescribed her 4.8 g of lialda once daily. She'll stay on uceris for another 3 weeks after starting lialda.    She will return to see me in 4-5 weeks and sooner if needed.

## 2013-03-10 ENCOUNTER — Ambulatory Visit (INDEPENDENT_AMBULATORY_CARE_PROVIDER_SITE_OTHER): Payer: Medicare Other | Admitting: Gastroenterology

## 2013-03-10 ENCOUNTER — Encounter: Payer: Self-pay | Admitting: Gastroenterology

## 2013-03-10 VITALS — BP 120/62 | HR 68 | Ht 65.75 in | Wt 144.1 lb

## 2013-03-10 DIAGNOSIS — K5190 Ulcerative colitis, unspecified, without complications: Secondary | ICD-10-CM | POA: Insufficient documentation

## 2013-03-10 MED ORDER — MESALAMINE 1.2 G PO TBEC: 4800.0000 mg | DELAYED_RELEASE_TABLET | Freq: Every day | ORAL | Status: DC

## 2013-03-10 NOTE — Progress Notes (Signed)
Review of pertinent gastrointestinal problems:  1. C. Diff coltis, eventually perforated sigmoid, s/p segmental colectomy, reversed 2/13.  2. 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 She underwent 3-4 sequential sigmoidoscopies with dilation of the stricture. Eventually dilated up to 20 mm with very good results in her diarrhea. Last examination was August 2013, this was a full colonoscopy. No polyps were seen. She did have proximal to the site of the previous stricture, the colon was somewhat edematous.  3. routine risk for colon cancer, next colonoscopy August 2023  4. Chronic colitis, July 2014 colonoscopy (mildly inflamed colon throughout, biopsies confirmed chronic inflammation). This was done for diarrhea. CC anastomosis at the time was open to 520m, repeat dilation performed. Pred 244mdaily was started. Hot flashes, racing heart and so changed to uceris 20m21mer day.  Sept 2014; started mesalamine 4.8 grams per day.  HPI: This is a   very pleasant 72 13ar old woman whom I last saw about 5 or 6 weeks ago.  She stopped uceris until about a week ago.    Was on ampicillin, was told she had a UTI in urgent care.  Had slight fever.  Diarrhrea started and she called back to urgent, they stopped the uceris.  Has been on lialda for 5-6 weeks now.    Her bowels are solid, brown. Still going about times per day. Not scibbolous.  No bleeding. She is overall feeling well, going to the gym, walking.  Right wrist pain, very minor.   Past Medical History  Diagnosis Date  . Coronary artery disease     a. NSTEMI 7/12, cath: pLAD 30%, mLAD 60-70%, dLAD 80%, oOM2 50%, mOM2 40%, trifurcation 80%, pRCA 25%, then occluded, L-R collats, EF 55%;   b. 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 OweRoxy Manns. Hyperlipidemia     takes Pravastatin daily  . Pericarditis 1980    hx of  . GERD (gastroesophageal reflux disease)   . Palpitations 2003    hx of; Holter  in 2003 with PACs and PVCs  . Atrial fibrillation     post-op after CABG;  treated with Amiodarone and coumadin until readmxn with CDiff colitis, perf colon and elevated LFTs (amio and coumadin stopped)  . C. difficile colitis 01/04/9923 complicated post CABG course with prolonged admission, perforated sigmoid colon;  s/p sigmoid colectomy with end colostomy (HaJeanette Capriceocedure);  laparotomy wound infection with Pseudomonas treated with antibxs and secondary intention  . 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 Surgical wound infection     Pseudomonas; wound VAC; secondary intention  . 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 Hair loss     after heart surgery  . Hypertension     takes Metoprolol daily  . Mitral valve prolapse     was told this in the late 1990's; ;then with Dr.Mclean he states that she doesn't  . Arthritis     neck and back  . PONV (postoperative nausea and vomiting)     w/appy  . Complication of anesthesia 12/2010    "had trouble waking me up after colon OR"  . Myocardial infarction 11/2010  . History of bronchitis     late 1980's  . Anemia     "in my early teens"  . H/O hiatal hernia     "slight; not having problems with it"  . Seizures     "  w/verapimil"  . Hemorrhoids     "none since hemorrhoid OR"  . Chronic kidney disease     hx kidney stones    Past Surgical History  Procedure Laterality Date  . Oophorectomy  2005    left; w/"mass removal"  . Hemorrhoid surgery  ~ 2005    x 2  . Sigmoidectomy  12/18/2010    with end colostomy  . Appendectomy      as a child  . Colonoscopy    . Esophagogastroduodenoscopy    . Tubal ligation    . Colostomy reversal  06/27/11  . Coronary artery bypass graft  11-25-2010    CABG X3; LIMA to LAD, SVG to PDA, SVG to OM, EVH via right thigh  . Cardiac catheterization  11/21/10  . Proctoscopy  06/27/2011    Procedure: PROCTOSCOPY;  Surgeon: Gayland Curry, MD;  Location: [facility];  Service:  General;  Laterality: N/A;  . Colostomy reconnect    . Flexible sigmoidoscopy  11/09/2011    Procedure: FLEXIBLE SIGMOIDOSCOPY;  Surgeon: Milus Banister, MD;  Location: [facility];  Service: Endoscopy;  Laterality: N/A;    Current Outpatient Prescriptions  Medication Sig Dispense Refill  . aspirin 81 MG tablet Take 81 mg by mouth daily.      . Bacillus Coagulans-Inulin (PROBIOTIC-PREBIOTIC) 1-250 BILLION-MG CAPS Take 1 capsule by mouth daily.       . Budesonide (UCERIS) 9 MG TB24 Take 9 mg by mouth daily.  30 tablet  3  . Calcium Carbonate-Vitamin D 600-400 MG-UNIT per tablet Take 1 tablet by mouth daily.       . Coenzyme Q10 200 MG TABS Take 1 tablet by mouth daily.     0  . mesalamine (LIALDA) 1.2 G EC tablet Take 4 tablets (4.8 g total) by mouth daily with breakfast.  120 tablet  11  . metoprolol succinate (TOPROL-XL) 25 MG 24 hr tablet Take 1 tablet (25 mg total) by mouth daily.  90 tablet  3  . Multiple Vitamin (MULTIVITAMIN) tablet Take 1 tablet by mouth daily.       . pravastatin (PRAVACHOL) 40 MG tablet Take 1 tablet (40 mg total) by mouth daily. At bedtime  90 tablet  3   No current facility-administered medications for this visit.    Allergies as of 03/10/2013 - Review Complete 03/10/2013  Allergen Reaction Noted  . Sulfa antibiotics Hives 11/20/2011  . Sulfonamide derivatives Anaphylaxis   . Nadolol Hives 08/02/2009  . Other Other (See Comments) 06/22/2011  . Verapamil Other (See Comments)     Family History  Problem Relation Age of Onset  . Heart attack Father 47    died  . Heart disease Father     massive heart attack  . Coronary artery disease Mother 61    [place]  . Heart disease Mother   . Anesthesia problems Neg Hx   . Hypotension Neg Hx   . Malignant hyperthermia Neg Hx   . Pseudochol deficiency Neg Hx   . Colon cancer Neg Hx   . Esophageal cancer Neg Hx   . Stomach cancer Neg Hx   . Rectal cancer Neg Hx     History   Social  History  . Marital Status: Married    Spouse Name: N/A    Number of Children: 1  . Years of Education: N/A   Occupational History  . LPN    Social History Main Topics  . Smoking status: Never  Smoker   . Smokeless tobacco: Never Used  . Alcohol Use: No  . Drug Use: No  . Sexual Activity: Yes    Birth Control/ Protection: Post-menopausal   Other Topics Concern  . Not on file   Social History Narrative  . No narrative on file      Physical Exam: BP 120/62  Pulse 68  Ht 5' 5.75" (1.67 m)  Wt 144 lb 2 oz (65.375 kg)  BMI 23.44 kg/m2 Constitutional: generally well-appearing Psychiatric: alert and oriented x3 Abdomen: soft, nontender, nondistended, no obvious ascites, no peritoneal signs, normal bowel sounds     Assessment and plan: 72 y.o. female with ulcerative colitis, doing well on mesalamine  She has been on mesalamine 4.8 g once daily for the past 4 or 5 weeks and has effectively, off of steroids. She is having 4 stools per day but they are not loose any more and they are nonbloody. She is very happy overall with the way things are going with her GI tract. She is going to continue on her current mesalamine dose. She'll return to see me in 3 or 4 months and sooner if needed. She is switching insurance companies soon and I explained to her that mesalamine is available in several different formulations and we will help to work with her and she knows which formulation is best covered.

## 2013-03-10 NOTE — Patient Instructions (Signed)
New prescription for generic lialda was called in. Take four pills once per day. Please return to see Dr. Ardis Hughs in 4 months, sooner if needed.

## 2013-03-19 ENCOUNTER — Telehealth: Payer: Self-pay | Admitting: Gastroenterology

## 2013-03-20 ENCOUNTER — Telehealth: Payer: Self-pay

## 2013-03-20 ENCOUNTER — Other Ambulatory Visit: Payer: Self-pay

## 2013-03-20 NOTE — Telephone Encounter (Signed)
Dr Ardis Hughs the insurance for the pt is changing and her Doristine Johns is going to cost $400 dollars a month, can she try balsalazide or  Apriso?

## 2013-03-20 NOTE — Telephone Encounter (Signed)
Pt says lialda is going to Tier 4 and will double the cost to $400 per month.  She will get a list of what is covered and call back and I will show Dr Ardis Hughs and see if he is ok with her changing or try and get a tier exception for Lialda

## 2013-03-21 NOTE — Telephone Encounter (Signed)
Left message on machine to call back  

## 2013-03-21 NOTE — Telephone Encounter (Signed)
Yes, can you call in apriso 0.375g ER pills.  She needs to take 4 pills, once daily.  Disp 120 with 6 refills.

## 2013-03-24 NOTE — Telephone Encounter (Signed)
Pt is aware and will call after the first of the year when her insurance changes to try the Apriso.

## 2013-05-01 ENCOUNTER — Telehealth: Payer: Self-pay | Admitting: Gastroenterology

## 2013-05-01 NOTE — Telephone Encounter (Signed)
Pt was given 6 days of samples I will try and get her more samples to last until January when she is out of the donut hole

## 2013-05-01 NOTE — Telephone Encounter (Signed)
Samples requested from Lialda rep

## 2013-05-12 ENCOUNTER — Telehealth: Payer: Self-pay

## 2013-05-12 NOTE — Telephone Encounter (Signed)
Message copied by Barron Alvine on Mon May 12, 2013 10:10 AM ------      Message from: Barron Alvine      Created: Mon May 12, 2013 10:10 AM                   ----- Message -----         From: Barron Alvine, CMA         Sent: 05/15/2013           To: Barron Alvine, CMA                        ----- Message -----         From: Barron Alvine, CMA         Sent: 05/09/2013           To: Barron Alvine, CMA            Waiting for lialda samples for pt ------

## 2013-05-12 NOTE — Telephone Encounter (Signed)
Pt notified samples of Lialda at the front desk, pt states she is doing well on 2 pills daily and would like to stay on 2 pills. Dr Ardis Hughs verbally ok'd the change

## 2013-05-13 ENCOUNTER — Other Ambulatory Visit: Payer: Self-pay | Admitting: *Deleted

## 2013-05-22 ENCOUNTER — Ambulatory Visit (INDEPENDENT_AMBULATORY_CARE_PROVIDER_SITE_OTHER): Payer: Medicare HMO | Admitting: Cardiology

## 2013-05-22 ENCOUNTER — Encounter: Payer: Self-pay | Admitting: Cardiology

## 2013-05-22 VITALS — BP 144/76 | HR 51 | Ht 66.0 in | Wt 146.0 lb

## 2013-05-22 DIAGNOSIS — R0609 Other forms of dyspnea: Secondary | ICD-10-CM

## 2013-05-22 DIAGNOSIS — E785 Hyperlipidemia, unspecified: Secondary | ICD-10-CM

## 2013-05-22 DIAGNOSIS — I4891 Unspecified atrial fibrillation: Secondary | ICD-10-CM

## 2013-05-22 DIAGNOSIS — R0602 Shortness of breath: Secondary | ICD-10-CM

## 2013-05-22 DIAGNOSIS — R0600 Dyspnea, unspecified: Secondary | ICD-10-CM

## 2013-05-22 DIAGNOSIS — R0989 Other specified symptoms and signs involving the circulatory and respiratory systems: Secondary | ICD-10-CM

## 2013-05-22 DIAGNOSIS — I2510 Atherosclerotic heart disease of native coronary artery without angina pectoris: Secondary | ICD-10-CM

## 2013-05-22 LAB — CBC WITH DIFFERENTIAL/PLATELET
BASOS ABS: 0 10*3/uL (ref 0.0–0.1)
Basophils Relative: 0.3 % (ref 0.0–3.0)
EOS ABS: 0 10*3/uL (ref 0.0–0.7)
Eosinophils Relative: 0.1 % (ref 0.0–5.0)
HCT: 41.2 % (ref 36.0–46.0)
Hemoglobin: 13.8 g/dL (ref 12.0–15.0)
LYMPHS PCT: 33.1 % (ref 12.0–46.0)
Lymphs Abs: 1.3 10*3/uL (ref 0.7–4.0)
MCHC: 33.5 g/dL (ref 30.0–36.0)
MCV: 86.2 fl (ref 78.0–100.0)
MONO ABS: 0.4 10*3/uL (ref 0.1–1.0)
Monocytes Relative: 9.9 % (ref 3.0–12.0)
NEUTROS PCT: 56.6 % (ref 43.0–77.0)
Neutro Abs: 2.3 10*3/uL (ref 1.4–7.7)
Platelets: 122 10*3/uL — ABNORMAL LOW (ref 150.0–400.0)
RBC: 4.78 Mil/uL (ref 3.87–5.11)
RDW: 14.4 % (ref 11.5–14.6)
WBC: 4 10*3/uL — ABNORMAL LOW (ref 4.5–10.5)

## 2013-05-22 LAB — BASIC METABOLIC PANEL
BUN: 17 mg/dL (ref 6–23)
CO2: 31 mEq/L (ref 19–32)
CREATININE: 0.9 mg/dL (ref 0.4–1.2)
Calcium: 9.5 mg/dL (ref 8.4–10.5)
Chloride: 103 mEq/L (ref 96–112)
GFR: 69.15 mL/min (ref >60.00)
GLUCOSE: 95 mg/dL (ref 70–99)
POTASSIUM: 4.1 meq/L (ref 3.5–5.1)
Sodium: 141 mEq/L (ref 135–145)

## 2013-05-22 LAB — LIPID PANEL
CHOLESTEROL: 183 mg/dL (ref 0–200)
HDL: 60.1 mg/dL (ref >39.00)
LDL Cholesterol: 102 mg/dL — ABNORMAL HIGH (ref 0–99)
TRIGLYCERIDES: 104 mg/dL (ref 0.0–149.0)
Total CHOL/HDL Ratio: 3
VLDL: 20.8 mg/dL (ref 0.0–40.0)

## 2013-05-22 NOTE — Patient Instructions (Signed)
Your physician recommends that you have  lab work today--Lipid profile/BMET/CBCd  You have been referred to Dr Chase Caller at [facility] for evaluation of your shortness of breath   Your physician wants you to follow-up in: 1 year with Dr Aundra Dubin. (January 2016).  You will receive a reminder letter in the mail two months in advance. If you don't receive a letter, please call our office to schedule the follow-up appointment.

## 2013-05-23 DIAGNOSIS — R0600 Dyspnea, unspecified: Secondary | ICD-10-CM | POA: Insufficient documentation

## 2013-05-23 NOTE — Progress Notes (Signed)
Patient ID: [name], female   DOB: 08-Oct-1940, 73 y.o.   MRN: [id number] PCP: Dr. Dena Billet  73 yo presented to [facility] in 7/12 with chest pain.  She ruled in for NSTEMI.  Cardiac catheterization 7/12: Proximal LAD 30%, mid LAD 60-70%, distal LAD 80%, ostial OM2 50%, mid OM2 40%, 80% at the trifurcation, proximal RCA 25% then occluded with left to right collaterals, EF 55%.  Echocardiogram 7/12: EF 60-65%, PASP 45.  She was referred for bypass surgery.  Grafts included a LIMA-LAD, SVG-D1, SVG-PDA.  Of note, she did have an endarterectomy of the OM.  She developed postoperative atrial fibrillation and was treated with Coumadin and amiodarone.  She maintained normal sinus rhythm with this.  Unfortunately, she developed C. Difficile colitis.  She improved and was eventually discharged home in 7/12.  She was readmitted 8/3-8/18 with failure to thrive, anasarca and profound diarrhea.  She was placed on IV vancomycin.  She was diuresed for anasarca.  Relook echocardiogram demonstrated normal LV function.  CT scan demonstrated a perforated sigmoid colon and she was referred for exploratory laparotomy.  She underwent sigmoid colectomy with end colostomy [name] procedure) by Dr. [name].  She had her colostomy reversed in 2/13.  Finally, in 2014 she was diagnosed with ulcerative colitis and started on mesalamine.   [name] is having no exertional chest pain or dyspnea. Her abdominal symptoms have been doing well on mesalamine.  She is working out 3 times a week at a gym.  Her main complaint has been a sensation of needing to clear her throat frequently and a sensation of dyspnea while she is talking.  She has a "catch" in her voice while talking.  This has been going on since CABG.  Her family wants her to get this checked out.   Labs (9/12): K 4.7, creatinine 0.6, LFTs normal, HCT 33.2 Labs (3/13): LDL 82, HDL 52, K 4.1, creatinine 0.8, LDL 74, HDL 66 Labs (1/14): LDL 96, HDL 69, K 4.1,  creatinine 0.9  ECG: NSR, LAE, right axis deviation   Past Medical History  Diagnosis Date  . Coronary artery disease     a. NSTEMI 7/12, cath: pLAD 30%, mLAD 60-70%, dLAD 80%, oOM2 50%, mOM2 40%, trifurcation 80%, pRCA 25%, then occluded, L-R collats, EF 55%;   b. 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 Roxy Manns)  . Hyperlipidemia     takes Pravastatin daily  . Pericarditis 1980    hx of  . GERD (gastroesophageal reflux disease)   . Palpitations 2003    hx of; Holter in 2003 with PACs and PVCs  . Atrial fibrillation     post-op after CABG;  treated with Amiodarone and coumadin until readmxn with CDiff colitis, perf colon and elevated LFTs (amio and coumadin stopped)  . C. difficile colitis 07/7856    complicated post CABG course with prolonged admission, perforated sigmoid colon;  s/p sigmoid colectomy with end colostomy [name] procedure);  laparotomy wound infection with Pseudomonas treated with antibxs and secondary intention  . 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 Surgical wound infection     Pseudomonas; wound VAC; secondary intention  . 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 Hair loss     after heart surgery  . Hypertension     takes Metoprolol daily  . Mitral valve prolapse     was told this in the  late 1990's; ;then with Dr.Zacchaeus Halm he states that she doesn't  . Arthritis     neck and back  . PONV (postoperative nausea and vomiting)     w/appy  . Complication of anesthesia 12/2010    "had trouble waking me up after colon OR"  . Myocardial infarction 11/2010  . History of bronchitis     late 1980's  . Anemia     "in my early teens"  . H/O hiatal hernia     "slight; not having problems with it"  . Seizures     "w/verapimil"  . Hemorrhoids     "none since hemorrhoid OR"  . Chronic kidney disease     hx kidney stones  - Ulcerative colitis  Current Outpatient Prescriptions  Medication Sig Dispense Refill  . aspirin  81 MG tablet Take 81 mg by mouth daily.      . Bacillus Coagulans-Inulin (PROBIOTIC-PREBIOTIC) 1-250 BILLION-MG CAPS Take 1 capsule by mouth daily.       . Calcium Carbonate-Vitamin D 600-400 MG-UNIT per tablet Take 1 tablet by mouth daily.       . Coenzyme Q10 200 MG TABS Take 1 tablet by mouth daily.     0  . mesalamine (LIALDA) 1.2 G EC tablet Take 2 tablets (2.4 g total) by mouth daily with breakfast.  120 tablet  6  . metoprolol succinate (TOPROL-XL) 25 MG 24 hr tablet Take 1 tablet (25 mg total) by mouth daily.  90 tablet  3  . Multiple Vitamin (MULTIVITAMIN) tablet Take 1 tablet by mouth daily.       . pravastatin (PRAVACHOL) 40 MG tablet Take 1 tablet (40 mg total) by mouth daily. At bedtime  90 tablet  3   No current facility-administered medications for this visit.    Allergies: Allergies  Allergen Reactions  . Sulfa Antibiotics Hives  . Sulfonamide Derivatives Anaphylaxis  . Nadolol Hives    Rash and nausea  . Other Other (See Comments)    Apples-anaphylaxis  . Verapamil Other (See Comments)    seizures   SH: Lives with husband in [place], retired Therapist, sports.  Nonsmoker.   Vital Signs: BP 144/76  Pulse 51  Ht 5' 6"  (1.676 m)  Wt 66.225 kg (146 lb)  BMI 23.58 kg/m2  PHYSICAL EXAM: General: Well nourished, well developed, in no acute distress HEENT: normal Neck: no JVD At 90 Cardiac:  normal S1, S2; RRR; No murmur.  Lungs:  clear to auscultation bilaterally, no wheezing, rhonchi or rales GHW:EXHB, nontender, no hepatosplenomegaly Ext: no edema Skin: warm and dry Psych: Normal affect  Assesssment/Plan:  Atrial fibrillation Patient had post-operative atrial fibrillation with no documented recurrence. She is in sinus rhythm today. 3-week monitor in 3/13 showed no atrial fibrillation.  Plan anticoagulation if recurrent atrial fibrillation is noted.  CAD  Status post CABG. EF preserved. She is on ASA 81, pravastatin, and Toprol XL.  Doing well overall with no ischemic  symptoms.  HYPERLIPIDEMIA-MIXED Patient unable to tolerate Crestor due to leg weakness. She has done well so far with low dose pravastatin. LDL has not been ideal, but she has not wanted to increase pravastatin. I will check lipids today.  Dyspnea with talking Patient is short of breath with talking but not with exertion.  She has to clear her throat frequently and has a "catch" in her voice.  I wonder if this is a form of upper airways instability.  She is concerned about it.  I will refer her to  pulmonary for evaluation.   Loralie Champagne 05/23/2013   Loralie Champagne 05/23/2013 12:04 AM

## 2013-05-27 ENCOUNTER — Encounter: Payer: Self-pay | Admitting: *Deleted

## 2013-05-28 ENCOUNTER — Telehealth: Payer: Self-pay | Admitting: Cardiology

## 2013-05-28 DIAGNOSIS — I25810 Atherosclerosis of coronary artery bypass graft(s) without angina pectoris: Secondary | ICD-10-CM

## 2013-05-28 DIAGNOSIS — Z79899 Other long term (current) drug therapy: Secondary | ICD-10-CM

## 2013-05-28 DIAGNOSIS — E782 Mixed hyperlipidemia: Secondary | ICD-10-CM

## 2013-05-28 MED ORDER — PRAVASTATIN SODIUM 80 MG PO TABS: 80.0000 mg | ORAL_TABLET | Freq: Every evening | ORAL | Status: DC

## 2013-05-28 NOTE — Telephone Encounter (Signed)
Pt is aware of MD's recommendations. Pravastatin 80 mg one tablet once a day a 30 days supply and 6 refills sent to pt's pharmacy. Lipid/ liver panel fasting  lab work appointment and orders placed in [facility]  for March 16 th 20 15,  pt aware.

## 2013-05-28 NOTE — Telephone Encounter (Signed)
Increase pravastatin to 80 daily, lipids/LFTs in 2 months. Might want to keep pulmonary appt.

## 2013-05-28 NOTE — Telephone Encounter (Signed)
Called pt back and reviewed labs with her.  Per Dr Claris Gladden recommendation pt needs to increase Pravastatin to 40 mg a day and repeat lipid and liver in 2 months.  Per pt she is already taking 40 mg a day.  I will forward to MD for review.  Pt also states she was referred to se pulmonary for the evaluation of her SOB and she may cancel that appointment because she has been using Acap[employer] and her SOB is "better".  She still sounds very SOB EIB not being able to speak more that 2 to 3 words without having to stop and take a breath.  Aware I will forward this information to Dr Aundra Dubin and nurse his.

## 2013-05-28 NOTE — Telephone Encounter (Signed)
New message ° ° ° ° ° °Returning a nurses call °

## 2013-05-29 ENCOUNTER — Other Ambulatory Visit: Payer: Self-pay | Admitting: Cardiology

## 2013-06-19 ENCOUNTER — Telehealth: Payer: Self-pay | Admitting: Cardiology

## 2013-06-19 NOTE — Telephone Encounter (Signed)
Received request from Nurse fax box, documents faxed for surgical clearance. To: [facility]  Fax number: [phone] Attention: 2.5.15/kdm

## 2013-06-23 ENCOUNTER — Encounter: Payer: Self-pay | Admitting: Internal Medicine

## 2013-06-23 ENCOUNTER — Ambulatory Visit (INDEPENDENT_AMBULATORY_CARE_PROVIDER_SITE_OTHER)
Admission: RE | Admit: 2013-06-23 | Discharge: 2013-06-23 | Disposition: A | Discharge status: Home / Self Care | Payer: Medicare HMO | Source: Ambulatory Visit | Attending: Internal Medicine | Admitting: Internal Medicine

## 2013-06-23 ENCOUNTER — Ambulatory Visit (INDEPENDENT_AMBULATORY_CARE_PROVIDER_SITE_OTHER): Payer: Medicare HMO | Admitting: Internal Medicine

## 2013-06-23 VITALS — BP 140/82 | HR 55 | Ht 66.0 in | Wt 147.4 lb

## 2013-06-23 DIAGNOSIS — R0609 Other forms of dyspnea: Secondary | ICD-10-CM

## 2013-06-23 DIAGNOSIS — R0989 Other specified symptoms and signs involving the circulatory and respiratory systems: Secondary | ICD-10-CM

## 2013-06-23 DIAGNOSIS — R0600 Dyspnea, unspecified: Secondary | ICD-10-CM

## 2013-06-23 NOTE — Progress Notes (Signed)
Subjective:    Patient ID: [name], female    DOB: 1941/02/13, 73 y.o.   MRN: [id number] PCP [facility] Angelique Blonder., MD  HPI  IOV 06/23/2013   73 year old female. S/p CABG 9937 with complicated courseand dx of ulcerative coits wti colostimy and 2013 and 2014 onwards on mesalamine  Reports dyspnea since CABGT. She saw cards 05/22/13 and reported to Dr Aundra Dubin that she has sensation of clearing her throat and catch in her voice and thre is conern of upper airway instability but since that visit she is using fluter valve and symptoms are resolved. However, she perceives actual dyspnea (" I cannot get my breath") presumably since CABG but she is unsure. Happens when she is anxious or in stressful situation. Gets stressed easy but denies formal dx of anxiety disorder, meds for anxiety, pscyh eval. EXample she says just coming to my office has made her stressed and dyspneic talking to me. No other clear cut aggrvating or relieving factors. Dysonea is moderate and unrelkated to exertion. No associated cough or chest pain  Pulm rel\evant hx  CXR 06/22/11 - hyperinflated. Otherwise clear  Smoking:  reports that she has never smoked. She has never used smokeless tobacco.   ECho 2011  - Study Conclusions  - Left ventricle: The cavity size was normal. Wall thickness was normal. Systolic function was normal. The estimated ejection fraction was in the range of 55% to 60%. Wall motion was normal; there were no regional wall motion abnormalities. Left ventricular diastolic function parameters were normal. - Mitral valve: Mild regurgitation. - Left atrium: The atrium was mildly dilated. - Right atrium: The atrium was mildly dilated. - Pulmonary arteries: PA peak pressure: 81m Hg (S).    Past Medical History  Diagnosis Date  . Coronary artery disease     a. NSTEMI 7/12, cath: pLAD 30%, mLAD 60-70%, dLAD 80%, oOM2 50%, mOM2 40%, trifurcation 80%, pRCA 25%, then occluded, L-R collats, EF 55%;    b. 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 ORoxy Manns  . Hyperlipidemia     takes Pravastatin daily  . Pericarditis 1980    hx of  . GERD (gastroesophageal reflux disease)   . Palpitations 2003    hx of; Holter in 2003 with PACs and PVCs  . Atrial fibrillation     post-op after CABG;  treated with Amiodarone and coumadin until readmxn with CDiff colitis, perf colon and elevated LFTs (amio and coumadin stopped)  . C. difficile colitis 81/6967   complicated post CABG course with prolonged admission, perforated sigmoid colon;  s/p sigmoid colectomy with end colostomy (Jeanette Capriceprocedure);  laparotomy wound infection with Pseudomonas treated with antibxs and secondary intention  . 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 Surgical wound infection     Pseudomonas; wound VAC; secondary intention  . 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 Hair loss     after heart surgery  . Hypertension     takes Metoprolol daily  . Mitral valve prolapse     was told this in the late 1990's; ;then with Dr.Mclean he states that she doesn't  . Arthritis     neck and back  . PONV (postoperative nausea and vomiting)     w/appy  . Complication of anesthesia 12/2010    "had trouble waking me up after colon OR"  . Myocardial infarction 11/2010  . History of  bronchitis     late 1980's  . Anemia     "in my early teens"  . H/O hiatal hernia     "slight; not having problems with it"  . Seizures     "w/verapimil"  . Hemorrhoids     "none since hemorrhoid OR"  . Chronic kidney disease     hx kidney stones     Family History  Problem Relation Age of Onset  . Heart attack Father 20    died  . Heart disease Father     massive heart attack  . Coronary artery disease Mother 40    [place]  . Heart disease Mother   . Anesthesia problems Neg Hx   . Hypotension Neg Hx   . Malignant hyperthermia Neg Hx   . Pseudochol deficiency Neg Hx   . Colon cancer  Neg Hx   . Esophageal cancer Neg Hx   . Stomach cancer Neg Hx   . Rectal cancer Neg Hx      History   Social History  . Marital Status: Married    Spouse Name: N/A    Number of Children: 1  . Years of Education: N/A   Occupational History  . LPN    Social History Main Topics  . Smoking status: Never Smoker   . Smokeless tobacco: Never Used  . Alcohol Use: No  . Drug Use: No  . Sexual Activity: Yes    Birth Control/ Protection: Post-menopausal   Other Topics Concern  . Not on file   Social History Narrative  . No narrative on file     Allergies  Allergen Reactions  . Sulfa Antibiotics Hives  . Sulfonamide Derivatives Anaphylaxis  . Nadolol Hives    Rash and nausea  . Other Other (See Comments)    Apples-anaphylaxis  . Verapamil Other (See Comments)    seizures     Outpatient Prescriptions Prior to Visit  Medication Sig Dispense Refill  . aspirin 81 MG tablet Take 81 mg by mouth daily.      . Bacillus Coagulans-Inulin (PROBIOTIC-PREBIOTIC) 1-250 BILLION-MG CAPS Take 1 capsule by mouth daily.       . Calcium Carbonate-Vitamin D 600-400 MG-UNIT per tablet Take 1 tablet by mouth daily.       . Coenzyme Q10 200 MG TABS Take 1 tablet by mouth daily.     0  . mesalamine (LIALDA) 1.2 G EC tablet Take 2 tablets (2.4 g total) by mouth daily with breakfast.  120 tablet  6  . metoprolol succinate (TOPROL-XL) 25 MG 24 hr tablet TAKE ONE TABLET BY MOUTH EVERY DAY  90 tablet  2  . Multiple Vitamin (MULTIVITAMIN) tablet Take 1 tablet by mouth daily.       . pravastatin (PRAVACHOL) 80 MG tablet Take 1 tablet (80 mg total) by mouth every evening.  30 tablet  6   No facility-administered medications prior to visit.      Review of Systems  Constitutional: Negative for fever and unexpected weight change.  HENT: Negative for congestion, dental problem, ear pain, nosebleeds, postnasal drip, rhinorrhea, sinus pressure, sneezing, sore throat and trouble swallowing.   Eyes:  Negative for redness and itching.  Respiratory: Positive for shortness of breath. Negative for cough, chest tightness and wheezing.   Cardiovascular: Negative for palpitations and leg swelling.  Gastrointestinal: Negative for nausea and vomiting.  Genitourinary: Negative for dysuria.  Musculoskeletal: Negative for joint swelling.  Skin: Negative for rash.  Neurological: Negative for headaches.  Hematological: Does not bruise/bleed easily.  Psychiatric/Behavioral: Negative for dysphoric mood. The patient is nervous/anxious.        Objective:   Physical Exam  Vitals reviewed. Constitutional: She is oriented to person, place, and time. She appears well-developed and well-nourished. No distress.  HENT:  Head: Normocephalic and atraumatic.  Right Ear: External ear normal.  Left Ear: External ear normal.  Mouth/Throat: Oropharynx is clear and moist. No oropharyngeal exudate.  Somewhat dyspneic talking to me: gasping a bit  Eyes: Conjunctivae and EOM are normal. Pupils are equal, round, and reactive to light. Right eye exhibits no discharge. Left eye exhibits no discharge. No scleral icterus.  Neck: Normal range of motion. Neck supple. No JVD present. No tracheal deviation present. No thyromegaly present.  Cardiovascular: Normal rate, regular rhythm, normal heart sounds and intact distal pulses.  Exam reveals no gallop and no friction rub.   No murmur heard. Pulmonary/Chest: Effort normal and breath sounds normal. No respiratory distress. She has no wheezes. She has no rales. She exhibits no tenderness.  cabg scar  Abdominal: Soft. Bowel sounds are normal. She exhibits no distension and no mass. There is no tenderness. There is no rebound and no guarding.  Musculoskeletal: Normal range of motion. She exhibits no edema and no tenderness.  Lymphadenopathy:    She has no cervical adenopathy.  Neurological: She is alert and oriented to person, place, and time. She has normal reflexes. No  cranial nerve deficit. She exhibits normal muscle tone. Coordination normal.  Skin: Skin is warm. No rash noted. She is not diaphoretic. No erythema. No pallor.  Cool and sweaty palms +  Psychiatric: She has a normal mood and affect. Her behavior is normal. Judgment and thought content normal.          Assessment & Plan:

## 2013-06-23 NOTE — Patient Instructions (Signed)
#  Shortness of breath Please do walk test for oxygen levels Please do PFT breathing test Please do CXR 2 viuew  #Followup Return to see me or my NP after this next few weeks Depending on results you might need additional tests like CPST bike test or methacholine challlenge test which we will explain at followup

## 2013-06-30 ENCOUNTER — Telehealth: Payer: Self-pay | Admitting: Gastroenterology

## 2013-06-30 MED ORDER — MESALAMINE ER 0.375 G PO CP24: 1500.0000 mg | ORAL_CAPSULE | Freq: Every day | ORAL | Status: DC

## 2013-06-30 NOTE — Telephone Encounter (Signed)
Medication has been sent per request

## 2013-07-05 NOTE — Assessment & Plan Note (Addendum)
#  Shortness of breath  Unclear cause  PLAN Please do walk test for oxygen levels Please do PFT breathing test Please do CXR 2 viuew  #Followup Return to see me or my NP after this next few weeks Depending on results you might need additional tests like CPST bike test or methacholine challlenge test which we will explain at followup

## 2013-07-11 ENCOUNTER — Encounter: Payer: Self-pay | Admitting: Gastroenterology

## 2013-07-11 ENCOUNTER — Ambulatory Visit (INDEPENDENT_AMBULATORY_CARE_PROVIDER_SITE_OTHER): Payer: Medicare HMO | Admitting: Gastroenterology

## 2013-07-11 VITALS — BP 128/62 | HR 60 | Ht 66.0 in | Wt 148.0 lb

## 2013-07-11 DIAGNOSIS — K5190 Ulcerative colitis, unspecified, without complications: Secondary | ICD-10-CM

## 2013-07-11 NOTE — Progress Notes (Signed)
Review of pertinent gastrointestinal problems:  1. C. Diff coltis, eventually perforated sigmoid, s/p segmental colectomy, reversed 2/13.  2. 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 She underwent 3-4 sequential sigmoidoscopies with dilation of the stricture. Eventually dilated up to 20 mm with very good results in her diarrhea. Last examination was August 2013, this was a full colonoscopy. No polyps were seen. She did have proximal to the site of the previous stricture, the colon was somewhat edematous.  3. routine risk for colon cancer, next colonoscopy August 2023  4. Chronic colitis, July 2014 colonoscopy (mildly inflamed colon throughout, biopsies confirmed chronic inflammation). This was done for diarrhea. CC anastomosis at the time was open to 34m, repeat dilation performed. Pred 2826mdaily was started. Hot flashes, racing heart and so changed to uceris 26m45mer day. Sept 2014; started mesalamine 4.8 grams per day.  HPI: This is a   very pleasant 73 65ar old woman whom I last saw about 3 or 4 months ago.   A lot of gas yesterday, 10 solid stools yesterday.  Has been feeling bloated.  Has changed from lialda 2 pills once daily to apriso   4 pills once daily. This was for insurance reasons. I am not clear as to why she had back down lialda 4 pills to 2 pills  Past Medical History  Diagnosis Date  . Coronary artery disease     a. NSTEMI 7/12, cath: pLAD 30%, mLAD 60-70%, dLAD 80%, oOM2 50%, mOM2 40%, trifurcation 80%, pRCA 25%, then occluded, L-R collats, EF 55%;   b. 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 OweRoxy Manns. Hyperlipidemia     takes Pravastatin daily  . Pericarditis 1980    hx of  . GERD (gastroesophageal reflux disease)   . Palpitations 2003    hx of; Holter in 2003 with PACs and PVCs  . Atrial fibrillation     post-op after CABG;  treated with Amiodarone and coumadin until readmxn with CDiff colitis, perf colon and  elevated LFTs (amio and coumadin stopped)  . C. difficile colitis 01/11/4495 complicated post CABG course with prolonged admission, perforated sigmoid colon;  s/p sigmoid colectomy with end colostomy (HaJeanette Capriceocedure);  laparotomy wound infection with Pseudomonas treated with antibxs and secondary intention  . 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 Surgical wound infection     Pseudomonas; wound VAC; secondary intention  . 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 Hair loss     after heart surgery  . Hypertension     takes Metoprolol daily  . Mitral valve prolapse     was told this in the late 1990's; ;then with Dr.Mclean he states that she doesn't  . Arthritis     neck and back  . PONV (postoperative nausea and vomiting)     w/appy  . Complication of anesthesia 12/2010    "had trouble waking me up after colon OR"  . Myocardial infarction 11/2010  . History of bronchitis     late 1980's  . Anemia     "in my early teens"  . H/O hiatal hernia     "slight; not having problems with it"  . Seizures     "w/verapimil"  . Hemorrhoids     "none since hemorrhoid OR"  . Chronic kidney disease     hx kidney stones    Past Surgical History  Procedure Laterality Date  . Oophorectomy  2005    left; w/"mass removal"  . Hemorrhoid surgery  ~ 2005    x 2  . Sigmoidectomy  12/18/2010    with end colostomy  . Appendectomy      as a child  . Colonoscopy    . Esophagogastroduodenoscopy    . Tubal ligation    . Colostomy reversal  06/27/11  . Coronary artery bypass graft  11-25-2010    CABG X3; LIMA to LAD, SVG to PDA, SVG to OM, EVH via right thigh  . Cardiac catheterization  11/21/10  . Proctoscopy  06/27/2011    Procedure: PROCTOSCOPY;  Surgeon: Gayland Curry, MD;  Location: [facility];  Service: General;  Laterality: N/A;  . Colostomy reconnect    . Flexible sigmoidoscopy  11/09/2011    Procedure: FLEXIBLE SIGMOIDOSCOPY;  Surgeon: Milus Banister, MD;   Location: [facility];  Service: Endoscopy;  Laterality: N/A;    Current Outpatient Prescriptions  Medication Sig Dispense Refill  . aspirin 81 MG tablet Take 81 mg by mouth daily.      . Bacillus Coagulans-Inulin (PROBIOTIC-PREBIOTIC) 1-250 BILLION-MG CAPS Take 1 capsule by mouth daily.       . Calcium Carbonate-Vitamin D 600-400 MG-UNIT per tablet Take 1 tablet by mouth daily.       . Coenzyme Q10 200 MG TABS Take 1 tablet by mouth daily.     0  . mesalamine (APRISO) 0.375 G 24 hr capsule Take 4 capsules (1.5 g total) by mouth daily.  120 capsule  6  . metoprolol succinate (TOPROL-XL) 25 MG 24 hr tablet TAKE ONE TABLET BY MOUTH EVERY DAY  90 tablet  2  . Multiple Vitamin (MULTIVITAMIN) tablet Take 1 tablet by mouth daily.       . pravastatin (PRAVACHOL) 80 MG tablet Take 1 tablet (80 mg total) by mouth every evening.  30 tablet  6   No current facility-administered medications for this visit.    Allergies as of 07/11/2013 - Review Complete 07/11/2013  Allergen Reaction Noted  . Sulfa antibiotics Hives 11/20/2011  . Sulfonamide derivatives Anaphylaxis   . Nadolol Hives 08/02/2009  . Other Other (See Comments) 06/22/2011  . Verapamil Other (See Comments)     Family History  Problem Relation Age of Onset  . Heart attack Father 87    died  . Heart disease Father     massive heart attack  . Coronary artery disease Mother 61    [place]  . Heart disease Mother   . Anesthesia problems Neg Hx   . Hypotension Neg Hx   . Malignant hyperthermia Neg Hx   . Pseudochol deficiency Neg Hx   . Colon cancer Neg Hx   . Esophageal cancer Neg Hx   . Stomach cancer Neg Hx   . Rectal cancer Neg Hx     History   Social History  . Marital Status: Married    Spouse Name: N/A    Number of Children: 1  . Years of Education: N/A   Occupational History  . LPN    Social History Main Topics  . Smoking status: Never Smoker   . Smokeless tobacco: Never Used  . Alcohol Use:  No  . Drug Use: No  . Sexual Activity: Yes    Birth Control/ Protection: Post-menopausal   Other Topics Concern  . Not on file   Social History Narrative  . No narrative on file  Physical Exam: BP 128/62  Pulse 60  Ht 5' 6"  (1.676 m)  Wt 148 lb (67.132 kg)  BMI 23.90 kg/m2 Constitutional: generally well-appearing Psychiatric: alert and oriented x3 Abdomen: soft, nontender, nondistended, no obvious ascites, no peritoneal signs, normal bowel sounds     Assessment and plan: 73 y.o. female with ulcerative colitis  She just changed from 1 formulation of mesalamine to another and yesterday had dyspepsia, bloating, 10 small solid stools throughout the day. Perhaps this GI issue is the beginning of the new trend since changing the formulation of her mesalamine. Perhaps it is just a singular dyspeptic event, perhaps viral in etiology.  She will return to see me in 4 weeks and sooner if needed.

## 2013-07-11 NOTE — Patient Instructions (Signed)
Stay on apriso 4 pills once daily. Return to see Dr. Ardis Hughs in 4 weeks, call sooner if needed.

## 2013-07-28 ENCOUNTER — Other Ambulatory Visit: Payer: Medicare HMO

## 2013-07-29 ENCOUNTER — Ambulatory Visit (INDEPENDENT_AMBULATORY_CARE_PROVIDER_SITE_OTHER): Payer: Medicare HMO | Admitting: Internal Medicine

## 2013-07-29 ENCOUNTER — Other Ambulatory Visit (INDEPENDENT_AMBULATORY_CARE_PROVIDER_SITE_OTHER): Payer: Medicare HMO

## 2013-07-29 ENCOUNTER — Encounter: Payer: Self-pay | Admitting: Internal Medicine

## 2013-07-29 VITALS — BP 120/70 | HR 66 | Ht 65.25 in | Wt 147.0 lb

## 2013-07-29 DIAGNOSIS — R0609 Other forms of dyspnea: Secondary | ICD-10-CM

## 2013-07-29 DIAGNOSIS — R0600 Dyspnea, unspecified: Secondary | ICD-10-CM

## 2013-07-29 DIAGNOSIS — R0989 Other specified symptoms and signs involving the circulatory and respiratory systems: Secondary | ICD-10-CM

## 2013-07-29 DIAGNOSIS — E782 Mixed hyperlipidemia: Secondary | ICD-10-CM

## 2013-07-29 DIAGNOSIS — Z79899 Other long term (current) drug therapy: Secondary | ICD-10-CM

## 2013-07-29 DIAGNOSIS — I25810 Atherosclerosis of coronary artery bypass graft(s) without angina pectoris: Secondary | ICD-10-CM

## 2013-07-29 LAB — PULMONARY FUNCTION TEST
DL/VA % pred: 103 %
DL/VA: 5.11 ml/min/mmHg/L
DLCO UNC: 24.45 ml/min/mmHg
DLCO unc % pred: 94 %
FEF 25-75 POST: 2.41 L/s
FEF 25-75 Pre: 1.63 L/sec
FEF2575-%Change-Post: 47 %
FEF2575-%Pred-Post: 125 %
FEF2575-%Pred-Pre: 84 %
FEV1-%CHANGE-POST: 8 %
FEV1-%PRED-POST: 100 %
FEV1-%Pred-Pre: 92 %
FEV1-POST: 2.37 L
FEV1-Pre: 2.18 L
FEV1FVC-%CHANGE-POST: 4 %
FEV1FVC-%Pred-Pre: 97 %
FEV6-%Change-Post: 4 %
FEV6-%PRED-PRE: 96 %
FEV6-%Pred-Post: 101 %
FEV6-PRE: 2.88 L
FEV6-Post: 3 L
FEV6FVC-%Change-Post: 0 %
FEV6FVC-%PRED-POST: 103 %
FEV6FVC-%PRED-PRE: 104 %
FVC-%CHANGE-POST: 3 %
FVC-%PRED-PRE: 94 %
FVC-%Pred-Post: 97 %
FVC-PRE: 2.93 L
FVC-Post: 3.04 L
PRE FEV1/FVC RATIO: 74 %
PRE FEV6/FVC RATIO: 99 %
Post FEV1/FVC ratio: 78 %
Post FEV6/FVC ratio: 99 %
RV % PRED: 98 %
RV: 2.24 L
TLC % pred: 97 %
TLC: 5.1 L

## 2013-07-29 LAB — HEPATIC FUNCTION PANEL
ALBUMIN: 4 g/dL (ref 3.5–5.2)
ALT: 23 U/L (ref 0–35)
AST: 24 U/L (ref 0–37)
Alkaline Phosphatase: 48 U/L (ref 39–117)
Bilirubin, Direct: 0.1 mg/dL (ref 0.0–0.3)
TOTAL PROTEIN: 7 g/dL (ref 6.0–8.3)
Total Bilirubin: 0.7 mg/dL (ref 0.3–1.2)

## 2013-07-29 LAB — LIPID PANEL
CHOLESTEROL: 150 mg/dL (ref 0–200)
HDL: 57.2 mg/dL (ref >39.00)
LDL Cholesterol: 74 mg/dL (ref 0–99)
TRIGLYCERIDES: 94 mg/dL (ref 0.0–149.0)
Total CHOL/HDL Ratio: 3
VLDL: 18.8 mg/dL (ref 0.0–40.0)

## 2013-07-29 NOTE — Progress Notes (Signed)
PFT done today. 

## 2013-07-29 NOTE — Patient Instructions (Signed)
Respect your decision to hold off all blood test and bike stress test for now Return in July 2015 for further discussion

## 2013-07-29 NOTE — Progress Notes (Signed)
Subjective:    Patient ID: [name], female    DOB: 04-20-1941, 73 y.o.   MRN: [id number]  HPI  IOV 06/23/2013   73 year old female. S/p CABG 0932 with complicated courseand dx of ulcerative coits wti colostimy and 2013 and 2014 onwards on mesalamine  Reports dyspnea since CABG. She saw cards 05/22/13 and reported to Dr Aundra Dubin that she has sensation of clearing her throat and catch in her voice and thre is conern of upper airway instability but since that visit she is using fluter valve and symptoms are resolved. However, she perceives actual dyspnea (" I cannot get my breath") presumably since CABG but she is unsure. Happens when she is anxious or in stressful situation. Gets stressed easy but denies formal dx of anxiety disorder, meds for anxiety, pscyh eval. EXample she says just coming to my office has made her stressed and dyspneic talking to me. No other clear cut aggrvating or relieving factors. Dysonea is moderate and unrelkated to exertion. No associated cough or chest pain  Pulm rel\evant hx  CXR 06/22/11 06/23/2013 - hyperinflated. Otherwise clear  Smoking:  reports that she has never smoked. She has never used smokeless tobacco.   ECho 2011  - Study Conclusions  - Left ventricle: The cavity size was normal. Wall thickness was normal. Systolic function was normal. The estimated ejection fraction was in the range of 55% to 60%. Wall motion was normal; there were no regional wall motion abnormalities. Left ventricular diastolic function parameters were normal. - Mitral valve: Mild regurgitation. - Left atrium: The atrium was mildly dilated. - Right atrium: The atrium was mildly dilated. - Pulmonary arteries: PA peak pressure: 59m Hg (S).  - HGb JAn 2105: normal, creat normal. TSH normal   has a past medical history of Coronary artery disease; Hyperlipidemia; Pericarditis (1980); GERD (gastroesophageal reflux disease); Palpitations (2003); Atrial fibrillation; C.  difficile colitis (12/2010); S/P colostomy; Surgical wound infection; S/P CABG x 3 (11/25/2010); Hair loss; Hypertension; Mitral valve prolapse; Arthritis; PONV (postoperative nausea and vomiting); Complication of anesthesia (12/2010); Myocardial infarction (11/2010); History of bronchitis; Anemia; H/O hiatal hernia; Seizures; Hemorrhoids; and Chronic kidney disease.   has past surgical history that includes Oophorectomy (2005); Hemorrhoid surgery (~ 2005); Sigmoidectomy (12/18/2010); Appendectomy; Colonoscopy; Esophagogastroduodenoscopy; Tubal ligation; colostomy reversal (06/27/11); Coronary artery bypass graft (11-25-2010); Cardiac catheterization (11/21/10); Proctoscopy (06/27/2011); COLOSTOMY RECONNECT; and Flexible sigmoidoscopy (11/09/2011).  #Shortness of breath Please do walk test for oxygen levels Please do PFT breathing test Please do CXR 2 viuew  #Followup Return to see me or my NP after this next few weeks Depending on results you might need additional tests like CPST bike test or methacholine challlenge test which we will explain at followup  OV 07/29/2013  Chief Complaint  Patient presents with  . Follow-up    to review PFT results. Pt feels SOB is some better.    Followup for dyspnea. She now presents with her husband. Pulmonary function test was normal. She continues to have dyspnea at random. Although since last visit she is a little better. A normal pulmonary function test and therefore she believes that her dyspnea will spontaneously resolve. Yet at the same time she is worried that it might not. She is an upcoming cataract surgery and therefore she does not want to undergo any more investigations until all this is complete. I even offered her the d-dimer blood test for pulmonary embolism but she doesn't want it. She is having some tremors from the albuterol.  No other new issues   Pulmonary function test shows postbronchodilator FVC of 3.04 L/97%. FEV1 of 2.37 L/100%. Total lung  capacity of 5.1/97%. DLCO 24.4/94%. Prebronchodilator FEV1 and FVC are normal. Overall was a normal pulmonary function test  Review of Systems  Constitutional: Negative for fever and unexpected weight change.  HENT: Negative for congestion, dental problem, ear pain, nosebleeds, postnasal drip, rhinorrhea, sinus pressure, sneezing, sore throat and trouble swallowing.   Eyes: Negative for redness and itching.  Respiratory: Positive for shortness of breath. Negative for cough, chest tightness and wheezing.   Cardiovascular: Negative for palpitations and leg swelling.  Gastrointestinal: Negative for nausea and vomiting.  Genitourinary: Negative for dysuria.  Musculoskeletal: Negative for joint swelling.  Skin: Negative for rash.  Neurological: Negative for headaches.  Hematological: Does not bruise/bleed easily.  Psychiatric/Behavioral: Negative for dysphoric mood. The patient is not nervous/anxious.        Objective:   Physical Exam  Vitals reviewed. Constitutional: She is oriented to person, place, and time. She appears well-developed and well-nourished. No distress.  Thin Anxious Having tremors after albuterol x 4: tremors  HENT:  Head: Normocephalic and atraumatic.  Right Ear: External ear normal.  Left Ear: External ear normal.  Mouth/Throat: Oropharynx is clear and moist. No oropharyngeal exudate.  Eyes: Conjunctivae and EOM are normal. Pupils are equal, round, and reactive to light. Right eye exhibits no discharge. Left eye exhibits no discharge. No scleral icterus.  Neck: Normal range of motion. Neck supple. No JVD present. No tracheal deviation present. No thyromegaly present.  Cardiovascular: Normal rate, regular rhythm, normal heart sounds and intact distal pulses.  Exam reveals no gallop and no friction rub.   No murmur heard. Pulmonary/Chest: Effort normal and breath sounds normal. No respiratory distress. She has no wheezes. She has no rales. She exhibits no tenderness.   Abdominal: Soft. Bowel sounds are normal. She exhibits no distension and no mass. There is no tenderness. There is no rebound and no guarding.  Musculoskeletal: Normal range of motion. She exhibits no edema and no tenderness.  Lymphadenopathy:    She has no cervical adenopathy.  Neurological: She is alert and oriented to person, place, and time. She has normal reflexes. No cranial nerve deficit. She exhibits normal muscle tone. Coordination normal.  Skin: Skin is warm and dry. No rash noted. She is not diaphoretic. No erythema. No pallor.  Psychiatric: She has a normal mood and affect. Her behavior is normal. Judgment and thought content normal.          Assessment & Plan:

## 2013-08-02 NOTE — Assessment & Plan Note (Signed)
Etiology unclear. She wants to hold off further testing till shje completes her catarct surgery. REturn July 2015 for further discussion

## 2013-08-25 ENCOUNTER — Other Ambulatory Visit: Payer: Self-pay

## 2013-08-25 MED ORDER — PRAVASTATIN SODIUM 80 MG PO TABS: 80.0000 mg | ORAL_TABLET | Freq: Every evening | ORAL | Status: DC

## 2013-09-01 ENCOUNTER — Ambulatory Visit (INDEPENDENT_AMBULATORY_CARE_PROVIDER_SITE_OTHER): Payer: Medicare HMO | Admitting: Gastroenterology

## 2013-09-01 ENCOUNTER — Encounter: Payer: Self-pay | Admitting: Gastroenterology

## 2013-09-01 VITALS — BP 124/60 | HR 60 | Ht 65.0 in | Wt 146.6 lb

## 2013-09-01 DIAGNOSIS — K5190 Ulcerative colitis, unspecified, without complications: Secondary | ICD-10-CM

## 2013-09-01 NOTE — Progress Notes (Signed)
Review of pertinent gastrointestinal problems:  1. C. Diff coltis, eventually perforated sigmoid, s/p segmental colectomy, reversed 2/13.  2. 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 She underwent 3-4 sequential sigmoidoscopies with dilation of the stricture. Eventually dilated up to 20 mm with very good results in her diarrhea. Last examination was August 2013, this was a full colonoscopy. No polyps were seen. She did have proximal to the site of the previous stricture, the colon was somewhat edematous.  3. routine risk for colon cancer, next colonoscopy August 2023  4. Chronic colitis, July 2014 colonoscopy (mildly inflamed colon throughout, biopsies confirmed chronic inflammation). This was done for diarrhea. CC anastomosis at the time was open to 32m, repeat dilation performed. Pred 278mdaily was started. Hot flashes, racing heart and so changed to uceris [id number] day. Sept 2014; started mesalamine 4.8 grams per day.   HPI: This is a  very pleasant 73 22ar old woman whom I last saw him on for 6 weeks ago. She had some trouble after switching formulation of her mesalamine. Not was brief, only lasted 2-3 days with GI upset and multiple bowel movements. Since then she has been on stable dose of her new mesalamine product and has been doing very well from a GI perspective.  Has been on apriso once per day.  Has two BMs daily.  Somewhat constipated at times.    Bilateral cataracts this past month.  Still requres corrective lenses.      Past Medical History  Diagnosis Date  . Coronary artery disease     a. NSTEMI 7/12, cath: pLAD 30%, mLAD 60-70%, dLAD 80%, oOM2 50%, mOM2 40%, trifurcation 80%, pRCA 25%, then occluded, L-R collats, EF 55%;   b. 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 OweRoxy Manns. Hyperlipidemia     takes Pravastatin daily  . Pericarditis 1980    hx of  . GERD (gastroesophageal reflux disease)   . Palpitations 2003    hx  of; Holter in 2003 with PACs and PVCs  . Atrial fibrillation     post-op after CABG;  treated with Amiodarone and coumadin until readmxn with CDiff colitis, perf colon and elevated LFTs (amio and coumadin stopped)  . C. difficile colitis 01/08/5642 complicated post CABG course with prolonged admission, perforated sigmoid colon;  s/p sigmoid colectomy with end colostomy (HaJeanette Capriceocedure);  laparotomy wound infection with Pseudomonas treated with antibxs and secondary intention  . 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 Surgical wound infection     Pseudomonas; wound VAC; secondary intention  . 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 Hair loss     after heart surgery  . Hypertension     takes Metoprolol daily  . Mitral valve prolapse     was told this in the late 1990's; ;then with Dr.Mclean he states that she doesn't  . Arthritis     neck and back  . PONV (postoperative nausea and vomiting)     w/appy  . Complication of anesthesia 12/2010    "had trouble waking me up after colon OR"  . Myocardial infarction 11/2010  . History of bronchitis     late 1980's  . Anemia     "in my early teens"  . H/O hiatal hernia     "slight; not having problems with it"  . Seizures     "w/verapimil"  . Hemorrhoids     "  none since hemorrhoid OR"  . Chronic kidney disease     hx kidney stones    Past Surgical History  Procedure Laterality Date  . Oophorectomy  2005    left; w/"mass removal"  . Hemorrhoid surgery  ~ 2005    x 2  . Sigmoidectomy  12/18/2010    with end colostomy  . Appendectomy      as a child  . Colonoscopy    . Esophagogastroduodenoscopy    . Tubal ligation    . Colostomy reversal  06/27/11  . Coronary artery bypass graft  11-25-2010    CABG X3; LIMA to LAD, SVG to PDA, SVG to OM, EVH via right thigh  . Cardiac catheterization  11/21/10  . Proctoscopy  06/27/2011    Procedure: PROCTOSCOPY;  Surgeon: Gayland Curry, MD;  Location: [facility];   Service: General;  Laterality: N/A;  . Colostomy reconnect    . Flexible sigmoidoscopy  11/09/2011    Procedure: FLEXIBLE SIGMOIDOSCOPY;  Surgeon: Milus Banister, MD;  Location: [facility];  Service: Endoscopy;  Laterality: N/A;  . Cataract extraction, bilateral      Current Outpatient Prescriptions  Medication Sig Dispense Refill  . Ascorbic Acid (VITAMIN C) 1000 MG tablet Take 1,000 mg by mouth daily.      Marland Kitchen aspirin 81 MG tablet Take 81 mg by mouth daily.      . Bacillus Coagulans-Inulin (PROBIOTIC-PREBIOTIC) 1-250 BILLION-MG CAPS Take 1 capsule by mouth daily.       . Calcium Carbonate-Vitamin D 600-400 MG-UNIT per tablet Take 1 tablet by mouth daily.       . Coenzyme Q10 200 MG TABS Take 1 tablet by mouth daily.     0  . mesalamine (APRISO) 0.375 G 24 hr capsule Take 4 capsules (1.5 g total) by mouth daily.  120 capsule  6  . metoprolol succinate (TOPROL-XL) 25 MG 24 hr tablet TAKE ONE TABLET BY MOUTH EVERY DAY  90 tablet  2  . Multiple Vitamin (MULTIVITAMIN) tablet Take 1 tablet by mouth daily.       . pravastatin (PRAVACHOL) 80 MG tablet Take 1 tablet (80 mg total) by mouth every evening.  90 tablet  4   No current facility-administered medications for this visit.    Allergies as of 09/01/2013 - Review Complete 09/01/2013  Allergen Reaction Noted  . Sulfa antibiotics Hives 11/20/2011  . Sulfonamide derivatives Anaphylaxis   . Nadolol Hives 08/02/2009  . Other Other (See Comments) 06/22/2011  . Verapamil Other (See Comments)     Family History  Problem Relation Age of Onset  . Heart attack Father 76    died  . Heart disease Father     massive heart attack  . Coronary artery disease Mother 22    [place]  . Heart disease Mother   . Anesthesia problems Neg Hx   . Hypotension Neg Hx   . Malignant hyperthermia Neg Hx   . Pseudochol deficiency Neg Hx   . Colon cancer Neg Hx   . Esophageal cancer Neg Hx   . Stomach cancer Neg Hx   . Rectal cancer Neg Hx      History   Social History  . Marital Status: Married    Spouse Name: N/A    Number of Children: 1  . Years of Education: N/A   Occupational History  . LPN    Social History Main Topics  . Smoking status: Never Smoker   .  Smokeless tobacco: Never Used  . Alcohol Use: No  . Drug Use: No  . Sexual Activity: Yes    Birth Control/ Protection: Post-menopausal   Other Topics Concern  . Not on file   Social History Narrative  . No narrative on file      Physical Exam: BP 124/60  Pulse 60  Ht 5' 5"  (1.651 m)  Wt 146 lb 9.6 oz (66.497 kg)  BMI 24.40 kg/m2 Constitutional: generally well-appearing Psychiatric: alert and oriented x3 Abdomen: soft, nontender, nondistended, no obvious ascites, no peritoneal signs, normal bowel sounds     Assessment and plan: 74 y.o. female with ulcerative colitis  Doing well on mesalamine, 2 pills twice daily. She will continue this regimen for now she knows to call if she has trouble otherwise I will like to see her for followup in 6 months and sooner if needed.

## 2013-09-01 NOTE — Patient Instructions (Signed)
Try metamucil daily instead of as needed. Stay on apriso 4 pills once daily (two pills twice daily is a good option also). Please return to see Dr. Ardis Hughs in 6 months, sooner if needed.

## 2013-10-16 ENCOUNTER — Telehealth: Payer: Self-pay | Admitting: Cardiology

## 2013-10-16 NOTE — Telephone Encounter (Signed)
New message     Pt is having leg cramps.  Can she reduce cholesterol medication to 67m?

## 2013-10-16 NOTE — Telephone Encounter (Signed)
Try coenzyme Q 10 200 mg daily and magnesium oxide 400 mg daily.  If that does not help, can reduce statin.

## 2013-10-16 NOTE — Telephone Encounter (Signed)
Left message for call back.

## 2013-10-20 MED ORDER — MAGNESIUM OXIDE 400 MG PO TABS: 400.0000 mg | ORAL_TABLET | Freq: Every day | ORAL | Status: DC

## 2013-10-20 NOTE — Telephone Encounter (Signed)
Patient aware of Dr.McLean's recommendations. She already takes CoQ 10 but will try MGOxide 464m every day. She will call [place] in a few weeks to let [place] how she is doing.

## 2013-11-20 ENCOUNTER — Telehealth: Payer: Self-pay | Admitting: Cardiology

## 2013-11-20 NOTE — Telephone Encounter (Signed)
Pt wanting to inform Dr Aundra Dubin and nurse that she is still experiencing horrible leg cramps, especially at night.  Pt is taking pravastatin.  Pt states she was prescribed Magnesium to take with her pravastatin, to help with the leg cramps.  Pt would like for Dr Aundra Dubin to readdress this issue, and see if there is something else she can take.  Advised  pt that she could try drinking some tonic water at night to help with the cramps and her RLS.  Informed pt that Dr Aundra Dubin and nurse are both out of the office today, but I will forward this message to them for further review and recommendation, and follow-up thereafter.  Pt verbalized understanding and agrees with this plan.

## 2013-11-20 NOTE — Telephone Encounter (Signed)
Have her try coenzyme Q10 200 mg daily.

## 2013-11-20 NOTE — Telephone Encounter (Signed)
New message     Pt is still having leg cramps while on magnesium.  Is there anything else she can do?

## 2013-11-21 NOTE — Telephone Encounter (Signed)
Contacted pt about Dr Claris Gladden recommendation for her to start taking coenzyme Q 200 mg daily.  Pt states she is already taking this medication.  Pt did say that she went and purchased some tonic water yesterday as recommended, and it helped her sleep better last night.  Pt states she had no leg cramps over the night.  Pt wants to notify Dr Aundra Dubin and nurse of this, and see if they have any other recommendations.  Informed pt that both of them are out of the office today, but I will forward this message to them for further review.  Pt verbalized understanding and very gracious for all the assistance provided.

## 2013-12-02 ENCOUNTER — Telehealth: Payer: Self-pay | Admitting: Gastroenterology

## 2013-12-03 MED ORDER — MESALAMINE ER 0.375 G PO CP24: 375.0000 mg | ORAL_CAPSULE | Freq: Every day | ORAL | Status: DC

## 2013-12-03 NOTE — Telephone Encounter (Signed)
Left message for pt to call back.  Pt states she is in the doughnut hole and her medication last month was 174.00. Pt is going to have trouble continuing this medication. Samples left up front for pt.

## 2014-02-02 ENCOUNTER — Other Ambulatory Visit: Payer: Self-pay | Admitting: Gastroenterology

## 2014-02-23 ENCOUNTER — Other Ambulatory Visit: Payer: Self-pay | Admitting: Cardiology

## 2014-03-02 ENCOUNTER — Other Ambulatory Visit: Payer: Self-pay | Admitting: Gastroenterology

## 2014-04-03 ENCOUNTER — Encounter: Payer: Self-pay | Admitting: Cardiology

## 2014-04-03 ENCOUNTER — Ambulatory Visit (INDEPENDENT_AMBULATORY_CARE_PROVIDER_SITE_OTHER): Payer: Medicare HMO | Admitting: Cardiology

## 2014-04-03 VITALS — BP 127/70 | HR 50 | Ht 65.0 in | Wt 145.0 lb

## 2014-04-03 DIAGNOSIS — I2510 Atherosclerotic heart disease of native coronary artery without angina pectoris: Secondary | ICD-10-CM

## 2014-04-03 MED ORDER — METOPROLOL SUCCINATE ER 25 MG PO TB24: 12.5000 mg | ORAL_TABLET | Freq: Every day | ORAL | Status: DC

## 2014-04-03 NOTE — Patient Instructions (Signed)
LAB WORK IN 1 WEEK; FASTING LIPID PANEL  DECREASE METOPROLOL TO 12.5 MG DAILY= 1/2 TAB DAILY  Your physician wants you to follow-up in: [place] will receive a reminder letter in the mail two months in advance. If you don't receive a letter, please call our office to schedule the follow-up appointment.

## 2014-04-03 NOTE — Progress Notes (Signed)
04/03/2014 [place]   [birth year]  [id number]  Primary Physician REDDING Angelique Blonder., MD Primary Cardiologist: Dr. Aundra Dubin  HPI:  [name] presents to clinic today for routine follow-up. She is a 73 year old female followed by Dr. Aundra Dubin. She has a history of HLD and non-STEMI in July 2012 and was found to have severe multivessel disease. Subsequently, she underwent CABG surgery (LIMA-LAD, SVG-D1, SVG-PDA). Systolic function at that time was normal at 60-65%. She also developed postoperative atrial fibrillation and was treated with Coumadin and amiodarone. Both medications were discontinued and she has done fairly well and has maintained normal sinus rhythm. Additional past medical history is significant for C. difficile colitis, which lead to a perforated sigmoid colon and ultimately underwent sigmoid colectomy with end colostomy [name] procedure) by Dr. [name]. She had her colostomy reversed in February 2013. Last office visit with Dr. Aundra Dubin was in January 2015. At that time she was felt to be stable from a cardiovascular standpoint. She was instructed to follow-up in one year for reevaluation.  Today in clinic, she states that she has been doing fairly well. She continues to have persistent hoarseness that first developed after undergoing CABG surgery in 2012. She sounds dyspneic when she talks has intermittent dyspnea. She was refered to pulmonology and underwent extensive testing including PFTs and imaging studies, all of which were unremarkable. She denies any dyspnea on exertion and no anginal like symptoms. She also denies, orthopnea, PND, lower extremity edema, weight gain, palpitations, lightheadedness, dizziness, seen last near-syncope. She does admit to frequent fatigue. She reports full medication compliance.     Current Outpatient Prescriptions  Medication Sig Dispense Refill  . APRISO 0.375 G 24 hr capsule TAKE FOUR CAPSULES BY MOUTH ONCE DAILY 120 capsule 0  .  Ascorbic Acid (VITAMIN C) 1000 MG tablet Take 1,000 mg by mouth daily.    Marland Kitchen aspirin 81 MG tablet Take 81 mg by mouth daily.    . Bacillus Coagulans-Inulin (PROBIOTIC-PREBIOTIC) 1-250 BILLION-MG CAPS Take 1 capsule by mouth daily.     . Calcium Carbonate-Vitamin D 600-400 MG-UNIT per tablet Take 1 tablet by mouth daily.     . Coenzyme Q10 200 MG TABS Take 1 tablet by mouth daily.   0  . magnesium oxide (MAG-OX) 400 MG tablet Take 1 tablet (400 mg total) by mouth daily. 30 tablet 6  . metoprolol succinate (TOPROL-XL) 25 MG 24 hr tablet Take 0.5 tablets (12.5 mg total) by mouth daily.    . Multiple Vitamin (MULTIVITAMIN) tablet Take 1 tablet by mouth daily.     . pravastatin (PRAVACHOL) 80 MG tablet Take 1 tablet (80 mg total) by mouth every evening. 90 tablet 4   No current facility-administered medications for this visit.    Allergies  Allergen Reactions  . Sulfa Antibiotics Hives  . Sulfonamide Derivatives Anaphylaxis  . Nadolol Hives    Rash and nausea  . Other Other (See Comments)    Apples-anaphylaxis  . Verapamil Other (See Comments)    seizures    History   Social History  . Marital Status: Married    Spouse Name: N/A    Number of Children: 1  . Years of Education: N/A   Occupational History  . LPN    Social History Main Topics  . Smoking status: Never Smoker   . Smokeless tobacco: Never Used  . Alcohol Use: No  . Drug Use: No  . Sexual Activity: Yes    Birth Control/ Protection: Post-menopausal  Other Topics Concern  . Not on file   Social History Narrative     Review of Systems: General: negative for chills, fever, night sweats or weight changes.  Cardiovascular: negative for chest pain, dyspnea on exertion, edema, orthopnea, palpitations, paroxysmal nocturnal dyspnea or shortness of breath Dermatological: negative for rash Respiratory: negative for cough or wheezing Urologic: negative for hematuria Abdominal: negative for nausea, vomiting, diarrhea,  bright red blood per rectum, melena, or hematemesis Neurologic: negative for visual changes, syncope, or dizziness All other systems reviewed and are otherwise negative except as noted above.    Blood pressure 127/70, pulse 50, height 5' 5"  (1.651 m), weight 145 lb (65.772 kg).  General appearance: alert, cooperative and no distress Neck: no carotid bruit and no JVD Lungs: clear to auscultation bilaterally Heart: regular rate and rhythm, S1, S2 normal, no murmur, click, rub or gallop Extremities: no LEE Pulses: 2+ and symmetric Skin: warm and dry Neurologic: Grossly normal  EKG sinus bradycardia. Heart rate 50 bpm  ASSESSMENT AND PLAN:   1. CAD: Status post CABG in 2012. She denies anginal symptoms. Continue medical therapy.  2. Atrial fibrillation: Occured postoperatively after CABG surgery. No further recurrence since that time. EKG today demonstrates sinus bradycardia. Continue beta blocker for rate control.  3. Hyperlipidemia: Continue statin therapy with pravastatin. She is due for repeat lipid panel. Will order for her to undergo a fasting lipid study. LFTs recently ordered by her PCP and hepatic function was normal (labs personally reviewed during office visit).  4. Sinus bradycardia: she notes frequent fatigue and heart rate is currently in the 50 bpm. Will decrease Metroprolol from 25 mg down to 12.5 mg daily.  5. Vocal hoarseness: Seen by pulmonology and no etiology was found after workup. Question if she has laryngeal damage from open-heart surgery?   PLAN  She appears to be to be doing well from a cardiovascular standpoint. Recommend that she follow-up with Dr. Aundra Dubin in one year for repeat evaluation, or sooner if needed.  SIMMONS, BRITTAINYPA-C 04/03/2014 1:27 PM

## 2014-04-06 ENCOUNTER — Other Ambulatory Visit: Payer: Self-pay | Admitting: Gastroenterology

## 2014-04-07 ENCOUNTER — Encounter: Payer: Self-pay | Admitting: Gastroenterology

## 2014-04-07 ENCOUNTER — Ambulatory Visit (INDEPENDENT_AMBULATORY_CARE_PROVIDER_SITE_OTHER): Payer: Medicare HMO | Admitting: Gastroenterology

## 2014-04-07 VITALS — BP 130/70 | HR 65 | Ht 66.0 in | Wt 147.0 lb

## 2014-04-07 DIAGNOSIS — K5190 Ulcerative colitis, unspecified, without complications: Secondary | ICD-10-CM

## 2014-04-07 NOTE — Patient Instructions (Signed)
Please return to see Dr. Ardis Hughs in 6 months, call sooner if needed. We will check on refills for apriso (2 pills twice daily, one month with 11 refills)

## 2014-04-07 NOTE — Progress Notes (Signed)
Review of pertinent gastrointestinal problems:  1. C. Diff coltis, eventually perforated sigmoid, s/p segmental colectomy, reversed 2/13.  2. 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 She underwent 3-4 sequential sigmoidoscopies with dilation of the stricture. Eventually dilated up to 20 mm with very good results in her diarrhea. Last examination was August 2013, this was a full colonoscopy. No polyps were seen. She did have proximal to the site of the previous stricture, the colon was somewhat edematous.  3. routine risk for colon cancer, next colonoscopy August 2023  4. Chronic colitis, July 2014 colonoscopy (mildly inflamed colon throughout, biopsies confirmed chronic inflammation). This was done for diarrhea. CC anastomosis at the time was open to 7m, repeat dilation performed. Pred 236mdaily was started. Hot flashes, racing heart and so changed to uceris 83m483mer day. Sept 2014; started mesalamine 4.8 grams per day,  with good response  HPI: This is a  very pleasant 73 37ar old woman whom I last saw 6 or 7 months ago.  Feeling not too bad.  Periodic bouts of loose stools.  These will occur maybe twice per week.  Still taking apriso 2 pills twice daily.  No bleeding.  She is overall fairly content with the way her bowels are.    Past Medical History  Diagnosis Date  . Coronary artery disease     a. NSTEMI 7/12, cath: pLAD 30%, mLAD 60-70%, dLAD 80%, oOM2 50%, mOM2 40%, trifurcation 80%, pRCA 25%, then occluded, L-R collats, EF 55%;   b. 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 OweRoxy Manns. Hyperlipidemia     takes Pravastatin daily  . Pericarditis 1980    hx of  . GERD (gastroesophageal reflux disease)   . Palpitations 2003    hx of; Holter in 2003 with PACs and PVCs  . Atrial fibrillation     post-op after CABG;  treated with Amiodarone and coumadin until readmxn with CDiff colitis, perf colon and elevated LFTs (amio and  coumadin stopped)  . C. difficile colitis 01/09/3219 complicated post CABG course with prolonged admission, perforated sigmoid colon;  s/p sigmoid colectomy with end colostomy (HaJeanette Capriceocedure);  laparotomy wound infection with Pseudomonas treated with antibxs and secondary intention  . 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 Surgical wound infection     Pseudomonas; wound VAC; secondary intention  . 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 Hair loss     after heart surgery  . Hypertension     takes Metoprolol daily  . Mitral valve prolapse     was told this in the late 1990's; ;then with Dr.Mclean he states that she doesn't  . Arthritis     neck and back  . PONV (postoperative nausea and vomiting)     w/appy  . Complication of anesthesia 12/2010    "had trouble waking me up after colon OR"  . Myocardial infarction 11/2010  . History of bronchitis     late 1980's  . Anemia     "in my early teens"  . H/O hiatal hernia     "slight; not having problems with it"  . Seizures     "w/verapimil"  . Hemorrhoids     "none since hemorrhoid OR"  . Chronic kidney disease     hx kidney stones    Past Surgical History  Procedure Laterality Date  . Oophorectomy  2005  left; w/"mass removal"  . Hemorrhoid surgery  ~ 2005    x 2  . Sigmoidectomy  12/18/2010    with end colostomy  . Appendectomy      as a child  . Colonoscopy    . Esophagogastroduodenoscopy    . Tubal ligation    . Colostomy reversal  06/27/11  . Coronary artery bypass graft  11-25-2010    CABG X3; LIMA to LAD, SVG to PDA, SVG to OM, EVH via right thigh  . Cardiac catheterization  11/21/10  . Proctoscopy  06/27/2011    Procedure: PROCTOSCOPY;  Surgeon: Gayland Curry, MD;  Location: [facility];  Service: General;  Laterality: N/A;  . Colostomy reconnect    . Flexible sigmoidoscopy  11/09/2011    Procedure: FLEXIBLE SIGMOIDOSCOPY;  Surgeon: Milus Banister, MD;  Location: [facility];   Service: Endoscopy;  Laterality: N/A;  . Cataract extraction, bilateral      Current Outpatient Prescriptions  Medication Sig Dispense Refill  . APRISO 0.375 G 24 hr capsule TAKE FOUR CAPSULES BY MOUTH ONCE DAILY 120 capsule 0  . Ascorbic Acid (VITAMIN C) 1000 MG tablet Take 1,000 mg by mouth daily.    Marland Kitchen aspirin 81 MG tablet Take 81 mg by mouth daily.    . Bacillus Coagulans-Inulin (PROBIOTIC-PREBIOTIC) 1-250 BILLION-MG CAPS Take 1 capsule by mouth daily.     . Calcium Carbonate-Vitamin D 600-400 MG-UNIT per tablet Take 1 tablet by mouth daily.     . Coenzyme Q10 200 MG TABS Take 1 tablet by mouth daily.   0  . magnesium oxide (MAG-OX) 400 MG tablet Take 1 tablet (400 mg total) by mouth daily. 30 tablet 6  . metoprolol succinate (TOPROL-XL) 25 MG 24 hr tablet Take 0.5 tablets (12.5 mg total) by mouth daily.    . Multiple Vitamin (MULTIVITAMIN) tablet Take 1 tablet by mouth daily.     . pravastatin (PRAVACHOL) 80 MG tablet Take 1 tablet (80 mg total) by mouth every evening. 90 tablet 4   No current facility-administered medications for this visit.    Allergies as of 04/07/2014 - Review Complete 04/07/2014  Allergen Reaction Noted  . Sulfa antibiotics Hives 11/20/2011  . Sulfonamide derivatives Anaphylaxis   . Nadolol Hives 08/02/2009  . Other Other (See Comments) 06/22/2011  . Verapamil Other (See Comments)     Family History  Problem Relation Age of Onset  . Heart attack Father 68    died  . Heart disease Father     massive heart attack  . Coronary artery disease Mother 39    [place]  . Heart disease Mother   . Anesthesia problems Neg Hx   . Hypotension Neg Hx   . Malignant hyperthermia Neg Hx   . Pseudochol deficiency Neg Hx   . Colon cancer Neg Hx   . Esophageal cancer Neg Hx   . Stomach cancer Neg Hx   . Rectal cancer Neg Hx     History   Social History  . Marital Status: Married    Spouse Name: N/A    Number of Children: 1  . Years of  Education: N/A   Occupational History  . LPN    Social History Main Topics  . Smoking status: Never Smoker   . Smokeless tobacco: Never Used  . Alcohol Use: No  . Drug Use: No  . Sexual Activity: Yes    Birth Control/ Protection: Post-menopausal   Other Topics Concern  . Not  on file   Social History Narrative      Physical Exam: BP 130/70 mmHg  Pulse 65  Ht 5' 6"  (1.676 m)  Wt 147 lb (66.679 kg)  BMI 23.74 kg/m2  SpO2 99% Constitutional: generally well-appearing Psychiatric: alert and oriented x3 Abdomen: soft, nontender, nondistended, no obvious ascites, no peritoneal signs, normal bowel sounds     Assessment and plan: 73 y.o. female with colitis  She has intermittent episodes of diarrhea approximately 2-3 times per month. This may be from previously noted stricture. She is certainly much better than before she started the mesalamine. I offered repeat flexible sigmoidoscopy to evaluate the stricture, dilated needed but she is just not bothered enough by her bowel habits to repeat that now. She will return to see me in 6 months and sooner if needed.

## 2014-04-13 ENCOUNTER — Other Ambulatory Visit (INDEPENDENT_AMBULATORY_CARE_PROVIDER_SITE_OTHER): Payer: Medicare HMO | Admitting: *Deleted

## 2014-04-13 DIAGNOSIS — I2510 Atherosclerotic heart disease of native coronary artery without angina pectoris: Secondary | ICD-10-CM

## 2014-04-13 LAB — LIPID PANEL
CHOL/HDL RATIO: 3
CHOLESTEROL: 174 mg/dL (ref 0–200)
HDL: 51.2 mg/dL (ref >39.00)
LDL CALC: 98 mg/dL (ref 0–99)
NONHDL: 122.8
Triglycerides: 123 mg/dL (ref 0.0–149.0)
VLDL: 24.6 mg/dL (ref 0.0–40.0)

## 2014-05-04 ENCOUNTER — Other Ambulatory Visit: Payer: Self-pay | Admitting: Gastroenterology

## 2014-05-22 ENCOUNTER — Ambulatory Visit: Payer: Medicare HMO | Admitting: Cardiology

## 2014-06-03 ENCOUNTER — Other Ambulatory Visit: Payer: Self-pay | Admitting: Gastroenterology

## 2014-06-30 ENCOUNTER — Other Ambulatory Visit: Payer: Self-pay | Admitting: Gastroenterology

## 2014-07-18 ENCOUNTER — Encounter: Payer: Self-pay | Admitting: Gastroenterology

## 2014-08-01 ENCOUNTER — Other Ambulatory Visit: Payer: Self-pay | Admitting: Gastroenterology

## 2014-08-03 NOTE — Telephone Encounter (Signed)
Due an office visit in May 2016

## 2014-08-28 ENCOUNTER — Other Ambulatory Visit: Payer: Self-pay | Admitting: Cardiology

## 2014-10-06 ENCOUNTER — Encounter: Payer: Self-pay | Admitting: Gastroenterology

## 2014-10-06 ENCOUNTER — Ambulatory Visit (INDEPENDENT_AMBULATORY_CARE_PROVIDER_SITE_OTHER): Payer: Medicare HMO | Admitting: Gastroenterology

## 2014-10-06 VITALS — BP 132/60 | HR 64 | Ht 65.0 in | Wt 144.8 lb

## 2014-10-06 DIAGNOSIS — K5190 Ulcerative colitis, unspecified, without complications: Secondary | ICD-10-CM | POA: Diagnosis not present

## 2014-10-06 MED ORDER — MESALAMINE ER 0.375 G PO CP24: 750.0000 mg | ORAL_CAPSULE | Freq: Two times a day (BID) | ORAL | Status: DC

## 2014-10-06 NOTE — Patient Instructions (Addendum)
Will refill apriso (2 pills twice daily, 120 pills, 11 refills). Will print script for you. Please return to see Dr. Ardis Hughs in 1 year. Sooner if needed.

## 2014-10-06 NOTE — Progress Notes (Signed)
Review of pertinent gastrointestinal problems:  1. C. Diff coltis, eventually perforated sigmoid, s/p segmental colectomy, reversed 2/13.  2. 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 She underwent 3-4 sequential sigmoidoscopies with dilation of the stricture. Eventually dilated up to 20 mm with very good results in her diarrhea. Last examination was August 2013, this was a full colonoscopy. No polyps were seen. She did have proximal to the site of the previous stricture, the colon was somewhat edematous.  3. routine risk for colon cancer, next colonoscopy August 2023  4. Chronic colitis, July 2014 colonoscopy (mildly inflamed colon throughout, biopsies confirmed chronic inflammation). This was done for diarrhea. CC anastomosis at the time was open to 30m, repeat dilation performed. Pred 263mdaily was started. Hot flashes, racing heart and so changed to uceris 31m23mer day. Sept 2014; started mesalamine 4.8 grams per day, with good response   HPI: This is a  very pleasant 74 71ar old woman whom I last saw 6 months ago   Chief complaint is colitis, intermittent loose stools  Still has occasional loose stools. Worse lately with a lot of fruit.  No bleeding.  NO significant abd pains.  No vomiting, nausea  Pretty content with the way her bowels are working  [employer].  She wants to try a new pharmacy to see if it is cheaper since she believes she will be entering the "doughnut hole"  She is overall MUCH better than when we were dilating her stricture.   Past Medical History  Diagnosis Date  . Coronary artery disease     a. NSTEMI 7/12, cath: pLAD 30%, mLAD 60-70%, dLAD 80%, oOM2 50%, mOM2 40%, trifurcation 80%, pRCA 25%, then occluded, L-R collats, EF 55%;   b. 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 OweRoxy Manns. Hyperlipidemia     takes Pravastatin daily  . Pericarditis 1980    hx of  . GERD (gastroesophageal  reflux disease)   . Palpitations 2003    hx of; Holter in 2003 with PACs and PVCs  . Atrial fibrillation     post-op after CABG;  treated with Amiodarone and coumadin until readmxn with CDiff colitis, perf colon and elevated LFTs (amio and coumadin stopped)  . C. difficile colitis 01/08/9628 complicated post CABG course with prolonged admission, perforated sigmoid colon;  s/p sigmoid colectomy with end colostomy (HaJeanette Capriceocedure);  laparotomy wound infection with Pseudomonas treated with antibxs and secondary intention  . 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 Surgical wound infection     Pseudomonas; wound VAC; secondary intention  . 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 Hair loss     after heart surgery  . Hypertension     takes Metoprolol daily  . Mitral valve prolapse     was told this in the late 1990's; ;then with Dr.Mclean he states that she doesn't  . Arthritis     neck and back  . PONV (postoperative nausea and vomiting)     w/appy  . Complication of anesthesia 12/2010    "had trouble waking me up after colon OR"  . Myocardial infarction 11/2010  . History of bronchitis     late 1980's  . Anemia     "in my early teens"  . H/O hiatal hernia     "slight; not having problems with it"  . Seizures     "  w/verapimil"  . Hemorrhoids     "none since hemorrhoid OR"  . Chronic kidney disease     hx kidney stones    Past Surgical History  Procedure Laterality Date  . Oophorectomy  2005    left; w/"mass removal"  . Hemorrhoid surgery  ~ 2005    x 2  . Sigmoidectomy  12/18/2010    with end colostomy  . Appendectomy      as a child  . Colonoscopy    . Esophagogastroduodenoscopy    . Tubal ligation    . Colostomy reversal  06/27/11  . Coronary artery bypass graft  11-25-2010    CABG X3; LIMA to LAD, SVG to PDA, SVG to OM, EVH via right thigh  . Cardiac catheterization  11/21/10  . Proctoscopy  06/27/2011    Procedure: PROCTOSCOPY;   Surgeon: Gayland Curry, MD;  Location: [facility];  Service: General;  Laterality: N/A;  . Colostomy reconnect    . Flexible sigmoidoscopy  11/09/2011    Procedure: FLEXIBLE SIGMOIDOSCOPY;  Surgeon: Milus Banister, MD;  Location: [facility];  Service: Endoscopy;  Laterality: N/A;  . Cataract extraction, bilateral      Current Outpatient Prescriptions  Medication Sig Dispense Refill  . APRISO 0.375 G 24 hr capsule TAKE FOUR CAPSULES BY MOUTH ONCE DAILY 120 capsule 2  . Ascorbic Acid (VITAMIN C) 1000 MG tablet Take 1,000 mg by mouth daily.    Marland Kitchen aspirin 81 MG tablet Take 81 mg by mouth daily.    . Bacillus Coagulans-Inulin (PROBIOTIC-PREBIOTIC) 1-250 BILLION-MG CAPS Take 1 capsule by mouth daily.     . Calcium Carbonate-Vitamin D 600-400 MG-UNIT per tablet Take 1 tablet by mouth daily.     . Coenzyme Q10 200 MG TABS Take 1 tablet by mouth daily.   0  . magnesium oxide (MAG-OX) 400 MG tablet Take 1 tablet (400 mg total) by mouth daily. 30 tablet 6  . metoprolol succinate (TOPROL-XL) 25 MG 24 hr tablet Take 0.5 tablets (12.5 mg total) by mouth daily. (Patient taking differently: Take 25 mg by mouth daily. )    . Multiple Vitamin (MULTIVITAMIN) tablet Take 1 tablet by mouth daily.     . pravastatin (PRAVACHOL) 80 MG tablet TAKE ONE TABLET BY MOUTH ONCE DAILY IN THE EVENING 90 tablet 3   No current facility-administered medications for this visit.    Allergies as of 10/06/2014 - Review Complete 10/06/2014  Allergen Reaction Noted  . Sulfa antibiotics Hives 11/20/2011  . Sulfonamide derivatives Anaphylaxis   . Nadolol Hives 08/02/2009  . Other Other (See Comments) 06/22/2011  . Verapamil Other (See Comments)     Family History  Problem Relation Age of Onset  . Heart attack Father 49    died  . Heart disease Father     massive heart attack  . Coronary artery disease Mother 21    [place]  . Heart disease Mother   . Anesthesia problems Neg Hx   . Hypotension Neg Hx   .  Malignant hyperthermia Neg Hx   . Pseudochol deficiency Neg Hx   . Colon cancer Neg Hx   . Esophageal cancer Neg Hx   . Stomach cancer Neg Hx   . Rectal cancer Neg Hx     History   Social History  . Marital Status: Married    Spouse Name: N/A  . Number of Children: 1  . Years of Education: N/A   Occupational History  .  LPN    Social History Main Topics  . Smoking status: Never Smoker   . Smokeless tobacco: Never Used  . Alcohol Use: No  . Drug Use: No  . Sexual Activity: Yes    Birth Control/ Protection: Post-menopausal   Other Topics Concern  . Not on file   Social History Narrative     Physical Exam: BP 132/60 mmHg  Pulse 64  Ht 5' 5"  (1.651 m)  Wt 144 lb 12.8 oz (65.681 kg)  BMI 24.10 kg/m2 Constitutional: generally well-appearing Psychiatric: alert and oriented x3 Abdomen: soft, nontender, nondistended, no obvious ascites, no peritoneal signs, normal bowel sounds   Assessment and plan: 74 y.o. female with ulcerative colitis, history of sigmoid stricture anastomotic  She is pretty content with the way her bowels are working lately. Clearly much improved from 2-3 years ago when we battled her anastomotic stricture area she will continue on mesalamine full dose. 2 pills of apriso twice daily.  She will return to see me in 12 months and sooner if needed. I wrote her prescription for refills of her colitis medicine.   Owens Loffler, MD [facility] 10/06/2014, 9:47 AM

## 2015-03-10 DIAGNOSIS — J385 Laryngeal spasm: Secondary | ICD-10-CM | POA: Insufficient documentation

## 2015-03-25 ENCOUNTER — Other Ambulatory Visit: Payer: Self-pay | Admitting: Cardiology

## 2015-05-03 ENCOUNTER — Encounter: Payer: Self-pay | Admitting: Cardiology

## 2015-05-03 ENCOUNTER — Ambulatory Visit (INDEPENDENT_AMBULATORY_CARE_PROVIDER_SITE_OTHER): Payer: Medicare HMO | Admitting: Cardiology

## 2015-05-03 VITALS — BP 146/68 | HR 46 | Ht 65.0 in | Wt 142.8 lb

## 2015-05-03 DIAGNOSIS — I480 Paroxysmal atrial fibrillation: Secondary | ICD-10-CM

## 2015-05-03 DIAGNOSIS — I2510 Atherosclerotic heart disease of native coronary artery without angina pectoris: Secondary | ICD-10-CM

## 2015-05-03 DIAGNOSIS — R0600 Dyspnea, unspecified: Secondary | ICD-10-CM

## 2015-05-03 DIAGNOSIS — E785 Hyperlipidemia, unspecified: Secondary | ICD-10-CM

## 2015-05-03 MED ORDER — RAMIPRIL 2.5 MG PO CAPS: 2.5000 mg | ORAL_CAPSULE | Freq: Every day | ORAL | Status: DC

## 2015-05-03 NOTE — Patient Instructions (Signed)
Medication Instructions:  Stop metoprolol.  Start ramipril 2.90m daily  Labwork: Lipid profile today.  Your physician recommends that you have lab in 2 weeks--BMET. I have given you an order for this.  Please fax the results to [phone]   Testing/Procedures: Your physician has recommended that you have a cardiopulmonary stress test (CPX). CPX testing is a non-invasive measurement of heart and lung function. It replaces a traditional treadmill stress test. This type of test provides a tremendous amount of information that relates not only to your present condition but also for future outcomes. This test combines measurements of you ventilation, respiratory gas exchange in the lungs, electrocardiogram (EKG), blood pressure and physical response before, during, and following an exercise protocol.    Follow-Up: Your physician wants you to follow-up in: 1 year with Dr MAundra Dubin (December 2017). You will receive a reminder letter in the mail two months in advance. If you don't receive a letter, please call our office to schedule the follow-up appointment.        If you need a refill on your cardiac medications before your next appointment, please call your pharmacy.

## 2015-05-04 ENCOUNTER — Telehealth: Payer: Self-pay | Admitting: Cardiology

## 2015-05-04 NOTE — Progress Notes (Signed)
Patient ID: [name], female   DOB: 06/01/1940, 74 y.o.   MRN: [id number] PCP: Dr. Dena Billet  74 yo presented to [facility] in 7/12 with chest pain.  She ruled in for NSTEMI.  Cardiac catheterization 7/12: Proximal LAD 30%, mid LAD 60-70%, distal LAD 80%, ostial OM2 50%, mid OM2 40%, 80% at the trifurcation, proximal RCA 25% then occluded with left to right collaterals, EF 55%.  Echocardiogram 7/12: EF 60-65%, PASP 45.  She was referred for bypass surgery.  Grafts included a LIMA-LAD, SVG-D1, SVG-PDA.  Of note, she did have an endarterectomy of the OM.  She developed postoperative atrial fibrillation and was treated with Coumadin and amiodarone.  She maintained normal sinus rhythm with this.  Unfortunately, she developed C. Difficile colitis.  She improved and was eventually discharged home in 7/12.  She was readmitted 8/3-8/18 with failure to thrive, anasarca and profound diarrhea.  She was placed on IV vancomycin.  She was diuresed for anasarca.  Relook echocardiogram demonstrated normal LV function.  CT scan demonstrated a perforated sigmoid colon and she was referred for exploratory laparotomy.  She underwent sigmoid colectomy with end colostomy [name] procedure) by Dr. [name].  She had her colostomy reversed in 2/13.  Finally, in 2014 she was diagnosed with ulcerative colitis and started on mesalamine.   She continues to have trouble with her vocal cords "fluttering" and has been getting vocal cord injections from an ENT in [place].  No chest pain.  Occasional palpitations.  She does still tire easily and gets short of breath walking on a treadmill more than about 5 minutes.  She generally does ok around the house.  This is a long-term pattern.  She saw pulmonlogy with no explanation for her dyspnea.    Labs (9/12): K 4.7, creatinine 0.6, LFTs normal, HCT 33.2 Labs (3/13): LDL 82, HDL 52, K 4.1, creatinine 0.8, LDL 74, HDL 66 Labs (1/14): LDL 96, HDL 69, K 4.1, creatinine  0.9 Labs (11/15): LDL 98, HDL 51  ECG: NSR at 46 with rightward axis.    Past Medical History  Diagnosis Date  . Coronary artery disease     a. NSTEMI 7/12, cath: pLAD 30%, mLAD 60-70%, dLAD 80%, oOM2 50%, mOM2 40%, trifurcation 80%, pRCA 25%, then occluded, L-R collats, EF 55%;   b. 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 Roxy Manns)  . Hyperlipidemia     takes Pravastatin daily  . Pericarditis 1980    hx of  . GERD (gastroesophageal reflux disease)   . Palpitations 2003    hx of; Holter in 2003 with PACs and PVCs  . Atrial fibrillation ([place])     post-op after CABG;  treated with Amiodarone and coumadin until readmxn with CDiff colitis, perf colon and elevated LFTs (amio and coumadin stopped)  . C. difficile colitis 11/8674    complicated post CABG course with prolonged admission, perforated sigmoid colon;  s/p sigmoid colectomy with end colostomy [name] procedure);  laparotomy wound infection with Pseudomonas treated with antibxs and secondary intention  . S/P colostomy ([place])     due to perf sigmoid colon in setting of CDiff colitis  . Surgical wound infection     Pseudomonas; wound VAC; secondary intention  . 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 Hair loss     after heart surgery  . Hypertension     takes Metoprolol daily  . Mitral valve prolapse     was told this in  the late 1990's; ;then with Dr.Larisa Lanius he states that she doesn't  . Arthritis     neck and back  . PONV (postoperative nausea and vomiting)     w/appy  . Complication of anesthesia 12/2010    "had trouble waking me up after colon OR"  . Myocardial infarction ([place]) 11/2010  . History of bronchitis     late 1980's  . Anemia     "in my early teens"  . H/O hiatal hernia     "slight; not having problems with it"  . Seizures ([place])     "w/verapimil"  . Hemorrhoids     "none since hemorrhoid OR"  . Chronic kidney disease     hx kidney stones  - Ulcerative colitis  Current Outpatient Prescriptions   Medication Sig Dispense Refill  . Ascorbic Acid (VITAMIN C) 1000 MG tablet Take 1,000 mg by mouth daily.    Marland Kitchen aspirin 81 MG tablet Take 81 mg by mouth daily.    . Bacillus Coagulans-Inulin (PROBIOTIC-PREBIOTIC) 1-250 BILLION-MG CAPS Take 1 capsule by mouth daily.     . Calcium Carbonate-Vitamin D 600-400 MG-UNIT per tablet Take 1 tablet by mouth daily.     . Coenzyme Q10 200 MG TABS Take 1 tablet by mouth daily.   0  . magnesium oxide (MAG-OX) 400 MG tablet Take 1 tablet (400 mg total) by mouth daily. 30 tablet 6  . mesalamine (APRISO) 0.375 G 24 hr capsule Take 2 capsules (0.75 g total) by mouth 2 (two) times daily. 120 capsule 11  . metoprolol succinate (TOPROL-XL) 25 MG 24 hr tablet Take 0.5 tablets (12.5 mg total) by mouth daily. 45 tablet 0  . Multiple Vitamin (MULTIVITAMIN) tablet Take 1 tablet by mouth daily.     . Omega-3 1000 MG CAPS Take 1 g by mouth daily.     . pravastatin (PRAVACHOL) 80 MG tablet TAKE ONE TABLET BY MOUTH ONCE DAILY IN THE EVENING 90 tablet 3  . ramipril (ALTACE) 2.5 MG capsule Take 1 capsule (2.5 mg total) by mouth daily. 90 capsule 3   No current facility-administered medications for this visit.    Allergies: Allergies  Allergen Reactions  . Nadolol Hives and Anaphylaxis    Rash and nausea  . Other Other (See Comments) and Anaphylaxis    apples Apples-anaphylaxis  . Sulfa Antibiotics Hives  . Sulfamethoxazole Anaphylaxis  . Sulfonamide Derivatives Anaphylaxis  . 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  Nonsmoker.   Family History  Problem Relation Age of Onset  . Heart attack Father 33    died  . Heart disease Father     massive heart attack  . Coronary artery disease Mother 9    [place]  . Heart disease Mother   . Anesthesia problems Neg Hx   . Hypotension Neg Hx   . Malignant hyperthermia Neg Hx   . Pseudochol deficiency Neg Hx   . Colon cancer Neg Hx    . Esophageal cancer Neg Hx   . Stomach cancer Neg Hx   . Rectal cancer Neg Hx    ROS: All systems reviewed and negative except as per HPI.   Vital Signs: BP 146/68 mmHg  Pulse 46  Ht 5' 5"  (1.651 m)  Wt 142 lb 12.8 oz (64.774 kg)  BMI 23.76 kg/m2  PHYSICAL EXAM: General: Well nourished, well developed, in no acute distress HEENT: normal Neck:  no JVD At 90 Cardiac:  normal S1, S2; RRR; No murmur.  Lungs:  clear to auscultation bilaterally, no wheezing, rhonchi or rales MKJ:IZXY, nontender, no hepatosplenomegaly Ext: no edema Skin: warm and dry Psych: Normal affect  Assesssment/Plan:  Atrial fibrillation Patient had post-operative atrial fibrillation with no documented recurrence. She is in sinus rhythm today. 3-week monitor in 3/13 showed no atrial fibrillation.  Plan anticoagulation if recurrent atrial fibrillation is noted.  CAD  Status post CABG. EF preserved. She is on ASA 81, pravastatin, and Toprol XL.  No chest pain.  Given bradycardia, I am going to have her stop metoprolol and start on ramipril 2.5 mg daily with BMET in 2 wks.  HYPERLIPIDEMIA Patient unable to tolerate Crestor due to leg weakness. She has done well so far with pravastatin. LDL was above goal in 11/15.  I will check lipids today.  If LDL too high, adding Zetia would be an option.   Dyspnea with exertion She is not volume overloaded on exam.  Doubt ischemia.  She saw pulmonary with no explanation.  This possibly represents deconditioning.  I am going to arrange for CPX to evaluate more closely.   Loralie Champagne 05/04/2015

## 2015-05-04 NOTE — Telephone Encounter (Signed)
LMTCB

## 2015-05-04 NOTE — Telephone Encounter (Signed)
Probably would be fine with it and it is a good med for people who have CAD.  If she is concerned about it, she can take amlodipine 2.5 mg daily.

## 2015-05-04 NOTE — Telephone Encounter (Signed)
Discussed with patient. Pt is going to talk with her granddaughter-in -law, a pharmacist, and make a decision about what she wants to do about her medication.

## 2015-05-04 NOTE — Telephone Encounter (Signed)
Pt has a prescription for ramipril at her pharmacy. If pt decides she wants to take amlodipine she will need a prescription sent in to her pharmacy.

## 2015-05-04 NOTE — Telephone Encounter (Signed)
Pt states she is concerned about the general side effects of ramipril and is hesitant to begin taking it. Pt states she is especially concerned about one of the potential side effect of loose stools since she already has a problem with that and does not want to make it worse.   Pt advised I will forward to Dr Aundra Dubin for review.

## 2015-05-04 NOTE — Telephone Encounter (Signed)
New problem   Pt has a question about the side effects of the medication Dr Aundra Dubin gave her. Please call pt.

## 2015-05-19 DIAGNOSIS — E785 Hyperlipidemia, unspecified: Secondary | ICD-10-CM | POA: Diagnosis not present

## 2015-05-19 DIAGNOSIS — R0600 Dyspnea, unspecified: Secondary | ICD-10-CM | POA: Diagnosis not present

## 2015-05-21 ENCOUNTER — Telehealth: Payer: Self-pay | Admitting: Cardiology

## 2015-05-21 NOTE — Telephone Encounter (Signed)
New Message  Pt request a call back to discuss her blood work.

## 2015-05-21 NOTE — Telephone Encounter (Signed)
Pt asking if lab results from Dr Lin Landsman done 05/19/15 have been received by Dr Orpah Greek advised have not received lab results will check first of week.

## 2015-05-25 ENCOUNTER — Encounter: Payer: Self-pay | Admitting: Cardiology

## 2015-05-25 NOTE — Telephone Encounter (Signed)
Pt aware lab results here for Dr Aundra Dubin to review.

## 2015-05-25 NOTE — Telephone Encounter (Signed)
Follow up  Pt returned the call

## 2015-05-25 NOTE — Telephone Encounter (Signed)
LM at Dr Redding's office with request for copy of lab.

## 2015-05-25 NOTE — Telephone Encounter (Signed)
Lab results done 05/19/15 received/in Dr Claris Gladden In [facility] for review.  LMTCB for pt to let her know lab results received.

## 2015-05-27 DIAGNOSIS — I10 Essential (primary) hypertension: Secondary | ICD-10-CM | POA: Diagnosis not present

## 2015-05-27 DIAGNOSIS — Z9181 History of falling: Secondary | ICD-10-CM | POA: Diagnosis not present

## 2015-05-27 DIAGNOSIS — Z1389 Encounter for screening for other disorder: Secondary | ICD-10-CM | POA: Diagnosis not present

## 2015-05-27 DIAGNOSIS — E7800 Pure hypercholesterolemia, unspecified: Secondary | ICD-10-CM | POA: Diagnosis not present

## 2015-05-28 NOTE — Telephone Encounter (Signed)
Spoke with patient about recent lab results 

## 2015-05-28 NOTE — Telephone Encounter (Signed)
Follow Up ° °Pt returned call//  °

## 2015-05-28 NOTE — Telephone Encounter (Signed)
LMTCB

## 2015-06-14 ENCOUNTER — Ambulatory Visit (HOSPITAL_COMMUNITY): Payer: PPO | Attending: Cardiology

## 2015-06-14 DIAGNOSIS — R0609 Other forms of dyspnea: Secondary | ICD-10-CM | POA: Diagnosis not present

## 2015-06-14 DIAGNOSIS — R0600 Dyspnea, unspecified: Secondary | ICD-10-CM | POA: Diagnosis not present

## 2015-06-14 DIAGNOSIS — I2510 Atherosclerotic heart disease of native coronary artery without angina pectoris: Secondary | ICD-10-CM

## 2015-06-14 DIAGNOSIS — I480 Paroxysmal atrial fibrillation: Secondary | ICD-10-CM

## 2015-06-14 DIAGNOSIS — E785 Hyperlipidemia, unspecified: Secondary | ICD-10-CM

## 2015-06-17 ENCOUNTER — Telehealth: Payer: Self-pay | Admitting: Cardiology

## 2015-06-17 DIAGNOSIS — Z0000 Encounter for general adult medical examination without abnormal findings: Secondary | ICD-10-CM | POA: Diagnosis not present

## 2015-06-17 DIAGNOSIS — E559 Vitamin D deficiency, unspecified: Secondary | ICD-10-CM | POA: Diagnosis not present

## 2015-06-17 DIAGNOSIS — M1990 Unspecified osteoarthritis, unspecified site: Secondary | ICD-10-CM | POA: Diagnosis not present

## 2015-06-17 DIAGNOSIS — I259 Chronic ischemic heart disease, unspecified: Secondary | ICD-10-CM | POA: Diagnosis not present

## 2015-06-17 DIAGNOSIS — K5190 Ulcerative colitis, unspecified, without complications: Secondary | ICD-10-CM | POA: Diagnosis not present

## 2015-06-17 DIAGNOSIS — I10 Essential (primary) hypertension: Secondary | ICD-10-CM | POA: Diagnosis not present

## 2015-06-17 DIAGNOSIS — Z1389 Encounter for screening for other disorder: Secondary | ICD-10-CM | POA: Diagnosis not present

## 2015-06-17 DIAGNOSIS — R5383 Other fatigue: Secondary | ICD-10-CM | POA: Diagnosis not present

## 2015-06-17 DIAGNOSIS — R0989 Other specified symptoms and signs involving the circulatory and respiratory systems: Secondary | ICD-10-CM | POA: Diagnosis not present

## 2015-06-17 NOTE — Telephone Encounter (Signed)
New message      Calling to get test results from Monday at the hosp

## 2015-06-17 NOTE — Telephone Encounter (Signed)
Discussed test results with pt. Pt is going to take and record her BP for 7-10 days then call our office with the readings.

## 2015-06-23 ENCOUNTER — Telehealth: Payer: Self-pay | Admitting: Cardiology

## 2015-06-23 DIAGNOSIS — I2510 Atherosclerotic heart disease of native coronary artery without angina pectoris: Secondary | ICD-10-CM

## 2015-06-23 DIAGNOSIS — I10 Essential (primary) hypertension: Secondary | ICD-10-CM

## 2015-06-23 DIAGNOSIS — I480 Paroxysmal atrial fibrillation: Secondary | ICD-10-CM

## 2015-06-23 DIAGNOSIS — R0600 Dyspnea, unspecified: Secondary | ICD-10-CM

## 2015-06-23 DIAGNOSIS — E785 Hyperlipidemia, unspecified: Secondary | ICD-10-CM

## 2015-06-23 MED ORDER — RAMIPRIL 2.5 MG PO CAPS: 5.0000 mg | ORAL_CAPSULE | Freq: Every day | ORAL | Status: DC

## 2015-06-23 NOTE — Telephone Encounter (Signed)
Pt reporting BP measurements the last week 2/2 150/76 @ 7am;; 138/68 @ 315pm 2/3 140/64 2/4 132/66 2/5 140/80  2/6 160/71 RT;; 158/75 LFT;; at home later that day 142/70 2/7 140/66 2/8 144/72

## 2015-06-23 NOTE — Telephone Encounter (Signed)
Increase ramipril to 5 mg daily with BMET in 2 wks.  Call in BP readings on higher dose ramipril.

## 2015-06-23 NOTE — Telephone Encounter (Signed)
Patient understands and repeats correctly to increase her ramipril 2.5 mg to 5 mg by taking two a day. Patient very concerned about her stress test results having being stopped because of hypertensive episode She walks on a treadmill every day at a gym and she experiences palpitations, dizziness and shortness of breath. Patient said that she is thinking about changing doctors because she is feeling so bad and Dr. Aundra Dubin isn't doing anything for her.  Advised not to use treadmill with these symptoms.  Will route to Dr. Aundra Dubin Last OV 12/20.  Next planned  OV not in visit notes

## 2015-06-23 NOTE — Telephone Encounter (Signed)
Her cardiopulmonary exercise test showed no definite cardiac or pulmonary abnormality. She did have a hypertensive response to exercise, which is what we are trying to treat with medications. She had PVCs noted on the CPX which are likely causing her palpitations and which can be treated with beta blocker if needed. 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 If she is not feeling good, see if she can come in for office visit at 12:45 or 4 pm on Friday afternoon.   Patient given message from Dr. Aundra Dubin.  Patient will be here at 4pm Friday to see Dr. Aundra Dubin

## 2015-06-23 NOTE — Telephone Encounter (Signed)
NewMessage  Pt reporting BP measurements the last week 2/2 150/76 @ 7am;; 138/68 @ 315pm 2/3 140/64 2/4 132/66 2/5 140/80  2/6 160/71 RT;; 158/75 LFT;; at home later that day 142/70 2/7 140/66 2/8 144/72

## 2015-06-23 NOTE — Telephone Encounter (Signed)
Her cardiopulmonary exercise test showed no definite cardiac or pulmonary abnormality.  She did have a hypertensive response to exercise, which is what we are trying to treat with medications.  She had PVCs noted on the CPX which are likely causing her palpitations and which can be treated with beta blocker if needed.  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  If she is not feeling good, see if she can come in for office visit at 12:45 or 4 pm on Friday afternoon.

## 2015-06-25 ENCOUNTER — Ambulatory Visit (INDEPENDENT_AMBULATORY_CARE_PROVIDER_SITE_OTHER): Payer: PPO | Admitting: Cardiology

## 2015-06-25 ENCOUNTER — Encounter: Payer: Self-pay | Admitting: Cardiology

## 2015-06-25 ENCOUNTER — Encounter: Payer: Self-pay | Admitting: *Deleted

## 2015-06-25 ENCOUNTER — Telehealth: Payer: Self-pay | Admitting: *Deleted

## 2015-06-25 VITALS — BP 140/70 | HR 64 | Ht 65.0 in | Wt 143.0 lb

## 2015-06-25 DIAGNOSIS — I6523 Occlusion and stenosis of bilateral carotid arteries: Secondary | ICD-10-CM

## 2015-06-25 DIAGNOSIS — I10 Essential (primary) hypertension: Secondary | ICD-10-CM

## 2015-06-25 DIAGNOSIS — Z951 Presence of aortocoronary bypass graft: Secondary | ICD-10-CM

## 2015-06-25 DIAGNOSIS — E785 Hyperlipidemia, unspecified: Secondary | ICD-10-CM | POA: Diagnosis not present

## 2015-06-25 DIAGNOSIS — J383 Other diseases of vocal cords: Secondary | ICD-10-CM

## 2015-06-25 DIAGNOSIS — I480 Paroxysmal atrial fibrillation: Secondary | ICD-10-CM | POA: Diagnosis not present

## 2015-06-25 DIAGNOSIS — I2510 Atherosclerotic heart disease of native coronary artery without angina pectoris: Secondary | ICD-10-CM

## 2015-06-25 DIAGNOSIS — R0600 Dyspnea, unspecified: Secondary | ICD-10-CM | POA: Diagnosis not present

## 2015-06-25 NOTE — Telephone Encounter (Signed)
-----   Message from Katrine Coho, RN sent at 06/25/2015  5:23 PM EST ----- Dr Aundra Dubin would like someone to call her and get her BP readings for the past week. Thanks.

## 2015-06-25 NOTE — Patient Instructions (Signed)
Medication Instructions:  Your physician recommends that you continue on your current medications as directed. Please refer to the Current Medication list given to you today.   Labwork: BMET in 1 week--please fax the results to Dr Kyra Searles  Testing/Procedures: None   Follow-Up: Your physician recommends that you schedule a follow-up appointment in: 6 weeks with Dr Aundra Dubin.   Any Other Special Instructions Will Be Listed Below (If Applicable).  You have been referred to Dr Melissa Montane, ENT  Your physician has requested that you regularly monitor and record your blood pressure readings at home. Please use the same machine at the same time of day to check your readings and record them. We will call you in about a week to get the BP readings.     If you need a refill on your cardiac medications before your next appointment, please call your pharmacy.

## 2015-06-27 NOTE — Progress Notes (Signed)
Patient ID: [name], female   DOB: 1940-08-26, 75 y.o.   MRN: [id number] PCP: Dr. Dena Billet  75 yo presented to [facility] in 7/12 with chest pain.  She ruled in for NSTEMI.  Cardiac catheterization 7/12: Proximal LAD 30%, mid LAD 60-70%, distal LAD 80%, ostial OM2 50%, mid OM2 40%, 80% at the trifurcation, proximal RCA 25% then occluded with left to right collaterals, EF 55%.  Echocardiogram 7/12: EF 60-65%, PASP 45.  She was referred for bypass surgery.  Grafts included a LIMA-LAD, SVG-D1, SVG-PDA.  Of note, she did have an endarterectomy of the OM.  She developed postoperative atrial fibrillation and was treated with Coumadin and amiodarone.  She maintained normal sinus rhythm with this.  Unfortunately, she developed C. Difficile colitis.  She improved and was eventually discharged home in 7/12.  She was readmitted 8/3-8/18 with failure to thrive, anasarca and profound diarrhea.  She was placed on IV vancomycin.  She was diuresed for anasarca.  Relook echocardiogram demonstrated normal LV function.  CT scan demonstrated a perforated sigmoid colon and she was referred for exploratory laparotomy.  She underwent sigmoid colectomy with end colostomy [name] procedure) by Dr. [name].  She had her colostomy reversed in 2/13.  Finally, in 2014 she was diagnosed with ulcerative colitis and started on mesalamine.   She still tires easily and gets short of breath walking on a treadmill more than about 5 minutes.  She generally does ok around the house.  This is a long-term pattern.  She saw pulmonology with no explanation for her dyspnea.  It is very bothersome for her and does not seem to change. 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  She also feels like she cannot get enough air in when she talks.  At last appointment, I took her off Toprol XL due to bradycardia.  This has not really helped with her symptoms but she started feeling more PVCs. This seems to have  tapered off, however.  It had her do a cardiopulmonary exercise test.  This was submaximal and showed a hypertensive BP response.  There was no definite evidence for a cardiac or pulmonary abnormality.  Weight is stable.   Labs (9/12): K 4.7, creatinine 0.6, LFTs normal, HCT 33.2 Labs (3/13): LDL 82, HDL 52, K 4.1, creatinine 0.8, LDL 74, HDL 66 Labs (1/14): LDL 96, HDL 69, K 4.1, creatinine 0.9 Labs (11/15): LDL 98, HDL 51 Labs (1/17): K 4.2, creatinine 0.85, LDL 86, HDL 51  PFTs (3/15): Normal  CPX (1/17) with submaximal effort, peak VO2 20.4 (normal), VE/VCO2 24 (normal) => no pulmonary or cardiac limitation, hypertensive response, frequent PVCs.   Carotid dopplers (2012): 60-79% RICA stenosis, 66-44% LICA stenosis   Past Medical History  Diagnosis Date  . Coronary artery disease     a. NSTEMI 7/12, cath: pLAD 30%, mLAD 60-70%, dLAD 80%, oOM2 50%, mOM2 40%, trifurcation 80%, pRCA 25%, then occluded, L-R collats, EF 55%;   b. 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 Roxy Manns)  . Hyperlipidemia     takes Pravastatin daily  . Pericarditis 1980    hx of  . GERD (gastroesophageal reflux disease)   . Palpitations 2003    hx of; Holter in 2003 with PACs and PVCs  . Atrial fibrillation ([place])     post-op after CABG;  treated with Amiodarone and coumadin until readmxn with CDiff colitis, perf colon and elevated LFTs (amio and coumadin stopped)  .  C. difficile colitis 11/6281    complicated post CABG course with prolonged admission, perforated sigmoid colon;  s/p sigmoid colectomy with end colostomy [name] procedure);  laparotomy wound infection with Pseudomonas treated with antibxs and secondary intention  . S/P colostomy ([place])     due to perf sigmoid colon in setting of CDiff colitis  . Surgical wound infection     Pseudomonas; wound VAC; secondary intention  . 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 Hair loss     after heart surgery  . Hypertension     takes Metoprolol  daily  . Mitral valve prolapse     was told this in the late 1990's; ;then with Dr.Ruari Duggan he states that she doesn't  . Arthritis     neck and back  . PONV (postoperative nausea and vomiting)     w/appy  . Complication of anesthesia 12/2010    "had trouble waking me up after colon OR"  . Myocardial infarction ([place]) 11/2010  . History of bronchitis     late 1980's  . Anemia     "in my early teens"  . H/O hiatal hernia     "slight; not having problems with it"  . Seizures ([place])     "w/verapimil"  . Hemorrhoids     "none since hemorrhoid OR"  . Chronic kidney disease     hx kidney stones  - Ulcerative colitis  Current Outpatient Prescriptions  Medication Sig Dispense Refill  . Ascorbic Acid (VITAMIN C) 1000 MG tablet Take 1,000 mg by mouth daily.    Marland Kitchen aspirin 81 MG tablet Take 81 mg by mouth daily.    . Bacillus Coagulans-Inulin (PROBIOTIC-PREBIOTIC) 1-250 BILLION-MG CAPS Take 1 capsule by mouth daily.     . Calcium Carbonate-Vitamin D 600-400 MG-UNIT per tablet Take 1 tablet by mouth daily.     . Coenzyme Q10 200 MG TABS Take 1 tablet by mouth daily.   0  . magnesium oxide (MAG-OX) 400 MG tablet Take 1 tablet (400 mg total) by mouth daily. 30 tablet 6  . mesalamine (APRISO) 0.375 G 24 hr capsule Take 2 capsules (0.75 g total) by mouth 2 (two) times daily. 120 capsule 11  . Multiple Vitamin (MULTIVITAMIN) tablet Take 1 tablet by mouth daily.     . Omega-3 1000 MG CAPS Take 1 g by mouth daily.     . pravastatin (PRAVACHOL) 80 MG tablet TAKE ONE TABLET BY MOUTH ONCE DAILY IN THE EVENING 90 tablet 3  . ramipril (ALTACE) 2.5 MG capsule Take 2 capsules (5 mg total) by mouth daily. 180 capsule 3   No current facility-administered medications for this visit.    Allergies: Allergies  Allergen Reactions  . Nadolol Hives and Anaphylaxis    Rash and nausea  . Other Other (See Comments) and Anaphylaxis    apples Apples-anaphylaxis  . Sulfa Antibiotics Hives  . Sulfamethoxazole  Anaphylaxis  . Sulfonamide Derivatives Anaphylaxis  . Verapamil Other (See Comments) and Anaphylaxis    seizures   SH: Lives with husband in [place], retired Therapist, sports.  Nonsmoker.   Family History  Problem Relation Age of Onset  . Heart attack Father 76    died  . Heart disease Father     massive heart attack  . Coronary artery disease Mother 34    [place]  . Heart disease Mother   . Anesthesia problems Neg Hx   . Hypotension Neg Hx   . Malignant  hyperthermia Neg Hx   . Pseudochol deficiency Neg Hx   . Colon cancer Neg Hx   . Esophageal cancer Neg Hx   . Stomach cancer Neg Hx   . Rectal cancer Neg Hx    ROS: All systems reviewed and negative except as per HPI.   Vital Signs: BP 140/70 mmHg  Pulse 64  Ht 5' 5"  (1.651 m)  Wt 143 lb (64.864 kg)  BMI 23.80 kg/m2  PHYSICAL EXAM: General: Well nourished, well developed, in no acute distress HEENT: normal Neck: no JVD At 90 Cardiac:  normal S1, S2; RRR; No murmur.  Lungs:  clear to auscultation bilaterally, no wheezing, rhonchi or rales SKS:HNGI, nontender, no hepatosplenomegaly Ext: no edema Skin: warm and dry Psych: Normal affect  Assesssment/Plan:  Atrial fibrillation Patient had post-operative atrial fibrillation with no documented recurrence. She is in sinus rhythm today. 3-week monitor in 3/13 showed no atrial fibrillation.  Plan anticoagulation if recurrent atrial fibrillation is noted.  CAD  Status post CABG. EF preserved. She is on ASA 81, pravastatin, and ramipril.  No chest pain.   HYPERLIPIDEMIA Patient unable to tolerate Crestor due to leg weakness. She has done well so far with pravastatin. LDL acceptable in 1/17.   Dyspnea with exertion She is not volume overloaded on exam.  Doubt ischemia.  She saw pulmonary with no explanation.  CPX was done and though it was submaximal, indicated no significant cardiac or pulmonary abnormality.  She had a hypertensive BP response.  I wonder if her vocal cord  dysfunction is creating a sense of dyspnea (has been diagnosed with spasmodic dysphonia).  - It has been recommended that she have botox injections in her vocal cords.  The ENT she had been seeing in [place] is out of network for her.  I will refer her to someone in [place].  I will be interested to see if the injections help with her dyspnea.  - If she continues to have exertional dyspnea, next step would likely be left and right heart catheterization.  I will see her back in about 6 wks to see how she is doing.  Carotid stenosis Moderate bilateral stenosis in 2012, she is due for repeat carotid dopplers.  She says that this has been ordered through her PCP. HTN BP remains high.  I have increased ramipril recently to 5 mg daily. Needs BMET in 1 week.  She will check BP daily and will call readings in to [place] in 2 wks.   Loralie Champagne 06/27/2015

## 2015-06-28 DIAGNOSIS — I6523 Occlusion and stenosis of bilateral carotid arteries: Secondary | ICD-10-CM | POA: Diagnosis not present

## 2015-06-28 DIAGNOSIS — R0989 Other specified symptoms and signs involving the circulatory and respiratory systems: Secondary | ICD-10-CM | POA: Diagnosis not present

## 2015-06-28 DIAGNOSIS — Z1231 Encounter for screening mammogram for malignant neoplasm of breast: Secondary | ICD-10-CM | POA: Diagnosis not present

## 2015-07-01 DIAGNOSIS — I10 Essential (primary) hypertension: Secondary | ICD-10-CM | POA: Diagnosis not present

## 2015-07-02 NOTE — Telephone Encounter (Signed)
LMTCB; need to get BP readings from the past week

## 2015-07-05 NOTE — Telephone Encounter (Signed)
Pt was in to see Dr. Aundra Dubin on 2/10.   Per Dr. Aundra Dubin- HTN BP remains high. I have increased ramipril recently to 5 mg daily. Needs BMET in 1 week. She will check BP daily and will call readings in to [place] in 2 wks.

## 2015-07-07 ENCOUNTER — Encounter: Payer: Self-pay | Admitting: Cardiology

## 2015-07-10 DIAGNOSIS — J111 Influenza due to unidentified influenza virus with other respiratory manifestations: Secondary | ICD-10-CM | POA: Diagnosis not present

## 2015-07-14 DIAGNOSIS — J209 Acute bronchitis, unspecified: Secondary | ICD-10-CM | POA: Diagnosis not present

## 2015-07-26 ENCOUNTER — Other Ambulatory Visit: Payer: Self-pay | Admitting: *Deleted

## 2015-07-26 DIAGNOSIS — I6521 Occlusion and stenosis of right carotid artery: Secondary | ICD-10-CM

## 2015-07-27 ENCOUNTER — Ambulatory Visit: Payer: PPO | Admitting: Cardiology

## 2015-07-29 ENCOUNTER — Telehealth: Payer: Self-pay | Admitting: Cardiology

## 2015-07-29 NOTE — Telephone Encounter (Signed)
Pt was referred to Vascular service Dr. Stephens Shire. Pt is scheduled for an appointment on 08/30/15. The scheduler Sharen Hones is aware to call pt to  let her know about VV'S surgery referral information.

## 2015-07-29 NOTE — Telephone Encounter (Signed)
New message      Calling to let us know pt has not received an call from Dr Stephens Shire office to schedule an appt

## 2015-08-20 ENCOUNTER — Encounter: Payer: Self-pay | Admitting: Surgery

## 2015-08-25 ENCOUNTER — Encounter: Payer: Self-pay | Admitting: Gastroenterology

## 2015-08-30 ENCOUNTER — Encounter: Payer: Self-pay | Admitting: Surgery

## 2015-08-30 ENCOUNTER — Ambulatory Visit (INDEPENDENT_AMBULATORY_CARE_PROVIDER_SITE_OTHER): Payer: PPO | Admitting: Surgery

## 2015-08-30 VITALS — BP 152/67 | HR 60 | Temp 97.6°F | Resp 18 | Ht 66.0 in | Wt 143.0 lb

## 2015-08-30 DIAGNOSIS — I6523 Occlusion and stenosis of bilateral carotid arteries: Secondary | ICD-10-CM | POA: Diagnosis not present

## 2015-08-30 NOTE — Progress Notes (Signed)
[facility] [place]  Patient name: [name] MRN: [id number] DOB: Nov 20, 1940 Sex: female  REASON FOR CONSULT: Carotid stenosis Referring physician:  Dr. Aundra Dubin  HPI: [name] is a 75 y.o. female, who is referred today for evaluation of carotid disease.  She had a duplex in 2012 which revealed moderate bilateral stenosis.  This was repeated recently [facility] imaging and she had greater than 70% right carotid stenosis.  Peak systolic velocities were to 67 cm/s.  Left-sided stenosis was less than 50%.  The patient remained asymptomatic.  Specifically, she denies numbness or weakness in either extremity.  She denies slurred speech.  She denies amaurosis fugax.  The patient has a history of coronary artery disease.  She is status post an STEMI in 2012.  She went on to have CABG.  She developed atrial fibrillation postoperatively which was treated with amiodarone and Coumadin she also developed C. difficile colitis.  She ended up with a perforated sigmoid colon requiring sigmoid colectomy and end colostomy.  Her colostomy was reversed in February 2013 she went on to be diagnosed with ulcerative colitis.  She is medically managed for hypercholesterolemia with a statin.  She has been diagnosed with spasmodic dysphonia.  She is scheduled to be evaluated and treated by Dr. Janace Hoard of ENT in the near future.  Past Medical History  Diagnosis Date  . Coronary artery disease     a. NSTEMI 7/12, cath: pLAD 30%, mLAD 60-70%, dLAD 80%, oOM2 50%, mOM2 40%, trifurcation 80%, pRCA 25%, then occluded, L-R collats, EF 55%;   b. 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 Roxy Manns)  . Hyperlipidemia     takes Pravastatin daily  . Pericarditis 1980    hx of  . GERD (gastroesophageal reflux disease)   . Palpitations 2003    hx of; Holter in 2003 with PACs and PVCs  . Atrial fibrillation ([place])     post-op after CABG;  treated with Amiodarone and coumadin until readmxn  with CDiff colitis, perf colon and elevated LFTs (amio and coumadin stopped)  . C. difficile colitis 08/4816    complicated post CABG course with prolonged admission, perforated sigmoid colon;  s/p sigmoid colectomy with end colostomy [name] procedure);  laparotomy wound infection with Pseudomonas treated with antibxs and secondary intention  . S/P colostomy ([place])     due to perf sigmoid colon in setting of CDiff colitis  . Surgical wound infection     Pseudomonas; wound VAC; secondary intention  . 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 Hair loss     after heart surgery  . Hypertension     takes Metoprolol daily  . Mitral valve prolapse     was told this in the late 1990's; ;then with Dr.Mclean he states that she doesn't  . Arthritis     neck and back  . PONV (postoperative nausea and vomiting)     w/appy  . Complication of anesthesia 12/2010    "had trouble waking me up after colon OR"  . Myocardial infarction ([place]) 11/2010  . History of bronchitis     late 1980's  . Anemia     "in my early teens"  . H/O hiatal hernia     "slight; not having problems with it"  . Seizures ([place])     "w/verapimil"  . Hemorrhoids     "none since hemorrhoid OR"  . Chronic kidney disease     hx kidney stones  Family History  Problem Relation Age of Onset  . Heart attack Father 48    died  . Heart disease Father     massive heart attack  . Coronary artery disease Mother 2    [place]  . Heart disease Mother   . Anesthesia problems Neg Hx   . Hypotension Neg Hx   . Malignant hyperthermia Neg Hx   . Pseudochol deficiency Neg Hx   . Colon cancer Neg Hx   . Esophageal cancer Neg Hx   . Stomach cancer Neg Hx   . Rectal cancer Neg Hx     SOCIAL HISTORY: Social History   Social History  . Marital Status: Married    Spouse Name: N/A  . Number of Children: 1  . Years of Education: N/A   Occupational History  . LPN    Social History Main Topics  . Smoking status:  Never Smoker   . Smokeless tobacco: Never Used  . Alcohol Use: No  . Drug Use: No  . Sexual Activity: Yes    Birth Control/ Protection: Post-menopausal   Other Topics Concern  . Not on file   Social History Narrative    Allergies  Allergen Reactions  . Nadolol Hives and Anaphylaxis    Rash and nausea  . Other Other (See Comments) and Anaphylaxis    apples Apples-anaphylaxis  . Sulfa Antibiotics Hives  . Sulfamethoxazole Anaphylaxis  . Sulfonamide Derivatives Anaphylaxis  . Verapamil Other (See Comments) and Anaphylaxis    seizures    Current Outpatient Prescriptions  Medication Sig Dispense Refill  . Ascorbic Acid (VITAMIN C) 1000 MG tablet Take 1,000 mg by mouth daily.    Marland Kitchen aspirin 81 MG tablet Take 81 mg by mouth daily.    . Bacillus Coagulans-Inulin (PROBIOTIC-PREBIOTIC) 1-250 BILLION-MG CAPS Take 1 capsule by mouth daily.     . Calcium Carbonate-Vitamin D 600-400 MG-UNIT per tablet Take 1 tablet by mouth daily.     . Coenzyme Q10 200 MG TABS Take 1 tablet by mouth daily.   0  . magnesium oxide (MAG-OX) 400 MG tablet Take 1 tablet (400 mg total) by mouth daily. 30 tablet 6  . mesalamine (APRISO) 0.375 G 24 hr capsule Take 2 capsules (0.75 g total) by mouth 2 (two) times daily. 120 capsule 11  . Multiple Vitamin (MULTIVITAMIN) tablet Take 1 tablet by mouth daily.     . Omega-3 1000 MG CAPS Take 1 g by mouth daily.     . pravastatin (PRAVACHOL) 80 MG tablet TAKE ONE TABLET BY MOUTH ONCE DAILY IN THE EVENING 90 tablet 3  . ramipril (ALTACE) 2.5 MG capsule Take 2 capsules (5 mg total) by mouth daily. 180 capsule 3   No current facility-administered medications for this visit.    REVIEW OF SYSTEMS:  Please see history of present illness for pertinent positives and negatives.  Otherwise all systems negative   PHYSICAL EXAM: Filed Vitals:   08/30/15 1009 08/30/15 1011  BP: 160/74 152/67  Pulse: 58 60  Temp: 97.6 F (36.4 C)   TempSrc: Oral   Resp: 18   Height: 5'  6" (1.676 m)   Weight: 143 lb (64.864 kg)   SpO2: 99%     GENERAL: The patient is a well-nourished female, in no acute distress. The vital signs are documented above. CARDIAC: There is a regular rate and rhythm.  VASCULAR: No carotid bruits.  Palpable radial pulse bilaterally PULMONARY: There is good air exchange bilaterally  without wheezing or rales. ABDOMEN: Soft and non-tender with normal pitched bowel sounds.  MUSCULOSKELETAL: There are no major deformities or cyanosis. NEUROLOGIC: No focal weakness or paresthesias are detected. SKIN: There are no ulcers or rashes noted. PSYCHIATRIC: The patient has a normal affect.  DATA:  I have reviewed her outside ultrasound which shows greater than 70% right carotid stenosis.  Peak systolic velocity were to 67 cm/s  MEDICAL ISSUES: Asymptomatic carotid stenosis: I discussed with the patient that I would like to have her ultrasound repeated given that the scale on the current study stops at greater than 70%.  In addition her peak systolic velocity was 282.  No diastolic velocities are reported.  There is a reasonable chance that she still remains in the 60-79 percent category, meaning that I would not recommend surgery.  In addition, she is currently being evaluated for spasmodic dysphonia.  I would like for her to discuss with Dr. Janace Hoard of ENT the ramifications of a vagus nerve injury with her condition.  We spent greater than 45 minutes discussing this.  I am going to have her come back in 3 months for repeat evaluation and also a repeat carotid ultrasound.  She is on maximal medical therapy for treatment of her carotid disease   Annamarie Major [facility] [employer]: [phone]

## 2015-08-31 ENCOUNTER — Other Ambulatory Visit: Payer: Self-pay | Admitting: Cardiology

## 2015-10-08 ENCOUNTER — Other Ambulatory Visit: Payer: Self-pay | Admitting: *Deleted

## 2015-10-08 ENCOUNTER — Other Ambulatory Visit: Payer: Self-pay | Admitting: Gastroenterology

## 2015-10-08 DIAGNOSIS — I6523 Occlusion and stenosis of bilateral carotid arteries: Secondary | ICD-10-CM

## 2015-10-18 ENCOUNTER — Other Ambulatory Visit (INDEPENDENT_AMBULATORY_CARE_PROVIDER_SITE_OTHER): Payer: PPO

## 2015-10-18 ENCOUNTER — Encounter: Payer: Self-pay | Admitting: Gastroenterology

## 2015-10-18 ENCOUNTER — Other Ambulatory Visit: Payer: Self-pay

## 2015-10-18 ENCOUNTER — Ambulatory Visit (INDEPENDENT_AMBULATORY_CARE_PROVIDER_SITE_OTHER): Payer: PPO | Admitting: Gastroenterology

## 2015-10-18 VITALS — BP 136/66 | HR 64 | Ht 65.75 in

## 2015-10-18 DIAGNOSIS — K51919 Ulcerative colitis, unspecified with unspecified complications: Secondary | ICD-10-CM

## 2015-10-18 DIAGNOSIS — R197 Diarrhea, unspecified: Secondary | ICD-10-CM

## 2015-10-18 LAB — CBC WITH DIFFERENTIAL/PLATELET
BASOS ABS: 0 10*3/uL (ref 0.0–0.1)
Basophils Relative: 0.7 % (ref 0.0–3.0)
EOS PCT: 0 % (ref 0.0–5.0)
Eosinophils Absolute: 0 10*3/uL (ref 0.0–0.7)
HCT: 38.2 % (ref 36.0–46.0)
Hemoglobin: 12.7 g/dL (ref 12.0–15.0)
LYMPHS ABS: 1 10*3/uL (ref 0.7–4.0)
Lymphocytes Relative: 28.3 % (ref 12.0–46.0)
MCHC: 33.3 g/dL (ref 30.0–36.0)
MCV: 83.6 fl (ref 78.0–100.0)
MONO ABS: 0.4 10*3/uL (ref 0.1–1.0)
Monocytes Relative: 9.8 % (ref 3.0–12.0)
NEUTROS ABS: 2.2 10*3/uL (ref 1.4–7.7)
NEUTROS PCT: 61.2 % (ref 43.0–77.0)
PLATELETS: 139 10*3/uL — AB (ref 150.0–400.0)
RBC: 4.56 Mil/uL (ref 3.87–5.11)
RDW: 15.1 % (ref 11.5–15.5)
WBC: 3.6 10*3/uL — ABNORMAL LOW (ref 4.0–10.5)

## 2015-10-18 LAB — COMPREHENSIVE METABOLIC PANEL
ALBUMIN: 4.2 g/dL (ref 3.5–5.2)
ALK PHOS: 52 U/L (ref 39–117)
ALT: 21 U/L (ref 0–35)
AST: 26 U/L (ref 0–37)
BILIRUBIN TOTAL: 0.6 mg/dL (ref 0.2–1.2)
BUN: 22 mg/dL (ref 6–23)
CO2: 30 mEq/L (ref 19–32)
CREATININE: 0.83 mg/dL (ref 0.40–1.20)
Calcium: 9.5 mg/dL (ref 8.4–10.5)
Chloride: 103 mEq/L (ref 96–112)
GFR: 71.55 mL/min (ref >60.00)
GLUCOSE: 103 mg/dL — AB (ref 70–99)
POTASSIUM: 4.6 meq/L (ref 3.5–5.1)
SODIUM: 138 meq/L (ref 135–145)
TOTAL PROTEIN: 7.7 g/dL (ref 6.0–8.3)

## 2015-10-18 MED ORDER — METRONIDAZOLE 500 MG PO TABS: 500.0000 mg | ORAL_TABLET | Freq: Three times a day (TID) | ORAL | Status: DC

## 2015-10-18 NOTE — Patient Instructions (Signed)
You will have labs checked today in the basement lab.  Please head down after you check out with the front desk  (cbc, cmet, c. Diff by PCR and by Toxin). Prescription for flagyl 570m one pill tid for 10 days (don't start this until after you have dropped off the stool samples.). Please return to see Dr. JArdis Hughsin 6-8 weeks.

## 2015-10-18 NOTE — Progress Notes (Signed)
Review of pertinent gastrointestinal problems:  1. C. Diff coltis, eventually perforated sigmoid, s/p segmental colectomy, reversed 2/13.  2. 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 She underwent 3-4 sequential sigmoidoscopies with dilation of the stricture. Eventually dilated up to 20 mm with very good results in her diarrhea. Last examination was August 2013, this was a full colonoscopy. No polyps were seen. She did have proximal to the site of the previous stricture, the colon was somewhat edematous.  3. routine risk for colon cancer, next colonoscopy August 2023  4. Chronic colitis, July 2014 colonoscopy (mildly inflamed colon throughout, biopsies confirmed chronic inflammation). This was done for diarrhea. CC anastomosis at the time was open to 94m, repeat dilation performed. Pred 247mdaily was started. Hot flashes, racing heart and so changed to uceris 77m24mer day. Sept 2014; started mesalamine 4.8 grams per day, with good response (apriso)   HPI: This is a   very pleasant [age]ar old woman whom I last saw about a year ago Chief complaint is flare of diarrhea, urgency, left lower quadrant discomforts.  For about a month, she has had loose stools  Started with diarrhea for 2 weeks, took an imodium periodically with +/- effect.  Non bloody.  + LLQ discomfort.  Some urgency.  SHe had z pack for URI infection in February (also tamiflu)  Under a lot of stress.  She's had no nausea or vomiting  Past Medical History  Diagnosis Date  . Coronary artery disease     a. NSTEMI 7/12, cath: pLAD 30%, mLAD 60-70%, dLAD 80%, oOM2 50%, mOM2 40%, trifurcation 80%, pRCA 25%, then occluded, L-R collats, EF 55%;   b. 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 OweRoxy Manns. Hyperlipidemia     takes Pravastatin daily  . Pericarditis 1980    hx of  . GERD (gastroesophageal reflux disease)   . Palpitations 2003    hx of; Holter in 2003 with PACs  and PVCs  . [place] ([place]   post-op after CABG;  treated with Amiodarone and coumadin until readmxn with CDiff colitis, perf colon and elevated LFTs (amio and coumadin stopped)  . C. difficile colitis 01/06/538 complicated post CABG course with prolonged admission, perforated sigmoid colon;  s/p sigmoid colectomy with end colostomy (HaJeanette Capriceocedure);  laparotomy wound infection with Pseudomonas treated with antibxs and secondary intention  . [place] ([place]   due to perf sigmoid colon in setting of CDiff colitis  . Surgical wound infection     Pseudomonas; wound VAC; secondary intention  . 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 Hair loss     after heart surgery  . Hypertension     takes Metoprolol daily  . Mitral valve prolapse     was told this in the late 1990's; ;then with Dr.Mclean he states that she doesn't  . Arthritis     neck and back  . PONV (postoperative nausea and vomiting)     w/appy  . Complication of anesthesia 12/2010    "had trouble waking me up after colon OR"  . [place] ([place]  . History of bronchitis     late 1980's  . Anemia     "in my early teens"  . H/O hiatal hernia     "slight; not having problems with it"  . Seizures ([place]   "w/verapimil"  . Hemorrhoids     "none  since hemorrhoid OR"  . Chronic kidney disease     hx kidney stones    Past Surgical History  Procedure Laterality Date  . Oophorectomy  2005    left; w/"mass removal"  . Hemorrhoid surgery  ~ 2005    x 2  . Sigmoidectomy  12/18/2010    with end colostomy  . Appendectomy      as a child  . Colonoscopy    . Esophagogastroduodenoscopy    . Tubal ligation    . Colostomy reversal  06/27/11  . Coronary artery bypass graft  11-25-2010    CABG X3; LIMA to LAD, SVG to PDA, SVG to OM, EVH via right thigh  . Cardiac catheterization  11/21/10  . Proctoscopy  06/27/2011    Procedure: PROCTOSCOPY;  Surgeon: Gayland Curry, MD;  Location: [facility];  Service:  General;  Laterality: N/A;  . Colostomy reconnect    . Flexible sigmoidoscopy  11/09/2011    Procedure: FLEXIBLE SIGMOIDOSCOPY;  Surgeon: Milus Banister, MD;  Location: [facility];  Service: Endoscopy;  Laterality: N/A;  . Cataract extraction, bilateral      Current Outpatient Prescriptions  Medication Sig Dispense Refill  . APRISO 0.375 g 24 hr capsule TAKE TWO CAPSULES BY MOUTH TWICE DAILY 120 capsule 0  . Ascorbic Acid (VITAMIN C) 1000 MG tablet Take 1,000 mg by mouth daily.    Marland Kitchen aspirin 81 MG tablet Take 81 mg by mouth daily.    . Bacillus Coagulans-Inulin (PROBIOTIC-PREBIOTIC) 1-250 BILLION-MG CAPS Take 1 capsule by mouth daily.     . Calcium Carbonate-Vitamin D 600-400 MG-UNIT per tablet Take 1 tablet by mouth daily.     . Coenzyme Q10 200 MG TABS Take 1 tablet by mouth daily.   0  . magnesium oxide (MAG-OX) 400 MG tablet Take 1 tablet (400 mg total) by mouth daily. 30 tablet 6  . Multiple Vitamin (MULTIVITAMIN) tablet Take 1 tablet by mouth daily.     . Omega-3 1000 MG CAPS Take 1 g by mouth daily.     . pravastatin (PRAVACHOL) 80 MG tablet TAKE ONE TABLET BY MOUTH ONCE DAILY IN THE EVENING 90 tablet 3  . ramipril (ALTACE) 2.5 MG capsule Take 2 capsules (5 mg total) by mouth daily. 180 capsule 3   No current facility-administered medications for this visit.    Allergies as of 10/18/2015 - Review Complete 10/18/2015  Allergen Reaction Noted  . Nadolol Hives and Anaphylaxis 08/02/2009  . Other Other (See Comments) and Anaphylaxis 06/22/2011  . Sulfa antibiotics Hives 11/20/2011  . Sulfamethoxazole Anaphylaxis 05/03/2015  . Sulfonamide derivatives Anaphylaxis   . Verapamil Other (See Comments) and Anaphylaxis     Family History  Problem Relation Age of Onset  . Heart attack Father 19    died  . Heart disease Father     massive heart attack  . Coronary artery disease Mother 1    had bypass in the pass  . Heart disease Mother   . Anesthesia problems Neg Hx   .  Hypotension Neg Hx   . Malignant hyperthermia Neg Hx   . Pseudochol deficiency Neg Hx   . Colon cancer Neg Hx   . Esophageal cancer Neg Hx   . Stomach cancer Neg Hx   . Rectal cancer Neg Hx     Social History   Social History  . Marital Status: Married    Spouse Name: N/A  . Number of Children: 1  . Years of Education: N/A  Occupational History  . LPN    Social History Main Topics  . Smoking status: Never Smoker   . Smokeless tobacco: Never Used  . Alcohol Use: No  . Drug Use: No  . Sexual Activity: Yes    Birth Control/ Protection: Post-menopausal   Other Topics Concern  . Not on file   Social History Narrative     Physical Exam: BP 136/66 mmHg  Pulse 64  Ht 5' 5.75" (1.67 m) Constitutional: generally well-appearing Psychiatric: alert and oriented x3 Abdomen: soft, Mildly tender left lower quadrant, nondistended, no obvious ascites, no peritoneal signs, normal bowel sounds   Assessment and plan: 75 y.o. female with Ulcerative colitis, current left-sided pain, urgency, loose stools  Differential diagnosis includes ulcerative colitis flare, infection such as Clostridium difficile since she took antibiotics about 2 months prior to her symptom onset, perhaps the sigmoid stricture has returned and become more symptomatic. I think stricture is less likely since she does have some tenderness in her left lower quadrant that I would not expect with stricture.  She has had Clostridium difficile in the past, quite complicated requiring surgery even. She is going to get a set of stool testing and blood work today see those tests summarized below inpatient instructions. After dropping off the stool samples she will start empiric C. difficile treatment with Flagyl 500 mg 3 times daily for 10 days. If the stool testing shows no sign of Clostridium difficile that I would likely treat as flare of her underlying colitis with steroids, uceris.  rov in 6-8 weeks, sooner if  needed.   Owens Loffler, MD [facility] 10/18/2015, 8:48 AM

## 2015-10-19 ENCOUNTER — Telehealth: Payer: Self-pay | Admitting: Gastroenterology

## 2015-10-20 NOTE — Telephone Encounter (Signed)
Left message on machine to call back  

## 2015-10-22 ENCOUNTER — Telehealth: Payer: Self-pay | Admitting: Gastroenterology

## 2015-10-22 ENCOUNTER — Other Ambulatory Visit: Payer: PPO

## 2015-10-22 DIAGNOSIS — K51919 Ulcerative colitis, unspecified with unspecified complications: Secondary | ICD-10-CM | POA: Diagnosis not present

## 2015-10-22 DIAGNOSIS — R197 Diarrhea, unspecified: Secondary | ICD-10-CM | POA: Diagnosis not present

## 2015-10-22 NOTE — Telephone Encounter (Signed)
Pt states she is having diarrhea again and wants to know if she should start taking flagyl again. Stool test collected but results not back. Please advise.

## 2015-10-22 NOTE — Telephone Encounter (Signed)
Spoke with pt and she states she did not turn in the stool specimen until this morning because the diarrhea had stopped. This morning at 4am she was able to collect the stool and has had 4 diarrhea stools since then. Pt instructed to go ahead and start the flagyl. Pt verbalized understanding.

## 2015-10-22 NOTE — Telephone Encounter (Signed)
I thought she was going to start the flagyl that we called in right after dropping off the stool testing.  See my last note, instructions from 3-4 days ago.  If she has not started the flagyl, she should now.

## 2015-10-23 LAB — CLOSTRIDIUM DIFFICILE BY PCR: Toxigenic C. Difficile by PCR: NOT DETECTED

## 2015-10-28 ENCOUNTER — Telehealth: Payer: Self-pay | Admitting: Gastroenterology

## 2015-10-28 NOTE — Telephone Encounter (Signed)
Pt has taken 7 days of flagyl and states she feels dizzy and just "not well"  Can she stop taking now.  She was prescribed 500 mg TID for 10 days.   75 y.o. female with Ulcerative colitis, current left-sided pain, urgency, loose stools Differential diagnosis includes ulcerative colitis flare, infection such as Clostridium difficile since she took antibiotics about 2 months prior to her symptom onset, perhaps the sigmoid stricture has returned and become more symptomatic. I think stricture is less likely since she does have some tenderness in her left lower quadrant that I would not expect with stricture. She has had Clostridium difficile in the past, quite complicated requiring surgery even. She is going to get a set of stool testing and blood work today see those tests summarized below inpatient instructions. After dropping off the stool samples she will start empiric C. difficile treatment with Flagyl 500 mg 3 times daily for 10 days. If the stool testing shows no sign of Clostridium difficile that I would likely treat as flare of her underlying colitis with steroids, uceris. rov in 6-8 weeks, sooner if needed.

## 2015-10-28 NOTE — Telephone Encounter (Signed)
Stop metronidazole as discussed and Dr. Ardis Hughs to review what he wants to start next

## 2015-10-28 NOTE — Telephone Encounter (Signed)
Stool sample has been submitted

## 2015-10-29 DIAGNOSIS — I10 Essential (primary) hypertension: Secondary | ICD-10-CM | POA: Diagnosis not present

## 2015-10-29 DIAGNOSIS — B9689 Other specified bacterial agents as the cause of diseases classified elsewhere: Secondary | ICD-10-CM | POA: Diagnosis not present

## 2015-10-29 DIAGNOSIS — J208 Acute bronchitis due to other specified organisms: Secondary | ICD-10-CM | POA: Diagnosis not present

## 2015-10-29 DIAGNOSIS — K51919 Ulcerative colitis, unspecified with unspecified complications: Secondary | ICD-10-CM | POA: Diagnosis not present

## 2015-10-29 MED ORDER — BUDESONIDE 9 MG PO TB24: 9.0000 mg | ORAL_TABLET | Freq: Every day | ORAL | Status: DC

## 2015-10-29 NOTE — Telephone Encounter (Signed)
Lets get her back on uceris 35m.  One pill once daily, disp 30 with 3 refills.  rov with me in 4-6 weeks.

## 2015-10-29 NOTE — Telephone Encounter (Signed)
Dr Ardis Hughs please review and advise further recommendations

## 2015-10-29 NOTE — Telephone Encounter (Signed)
Pt aware and will Dr Ardis Hughs 12/31/15 or call back sooner with any problems

## 2015-11-08 ENCOUNTER — Other Ambulatory Visit: Payer: Self-pay | Admitting: Gastroenterology

## 2015-12-02 ENCOUNTER — Encounter: Payer: Self-pay | Admitting: Surgery

## 2015-12-03 DIAGNOSIS — J385 Laryngeal spasm: Secondary | ICD-10-CM | POA: Diagnosis not present

## 2015-12-03 HISTORY — PX: BOTOX INJECTION: SHX5754

## 2015-12-06 ENCOUNTER — Ambulatory Visit (HOSPITAL_COMMUNITY)
Admission: RE | Admit: 2015-12-06 | Discharge: 2015-12-06 | Disposition: A | Discharge status: Home / Self Care | Payer: PPO | Source: Ambulatory Visit | Attending: Surgery | Admitting: Surgery

## 2015-12-06 DIAGNOSIS — K219 Gastro-esophageal reflux disease without esophagitis: Secondary | ICD-10-CM | POA: Insufficient documentation

## 2015-12-06 DIAGNOSIS — N189 Chronic kidney disease, unspecified: Secondary | ICD-10-CM | POA: Insufficient documentation

## 2015-12-06 DIAGNOSIS — I6523 Occlusion and stenosis of bilateral carotid arteries: Secondary | ICD-10-CM | POA: Diagnosis not present

## 2015-12-06 DIAGNOSIS — I2510 Atherosclerotic heart disease of native coronary artery without angina pectoris: Secondary | ICD-10-CM | POA: Insufficient documentation

## 2015-12-06 DIAGNOSIS — E785 Hyperlipidemia, unspecified: Secondary | ICD-10-CM | POA: Insufficient documentation

## 2015-12-06 DIAGNOSIS — I129 Hypertensive chronic kidney disease with stage 1 through stage 4 chronic kidney disease, or unspecified chronic kidney disease: Secondary | ICD-10-CM | POA: Insufficient documentation

## 2015-12-06 LAB — VAS US CAROTID
LCCADSYS: 82 cm/s
LEFT ECA DIAS: -9 cm/s
LICADDIAS: -29 cm/s
LICAPDIAS: -22 cm/s
Left CCA dist dias: 20 cm/s
Left CCA prox dias: 19 cm/s
Left CCA prox sys: 113 cm/s
Left ICA dist sys: -102 cm/s
Left ICA prox sys: -98 cm/s
RCCADSYS: -82 cm/s
RCCAPDIAS: 19 cm/s
RIGHT CCA MID DIAS: 20 cm/s
RIGHT ECA DIAS: -8 cm/s
Right CCA prox sys: 99 cm/s

## 2015-12-10 ENCOUNTER — Ambulatory Visit: Payer: PPO | Admitting: Gastroenterology

## 2015-12-10 ENCOUNTER — Other Ambulatory Visit: Payer: Self-pay | Admitting: Gastroenterology

## 2015-12-13 ENCOUNTER — Ambulatory Visit (INDEPENDENT_AMBULATORY_CARE_PROVIDER_SITE_OTHER): Payer: PPO | Admitting: Surgery

## 2015-12-13 ENCOUNTER — Encounter: Payer: Self-pay | Admitting: Surgery

## 2015-12-13 ENCOUNTER — Ambulatory Visit: Payer: PPO | Admitting: Surgery

## 2015-12-13 VITALS — BP 149/73 | HR 60 | Temp 97.9°F | Resp 16 | Ht 65.0 in | Wt 143.0 lb

## 2015-12-13 DIAGNOSIS — I6523 Occlusion and stenosis of bilateral carotid arteries: Secondary | ICD-10-CM

## 2015-12-13 NOTE — Progress Notes (Signed)
[facility]ist of [place]  Patient name: [name] MRN: [id number] DOB: 01/22/1941 Sex: female  REASON FOR VISIT: Follow-up carotid stenosis  HPI: [name] is a 74 y.o. female, who is referred today for evaluation of carotid disease.  She had a duplex in 2012 which revealed moderate bilateral stenosis.  This was repeated recently [facility] imaging and she had greater than 70% right carotid stenosis.  Peak systolic velocities were to 67 cm/s.  Left-sided stenosis was less than 50%.  The patient remained asymptomatic.  Specifically, she denies numbness or weakness in either extremity.  She denies slurred speech.  She denies amaurosis fugax.  The patient has a history of coronary artery disease.  She is status post an STEMI in 2012.  She went on to have CABG.  She developed atrial fibrillation postoperatively which was treated with amiodarone and Coumadin she also developed C. difficile colitis.  She ended up with a perforated sigmoid colon requiring sigmoid colectomy and end colostomy.  Her colostomy was reversed in February 2013 she went on to be diagnosed with ulcerative colitis.  She is medically managed for hypercholesterolemia with a statin.  She has been diagnosed with spasmodic dysphonia.  She recently underwent B[facility] with significant improvement.  She remains asymptomatic from her carotid stenosis.  Past Medical History:  Diagnosis Date  . Anemia    "in my early teens"  . Arthritis    neck and back  . Atrial fibrillation ([place])    post-op after CABG;  treated with Amiodarone and coumadin until readmxn with CDiff colitis, perf colon and elevated LFTs (amio and coumadin stopped)  . C. difficile colitis 12/1015   complicated post CABG course with prolonged admission, perforated sigmoid colon;  s/p sigmoid colectomy with end colostomy [name] procedure);  laparotomy wound infection with  Pseudomonas treated with antibxs and secondary intention  . Chronic kidney disease    hx kidney stones  . Complication of anesthesia 12/2010   "had trouble waking me up after colon OR"  . Coronary artery disease    a. NSTEMI 7/12, cath: pLAD 30%, mLAD 60-70%, dLAD 80%, oOM2 50%, mOM2 40%, trifurcation 80%, pRCA 25%, then occluded, L-R collats, EF 55%;   b. 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 Roxy Manns)  . GERD (gastroesophageal reflux disease)   . H/O hiatal hernia    "slight; not having problems with it"  . Hair loss    after heart surgery  . Hemorrhoids    "none since hemorrhoid OR"  . History of bronchitis    late 1980's  . Hyperlipidemia    takes Pravastatin daily  . Hypertension    takes Metoprolol daily  . Mitral valve prolapse    was told this in the late 1990's; ;then with Dr.Mclean he states that she doesn't  . Myocardial infarction ([place]) 11/2010  . Palpitations 2003   hx of; Holter in 2003 with PACs and PVCs  . Pericarditis 1980   hx of  . PONV (postoperative nausea and vomiting)    w/appy  . 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 S/P colostomy ([place])    due to perf sigmoid colon in setting of CDiff colitis  . Seizures ([place])    "w/verapimil"  . Surgical wound infection    Pseudomonas; wound VAC; secondary intention    Family History  Problem Relation Age of Onset  . Heart attack Father 84    died  .  Heart disease Father     massive heart attack  . Coronary artery disease Mother 49    [place]  . Heart disease Mother   . Anesthesia problems Neg Hx   . Hypotension Neg Hx   . Malignant hyperthermia Neg Hx   . Pseudochol deficiency Neg Hx   . Colon cancer Neg Hx   . Esophageal cancer Neg Hx   . Stomach cancer Neg Hx   . Rectal cancer Neg Hx     SOCIAL HISTORY: Social History  Substance Use Topics  . Smoking status: Never Smoker  . Smokeless tobacco: Never Used  . Alcohol use No    Allergies  Allergen Reactions  . Nadolol  Hives and Anaphylaxis    Rash and nausea  . Other Other (See Comments) and Anaphylaxis    apples Apples-anaphylaxis  . Sulfa Antibiotics Hives  . Sulfamethoxazole Anaphylaxis  . Sulfonamide Derivatives Anaphylaxis  . Verapamil Other (See Comments) and Anaphylaxis    seizures    Current Outpatient Prescriptions  Medication Sig Dispense Refill  . APRISO 0.375 g 24 hr capsule TAKE TWO CAPSULES BY MOUTH TWICE DAILY. DUE  FOR  FOLLOW  UP . NO  FURTHER  REFILLS  UNTIL  APPOINTMENT  IS  MADE. 120 capsule 0  . Ascorbic Acid (VITAMIN C) 1000 MG tablet Take 1,000 mg by mouth daily.    Marland Kitchen aspirin 81 MG tablet Take 81 mg by mouth daily.    . Bacillus Coagulans-Inulin (PROBIOTIC-PREBIOTIC) 1-250 BILLION-MG CAPS Take 1 capsule by mouth daily.     . Calcium Carbonate-Vitamin D 600-400 MG-UNIT per tablet Take 1 tablet by mouth daily.     . Coenzyme Q10 200 MG TABS Take 1 tablet by mouth daily.   0  . magnesium oxide (MAG-OX) 400 MG tablet Take 1 tablet (400 mg total) by mouth daily. 30 tablet 6  . Multiple Vitamin (MULTIVITAMIN) tablet Take 1 tablet by mouth daily.     . Omega-3 1000 MG CAPS Take 1 g by mouth daily.     . pravastatin (PRAVACHOL) 80 MG tablet TAKE ONE TABLET BY MOUTH ONCE DAILY IN THE EVENING 90 tablet 3  . ramipril (ALTACE) 2.5 MG capsule Take 2 capsules (5 mg total) by mouth daily. 180 capsule 3  . Budesonide (UCERIS) 9 MG TB24 Take 9 mg by mouth daily. (Patient not taking: Reported on 12/13/2015) 30 tablet 3  . metroNIDAZOLE (FLAGYL) 500 MG tablet Take 1 tablet (500 mg total) by mouth 3 (three) times daily. (Patient not taking: Reported on 12/13/2015) 30 tablet 0   No current facility-administered medications for this visit.     REVIEW OF SYSTEMS:  [X]  denotes positive finding, [ ]  denotes negative finding Cardiac  Comments:  Chest pain or chest pressure:    Shortness of breath upon exertion: x   Short of breath when lying flat:    Irregular heart rhythm:        Vascular      Pain in calf, thigh, or hip brought on by ambulation:    Pain in feet at night that wakes you up from your sleep:     Blood clot in your veins:    Leg swelling:         Pulmonary    Oxygen at home:    Productive cough:     Wheezing:         Neurologic    Sudden weakness in arms or legs:  Sudden numbness in arms or legs:     Sudden onset of difficulty speaking or slurred speech:    Temporary loss of vision in one eye:     Problems with dizziness:         Gastrointestinal    Blood in stool:     Vomited blood:         Genitourinary    Burning when urinating:     Blood in urine:        Psychiatric    Major depression:         Hematologic    Bleeding problems:    Problems with blood clotting too easily:        Skin    Rashes or ulcers:        Constitutional    Fever or chills:      PHYSICAL EXAM: Vitals:   12/13/15 1326 12/13/15 1330  BP: (!) 153/69 (!) 149/73  Pulse: 60   Resp: 16   Temp: 97.9 F (36.6 C)   SpO2: 98%   Weight: 143 lb (64.9 kg)   Height: 5' 5"  (1.651 m)     GENERAL: The patient is a well-nourished female, in no acute distress. The vital signs are documented above. CARDIAC: There is a regular rate and rhythm.  VASCULAR: No carotid bruits PULMONARY: There is good air exchange bilaterally without wheezing or rales. MUSCULOSKELETAL: There are no major deformities or cyanosis. NEUROLOGIC: No focal weakness or paresthesias are detected. SKIN: There are no ulcers or rashes noted. PSYCHIATRIC: The patient has a normal affect.  DATA:  I reviewed the carotid ultrasound from today which shows 40-69% right carotid stenosis and no significant left carotid stenosis  MEDICAL ISSUES: Asymptomatic carotid disease: The patient has had discrepancies on her ultrasounds.  Neither study suggests that she has a critical stenosis, requiring intervention.  Therefore, I have recommended that we continue with imaging surveillance.  I do not feel that alternative  imaging modalities such as CT MR or catheter-based angiography are warranted at this time.  We will continue to treat her medically and have her follow up again in 6 months with a carotid duplex.    Annamarie Major, MD [facility] [phone] Pager [phone]

## 2015-12-22 ENCOUNTER — Ambulatory Visit: Payer: PPO | Admitting: Gastroenterology

## 2015-12-24 DIAGNOSIS — R109 Unspecified abdominal pain: Secondary | ICD-10-CM | POA: Diagnosis not present

## 2015-12-31 ENCOUNTER — Telehealth: Payer: Self-pay | Admitting: Gastroenterology

## 2015-12-31 ENCOUNTER — Encounter: Payer: Self-pay | Admitting: Gastroenterology

## 2015-12-31 ENCOUNTER — Ambulatory Visit (HOSPITAL_COMMUNITY)
Admission: RE | Admit: 2015-12-31 | Discharge: 2015-12-31 | Disposition: A | Discharge status: Home / Self Care | Payer: PPO | Source: Ambulatory Visit | Attending: Gastroenterology | Admitting: Gastroenterology

## 2015-12-31 ENCOUNTER — Ambulatory Visit (INDEPENDENT_AMBULATORY_CARE_PROVIDER_SITE_OTHER): Payer: PPO | Admitting: Gastroenterology

## 2015-12-31 VITALS — BP 132/80 | HR 72 | Ht 65.75 in | Wt 142.5 lb

## 2015-12-31 DIAGNOSIS — K8020 Calculus of gallbladder without cholecystitis without obstruction: Secondary | ICD-10-CM | POA: Diagnosis not present

## 2015-12-31 DIAGNOSIS — R1011 Right upper quadrant pain: Secondary | ICD-10-CM

## 2015-12-31 NOTE — Patient Instructions (Addendum)
[place] of RUQ today.   You have been scheduled for an abdominal ultrasound at [facility] admitting on TODAY at 1:30 pm. Please arrive 15 minutes prior to your appointment for registration. Make certain not to have anything to eat or drink anything UNTIL AFTER YOUR TEST TODAY.   Should you need to reschedule your appointment, please contact radiology at [phone]. This test typically takes about 30 minutes to perform.

## 2015-12-31 NOTE — Telephone Encounter (Signed)
She has numerous gallstones. I think these are causing her pains. She needs [facility] referral (next week hopefully). If the pains become severe she needs to go to the ER.  Thanks

## 2015-12-31 NOTE — Telephone Encounter (Signed)
The pt has been given the results and the appt with [facility], she will go to the ED if pain becomes severe    You have been scheduled for an appointment with Dr [name] at [facility]. Your appointment is on 01/19/16 at 945 am. Please arrive at 915 am for registration. Make certain to bring a list of current medications, including any over the counter medications or vitamins. Also bring your co-pay if you have one as well as your insurance cards. [facility] is located at [address]. Should you need to reschedule your appointment, please contact them at [phone].

## 2015-12-31 NOTE — Progress Notes (Signed)
Review of pertinent gastrointestinal problems:  1. C. Diff coltis, eventually perforated sigmoid, s/p segmental colectomy, reversed 2/13.  2. 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 She underwent 3-4 sequential sigmoidoscopies with dilation of the stricture. Eventually dilated up to 20 mm with very good results in her diarrhea. Last examination was August 2013, this was a full colonoscopy. No polyps were seen. She did have proximal to the site of the previous stricture, the colon was somewhat edematous.  3. routine risk for colon cancer, next colonoscopy August 2023  4. Chronic colitis, July 2014 colonoscopy (mildly inflamed colon throughout, biopsies confirmed chronic inflammation). This was done for diarrhea. CC anastomosis at the time was open to 844m, repeat dilation performed. Pred 2144mdaily was started. Hot flashes, racing heart and so changed to uceris 44m52mer day. Sept 2014; started mesalamine 4.8 grams per day, with good response (apriso)   HPI: This is a very pleasant 75 85ar old woman whom I last saw about 2 months ago  Chief complaint is right upper quadrant pain  I last saw her about 2 months ago. She was having  loose stools, some urgency and some left lower quadrant discomfort. Was not clear if this was infectious or inflammatory. Stool testing for Clostridium difficile was negative, CBC and complete metabolic profile were essentially normal. I put her on a course of Flagyl. This did not seem to completely help her symptoms and so eventually   7 days flagyl, never started uceris due to expense.  However the diarrhea and urgency all improved on their own.  Two months have gone by and her bowels have been fine, still on apriso  Went to her PCP for RUQ pains, this has been for 2 weeks.  She had labs (PCP, cbc was normal).  No imaging tests. He put her on daypro, one pill once a day. The pain improved after two days of daypro, but the  pain has returned.  The pain is not associated with eating, not associated with any nausea. It is not positional     Past Medical History:  Diagnosis Date  . Anemia    "in my early teens"  . Arthritis    neck and back  . [place]rial [place] ([place]  post-op after CABG;  treated with Amiodarone and coumadin until readmxn with CDiff colitis, perf colon and elevated LFTs (amio and coumadin stopped)  . C. difficile colitis 8/28/1829complicated post CABG course with prolonged admission, perforated sigmoid colon;  s/p sigmoid colectomy with end colostomy ([facility]);  laparotomy wound infection with Pseudomonas treated with antibxs and secondary intention  . Chronic kidney disease    hx kidney stones  . Complication of anesthesia 12/2010   "had trouble waking me up after colon OR"  . Coronary artery disease    a. NSTEMI 7/12, cath: pLAD 30%, mLAD 60-70%, dLAD 80%, oOM2 50%, mOM2 40%, trifurcation 80%, pRCA 25%, then occluded, L-R collats, EF 55%;   b. 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 OweRoxy Manns. GERD (gastroesophageal reflux disease)   . H/O hiatal hernia    "slight; not having problems with it"  . Hair loss    after heart surgery  . Hemorrhoids    "none since hemorrhoid OR"  . History of bronchitis    late 1980's  . Hyperlipidemia    takes Pravastatin daily  . Hypertension    takes Metoprolol daily  . Mitral valve prolapse    was  told this in the late 1990's; ;then with Dr.Mclean he states that she doesn't  . Myocardial infarction ([employer]) 11/2010  . Palpitations 2003   hx of; Holter in 2003 with PACs and PVCs  . Pericarditis 1980   hx of  . PONV (postoperative nausea and vomiting)    w/appy  . 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 S/P colostomy ([place])    due to perf sigmoid colon in setting of CDiff colitis  . Seizures ([place])    "w/verapimil"  . Surgical wound infection    Pseudomonas; wound VAC; secondary intention    Past Surgical History:   Procedure Laterality Date  . APPENDECTOMY     as a child  . CARDIAC CATHETERIZATION  11/21/10  . CATARACT EXTRACTION, BILATERAL    . COLONOSCOPY    . COLOSTOMY RECONNECT    . colostomy reversal  06/27/11  . CORONARY ARTERY BYPASS GRAFT  11-25-2010   CABG X3; LIMA to LAD, SVG to PDA, SVG to OM, EVH via right thigh  . ESOPHAGOGASTRODUODENOSCOPY    . FLEXIBLE SIGMOIDOSCOPY  11/09/2011   Procedure: FLEXIBLE SIGMOIDOSCOPY;  Surgeon: Milus Banister, MD;  Location: [facility];  Service: Endoscopy;  Laterality: N/A;  . HEMORRHOID SURGERY  ~ 2005   x 2  . OOPHORECTOMY  2005   left; w/"mass removal"  . PROCTOSCOPY  06/27/2011   Procedure: PROCTOSCOPY;  Surgeon: Gayland Curry, MD;  Location: [facility];  Service: General;  Laterality: N/A;  . SIGMOIDECTOMY  12/18/2010   with end colostomy  . TUBAL LIGATION      Current Outpatient Prescriptions  Medication Sig Dispense Refill  . APRISO 0.375 g 24 hr capsule TAKE TWO CAPSULES BY MOUTH TWICE DAILY. DUE  FOR  FOLLOW  UP . NO  FURTHER  REFILLS  UNTIL  APPOINTMENT  IS  MADE. 120 capsule 0  . Ascorbic Acid (VITAMIN C) 1000 MG tablet Take 1,000 mg by mouth daily.    Marland Kitchen aspirin 81 MG tablet Take 81 mg by mouth daily.    . Bacillus Coagulans-Inulin (PROBIOTIC-PREBIOTIC) 1-250 BILLION-MG CAPS Take 1 capsule by mouth daily.     . Calcium Carbonate-Vitamin D 600-400 MG-UNIT per tablet Take 1 tablet by mouth daily.     . Coenzyme Q10 200 MG TABS Take 1 tablet by mouth daily.   0  . magnesium oxide (MAG-OX) 400 MG tablet Take 1 tablet (400 mg total) by mouth daily. 30 tablet 6  . Multiple Vitamin (MULTIVITAMIN) tablet Take 1 tablet by mouth daily.     . Omega-3 1000 MG CAPS Take 1 g by mouth daily.     . pravastatin (PRAVACHOL) 80 MG tablet TAKE ONE TABLET BY MOUTH ONCE DAILY IN THE EVENING 90 tablet 3  . ramipril (ALTACE) 2.5 MG capsule Take 2 capsules (5 mg total) by mouth daily. 180 capsule 3   No current facility-administered medications for this visit.      Allergies as of 12/31/2015 - Review Complete 12/31/2015  Allergen Reaction Noted  . Nadolol Hives and Anaphylaxis 08/02/2009  . Other Other (See Comments) and Anaphylaxis 06/22/2011  . Sulfa antibiotics Hives 11/20/2011  . Sulfamethoxazole Anaphylaxis 05/03/2015  . Sulfonamide derivatives Anaphylaxis   . Verapamil Other (See Comments) and Anaphylaxis     Family History  Problem Relation Age of Onset  . Heart attack Father 26    died  . Heart disease Father     massive heart attack  . Coronary artery disease  Mother 55    had bypass [place]  . Heart disease Mother   . Anesthesia problems Neg Hx   . Hypotension Neg Hx   . Malignant hyperthermia Neg Hx   . Pseudochol deficiency Neg Hx   . Colon cancer Neg Hx   . Esophageal cancer Neg Hx   . Stomach cancer Neg Hx   . Rectal cancer Neg Hx     Social History   Social History  . Marital status: Married    Spouse name: N/A  . Number of children: 1  . Years of education: N/A   Occupational History  . 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 Smoking status: Never Smoker  . Smokeless tobacco: Never Used  . Alcohol use No  . Drug use: No  . Sexual activity: Yes    Birth control/ protection: Post-menopausal   Other Topics Concern  . Not on file   Social History Narrative  . No narrative on file     Physical Exam: BP 132/80   Pulse 72   Ht 5' 5.75" (1.67 m)   Wt 142 lb 8 oz (64.6 kg)   BMI 23.18 kg/m  Constitutional: generally well-appearing Psychiatric: alert and oriented x3 Abdomen: soft, Mild right upper quadrant tenderness, nondistended, no obvious ascites, no peritoneal signs, normal bowel sounds   Assessment and plan: 75 y.o. female with right upper quadrant pain, mild right upper quadrant tenderness  I'm suspicious for symptom medical stone disease, perhaps biliary colic. She's had no fevers no nausea is been going on for a week or 2 and so I doubt acute cholecystitis. We are going  to arrange for a ultrasound of the right upper quadrant this afternoon. Further management pending those results.   Owens Loffler, MD [facility] 12/31/2015, 9:00 AM

## 2015-12-31 NOTE — Telephone Encounter (Signed)
Dr Ardis Hughs have you reviewed [place]?

## 2016-01-07 ENCOUNTER — Other Ambulatory Visit: Payer: Self-pay | Admitting: Gastroenterology

## 2016-01-19 ENCOUNTER — Ambulatory Visit: Payer: Self-pay | Admitting: General Surgery

## 2016-01-19 DIAGNOSIS — K8020 Calculus of gallbladder without cholecystitis without obstruction: Secondary | ICD-10-CM | POA: Diagnosis not present

## 2016-01-19 NOTE — H&P (Signed)
[name] 01/19/2016 9:38 AM Location: [facility] Patient #: [id number] DOB: 02/25/1941 Married / Language: English / Race: White Female   History of Present Illness [name] M. Dominique Runkel MD; 01/19/2016 10:06 AM) The patient is a 75 year old female who presents for evaluation of gall stones. She is referred by Dr. Ardis Hughs for evaluation of gallstones and right upper quadrant pain. I initially met the patient in 2012 emergently where she had perforated sigmoid diverticulitis and underwent a Hartman's procedure in August 2012. She underwent colostomy reversal in the winter of 2013. She had a postoperative anastomotic stricture that was thankfully able be dilated by Dr. Ardis Hughs. She states she had been doing well until a few weeks ago when she developed right upper quadrant pain. Initially was intermittent however now she is having more frequent symptoms. Her first episode was on August 11. She had a regular follow-up appointment with Dr. Ardis Hughs on the 18th and discussed her symptoms. He ordered an ultrasound which showed numerous gallstones without any evidence of cholecystitis. Reportedly she had normal blood work at her PCPs office prior to seeing him. She states that her flares are more frequent. She is had a flare for the past day. She denies any fever, chills, nausea or vomiting. She does report some belching along with the right upper quadrant pain. She has been following a bland diet. 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 Seizure  Disorder Ulcerative Colitis Vascular Disease  Past Surgical History Marjean Donna, CMA; 01/19/2016 9:38 AM) Appendectomy Cataract Surgery Bilateral. Coronary Artery Bypass Graft Hemorrhoidectomy Hysterectomy (not due to cancer) - Partial  Diagnostic Studies History Marjean Donna, CMA; 01/19/2016 9:38 AM) Colonoscopy 1-5 years ago Mammogram within last year Pap Smear 1-5 years ago  Allergies Davy Pique Bynum, CMA; 01/19/2016 9:44 AM) Sulfa Antibiotics Verapamil HCl *CALCIUM CHANNEL BLOCKERS*  Medication History Gayland Curry, MD; 01/19/2016 10:07 AM) Aspirin (81MG Tablet Chewable, Oral) Active. Calcium "900" w/D (Oral) Active. Fish Oil + D3 (1000-1000MG-UNIT Capsule, Oral) Active. Ramipril (2.5MG Capsule, Oral) Active. Vitamin C (100MG Tablet, Oral) Active. Probiotic + Omega-3 (Oral) Active. Apriso (0.375GM Capsule ER 24HR, Oral two times daily) Active. Pravastatin Sodium (20MG Tablet, Oral) Active. CoQ10 (10MG Capsule, Oral) Active. Multivitamin Adult (Oral) Active. Medications Reconciled OxyCODONE HCl (5MG Tablet, 1 (one) Tablet Oral every four hours, as needed, Taken starting 01/19/2016) Active.  Social History ([place]; 01/19/2016 9:38 AM) Caffeine use Tea. No alcohol use No drug use Tobacco use Never smoker.  Family History Marjean Donna, [place]; 01/19/2016 9:38 AM) Arthritis Mother. Colon Polyps Father. Diabetes Mellitus Father, Mother. Heart Disease Brother, Father, Mother, Sister. Heart disease in female family member before age 67 Hypertension Brother, Father, Mother, Sister.  Pregnancy / Birth History Marjean Donna, [place]; 01/19/2016 9:38 AM) Age at menarche 68 years. Age of menopause 26-50 Gravida 1 Length (months) of breastfeeding 3-6 Maternal age 63-30 Para 1    Review of Systems ([place]; 01/19/2016 9:38 AM) General Present- Night Sweats. Not Present- Appetite Loss, Chills, Fatigue, Fever, Weight Gain and Weight  Loss. Skin Not Present- Change in Wart/Mole, Dryness, Hives, Jaundice, New Lesions, Non-Healing Wounds, Rash and Ulcer. HEENT Present- Ringing in the  Ears and Wears glasses/contact lenses. Not Present- Earache, Hearing Loss, Hoarseness, Nose Bleed, Oral Ulcers, Seasonal Allergies, Sinus Pain, Sore Throat, Visual Disturbances and Yellow Eyes. Respiratory Not Present- Bloody sputum, Chronic Cough, Difficulty Breathing, Snoring and Wheezing. Breast Not Present- Breast Mass, Breast Pain, Nipple Discharge and Skin Changes. Cardiovascular Present- Palpitations. Not Present- Chest Pain, Difficulty Breathing Lying Down, Leg Cramps, Rapid Heart Rate, Shortness of Breath and Swelling of Extremities. Gastrointestinal Present- Excessive gas. Not Present- Abdominal Pain, Bloating, Bloody Stool, Change in Bowel Habits, Chronic diarrhea, Constipation, Difficulty Swallowing, Gets full quickly at meals, Hemorrhoids, Indigestion, Nausea, Rectal Pain and Vomiting. Female Genitourinary Not Present- Frequency, Nocturia, Painful Urination, Pelvic Pain and Urgency. Musculoskeletal Not Present- Back Pain, Joint Pain, Joint Stiffness, Muscle Pain, Muscle Weakness and Swelling of Extremities. Neurological Not Present- Decreased Memory, Fainting, Headaches, Numbness, Seizures, Tingling, Tremor, Trouble walking and Weakness. Psychiatric Not Present- Anxiety, Bipolar, Change in Sleep Pattern, Depression, Fearful and Frequent crying. Endocrine Present- Hot flashes. Not Present- Cold Intolerance, Excessive Hunger, Hair Changes, Heat Intolerance and New Diabetes. Hematology Not Present- Blood Thinners, Easy Bruising, Excessive bleeding, Gland problems, HIV and Persistent Infections.  Vitals (Sonya Bynum CMA; 01/19/2016 9:39 AM) 01/19/2016 9:39 AM Weight: 138 lb Height: 66in Body Surface Area: 1.71 m Body Mass Index: 22.27 kg/m  Temp.: 7F(Temporal)  Pulse: 79 (Regular)  BP: 126/80 (Sitting, Left Arm,  Standard)       Physical Exam [name] M. Corwin Kuiken MD; 01/19/2016 10:04 AM) General Mental Status-Alert. General Appearance-Consistent with stated age. Hydration-Well hydrated. Voice-Normal. Note: appears a little uncomfortable   Head and Neck Head-normocephalic, atraumatic with no lesions or palpable masses. Trachea-midline. Thyroid Gland Characteristics - normal size and consistency.  Eye Eyeball - Bilateral-Extraocular movements intact. Sclera/Conjunctiva - Bilateral-No scleral icterus.  Chest and Lung Exam Chest and lung exam reveals -quiet, even and easy respiratory effort with no use of accessory muscles and on auscultation, normal breath sounds, no adventitious sounds and normal vocal resonance. Inspection Chest Wall - Normal. Back - normal.  Breast - Did not examine.  Cardiovascular Cardiovascular examination reveals -normal heart sounds, regular rate and rhythm with no murmurs and normal pedal pulses bilaterally.  Abdomen Inspection Inspection of the abdomen reveals - No Hernias. Skin - Scar - Note: well healed LLQ colostomy scar, lower midline incision. no hernia. Palpation/Percussion Palpation and Percussion of the abdomen reveal - Soft, No Rebound tenderness, No Rigidity (guarding) and No hepatosplenomegaly. Note: mild TTP in RUQ. Auscultation Auscultation of the abdomen reveals - Bowel sounds normal.  Peripheral Vascular Upper Extremity Palpation - Pulses bilaterally normal.  Neurologic Neurologic evaluation reveals -alert and oriented x 3 with no impairment of recent or remote memory. Mental Status-Normal.  Neuropsychiatric The patient's mood and affect are described as -normal. Judgment and Insight-insight is appropriate concerning matters relevant to self.  Musculoskeletal Normal Exam - Left-Upper Extremity Strength Normal and Lower Extremity Strength Normal. Normal Exam - Right-Upper Extremity Strength Normal and  Lower Extremity Strength Normal.  Lymphatic Head & Neck  General Head & Neck Lymphatics: Bilateral - Description - Normal. Axillary - Did not examine. Femoral & Inguinal - Did not examine.    Assessment & Plan [name] M. Mehkai Gallo MD; 01/19/2016 10:02 AM) SYMPTOMATIC CHOLELITHIASIS (K80.20) Impression: I believe the patient's symptoms are consistent with gallbladder disease. She has been uncomfortable for the past day. However she has no fever, chills, vomiting or nausea. Nonetheless I think she is at high risk for acute infection. Because she is having more pain and I will  give her up pain prescription. We will expedite her scheduling process. We went over signs of acute cholecystitis in great detail.  We discussed gallbladder disease. The patient was given Neurosurgeon. We discussed non-operative and operative management. We discussed the signs & symptoms of acute cholecystitis  I discussed laparoscopic cholecystectomy with IOC in detail. The patient was given educational material as well as diagrams detailing the procedure. We discussed the risks and benefits of a laparoscopic cholecystectomy including, but not limited to bleeding, infection, injury to surrounding structures such as the intestine or liver, bile leak, retained gallstones, need to convert to an open procedure, prolonged diarrhea, blood clots such as DVT, common bile duct injury, anesthesia risks, and possible need for additional procedures. We discussed the typical post-operative recovery course. I explained that the likelihood of improvement of their symptoms is good.  The patient has elected to proceed with surgery. Current Plans Pt Education - Pamphlet Given - Laparoscopic Gallbladder Surgery: discussed with patient and provided information. Started OxyCODONE HCl 5MG, 1 (one) Tablet every four hours, as needed, #30, 01/19/2016, No Refill.  [name]. [name], MD, FACS General, Bariatric, & Minimally Invasive  Surgery [facility], [place]

## 2016-01-24 ENCOUNTER — Encounter (HOSPITAL_COMMUNITY)
Admission: RE | Admit: 2016-01-24 | Discharge: 2016-01-24 | Disposition: A | Discharge status: Home / Self Care | Payer: PPO | Source: Ambulatory Visit | Attending: General Surgery | Admitting: General Surgery

## 2016-01-24 ENCOUNTER — Encounter (HOSPITAL_COMMUNITY): Payer: Self-pay | Admitting: *Deleted

## 2016-01-24 DIAGNOSIS — E785 Hyperlipidemia, unspecified: Secondary | ICD-10-CM | POA: Diagnosis not present

## 2016-01-24 DIAGNOSIS — Z79899 Other long term (current) drug therapy: Secondary | ICD-10-CM | POA: Diagnosis not present

## 2016-01-24 DIAGNOSIS — Z951 Presence of aortocoronary bypass graft: Secondary | ICD-10-CM | POA: Diagnosis not present

## 2016-01-24 DIAGNOSIS — K8020 Calculus of gallbladder without cholecystitis without obstruction: Secondary | ICD-10-CM | POA: Diagnosis not present

## 2016-01-24 DIAGNOSIS — Z7982 Long term (current) use of aspirin: Secondary | ICD-10-CM | POA: Diagnosis not present

## 2016-01-24 DIAGNOSIS — I252 Old myocardial infarction: Secondary | ICD-10-CM | POA: Diagnosis not present

## 2016-01-24 DIAGNOSIS — I10 Essential (primary) hypertension: Secondary | ICD-10-CM | POA: Diagnosis not present

## 2016-01-24 DIAGNOSIS — Z6835 Body mass index (BMI) 35.0-35.9, adult: Secondary | ICD-10-CM | POA: Diagnosis not present

## 2016-01-24 DIAGNOSIS — I2510 Atherosclerotic heart disease of native coronary artery without angina pectoris: Secondary | ICD-10-CM | POA: Diagnosis not present

## 2016-01-24 DIAGNOSIS — E669 Obesity, unspecified: Secondary | ICD-10-CM | POA: Diagnosis not present

## 2016-01-24 DIAGNOSIS — K8010 Calculus of gallbladder with chronic cholecystitis without obstruction: Secondary | ICD-10-CM | POA: Diagnosis not present

## 2016-01-24 HISTORY — DX: Dysphonia: R49.0

## 2016-01-24 LAB — CBC WITH DIFFERENTIAL/PLATELET
Basophils Absolute: 0 10*3/uL (ref 0.0–0.1)
Basophils Relative: 0 %
Eosinophils Absolute: 0 10*3/uL (ref 0.0–0.7)
Eosinophils Relative: 0 %
HCT: 41.6 % (ref 36.0–46.0)
HEMOGLOBIN: 13 g/dL (ref 12.0–15.0)
LYMPHS ABS: 0.8 10*3/uL (ref 0.7–4.0)
LYMPHS PCT: 28 %
MCH: 27.6 pg (ref 26.0–34.0)
MCHC: 31.3 g/dL (ref 30.0–36.0)
MCV: 88.3 fL (ref 78.0–100.0)
Monocytes Absolute: 0.3 10*3/uL (ref 0.1–1.0)
Monocytes Relative: 8 %
NEUTROS ABS: 1.9 10*3/uL (ref 1.7–7.7)
NEUTROS PCT: 64 %
Platelets: 112 10*3/uL — ABNORMAL LOW (ref 150–400)
RBC: 4.71 MIL/uL (ref 3.87–5.11)
RDW: 14.6 % (ref 11.5–15.5)
WBC: 3 10*3/uL — AB (ref 4.0–10.5)

## 2016-01-24 LAB — COMPREHENSIVE METABOLIC PANEL
ALK PHOS: 53 U/L (ref 38–126)
ALT: 22 U/L (ref 14–54)
AST: 29 U/L (ref 15–41)
Albumin: 4.6 g/dL (ref 3.5–5.0)
Anion gap: 6 (ref 5–15)
BUN: 19 mg/dL (ref 6–20)
CALCIUM: 10.2 mg/dL (ref 8.9–10.3)
CO2: 29 mmol/L (ref 22–32)
CREATININE: 0.98 mg/dL (ref 0.44–1.00)
Chloride: 105 mmol/L (ref 101–111)
GFR calc non Af Amer: 56 mL/min — ABNORMAL LOW (ref >60)
Glucose, Bld: 95 mg/dL (ref 65–99)
Potassium: 4.6 mmol/L (ref 3.5–5.1)
Sodium: 140 mmol/L (ref 135–145)
Total Bilirubin: 0.9 mg/dL (ref 0.3–1.2)
Total Protein: 8.4 g/dL — ABNORMAL HIGH (ref 6.5–8.1)

## 2016-01-24 NOTE — Progress Notes (Signed)
05/03/2015-noted EKG in EPIC.

## 2016-01-24 NOTE — Patient Instructions (Addendum)
CONITA AMENTA  01/24/2016   Your procedure is scheduled on: Tuesday 01/25/2016  Report to [facility] Main  Entrance take [place]  elevators to 3rd floor to  [place] at   [place] AM.  Call this number if you have problems the morning of surgery [phone]   Remember: ONLY 1 PERSON MAY GO WITH YOU TO SHORT STAY TO GET  READY MORNING OF [place].   Do not eat food or drink liquids :After Midnight.     Take these medicines the morning of surgery with A SIP OF WATER: none                                 You may not have any metal on your body including hair pins and              piercings  Do not wear jewelry, make-up, lotions, powders or perfumes, deodorant             Do not wear nail polish.  Do not shave  48 hours prior to surgery.              Men may shave face and neck.   Do not bring valuables to the hospital. [place].  Contacts, dentures or bridgework may not be worn into surgery.  Leave suitcase in the car. After surgery it may be brought to your room.                  Please read over the following fact sheets you were given: _____________________________________________________________________             [facility] - Preparing for Surgery Before surgery, you can play an important role.  Because skin is not sterile, your skin needs to be as free of germs as possible.  You can reduce the number of germs on your skin by washing with CHG (chlorahexidine gluconate) soap before surgery.  CHG is an antiseptic cleaner which kills germs and bonds with the skin to continue killing germs even after washing. Please DO NOT use if you have an allergy to CHG or antibacterial soaps.  If your skin becomes reddened/irritated stop using the CHG and inform your nurse when you arrive at Short Stay. Do not shave (including legs and underarms) for at least 48 hours prior to the first CHG shower.  You may  shave your face/neck. Please follow these instructions carefully:  1.  Shower with CHG Soap the night before surgery and the  morning of Surgery.  2.  If you choose to wash your hair, wash your hair first as usual with your  normal  shampoo.  3.  After you shampoo, rinse your hair and body thoroughly to remove the  shampoo.                           4.  Use CHG as you would any other liquid soap.  You can apply chg directly  to the skin and wash                       Gently with a scrungie or clean washcloth.  5.  Apply the CHG Soap to your body ONLY FROM THE NECK DOWN.   Do not use on face/ open                           Wound or open sores. Avoid contact with eyes, ears mouth and genitals (private parts).                       Wash face,  Genitals (private parts) with your normal soap.             6.  Wash thoroughly, paying special attention to the area where your surgery  will be performed.  7.  Thoroughly rinse your body with warm water from the neck down.  8.  DO NOT shower/wash with your normal soap after using and rinsing off  the CHG Soap.                9.  Pat yourself dry with a clean towel.            10.  Wear clean pajamas.            11.  Place clean sheets on your bed the night of your first shower and do not  sleep with pets. Day of Surgery : Do not apply any lotions/deodorants the morning of surgery.  Please wear clean clothes to the hospital/surgery center.  FAILURE TO FOLLOW THESE INSTRUCTIONS MAY RESULT IN THE CANCELLATION OF YOUR SURGERY PATIENT SIGNATURE_________________________________  NURSE SIGNATURE__________________________________  ________________________________________________________________________

## 2016-01-25 ENCOUNTER — Encounter (HOSPITAL_COMMUNITY)
Admission: RE | Disposition: A | Discharge status: Home / Self Care | Payer: Self-pay | Source: Ambulatory Visit | Attending: General Surgery

## 2016-01-25 ENCOUNTER — Ambulatory Visit (HOSPITAL_COMMUNITY): Payer: PPO | Admitting: Anesthesiology

## 2016-01-25 ENCOUNTER — Ambulatory Visit (HOSPITAL_COMMUNITY): Payer: PPO

## 2016-01-25 ENCOUNTER — Encounter (HOSPITAL_COMMUNITY): Payer: Self-pay | Admitting: Anesthesiology

## 2016-01-25 ENCOUNTER — Ambulatory Visit (HOSPITAL_COMMUNITY)
Admission: RE | Admit: 2016-01-25 | Discharge: 2016-01-25 | Disposition: A | Discharge status: Home / Self Care | Payer: PPO | Source: Ambulatory Visit | Attending: General Surgery | Admitting: General Surgery

## 2016-01-25 DIAGNOSIS — Z951 Presence of aortocoronary bypass graft: Secondary | ICD-10-CM | POA: Insufficient documentation

## 2016-01-25 DIAGNOSIS — N189 Chronic kidney disease, unspecified: Secondary | ICD-10-CM | POA: Diagnosis not present

## 2016-01-25 DIAGNOSIS — Z79899 Other long term (current) drug therapy: Secondary | ICD-10-CM | POA: Diagnosis not present

## 2016-01-25 DIAGNOSIS — E669 Obesity, unspecified: Secondary | ICD-10-CM | POA: Insufficient documentation

## 2016-01-25 DIAGNOSIS — I2510 Atherosclerotic heart disease of native coronary artery without angina pectoris: Secondary | ICD-10-CM | POA: Insufficient documentation

## 2016-01-25 DIAGNOSIS — Z7982 Long term (current) use of aspirin: Secondary | ICD-10-CM | POA: Insufficient documentation

## 2016-01-25 DIAGNOSIS — K8010 Calculus of gallbladder with chronic cholecystitis without obstruction: Secondary | ICD-10-CM | POA: Diagnosis not present

## 2016-01-25 DIAGNOSIS — K5900 Constipation, unspecified: Secondary | ICD-10-CM | POA: Diagnosis not present

## 2016-01-25 DIAGNOSIS — K8020 Calculus of gallbladder without cholecystitis without obstruction: Secondary | ICD-10-CM

## 2016-01-25 DIAGNOSIS — I10 Essential (primary) hypertension: Secondary | ICD-10-CM | POA: Insufficient documentation

## 2016-01-25 DIAGNOSIS — I252 Old myocardial infarction: Secondary | ICD-10-CM | POA: Diagnosis not present

## 2016-01-25 DIAGNOSIS — Z6835 Body mass index (BMI) 35.0-35.9, adult: Secondary | ICD-10-CM | POA: Diagnosis not present

## 2016-01-25 DIAGNOSIS — E785 Hyperlipidemia, unspecified: Secondary | ICD-10-CM | POA: Insufficient documentation

## 2016-01-25 DIAGNOSIS — J449 Chronic obstructive pulmonary disease, unspecified: Secondary | ICD-10-CM | POA: Diagnosis not present

## 2016-01-25 DIAGNOSIS — I129 Hypertensive chronic kidney disease with stage 1 through stage 4 chronic kidney disease, or unspecified chronic kidney disease: Secondary | ICD-10-CM | POA: Diagnosis not present

## 2016-01-25 HISTORY — PX: CHOLECYSTECTOMY: SHX55

## 2016-01-25 SURGERY — LAPAROSCOPIC CHOLECYSTECTOMY WITH INTRAOPERATIVE CHOLANGIOGRAM
Anesthesia: General

## 2016-01-25 MED ORDER — OXYCODONE HCL 5 MG PO TABS: 5.0000 mg | ORAL_TABLET | ORAL | 0 refills | Status: DC | PRN

## 2016-01-25 MED ORDER — IOPAMIDOL (ISOVUE-300) INJECTION 61%
INTRAVENOUS | Status: AC
  Filled 2016-01-25: qty 50

## 2016-01-25 MED ORDER — SODIUM CHLORIDE 0.9% FLUSH: 3.0000 mL | Freq: Two times a day (BID) | INTRAVENOUS | Status: DC

## 2016-01-25 MED ORDER — SODIUM CHLORIDE 0.9 % IV SOLN: INTRAVENOUS | Status: DC

## 2016-01-25 MED ORDER — ACETAMINOPHEN 325 MG PO TABS: 650.0000 mg | ORAL_TABLET | ORAL | Status: DC | PRN

## 2016-01-25 MED ORDER — CHLORHEXIDINE GLUCONATE CLOTH 2 % EX PADS: 6.0000 | MEDICATED_PAD | Freq: Once | CUTANEOUS | Status: DC

## 2016-01-25 MED ORDER — ROCURONIUM BROMIDE 10 MG/ML (PF) SYRINGE
PREFILLED_SYRINGE | INTRAVENOUS | Status: DC | PRN
  Administered 2016-01-25: 40 mg via INTRAVENOUS

## 2016-01-25 MED ORDER — GLYCOPYRROLATE 0.2 MG/ML IJ SOLN
INTRAMUSCULAR | Status: DC | PRN
  Administered 2016-01-25: 0.3 mg via INTRAVENOUS

## 2016-01-25 MED ORDER — CEFOTETAN DISODIUM-DEXTROSE 2-2.08 GM-% IV SOLR
INTRAVENOUS | Status: AC
  Filled 2016-01-25: qty 50

## 2016-01-25 MED ORDER — ACETAMINOPHEN 650 MG RE SUPP
650.0000 mg | RECTAL | Status: DC | PRN
  Filled 2016-01-25: qty 1

## 2016-01-25 MED ORDER — PHENYLEPHRINE 40 MCG/ML (10ML) SYRINGE FOR IV PUSH (FOR BLOOD PRESSURE SUPPORT)
PREFILLED_SYRINGE | INTRAVENOUS | Status: DC | PRN
  Administered 2016-01-25: 80 ug via INTRAVENOUS

## 2016-01-25 MED ORDER — CEFOTETAN DISODIUM-DEXTROSE 2-2.08 GM-% IV SOLR
2.0000 g | INTRAVENOUS | Status: AC
  Administered 2016-01-25: 2 g via INTRAVENOUS

## 2016-01-25 MED ORDER — ONDANSETRON HCL 4 MG/2ML IJ SOLN
INTRAMUSCULAR | Status: DC | PRN
  Administered 2016-01-25: 4 mg via INTRAVENOUS

## 2016-01-25 MED ORDER — MORPHINE SULFATE (PF) 10 MG/ML IV SOLN: 1.0000 mg | INTRAVENOUS | Status: DC | PRN

## 2016-01-25 MED ORDER — SODIUM CHLORIDE 0.9 % IV SOLN: 250.0000 mL | INTRAVENOUS | Status: DC | PRN

## 2016-01-25 MED ORDER — LACTATED RINGERS IR SOLN
Status: DC | PRN
  Administered 2016-01-25: 1000 mL

## 2016-01-25 MED ORDER — OXYCODONE HCL 5 MG PO TABS: 5.0000 mg | ORAL_TABLET | ORAL | Status: DC | PRN

## 2016-01-25 MED ORDER — BUPIVACAINE-EPINEPHRINE 0.25% -1:200000 IJ SOLN
INTRAMUSCULAR | Status: DC | PRN
  Administered 2016-01-25: 50 mL

## 2016-01-25 MED ORDER — ACETAMINOPHEN 500 MG PO TABS
1000.0000 mg | ORAL_TABLET | ORAL | Status: AC
  Administered 2016-01-25: 1000 mg via ORAL
  Filled 2016-01-25: qty 2

## 2016-01-25 MED ORDER — PROPOFOL 10 MG/ML IV BOLUS
INTRAVENOUS | Status: AC
  Filled 2016-01-25: qty 20

## 2016-01-25 MED ORDER — LACTATED RINGERS IV SOLN
INTRAVENOUS | Status: DC
  Administered 2016-01-25: 11:00:00 via INTRAVENOUS

## 2016-01-25 MED ORDER — FENTANYL CITRATE (PF) 100 MCG/2ML IJ SOLN
INTRAMUSCULAR | Status: DC | PRN
  Administered 2016-01-25 (×4): 50 ug via INTRAVENOUS

## 2016-01-25 MED ORDER — METOPROLOL TARTRATE 5 MG/5ML IV SOLN
INTRAVENOUS | Status: AC
  Filled 2016-01-25: qty 5

## 2016-01-25 MED ORDER — FENTANYL CITRATE (PF) 100 MCG/2ML IJ SOLN
INTRAMUSCULAR | Status: AC
  Filled 2016-01-25: qty 4

## 2016-01-25 MED ORDER — LIDOCAINE 2% (20 MG/ML) 5 ML SYRINGE
INTRAMUSCULAR | Status: DC | PRN
  Administered 2016-01-25: 50 mg via INTRAVENOUS

## 2016-01-25 MED ORDER — ONDANSETRON HCL 4 MG/2ML IJ SOLN: 4.0000 mg | Freq: Once | INTRAMUSCULAR | Status: DC | PRN

## 2016-01-25 MED ORDER — SODIUM CHLORIDE 0.9 % IV SOLN
INTRAVENOUS | Status: DC | PRN
  Administered 2016-01-25: 7 mL

## 2016-01-25 MED ORDER — FENTANYL CITRATE (PF) 100 MCG/2ML IJ SOLN: 25.0000 ug | INTRAMUSCULAR | Status: DC | PRN

## 2016-01-25 MED ORDER — DIPHENHYDRAMINE HCL 50 MG/ML IJ SOLN
INTRAMUSCULAR | Status: DC | PRN
  Administered 2016-01-25: 12.5 mg via INTRAVENOUS

## 2016-01-25 MED ORDER — METOCLOPRAMIDE HCL 5 MG/ML IJ SOLN
INTRAMUSCULAR | Status: DC | PRN
  Administered 2016-01-25: 10 mg via INTRAVENOUS

## 2016-01-25 MED ORDER — DEXAMETHASONE SODIUM PHOSPHATE 4 MG/ML IJ SOLN
INTRAMUSCULAR | Status: DC | PRN
  Administered 2016-01-25: 10 mg via INTRAVENOUS

## 2016-01-25 MED ORDER — PROPOFOL 10 MG/ML IV BOLUS
INTRAVENOUS | Status: DC | PRN
  Administered 2016-01-25: 130 mg via INTRAVENOUS

## 2016-01-25 MED ORDER — BUPIVACAINE-EPINEPHRINE 0.5% -1:200000 IJ SOLN
INTRAMUSCULAR | Status: AC
  Filled 2016-01-25: qty 1

## 2016-01-25 MED ORDER — SUGAMMADEX SODIUM 200 MG/2ML IV SOLN
INTRAVENOUS | Status: DC | PRN
  Administered 2016-01-25: 200 mg via INTRAVENOUS

## 2016-01-25 MED ORDER — SODIUM CHLORIDE 0.9% FLUSH: 3.0000 mL | INTRAVENOUS | Status: DC | PRN

## 2016-01-25 SURGICAL SUPPLY — 44 items
APPLICATOR ARISTA FLEXITIP XL (MISCELLANEOUS) IMPLANT
APPLIER CLIP 5 13 M/L LIGAMAX5 (MISCELLANEOUS) ×3
APPLIER CLIP ROT 10 11.4 M/L (STAPLE)
BANDAGE ADH SHEER 1  50/CT (GAUZE/BANDAGES/DRESSINGS) IMPLANT
BENZOIN TINCTURE PRP APPL 2/3 (GAUZE/BANDAGES/DRESSINGS) ×3 IMPLANT
CABLE HIGH FREQUENCY MONO STRZ (ELECTRODE) ×3 IMPLANT
CHLORAPREP W/TINT 26ML (MISCELLANEOUS) ×3 IMPLANT
CLIP APPLIE 5 13 M/L LIGAMAX5 (MISCELLANEOUS) ×1 IMPLANT
CLIP APPLIE ROT 10 11.4 M/L (STAPLE) IMPLANT
CLOSURE STERI-STRIP 1/4X4 (GAUZE/BANDAGES/DRESSINGS) ×3 IMPLANT
COVER MAYO STAND STRL (DRAPES) ×3 IMPLANT
COVER SURGICAL LIGHT HANDLE (MISCELLANEOUS) ×3 IMPLANT
DECANTER SPIKE VIAL GLASS SM (MISCELLANEOUS) ×3 IMPLANT
DRAPE C-ARM 42X120 X-RAY (DRAPES) IMPLANT
DRSG TEGADERM 2-3/8X2-3/4 SM (GAUZE/BANDAGES/DRESSINGS) ×6 IMPLANT
ELECT PENCIL ROCKER SW 15FT (MISCELLANEOUS) ×3 IMPLANT
ELECT REM PT RETURN 9FT ADLT (ELECTROSURGICAL) ×3
ELECTRODE REM PT RTRN 9FT ADLT (ELECTROSURGICAL) ×1 IMPLANT
GAUZE SPONGE 2X2 8PLY STRL LF (GAUZE/BANDAGES/DRESSINGS) ×1 IMPLANT
GLOVE BIO SURGEON STRL SZ7.5 (GLOVE) ×3 IMPLANT
GLOVE INDICATOR 8.0 STRL GRN (GLOVE) ×3 IMPLANT
GOWN STRL REUS W/TWL XL LVL3 (GOWN DISPOSABLE) ×9 IMPLANT
HEMOSTAT ARISTA ABSORB 3G PWDR (MISCELLANEOUS) IMPLANT
HEMOSTAT SNOW SURGICEL 2X4 (HEMOSTASIS) IMPLANT
IRRIG SUCT STRYKERFLOW 2 WTIP (MISCELLANEOUS) ×3
IRRIGATION SUCT STRKRFLW 2 WTP (MISCELLANEOUS) ×1 IMPLANT
KIT BASIN OR (CUSTOM PROCEDURE TRAY) ×3 IMPLANT
L-HOOK LAP DISP 36CM (ELECTROSURGICAL) ×3
LHOOK LAP DISP 36CM (ELECTROSURGICAL) ×1 IMPLANT
POUCH RETRIEVAL ECOSAC 10 (ENDOMECHANICALS) ×1 IMPLANT
POUCH RETRIEVAL ECOSAC 10MM (ENDOMECHANICALS) ×2
SCISSORS LAP 5X35 DISP (ENDOMECHANICALS) ×3 IMPLANT
SET CHOLANGIOGRAPH MIX (MISCELLANEOUS) IMPLANT
SLEEVE XCEL OPT CAN 5 100 (ENDOMECHANICALS) ×6 IMPLANT
SPONGE GAUZE 2X2 STER 10/PKG (GAUZE/BANDAGES/DRESSINGS) ×2
SUT MNCRL AB 4-0 PS2 18 (SUTURE) ×3 IMPLANT
SUT VICRYL 0 UR6 27IN ABS (SUTURE) ×3 IMPLANT
TOWEL OR 17X26 10 PK STRL BLUE (TOWEL DISPOSABLE) ×3 IMPLANT
TOWEL OR NON WOVEN STRL DISP B (DISPOSABLE) ×3 IMPLANT
TRAY LAPAROSCOPIC (CUSTOM PROCEDURE TRAY) ×3 IMPLANT
TROCAR BLADELESS OPT 5 100 (ENDOMECHANICALS) ×3 IMPLANT
TROCAR XCEL BLUNT TIP 100MML (ENDOMECHANICALS) ×3 IMPLANT
TROCAR XCEL NON-BLD 11X100MML (ENDOMECHANICALS) IMPLANT
TUBING INSUF HEATED (TUBING) ×3 IMPLANT

## 2016-01-25 NOTE — Op Note (Signed)
[name] [id number] Feb 23, 1941 01/25/2016  Laparoscopic Cholecystectomy with IOC Procedure Note  Indications: This patient presents with symptomatic gallbladder disease and will undergo laparoscopic cholecystectomy.  Pre-operative Diagnosis: symptomatic cholelithiasis  Post-operative Diagnosis: Same  Surgeon: Gayland Curry   Assistants: Gurney Maxin MD  Anesthesia: General endotracheal anesthesia  Procedure Details  The patient was seen again in the Holding Room. The risks, benefits, complications, treatment options, and expected outcomes were discussed with the patient. The possibilities of reaction to medication, pulmonary aspiration, perforation of viscus, bleeding, recurrent infection, finding a normal gallbladder, the need for additional procedures, failure to diagnose a condition, the possible need to convert to an open procedure, and creating a complication requiring transfusion or operation were discussed with the patient. The likelihood of improving the patient's symptoms with return to their baseline status is good.  The patient and/or family concurred with the proposed plan, giving informed consent. The site of surgery properly noted. The patient was taken to Operating Room, identified as [name] and the procedure verified as Laparoscopic Cholecystectomy with Intraoperative Cholangiogram. A Time Out was held and the above information confirmed. Antibiotic prophylaxis was administered.   Prior to the induction of general anesthesia, antibiotic prophylaxis was administered. General endotracheal anesthesia was then administered and tolerated well. After the induction, the abdomen was prepped with Chloraprep and draped in the sterile fashion. The patient was positioned in the supine position.  Local anesthetic agent was injected into the skin near the umbilicus and an incision made thru an old midline incision. We dissected down to the abdominal fascia with blunt  dissection.  The fascia was incised vertically and we entered the peritoneal cavity bluntly.  A pursestring suture of 0-Vicryl was placed around the fascial opening.  The Hasson cannula was inserted and secured with the stay suture.  Pneumoperitoneum was then created with CO2 and tolerated well without any adverse changes in the patient's vital signs. An 5-mm port was placed in the subxiphoid position.  Two 5-mm ports were placed in the right upper quadrant. All skin incisions were infiltrated with a local anesthetic agent before making the incision and placing the trocars.   We positioned the patient in reverse Trendelenburg, tilted slightly to the patient's left.  The gallbladder was identified, the fundus grasped and retracted cephalad. Adhesions were lysed bluntly and with the electrocautery where indicated, taking care not to injure any adjacent organs or viscus. The infundibulum was grasped and retracted laterally, exposing the peritoneum overlying the triangle of Calot. This was then divided and exposed in a blunt fashion. A critical view of the cystic duct and cystic artery was obtained.  The cystic duct was clearly identified and bluntly dissected circumferentially. The cystic duct was ligated with a clip distally. The cystic artery which had been identified & dissected free was ligated with clips.   An incision was made in the cystic duct and the [facility] cholangiogram catheter introduced. The catheter was secured using a clip. A cholangiogram was then obtained which showed good visualization of the distal and proximal biliary tree with no sign of filling defects or obstruction.  Contrast flowed easily into the duodenum. The catheter was then removed.   The cystic duct was then ligated with clips and divided.  The cystic artery was divided as well.   The gallbladder was dissected from the liver bed in retrograde fashion with the electrocautery. The gallbladder was removed and placed in an Ecco sac.  The  gallbladder and Ecco sac were then  removed through the umbilical port site. The liver bed was irrigated and inspected. Hemostasis was achieved with the electrocautery. Copious irrigation was utilized and was repeatedly aspirated until clear.  The pursestring suture was used to close the umbilical fascia.    We again inspected the right upper quadrant for hemostasis.  The umbilical closure was inspected and there was no air leak and nothing trapped within the closure. But I did placed an additional interrupted 0 vicryl suture at the umbilical fascia. There were a little omental adhesions in the lower midline from prior surgery.  Pneumoperitoneum was released as we removed the trocars.  4-0 Monocryl was used to close the skin.   Benzoin, steri-strips, and clean dressings were applied. The patient was then extubated and brought to the recovery room in stable condition. Instrument, sponge, and needle counts were correct at closure and at the conclusion of the case.   Findings: Chronic Cholecystitis with Cholelithiasis  Estimated Blood Loss: Minimal         Drains: none         Specimens: Gallbladder           Complications: None; patient tolerated the procedure well.         Disposition: PACU - hemodynamically stable.         Condition: stable  [name]. [name], MD, FACS General, Bariatric, & Minimally Invasive Surgery [facility], [place]

## 2016-01-25 NOTE — Discharge Instructions (Signed)
[facility] LAP[facility]: POST OP INSTRUCTIONS Always review your discharge instruction sheet given to you by the facility where your surgery was performed. IF YOU HAVE DISABILITY OR FAMILY LEAVE FORMS, YOU MUST BRING THEM TO THE OFFICE FOR PROCESSING.   DO NOT GIVE THEM TO YOUR DOCTOR.  1. A prescription for pain medication may be given to you upon discharge.  Take your pain medication as prescribed, if needed.  If narcotic pain medicine is not needed, then you may take acetaminophen (Tylenol) or ibuprofen (Advil) as needed. 2. Take your usually prescribed medications unless otherwise directed. 3. If you need a refill on your pain medication, please contact your pharmacy.  They will contact our office to request authorization. Prescriptions will not be filled after 5pm or on week-ends. 4. You should follow a light diet the first few days after arrival home, such as soup and crackers, etc.  Be sure to include lots of fluids daily. 5. Most patients will experience some swelling and bruising in the area of the incisions.  Ice packs will help.  Swelling and bruising can take several days to resolve.  6. It is common to experience some constipation if taking pain medication after surgery.  Increasing fluid intake and taking a stool softener (such as Colace) will usually help or prevent this problem from occurring.  A mild laxative (Milk of Magnesia or Miralax) should be taken according to package instructions if there are no bowel movements after 48 hours. 7. Unless discharge instructions indicate otherwise, you may remove your bandages 48 hours after surgery, and you may shower at that time.  You  have steri-strips (small skin tapes) in place directly over the incision.   8. ACTIVITIES:  You may resume regular (light) daily activities beginning the next day--such as daily self-care, walking, climbing stairs--gradually increasing activities as tolerated.  You may have sexual  intercourse when it is comfortable.  Refrain from any heavy lifting or straining until approved by your doctor. a. You may drive when you are no longer taking prescription pain medication, you can comfortably wear a seatbelt, and you can safely maneuver your car and apply brakes. 9. You should see your doctor in the office for a follow-up appointment approximately 2-3 weeks after your surgery.  Make sure that you call for this appointment within a day or two after you arrive home to insure a convenient appointment time. 10. OTHER INSTRUCTIONS:  WHEN TO CALL YOUR DOCTOR: 1. Fever over 101.0 2. Inability to urinate 3. Continued bleeding from incision. 4. Increased pain, redness, or drainage from the incision. 5. Increasing abdominal pain  The clinic staff is available to answer your questions during regular business hours.  Please dont hesitate to call and ask to speak to one of the nurses for clinical concerns.  If you have a medical emergency, go to the nearest emergency room or call 911.  A surgeon from [facility] is always on call at the hospital. [address], [place], [place], [place]  [address] ? [address] [place], [place],    [address] [phone] ? [phone] ? FAX [phone] Web site: [email or web address]

## 2016-01-25 NOTE — Transfer of Care (Signed)
Immediate Anesthesia Transfer of Care Note  Patient: [name]  Procedure(s) Performed: Procedure(s): LAPAROSCOPIC CHOLECYSTECTOMY WITH INTRAOPERATIVE CHOLANGIOGRAM (N/A)  Patient Location: PACU  Anesthesia Type:General  Level of Consciousness: Patient easily awoken, sedated, comfortable, cooperative, following commands, responds to stimulation.   Airway & Oxygen Therapy: Patient spontaneously breathing, ventilating well, oxygen via simple oxygen mask.  Post-op Assessment: Report given to PACU RN, vital signs reviewed and stable, moving all extremities.   Post vital signs: Reviewed and stable.  Complications: No apparent anesthesia complications Last Vitals:  Vitals:   01/25/16 0822  BP: (!) 176/67  Pulse: 66  Resp: 16  Temp: 36.7 C    Last Pain:  Vitals:   01/25/16 0822  TempSrc: Oral  PainSc: 5       Patients Stated Pain Goal: 4 (57/84/69 [id number])  Complications: No apparent anesthesia complications

## 2016-01-25 NOTE — H&P (View-Only) (Signed)
Muenster Bartelt 01/19/2016 9:38 AM Location: [facility] Patient #: [id number] DOB: 1941/02/01 Married / Language: English / Race: White Female   History of Present Illness Randall Hiss M. Blonnie Maske MD; 01/19/2016 10:06 AM) The patient is a 75 year old female who presents for evaluation of gall stones. She is referred by Dr. Ardis Hughs for evaluation of gallstones and right upper quadrant pain. I initially met the patient in 2012 emergently where she had perforated sigmoid diverticulitis and underwent a Hartman's procedure in August 2012. She underwent colostomy reversal in the winter of 2013. She had a postoperative anastomotic stricture that was thankfully able be dilated by Dr. Ardis Hughs. She states she had been doing well until a few weeks ago when she developed right upper quadrant pain. Initially was intermittent however now she is having more frequent symptoms. Her first episode was on August 11. She had a regular follow-up appointment with Dr. Ardis Hughs on the 18th and discussed her symptoms. He ordered an ultrasound which showed numerous gallstones without any evidence of cholecystitis. Reportedly she had normal blood work at her PCPs office prior to seeing him. She states that her flares are more frequent. She is had a flare for the past day. She denies any fever, chills, nausea or vomiting. She does report some belching along with the right upper quadrant pain. She has been following a bland diet. 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 Seizure  Disorder Ulcerative Colitis Vascular Disease  Past Surgical History Marjean Donna, CMA; 01/19/2016 9:38 AM) Appendectomy Cataract Surgery Bilateral. Coronary Artery Bypass Graft Hemorrhoidectomy Hysterectomy (not due to cancer) - Partial  Diagnostic Studies History Marjean Donna, CMA; 01/19/2016 9:38 AM) Colonoscopy 1-5 years ago Mammogram within last year Pap Smear 1-5 years ago  Allergies Davy Pique Bynum, CMA; 01/19/2016 9:44 AM) Sulfa Antibiotics Verapamil HCl *CALCIUM CHANNEL BLOCKERS*  Medication History Gayland Curry, MD; 01/19/2016 10:07 AM) Aspirin (81MG Tablet Chewable, Oral) Active. Calcium "900" w/D (Oral) Active. Fish Oil + D3 (1000-1000MG-UNIT Capsule, Oral) Active. Ramipril (2.5MG Capsule, Oral) Active. Vitamin C (100MG Tablet, Oral) Active. Probiotic + Omega-3 (Oral) Active. Apriso (0.375GM Capsule ER 24HR, Oral two times daily) Active. Pravastatin Sodium (20MG Tablet, Oral) Active. CoQ10 (10MG Capsule, Oral) Active. Multivitamin Adult (Oral) Active. Medications Reconciled OxyCODONE HCl (5MG Tablet, 1 (one) Tablet Oral every four hours, as needed, Taken starting 01/19/2016) Active.  Social History ([place]; 01/19/2016 9:38 AM) Caffeine use Tea. No alcohol use No drug use Tobacco use Never smoker.  Family History Marjean Donna, [place]; 01/19/2016 9:38 AM) Arthritis Mother. Colon Polyps Father. Diabetes Mellitus Father, Mother. Heart Disease Brother, Father, Mother, Sister. Heart disease in female family member before age 20 Hypertension Brother, Father, Mother, Sister.  Pregnancy / Birth History Marjean Donna, [place]; 01/19/2016 9:38 AM) Age at menarche 30 years. Age of menopause 37-50 Gravida 1 Length (months) of breastfeeding 3-6 Maternal age 45-30 Para 1    Review of Systems ([place]; 01/19/2016 9:38 AM) General Present- Night Sweats. Not Present- Appetite Loss, Chills, Fatigue, Fever, Weight Gain and Weight  Loss. Skin Not Present- Change in Wart/Mole, Dryness, Hives, Jaundice, New Lesions, Non-Healing Wounds, Rash and Ulcer. HEENT Present- Ringing in the  Ears and Wears glasses/contact lenses. Not Present- Earache, Hearing Loss, Hoarseness, Nose Bleed, Oral Ulcers, Seasonal Allergies, Sinus Pain, Sore Throat, Visual Disturbances and Yellow Eyes. Respiratory Not Present- Bloody sputum, Chronic Cough, Difficulty Breathing, Snoring and Wheezing. Breast Not Present- Breast Mass, Breast Pain, Nipple Discharge and Skin Changes. Cardiovascular Present- Palpitations. Not Present- Chest Pain, Difficulty Breathing Lying Down, Leg Cramps, Rapid Heart Rate, Shortness of Breath and Swelling of Extremities. Gastrointestinal Present- Excessive gas. Not Present- Abdominal Pain, Bloating, Bloody Stool, Change in Bowel Habits, Chronic diarrhea, Constipation, Difficulty Swallowing, Gets full quickly at meals, Hemorrhoids, Indigestion, Nausea, Rectal Pain and Vomiting. Female Genitourinary Not Present- Frequency, Nocturia, Painful Urination, Pelvic Pain and Urgency. Musculoskeletal Not Present- Back Pain, Joint Pain, Joint Stiffness, Muscle Pain, Muscle Weakness and Swelling of Extremities. Neurological Not Present- Decreased Memory, Fainting, Headaches, Numbness, Seizures, Tingling, Tremor, Trouble walking and Weakness. Psychiatric Not Present- Anxiety, Bipolar, Change in Sleep Pattern, Depression, Fearful and Frequent crying. Endocrine Present- Hot flashes. Not Present- Cold Intolerance, Excessive Hunger, Hair Changes, Heat Intolerance and New Diabetes. Hematology Not Present- Blood Thinners, Easy Bruising, Excessive bleeding, Gland problems, HIV and Persistent Infections.  Vitals (Sonya Bynum CMA; 01/19/2016 9:39 AM) 01/19/2016 9:39 AM Weight: 138 lb Height: 66in Body Surface Area: 1.71 m Body Mass Index: 22.27 kg/m  Temp.: 78F(Temporal)  Pulse: 79 (Regular)  BP: 126/80 (Sitting, Left Arm,  Standard)       Physical Exam Randall Hiss M. Kacee Koren MD; 01/19/2016 10:04 AM) General Mental Status-Alert. General Appearance-Consistent with stated age. Hydration-Well hydrated. Voice-Normal. Note: appears a little uncomfortable   Head and Neck Head-normocephalic, atraumatic with no lesions or palpable masses. Trachea-midline. Thyroid Gland Characteristics - normal size and consistency.  Eye Eyeball - Bilateral-Extraocular movements intact. Sclera/Conjunctiva - Bilateral-No scleral icterus.  Chest and Lung Exam Chest and lung exam reveals -quiet, even and easy respiratory effort with no use of accessory muscles and on auscultation, normal breath sounds, no adventitious sounds and normal vocal resonance. Inspection Chest Wall - Normal. Back - normal.  Breast - Did not examine.  Cardiovascular Cardiovascular examination reveals -normal heart sounds, regular rate and rhythm with no murmurs and normal pedal pulses bilaterally.  Abdomen Inspection Inspection of the abdomen reveals - No Hernias. Skin - Scar - Note: well healed LLQ colostomy scar, lower midline incision. no hernia. Palpation/Percussion Palpation and Percussion of the abdomen reveal - Soft, No Rebound tenderness, No Rigidity (guarding) and No hepatosplenomegaly. Note: mild TTP in RUQ. Auscultation Auscultation of the abdomen reveals - Bowel sounds normal.  Peripheral Vascular Upper Extremity Palpation - Pulses bilaterally normal.  Neurologic Neurologic evaluation reveals -alert and oriented x 3 with no impairment of recent or remote memory. Mental Status-Normal.  Neuropsychiatric The patient's mood and affect are described as -normal. Judgment and Insight-insight is appropriate concerning matters relevant to self.  Musculoskeletal Normal Exam - Left-Upper Extremity Strength Normal and Lower Extremity Strength Normal. Normal Exam - Right-Upper Extremity Strength Normal and  Lower Extremity Strength Normal.  Lymphatic Head & Neck  General Head & Neck Lymphatics: Bilateral - Description - Normal. Axillary - Did not examine. Femoral & Inguinal - Did not examine.    Assessment & Plan Randall Hiss M. Addilyne Backs MD; 01/19/2016 10:02 AM) SYMPTOMATIC CHOLELITHIASIS (K80.20) Impression: I believe the patient's symptoms are consistent with gallbladder disease. She has been uncomfortable for the past day. However she has no fever, chills, vomiting or nausea. Nonetheless I think she is at high risk for acute infection. Because she is having more pain and I will  give her up pain prescription. We will expedite her scheduling process. We went over signs of acute cholecystitis in great detail.  We discussed gallbladder disease. The patient was given Neurosurgeon. We discussed non-operative and operative management. We discussed the signs & symptoms of acute cholecystitis  I discussed laparoscopic cholecystectomy with IOC in detail. The patient was given educational material as well as diagrams detailing the procedure. We discussed the risks and benefits of a laparoscopic cholecystectomy including, but not limited to bleeding, infection, injury to surrounding structures such as the intestine or liver, bile leak, retained gallstones, need to convert to an open procedure, prolonged diarrhea, blood clots such as DVT, common bile duct injury, anesthesia risks, and possible need for additional procedures. We discussed the typical post-operative recovery course. I explained that the likelihood of improvement of their symptoms is good.  The patient has elected to proceed with surgery. Current Plans Pt Education - Pamphlet Given - Laparoscopic Gallbladder Surgery: discussed with patient and provided information. Started OxyCODONE HCl 5MG, 1 (one) Tablet every four hours, as needed, #30, 01/19/2016, No Refill.  [name]. [name], MD, FACS General, Bariatric, & Minimally Invasive  Surgery [facility], [place]

## 2016-01-25 NOTE — Anesthesia Postprocedure Evaluation (Signed)
Anesthesia Post Note  Patient: [name]  Procedure(s) Performed: Procedure(s) (LRB): LAPAROSCOPIC CHOLECYSTECTOMY WITH INTRAOPERATIVE CHOLANGIOGRAM (N/A)  Patient location during evaluation: PACU Anesthesia Type: General Level of consciousness: awake and alert Pain management: pain level controlled Vital Signs Assessment: post-procedure vital signs reviewed and stable Respiratory status: spontaneous breathing, nonlabored ventilation and respiratory function stable Cardiovascular status: blood pressure returned to baseline and stable Postop Assessment: no signs of nausea or vomiting Anesthetic complications: no    Last Vitals:  Vitals:   01/25/16 1355 01/25/16 1434  BP: (!) 140/52 (!) 150/85  Pulse: 74 73  Resp: 13 16  Temp: 37 C 36.8 C    Last Pain:  Vitals:   01/25/16 1434  TempSrc: Oral  PainSc:                  [name]

## 2016-01-25 NOTE — Interval H&P Note (Signed)
History and Physical Interval Note:  01/25/2016 11:11 AM  [name]  has presented today for surgery, with the diagnosis of Symptomatic chole  The various methods of treatment have been discussed with the patient and family. After consideration of risks, benefits and other options for treatment, the patient has consented to  Procedure(s): LAPAROSCOPIC CHOLECYSTECTOMY WITH INTRAOPERATIVE CHOLANGIOGRAM (N/A) as a surgical intervention .  The patient's history has been reviewed, patient examined, no change in status, stable for surgery.  I have reviewed the patient's chart and labs.  Questions were answered to the patient's satisfaction.    [name]. [name], MD, [place], Bariatric, & Minimally Invasive Surgery [facility], [place]    [facility] M

## 2016-01-25 NOTE — Anesthesia Preprocedure Evaluation (Addendum)
Anesthesia Evaluation  Patient identified by MRN, date of birth, ID band Patient awake    Reviewed: Allergy & Precautions, NPO status , Patient's Chart, lab work & pertinent test results  History of Anesthesia Complications (+) PONV and history of anesthetic complications  Airway Mallampati: I  TM Distance: >3 FB Neck ROM: Full    Dental  (+) Dental Advisory Given, Teeth Intact   Pulmonary neg pulmonary ROS, neg shortness of breath, neg sleep apnea, neg COPD, neg recent URI,    Pulmonary exam normal breath sounds clear to auscultation       Cardiovascular hypertension, Pt. 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 Exercise testing with gas exchange demonstrates normal functional capacity when compared to matched sedentary norms. There does not appear to be any ventilatory or cardiac limitations. There was a significant hypertensive response occuring at low intensity exercise/activity. Note there were frequent PVCs during exercise and recovery.    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   Dental advisory given  Plan Discussed with: CRNA  Anesthesia Plan Comments: (Risks of general anesthesia discussed including, but not limited to, sore throat, hoarse voice, chipped/damaged teeth, injury to vocal cords, nausea and vomiting, allergic reactions, lung infection, heart attack, stroke, and death. All questions answered. )       Anesthesia Quick Evaluation

## 2016-01-25 NOTE — Anesthesia Procedure Notes (Signed)
Procedure Name: Intubation Date/Time: 01/25/2016 11:42 AM Performed by: Deliah Boston Pre-anesthesia Checklist: Patient identified, Emergency Drugs available, Suction available and Patient being monitored Patient Re-evaluated:Patient Re-evaluated prior to inductionOxygen Delivery Method: Circle system utilized Preoxygenation: Pre-oxygenation with 100% oxygen Intubation Type: IV induction Ventilation: Mask ventilation without difficulty and Oral airway inserted - appropriate to patient size Laryngoscope Size: Mac and 3 Grade View: Grade II Tube type: Oral Tube size: 7.0 mm Number of attempts: 1 Airway Equipment and Method: Stylet and Oral airway Placement Confirmation: ETT inserted through vocal cords under direct vision,  positive ETCO2 and breath sounds checked- equal and bilateral Secured at: 22 cm Tube secured with: Tape Dental Injury: Teeth and Oropharynx as per pre-operative assessment

## 2016-02-07 ENCOUNTER — Other Ambulatory Visit: Payer: Self-pay | Admitting: Gastroenterology

## 2016-03-07 DIAGNOSIS — H26493 Other secondary cataract, bilateral: Secondary | ICD-10-CM | POA: Diagnosis not present

## 2016-03-07 DIAGNOSIS — D3131 Benign neoplasm of right choroid: Secondary | ICD-10-CM | POA: Diagnosis not present

## 2016-03-07 DIAGNOSIS — H5202 Hypermetropia, left eye: Secondary | ICD-10-CM | POA: Diagnosis not present

## 2016-03-14 DIAGNOSIS — Z23 Encounter for immunization: Secondary | ICD-10-CM | POA: Diagnosis not present

## 2016-03-24 DIAGNOSIS — H02839 Dermatochalasis of unspecified eye, unspecified eyelid: Secondary | ICD-10-CM | POA: Diagnosis not present

## 2016-03-24 DIAGNOSIS — H26493 Other secondary cataract, bilateral: Secondary | ICD-10-CM | POA: Diagnosis not present

## 2016-03-24 DIAGNOSIS — H35371 Puckering of macula, right eye: Secondary | ICD-10-CM | POA: Diagnosis not present

## 2016-03-24 DIAGNOSIS — Z960 Presence of urogenital implants: Secondary | ICD-10-CM | POA: Diagnosis not present

## 2016-03-24 DIAGNOSIS — H26492 Other secondary cataract, left eye: Secondary | ICD-10-CM | POA: Diagnosis not present

## 2016-03-29 ENCOUNTER — Other Ambulatory Visit: Payer: Self-pay | Admitting: *Deleted

## 2016-03-29 DIAGNOSIS — I6523 Occlusion and stenosis of bilateral carotid arteries: Secondary | ICD-10-CM

## 2016-04-04 ENCOUNTER — Other Ambulatory Visit: Payer: Self-pay | Admitting: Gastroenterology

## 2016-04-11 DIAGNOSIS — M9901 Segmental and somatic dysfunction of cervical region: Secondary | ICD-10-CM | POA: Diagnosis not present

## 2016-04-11 DIAGNOSIS — M9903 Segmental and somatic dysfunction of lumbar region: Secondary | ICD-10-CM | POA: Diagnosis not present

## 2016-04-11 DIAGNOSIS — S161XXA Strain of muscle, fascia and tendon at neck level, initial encounter: Secondary | ICD-10-CM | POA: Diagnosis not present

## 2016-04-11 DIAGNOSIS — S336XXA Sprain of sacroiliac joint, initial encounter: Secondary | ICD-10-CM | POA: Diagnosis not present

## 2016-04-11 DIAGNOSIS — M9902 Segmental and somatic dysfunction of thoracic region: Secondary | ICD-10-CM | POA: Diagnosis not present

## 2016-04-11 DIAGNOSIS — S39012A Strain of muscle, fascia and tendon of lower back, initial encounter: Secondary | ICD-10-CM | POA: Diagnosis not present

## 2016-04-11 DIAGNOSIS — S233XXD Sprain of ligaments of thoracic spine, subsequent encounter: Secondary | ICD-10-CM | POA: Diagnosis not present

## 2016-04-12 DIAGNOSIS — S39012A Strain of muscle, fascia and tendon of lower back, initial encounter: Secondary | ICD-10-CM | POA: Diagnosis not present

## 2016-04-12 DIAGNOSIS — S161XXA Strain of muscle, fascia and tendon at neck level, initial encounter: Secondary | ICD-10-CM | POA: Diagnosis not present

## 2016-04-12 DIAGNOSIS — M9901 Segmental and somatic dysfunction of cervical region: Secondary | ICD-10-CM | POA: Diagnosis not present

## 2016-04-12 DIAGNOSIS — M9903 Segmental and somatic dysfunction of lumbar region: Secondary | ICD-10-CM | POA: Diagnosis not present

## 2016-04-12 DIAGNOSIS — S336XXA Sprain of sacroiliac joint, initial encounter: Secondary | ICD-10-CM | POA: Diagnosis not present

## 2016-04-12 DIAGNOSIS — S233XXD Sprain of ligaments of thoracic spine, subsequent encounter: Secondary | ICD-10-CM | POA: Diagnosis not present

## 2016-04-12 DIAGNOSIS — M9902 Segmental and somatic dysfunction of thoracic region: Secondary | ICD-10-CM | POA: Diagnosis not present

## 2016-04-13 DIAGNOSIS — S39012A Strain of muscle, fascia and tendon of lower back, initial encounter: Secondary | ICD-10-CM | POA: Diagnosis not present

## 2016-04-13 DIAGNOSIS — M9903 Segmental and somatic dysfunction of lumbar region: Secondary | ICD-10-CM | POA: Diagnosis not present

## 2016-04-13 DIAGNOSIS — S336XXA Sprain of sacroiliac joint, initial encounter: Secondary | ICD-10-CM | POA: Diagnosis not present

## 2016-04-13 DIAGNOSIS — M9902 Segmental and somatic dysfunction of thoracic region: Secondary | ICD-10-CM | POA: Diagnosis not present

## 2016-04-13 DIAGNOSIS — S233XXD Sprain of ligaments of thoracic spine, subsequent encounter: Secondary | ICD-10-CM | POA: Diagnosis not present

## 2016-04-13 DIAGNOSIS — M9901 Segmental and somatic dysfunction of cervical region: Secondary | ICD-10-CM | POA: Diagnosis not present

## 2016-04-13 DIAGNOSIS — S161XXA Strain of muscle, fascia and tendon at neck level, initial encounter: Secondary | ICD-10-CM | POA: Diagnosis not present

## 2016-04-18 DIAGNOSIS — S161XXA Strain of muscle, fascia and tendon at neck level, initial encounter: Secondary | ICD-10-CM | POA: Diagnosis not present

## 2016-04-18 DIAGNOSIS — S233XXD Sprain of ligaments of thoracic spine, subsequent encounter: Secondary | ICD-10-CM | POA: Diagnosis not present

## 2016-04-18 DIAGNOSIS — S39012A Strain of muscle, fascia and tendon of lower back, initial encounter: Secondary | ICD-10-CM | POA: Diagnosis not present

## 2016-04-18 DIAGNOSIS — S336XXA Sprain of sacroiliac joint, initial encounter: Secondary | ICD-10-CM | POA: Diagnosis not present

## 2016-04-18 DIAGNOSIS — M9901 Segmental and somatic dysfunction of cervical region: Secondary | ICD-10-CM | POA: Diagnosis not present

## 2016-04-18 DIAGNOSIS — M9902 Segmental and somatic dysfunction of thoracic region: Secondary | ICD-10-CM | POA: Diagnosis not present

## 2016-04-18 DIAGNOSIS — M9903 Segmental and somatic dysfunction of lumbar region: Secondary | ICD-10-CM | POA: Diagnosis not present

## 2016-04-19 DIAGNOSIS — S233XXD Sprain of ligaments of thoracic spine, subsequent encounter: Secondary | ICD-10-CM | POA: Diagnosis not present

## 2016-04-19 DIAGNOSIS — M9902 Segmental and somatic dysfunction of thoracic region: Secondary | ICD-10-CM | POA: Diagnosis not present

## 2016-04-19 DIAGNOSIS — M9901 Segmental and somatic dysfunction of cervical region: Secondary | ICD-10-CM | POA: Diagnosis not present

## 2016-04-19 DIAGNOSIS — M9903 Segmental and somatic dysfunction of lumbar region: Secondary | ICD-10-CM | POA: Diagnosis not present

## 2016-04-19 DIAGNOSIS — S39012A Strain of muscle, fascia and tendon of lower back, initial encounter: Secondary | ICD-10-CM | POA: Diagnosis not present

## 2016-04-19 DIAGNOSIS — S336XXA Sprain of sacroiliac joint, initial encounter: Secondary | ICD-10-CM | POA: Diagnosis not present

## 2016-04-19 DIAGNOSIS — S161XXA Strain of muscle, fascia and tendon at neck level, initial encounter: Secondary | ICD-10-CM | POA: Diagnosis not present

## 2016-04-20 DIAGNOSIS — S161XXA Strain of muscle, fascia and tendon at neck level, initial encounter: Secondary | ICD-10-CM | POA: Diagnosis not present

## 2016-04-20 DIAGNOSIS — S336XXA Sprain of sacroiliac joint, initial encounter: Secondary | ICD-10-CM | POA: Diagnosis not present

## 2016-04-20 DIAGNOSIS — S233XXD Sprain of ligaments of thoracic spine, subsequent encounter: Secondary | ICD-10-CM | POA: Diagnosis not present

## 2016-04-20 DIAGNOSIS — M9902 Segmental and somatic dysfunction of thoracic region: Secondary | ICD-10-CM | POA: Diagnosis not present

## 2016-04-20 DIAGNOSIS — M9903 Segmental and somatic dysfunction of lumbar region: Secondary | ICD-10-CM | POA: Diagnosis not present

## 2016-04-20 DIAGNOSIS — S39012A Strain of muscle, fascia and tendon of lower back, initial encounter: Secondary | ICD-10-CM | POA: Diagnosis not present

## 2016-04-20 DIAGNOSIS — M9901 Segmental and somatic dysfunction of cervical region: Secondary | ICD-10-CM | POA: Diagnosis not present

## 2016-04-21 DIAGNOSIS — H26491 Other secondary cataract, right eye: Secondary | ICD-10-CM | POA: Diagnosis not present

## 2016-04-21 DIAGNOSIS — H26493 Other secondary cataract, bilateral: Secondary | ICD-10-CM | POA: Diagnosis not present

## 2016-04-25 DIAGNOSIS — M9901 Segmental and somatic dysfunction of cervical region: Secondary | ICD-10-CM | POA: Diagnosis not present

## 2016-04-25 DIAGNOSIS — S39012A Strain of muscle, fascia and tendon of lower back, initial encounter: Secondary | ICD-10-CM | POA: Diagnosis not present

## 2016-04-25 DIAGNOSIS — S233XXD Sprain of ligaments of thoracic spine, subsequent encounter: Secondary | ICD-10-CM | POA: Diagnosis not present

## 2016-04-25 DIAGNOSIS — S336XXA Sprain of sacroiliac joint, initial encounter: Secondary | ICD-10-CM | POA: Diagnosis not present

## 2016-04-25 DIAGNOSIS — S161XXA Strain of muscle, fascia and tendon at neck level, initial encounter: Secondary | ICD-10-CM | POA: Diagnosis not present

## 2016-04-25 DIAGNOSIS — M9902 Segmental and somatic dysfunction of thoracic region: Secondary | ICD-10-CM | POA: Diagnosis not present

## 2016-04-25 DIAGNOSIS — M9903 Segmental and somatic dysfunction of lumbar region: Secondary | ICD-10-CM | POA: Diagnosis not present

## 2016-04-26 DIAGNOSIS — S39012A Strain of muscle, fascia and tendon of lower back, initial encounter: Secondary | ICD-10-CM | POA: Diagnosis not present

## 2016-04-26 DIAGNOSIS — S233XXD Sprain of ligaments of thoracic spine, subsequent encounter: Secondary | ICD-10-CM | POA: Diagnosis not present

## 2016-04-26 DIAGNOSIS — S161XXA Strain of muscle, fascia and tendon at neck level, initial encounter: Secondary | ICD-10-CM | POA: Diagnosis not present

## 2016-04-26 DIAGNOSIS — M9902 Segmental and somatic dysfunction of thoracic region: Secondary | ICD-10-CM | POA: Diagnosis not present

## 2016-04-26 DIAGNOSIS — M9903 Segmental and somatic dysfunction of lumbar region: Secondary | ICD-10-CM | POA: Diagnosis not present

## 2016-04-26 DIAGNOSIS — M9901 Segmental and somatic dysfunction of cervical region: Secondary | ICD-10-CM | POA: Diagnosis not present

## 2016-04-26 DIAGNOSIS — S336XXA Sprain of sacroiliac joint, initial encounter: Secondary | ICD-10-CM | POA: Diagnosis not present

## 2016-05-03 DIAGNOSIS — M9903 Segmental and somatic dysfunction of lumbar region: Secondary | ICD-10-CM | POA: Diagnosis not present

## 2016-05-03 DIAGNOSIS — S336XXA Sprain of sacroiliac joint, initial encounter: Secondary | ICD-10-CM | POA: Diagnosis not present

## 2016-05-03 DIAGNOSIS — M9901 Segmental and somatic dysfunction of cervical region: Secondary | ICD-10-CM | POA: Diagnosis not present

## 2016-05-03 DIAGNOSIS — S39012A Strain of muscle, fascia and tendon of lower back, initial encounter: Secondary | ICD-10-CM | POA: Diagnosis not present

## 2016-05-03 DIAGNOSIS — M9902 Segmental and somatic dysfunction of thoracic region: Secondary | ICD-10-CM | POA: Diagnosis not present

## 2016-05-03 DIAGNOSIS — S161XXA Strain of muscle, fascia and tendon at neck level, initial encounter: Secondary | ICD-10-CM | POA: Diagnosis not present

## 2016-05-03 DIAGNOSIS — S233XXD Sprain of ligaments of thoracic spine, subsequent encounter: Secondary | ICD-10-CM | POA: Diagnosis not present

## 2016-05-04 DIAGNOSIS — S233XXD Sprain of ligaments of thoracic spine, subsequent encounter: Secondary | ICD-10-CM | POA: Diagnosis not present

## 2016-05-04 DIAGNOSIS — S161XXA Strain of muscle, fascia and tendon at neck level, initial encounter: Secondary | ICD-10-CM | POA: Diagnosis not present

## 2016-05-04 DIAGNOSIS — M9903 Segmental and somatic dysfunction of lumbar region: Secondary | ICD-10-CM | POA: Diagnosis not present

## 2016-05-04 DIAGNOSIS — M9902 Segmental and somatic dysfunction of thoracic region: Secondary | ICD-10-CM | POA: Diagnosis not present

## 2016-05-04 DIAGNOSIS — M9901 Segmental and somatic dysfunction of cervical region: Secondary | ICD-10-CM | POA: Diagnosis not present

## 2016-05-04 DIAGNOSIS — S336XXA Sprain of sacroiliac joint, initial encounter: Secondary | ICD-10-CM | POA: Diagnosis not present

## 2016-05-04 DIAGNOSIS — S39012A Strain of muscle, fascia and tendon of lower back, initial encounter: Secondary | ICD-10-CM | POA: Diagnosis not present

## 2016-05-19 DIAGNOSIS — J329 Chronic sinusitis, unspecified: Secondary | ICD-10-CM | POA: Diagnosis not present

## 2016-06-07 ENCOUNTER — Other Ambulatory Visit: Payer: Self-pay | Admitting: Gastroenterology

## 2016-06-12 ENCOUNTER — Encounter: Payer: Self-pay | Admitting: Surgery

## 2016-06-13 ENCOUNTER — Telehealth: Payer: Self-pay | Admitting: Gastroenterology

## 2016-06-14 NOTE — Telephone Encounter (Signed)
We have 2 boxes of samples and Magda Paganini will call the rep and get samples to help the pt as much as possible. Left message on machine to call back

## 2016-06-14 NOTE — Telephone Encounter (Signed)
Pt aware and we will get her samples as long as possible.  She will call when she runs out and we will leave some at the front desk for her.

## 2016-06-19 ENCOUNTER — Ambulatory Visit: Payer: PPO | Admitting: Surgery

## 2016-06-19 ENCOUNTER — Ambulatory Visit (INDEPENDENT_AMBULATORY_CARE_PROVIDER_SITE_OTHER): Payer: PPO | Admitting: Physician Assistant

## 2016-06-19 ENCOUNTER — Ambulatory Visit (HOSPITAL_COMMUNITY)
Admission: RE | Admit: 2016-06-19 | Discharge: 2016-06-19 | Disposition: A | Discharge status: Home / Self Care | Payer: PPO | Source: Ambulatory Visit | Attending: Surgery | Admitting: Surgery

## 2016-06-19 VITALS — BP 150/75 | HR 61 | Temp 97.6°F | Resp 16 | Ht 64.0 in | Wt 137.0 lb

## 2016-06-19 DIAGNOSIS — I6523 Occlusion and stenosis of bilateral carotid arteries: Secondary | ICD-10-CM | POA: Insufficient documentation

## 2016-06-19 LAB — VAS US CAROTID
LCCAPSYS: 119 cm/s
LEFT ECA DIAS: -11 cm/s
LEFT VERTEBRAL DIAS: -13 cm/s
LICADDIAS: -29 cm/s
LICAPSYS: -103 cm/s
Left CCA dist dias: -25 cm/s
Left CCA dist sys: -88 cm/s
Left CCA prox dias: 30 cm/s
Left ICA dist sys: -94 cm/s
Left ICA prox dias: -21 cm/s
RIGHT CCA MID DIAS: 16 cm/s
RIGHT ECA DIAS: -6 cm/s
RIGHT VERTEBRAL DIAS: -12 cm/s
Right CCA prox dias: 21 cm/s
Right CCA prox sys: 111 cm/s

## 2016-06-19 NOTE — Progress Notes (Signed)
HISTORY AND PHYSICAL     CC:  Follow up carotid duplex Referring Provider:  Angelina Sheriff, MD  HPI: This is a 76 y.o. female pt of Dr. Trula Slade who returns today for follow up carotid duplex.  She had a duplex in 2012 which revealed moderate bilateral stenosis. This was repeated recently [facility] imaging and she had greater than 70% right carotid stenosis. Peak systolic velocities were to 67 cm/s. Left-sided stenosis was less than 50%. The patient remained asymptomatic.  She continues to be asymptomatic and denies any weakness in any extremity.  She states that she does have occasional numbness in the fingers on her right hand, but this does go away.  She denies any speech difficulties or amaurosis fugax.  She has a hx of CAD with STEMI in 2012 and underwent CABG.  She developed Afib postoperatively, which was treated with amiodarone and coumadin.  She did develop C diff colitis and had a perforated sigmoid colon requiring sigmoid colectomy and end colostomy.  This was reversed in February 2013.  She did go on to be diagnosed with ulcerative colitis.    Also, she has a hx of spasmodic dysphonia and underwent Botox injections at [facility] with improvement.  She states that she needs to return for another injection.  She states that it works well for about 3-6 months.    She states that she is worried about her blood pressure.  Today it was running 179'X-505'W systolic.  She sees Dr. Aundra Dubin.  She states it is time to make an appointment to go see him.   In September, she did a laparoscopic cholecystectomy with IOC by Dr. [name].  She takes an ACEI for blood pressure management.  She is on a daily baby aspirin.  She takes a statin for cholesterol management.   Past Medical History:  Diagnosis Date  . Anemia    "in my early teens"  . Arthritis    neck and back  . Atrial fibrillation ([place])    post-op after CABG;  treated with Amiodarone and coumadin until readmxn with CDiff colitis,  perf colon and elevated LFTs (amio and coumadin stopped)  . C. difficile colitis 01/7947   complicated post CABG course with prolonged admission, perforated sigmoid colon;  s/p sigmoid colectomy with end colostomy [name] procedure);  laparotomy wound infection with Pseudomonas treated with antibxs and secondary intention  . Chronic kidney disease    hx kidney stones  . Complication of anesthesia 12/2010   "had trouble waking me up after colon OR"  . Coronary artery disease    a. NSTEMI 7/12, cath: pLAD 30%, mLAD 60-70%, dLAD 80%, oOM2 50%, mOM2 40%, trifurcation 80%, pRCA 25%, then occluded, L-R collats, EF 55%;   b. 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 Roxy Manns)  . 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 GERD (gastroesophageal reflux disease)   . H/O hiatal hernia    "slight; not having problems with it"  . Hair loss    after heart surgery  . Hemorrhoids    "none since hemorrhoid OR"  . History of bronchitis    late 1980's  . Hyperlipidemia    takes Pravastatin daily  . Hypertension    takes Metoprolol daily  . Mitral valve prolapse    was told this in the late 1990's; ;then with Dr.Mclean he states that she doesn't  . Myocardial infarction 11/2010  . Palpitations 2003   hx of; Holter in 2003 with  PACs and PVCs  . Pericarditis 1980   hx of  . PONV (postoperative nausea and vomiting)    w/appy  . 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 S/P colostomy ([place])    due to perf sigmoid colon in setting of CDiff colitis  . Seizures ([place])    "w/verapimil"  . Surgical wound infection    Pseudomonas; wound VAC; secondary intention    Past Surgical History:  Procedure Laterality Date  . ABDOMINAL HYSTERECTOMY     partial hysterectomy  . APPENDECTOMY     as a child  . BOTOX INJECTION  12/03/2015   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  . CARDIAC CATHETERIZATION  11/21/10  . CATARACT EXTRACTION, BILATERAL    . CHOLECYSTECTOMY N/A  01/25/2016   Procedure: LAPAROSCOPIC CHOLECYSTECTOMY WITH INTRAOPERATIVE CHOLANGIOGRAM;  Surgeon: [name], MD;  Location: [facility];  Service: General;  Laterality: N/A;  . COLONOSCOPY    . COLOSTOMY RECONNECT    . colostomy reversal  06/27/11  . CORONARY ARTERY BYPASS GRAFT  11-25-2010   CABG X3; LIMA to LAD, SVG to PDA, SVG to OM, EVH via right thigh  . ESOPHAGOGASTRODUODENOSCOPY    . FLEXIBLE SIGMOIDOSCOPY  11/09/2011   Procedure: FLEXIBLE SIGMOIDOSCOPY;  Surgeon: Milus Banister, MD;  Location: [facility];  Service: Endoscopy;  Laterality: N/A;  . HEMORRHOID SURGERY  ~ 2005   x 2  . OOPHORECTOMY  2005   left; w/"mass removal"  . PROCTOSCOPY  06/27/2011   Procedure: PROCTOSCOPY;  Surgeon: Gayland Curry, MD;  Location: [facility];  Service: General;  Laterality: N/A;  . SIGMOIDECTOMY  12/18/2010   with end colostomy  . TUBAL LIGATION      Allergies  Allergen Reactions  . Nadolol Hives and Anaphylaxis    Rash and nausea  . Other Other (See Comments) and Anaphylaxis    apples Apples-anaphylaxis  . Sulfa Antibiotics Hives  . Sulfamethoxazole Anaphylaxis  . Sulfonamide Derivatives Anaphylaxis  . Verapamil Other (See Comments) and Anaphylaxis    seizures    Current Outpatient Prescriptions  Medication Sig Dispense Refill  . acetaminophen (TYLENOL) 500 MG tablet Take 1,000 mg by mouth every 6 (six) hours as needed for mild pain.    . APRISO 0.375 g 24 hr capsule TAKE TWO CAPSULES BY MOUTH TWICE DAILY 120 capsule 1  . Ascorbic Acid (VITAMIN C) 1000 MG tablet Take 1,000 mg by mouth daily.    Marland Kitchen aspirin 81 MG tablet Take 81 mg by mouth daily.    . Bacillus Coagulans-Inulin (PROBIOTIC-PREBIOTIC) 1-250 BILLION-MG CAPS Take 1 capsule by mouth daily.     . Calcium Carb-Cholecalciferol (SM CALCIUM/VITAMIN D) 600-800 MG-UNIT TABS Take 1 tablet by mouth daily.    . Coenzyme Q10 200 MG TABS Take 1 tablet by mouth daily.   0  . magnesium oxide (MAG-OX) 400 MG tablet Take 1 tablet (400 mg total) by  mouth daily. 30 tablet 6  . Multiple Vitamin (MULTIVITAMIN) tablet Take 1 tablet by mouth daily.     . Omega-3 350 MG CPDR Take 350 mg by mouth daily.    Marland Kitchen oxyCODONE (OXY IR/ROXICODONE) 5 MG immediate release tablet Take 1-2 tablets (5-10 mg total) by mouth every 4 (four) hours as needed for moderate pain, severe pain or breakthrough pain. 25 tablet 0  . pravastatin (PRAVACHOL) 80 MG tablet TAKE ONE TABLET BY MOUTH ONCE DAILY IN THE EVENING 90 tablet 3  . ramipril (ALTACE) 2.5 MG capsule  Take 2 capsules (5 mg total) by mouth daily. 180 capsule 3   No current facility-administered medications for this visit.     Family History  Problem Relation Age of Onset  . Heart attack Father 39    died  . Heart disease Father     massive heart attack  . Coronary artery disease Mother 40    had bypass in [place]  . Heart disease Mother   . Anesthesia problems Neg Hx   . Hypotension Neg Hx   . Malignant hyperthermia Neg Hx   . Pseudochol deficiency Neg Hx   . Colon cancer Neg Hx   . Esophageal cancer Neg Hx   . Stomach cancer Neg Hx   . Rectal cancer Neg Hx     Social History   Social History  . Marital status: Married    Spouse name: N/A  . Number of children: 1  . Years of education: N/A   Occupational History  . LPN [employer]   Social History Main Topics  . Smoking status: Never Smoker  . Smokeless tobacco: Never Used  . Alcohol use No  . Drug use: No  . Sexual activity: Yes    Birth control/ protection: Post-menopausal   Other Topics Concern  . Not on file   Social History Narrative  . No narrative on file     REVIEW OF SYSTEMS:   [X]  denotes positive finding, [ ]  denotes negative finding Cardiac  Comments:  Chest pain or chest pressure:    Shortness of breath upon exertion:    Short of breath when lying flat:    Irregular heart rhythm: x       Vascular    Pain in calf, thigh, or hip brought on by ambulation:    Pain in feet at night that wakes you up  from your sleep:     Blood clot in your veins:    Leg swelling:         Pulmonary    Oxygen at home:    Productive cough:     Wheezing:     Sinusitis/bronchitis x   Neurologic    Sudden weakness in arms or legs:     Sudden numbness in arms or legs:     Sudden onset of difficulty speaking or slurred speech:    Temporary loss of vision in one eye:     Problems with dizziness:         Gastrointestinal    Blood in stool:     Vomited blood:         Genitourinary    Burning when urinating:     Blood in urine:    Ulcerative Colitis X  Under good control  Psychiatric    Major depression:         Hematologic    Bleeding problems:    Problems with blood clotting too easily:        Skin    Rashes or ulcers:        Constitutional    Fever or chills:      PHYSICAL EXAMINATION:  Vitals: Vitals:   06/19/16 1417 06/19/16 1419  BP: (!) 143/74 (!) 150/75  Pulse: 61   Resp: 16   Temp: 97.6 F (36.4 C)    Vitals:   06/19/16 1417  Weight: 137 lb (62.1 kg)  Height: 5' 4"  (1.626 m)     General:  WDWN in NAD; vital signs documented above Gait: Not observed HENT:  WNL, normocephalic Pulmonary: normal non-labored breathing , without Rales, rhonchi,  wheezing Cardiac: regular HR, without  Murmurs, rubs or gallops; without carotid bruit  Abdomen: soft, NT, no masses Skin: without rashes Vascular Exam/Pulses:  Right Left  Radial 2+ (normal) 2+ (normal)  DP 2+ (normal) 2+ (normal)  PT 2+ (normal) 2+ (normal)   Extremities: without ischemic changes, without Gangrene , without cellulitis; without open wounds;  Musculoskeletal: no muscle wasting or atrophy  Neurologic: A&O X 3;  No focal weakness or paresthesias are detected Psychiatric:  The pt has Normal affect.   Non-Invasive Vascular Imaging:   Carotid Duplex on 06/19/16: Increased velocities on the right to 60-79% Less than 40% stenosis on the left Vertebral flow is antegrade bilaterally  Previous Carotid Duplex  12/06/15: Doppler velocities suggest high end 40-59% right proximal ICA stenosis with no left ICA stenosis.   Pt meds includes: Statin:  Yes.   Beta Blocker:  No. Aspirin:  Yes.   ACEI:  Yes.   ARB:  No. CCB use:  No Other Antiplatelet/Anticoagulant:  No   ASSESSMENT/PLAN:: 76 y.o. female with asymptomatic bilateral carotid artery stenosis   -pt's velocities have increased and peak systolic velocity is 163 cm/s (267 cm/s last visit).  She remains asymptomatic.  I discussed this with Dr. Trula Slade and we will have her return in 6 months with a carotid duplex.  She will call sooner if she has any issues.  She knows to go to the ER if she develops any signs of stroke. -she will be seeing Dr. Joya Gaskins at [facility] in the near future for treatment of her spasmodic dysphonia. -she is worried about her blood pressure.  She is going to make an appointment to see Dr. Aundra Dubin.  I have advised her to keep a log of her blood pressure for morning and night and take to Dr. Aundra Dubin to titrate her BP medications.    Leontine Locket, PA-C [facility] [phone]  Clinic MD:  Trula Slade

## 2016-06-21 NOTE — Addendum Note (Signed)
Addended by: Lianne Cure A on: 06/21/2016 12:06 PM   Modules accepted: Orders

## 2016-06-23 ENCOUNTER — Telehealth (HOSPITAL_COMMUNITY): Payer: Self-pay | Admitting: *Deleted

## 2016-06-23 ENCOUNTER — Telehealth: Payer: Self-pay | Admitting: Cardiology

## 2016-06-23 DIAGNOSIS — I10 Essential (primary) hypertension: Secondary | ICD-10-CM

## 2016-06-23 MED ORDER — RAMIPRIL 10 MG PO CAPS: 10.0000 mg | ORAL_CAPSULE | Freq: Every day | ORAL | 0 refills | Status: DC

## 2016-06-23 NOTE — Telephone Encounter (Signed)
Discussed with pt, pt agreed to increase ramipril to 60m daily, has been scheduled to see SRichardson Dopp PA on 07/05/16 at 10:45 AM, pt aware she will need BMET at that time, will bring log of BP readings to appt 07/05/16.

## 2016-06-23 NOTE — Telephone Encounter (Signed)
Pt left a VM on our triage line earlier today stating her BP has been running high and she would like to know what to do with her meds.  As pt is not a HF Clinic pt message sent to [facility].

## 2016-06-23 NOTE — Telephone Encounter (Signed)
A bit high.  Increase ramipril to 10 mg daily with BMET in 10 days.

## 2016-06-23 NOTE — Telephone Encounter (Signed)
LMTCB

## 2016-06-23 NOTE — Telephone Encounter (Signed)
New message     Returning [name] call about her bp , use her cell not her husbands please ,thank you

## 2016-06-23 NOTE — Telephone Encounter (Signed)
Left message for patient to call back  

## 2016-06-23 NOTE — Telephone Encounter (Signed)
See previous phone note which addresses this call.

## 2016-06-23 NOTE — Telephone Encounter (Signed)
I spoke with pt. She reports she saw PA at [facility] earlier this week and BP was elevated. Told to check twice daily. Prior to this pt reports she did not check BP on a regular basis. She reports readings have been 140-142/68-72. One reading of 162/70 last night.  Pt is concerned about BP and is wondering if medications need to be adjusted.  Last seen here in 2/17 by Dr. Aundra Dubin.  Will forward to Dr. Aundra Dubin for review/recommendations.

## 2016-07-05 ENCOUNTER — Other Ambulatory Visit: Payer: PPO | Admitting: *Deleted

## 2016-07-05 ENCOUNTER — Ambulatory Visit: Payer: PPO | Admitting: Physician Assistant

## 2016-07-05 ENCOUNTER — Encounter: Payer: Self-pay | Admitting: Physician Assistant

## 2016-07-05 ENCOUNTER — Telehealth: Payer: Self-pay | Admitting: Gastroenterology

## 2016-07-05 ENCOUNTER — Ambulatory Visit (INDEPENDENT_AMBULATORY_CARE_PROVIDER_SITE_OTHER): Payer: PPO | Admitting: Physician Assistant

## 2016-07-05 ENCOUNTER — Other Ambulatory Visit: Payer: PPO

## 2016-07-05 VITALS — BP 120/50 | HR 71 | Ht 66.0 in | Wt 137.1 lb

## 2016-07-05 DIAGNOSIS — E784 Other hyperlipidemia: Secondary | ICD-10-CM | POA: Diagnosis not present

## 2016-07-05 DIAGNOSIS — J383 Other diseases of vocal cords: Secondary | ICD-10-CM | POA: Diagnosis not present

## 2016-07-05 DIAGNOSIS — I2510 Atherosclerotic heart disease of native coronary artery without angina pectoris: Secondary | ICD-10-CM | POA: Diagnosis not present

## 2016-07-05 DIAGNOSIS — I10 Essential (primary) hypertension: Secondary | ICD-10-CM

## 2016-07-05 DIAGNOSIS — R002 Palpitations: Secondary | ICD-10-CM | POA: Diagnosis not present

## 2016-07-05 DIAGNOSIS — I739 Peripheral vascular disease, unspecified: Secondary | ICD-10-CM

## 2016-07-05 DIAGNOSIS — I779 Disorder of arteries and arterioles, unspecified: Secondary | ICD-10-CM | POA: Insufficient documentation

## 2016-07-05 DIAGNOSIS — E7849 Other hyperlipidemia: Secondary | ICD-10-CM

## 2016-07-05 NOTE — Patient Instructions (Addendum)
Medication Instructions:  Your physician recommends that you continue on your current medications as directed. Please refer to the Current Medication list given to you today.  Labwork: YOU WILL NEED FASTING LIPID AND CMET ; YOU CAN GET THIS DONE THE SAME DAY YOU COME IN FOR YOUR STRESS TEST  Testing/Procedures: Your physician has requested that you have an exercise tolerance test. For further information please visit [email or web address]. Please also follow instruction sheet, as given.  Follow-Up: Your physician wants you to follow-up in: 6 MONTHS WITH DR. END You will receive a reminder letter in the mail two months in advance. If you don't receive a letter, please call our office to schedule the follow-up appointment.  Any Other Special Instructions Will Be Listed Below (If Applicable).  If you need a refill on your cardiac medications before your next appointment, please call your pharmacy.

## 2016-07-05 NOTE — Progress Notes (Signed)
I may have told her that I'd [facility].  If that's her understanding, make her followup with me.  If not, can see Dr. Saunders Revel.

## 2016-07-05 NOTE — Addendum Note (Signed)
Addended by: Eulis Foster on: 07/05/2016 02:31 PM   Modules accepted: Orders

## 2016-07-05 NOTE — Telephone Encounter (Signed)
The pt came in today and picked up apriso samples.

## 2016-07-05 NOTE — Progress Notes (Signed)
Cardiology Office Note:    Date:  07/05/2016   ID:  [name], [name] [birth year], MRN [id number]  PCP:  Angelina Sheriff., MD  Cardiologist:  Dr. Loralie Champagne >> Dr. Harrell Gave End   Electrophysiologist:  n/a GI: Dr. Ardis Hughs ENT: Dr. Addison Bailey [facility]) [facility]: Dr. Trula Slade  Referring MD: Angelina Sheriff, MD   Chief Complaint  Patient presents with  . Follow-up    High blood pressure    History of Present Illness:    [name] is a 76 y.o. female with a hx of CAD status post non-STEMI in 7/12 by CABG, postoperative atrial fibrillation. Her CABG was complicated by C. difficile colitis. She ultimately developed perforated sigmoid colon requiring sigmoid colectomy.  She was ultimately diagnosed with ulcerative colitis 2014. She has been diagnosed with spasmodic dysphonia. CPX in 1/17 did not indicate significant cardiac or pulmonary abnormality. She was last seen by Dr. Aundra Dubin in 2/17. At that time, it was felt that treatment of her spasmodic dysphonia may improve her dyspnea. It was felt that if her symptoms do not improve with treatment, the next step would be proceeding to right and left heart catheterization.  She underwent laryngeal Botox injection in 7/17.  She was recently seen by [facility] and noted to have an elevated blood pressure. Her ACE inhibitor dose was adjusted and she was placed on my schedule today for follow-up.  She is here alone today. She has overall been doing well. Her dyspnea did improve significantly with the laryngeal Botox injection. She exercises quite often. She denies chest pain.  She denies orthopnea, PND or edema. She denies syncope. She does note palpitations that occur with exercise. This has been ongoing for the past couple of months. She brings in a list of her blood pressure readings. They have improved since increasing ramipril to 10 mg daily.  Prior CV studies:   The following studies were reviewed today:  Carotid US  2/18 - VVS RCA 81-44; LICA <81  CPX (8/56)  with submaximal effort, peak VO2 20.4 (normal), VE/VCO2 24 (normal) => no pulmonary or cardiac limitation, hypertensive response, frequent PVCs.  PFTs (3/15):  Normal  Event monitor 3/13 NSR  Echo 8/12 Mild focal basal septal hypertrophy, EF 55-60, normal wall motion   Past Medical History:  Diagnosis Date  . Anemia    "in my early teens"  . Arthritis    neck and back  . Atrial fibrillation ([place])    post-op after CABG;  treated with Amiodarone and coumadin until readmxn with CDiff colitis, perf colon and elevated LFTs (amio and coumadin stopped)  . C. difficile colitis 07/1495   complicated post CABG course with prolonged admission, perforated sigmoid colon;  s/p sigmoid colectomy with end colostomy [name] procedure);  laparotomy wound infection with Pseudomonas treated with antibxs and secondary intention  . Chronic kidney disease    hx kidney stones  . Complication of anesthesia 12/2010   "had trouble waking me up after colon OR"  . Coronary artery disease    a. NSTEMI 7/12, cath: pLAD 30%, mLAD 60-70%, dLAD 80%, oOM2 50%, mOM2 40%, trifurcation 80%, pRCA 25%, then occluded, L-R collats, EF 55%;   b. 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 Roxy Manns)  . 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 GERD (gastroesophageal reflux disease)   . H/O hiatal hernia    "slight; not having problems with it"  . Hair loss  after heart surgery  . Hemorrhoids    "none since hemorrhoid OR"  . History of bronchitis    late 1980's  . Hyperlipidemia    takes Pravastatin daily  . Hypertension    takes Metoprolol daily  . Mitral valve prolapse    was told this in the late 1990's; ;then with Dr.Mclean he states that she doesn't  . Myocardial infarction 11/2010  . Palpitations 2003   hx of; Holter in 2003 with PACs and PVCs  . Pericarditis 1980   hx of  . PONV (postoperative nausea and vomiting)    w/appy  . 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 S/P colostomy ([place])    due to perf sigmoid colon in setting of CDiff colitis  . Seizures ([place])    "w/verapimil"  . Surgical wound infection    Pseudomonas; wound VAC; secondary intention    Past Surgical History:  Procedure Laterality Date  . ABDOMINAL HYSTERECTOMY     partial hysterectomy  . APPENDECTOMY     as a child  . BOTOX INJECTION  12/03/2015   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  . CARDIAC CATHETERIZATION  11/21/10  . CATARACT EXTRACTION, BILATERAL    . CHOLECYSTECTOMY N/A 01/25/2016   Procedure: LAPAROSCOPIC CHOLECYSTECTOMY WITH INTRAOPERATIVE CHOLANGIOGRAM;  Surgeon: [name], MD;  Location: [facility];  Service: General;  Laterality: N/A;  . COLONOSCOPY    . COLOSTOMY RECONNECT    . colostomy reversal  06/27/11  . CORONARY ARTERY BYPASS GRAFT  11-25-2010   CABG X3; LIMA to LAD, SVG to PDA, SVG to OM, EVH via right thigh  . ESOPHAGOGASTRODUODENOSCOPY    . FLEXIBLE SIGMOIDOSCOPY  11/09/2011   Procedure: FLEXIBLE SIGMOIDOSCOPY;  Surgeon: Milus Banister, MD;  Location: [facility];  Service: Endoscopy;  Laterality: N/A;  . HEMORRHOID SURGERY  ~ 2005   x 2  . OOPHORECTOMY  2005   left; w/"mass removal"  . PROCTOSCOPY  06/27/2011   Procedure: PROCTOSCOPY;  Surgeon: Gayland Curry, MD;  Location: [place];  Service: General;  Laterality: N/A;  . SIGMOIDECTOMY  12/18/2010   with end colostomy  . TUBAL LIGATION      Current Medications: Current Meds  Medication Sig  . acetaminophen (TYLENOL) 500 MG tablet Take 1,000 mg by mouth every 6 (six) hours as needed for mild pain.  . APRISO 0.375 g 24 hr capsule TAKE TWO CAPSULES BY MOUTH TWICE DAILY  . Ascorbic Acid (VITAMIN C) 1000 MG tablet Take 1,000 mg by mouth daily.  Marland Kitchen aspirin 81 MG tablet Take 81 mg by mouth daily.  . Bacillus Coagulans-Inulin (PROBIOTIC-PREBIOTIC) 1-250 BILLION-MG CAPS Take 1 capsule by mouth daily.   . Calcium Carb-Cholecalciferol (SM CALCIUM/VITAMIN D)  600-800 MG-UNIT TABS Take 1 tablet by mouth daily.  . Coenzyme Q10 200 MG TABS Take 1 tablet by mouth daily.   . magnesium oxide (MAG-OX) 400 MG tablet Take 1 tablet (400 mg total) by mouth daily.  . Multiple Vitamin (MULTIVITAMIN) tablet Take 1 tablet by mouth daily.   . Omega-3 350 MG CPDR Take 350 mg by mouth daily.  . pravastatin (PRAVACHOL) 80 MG tablet TAKE ONE TABLET BY MOUTH ONCE DAILY IN THE EVENING  . ramipril (ALTACE) 10 MG capsule Take 1 capsule (10 mg total) by mouth daily.     Allergies:   Nadolol; Other; Sulfa antibiotics; Sulfamethoxazole; Sulfonamide derivatives; and Verapamil   Social History   Social History  . Marital status: Married  Spouse name: N/A  . Number of children: 1  . Years of education: N/A   Occupational History  . 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 Smoking status: Never Smoker  . Smokeless tobacco: Never Used  . Alcohol use No  . Drug use: No  . Sexual activity: Yes    Birth control/ protection: Post-menopausal   Other Topics Concern  . None   Social History Narrative  . None     Family History  Problem Relation Age of Onset  . Heart attack Father 41    died  . Heart disease Father     massive heart attack  . Coronary artery disease Mother 90    had bypass [place]  . Heart disease Mother   . Anesthesia problems Neg Hx   . Hypotension Neg Hx   . Malignant hyperthermia Neg Hx   . Pseudochol deficiency Neg Hx   . Colon cancer Neg Hx   . Esophageal cancer Neg Hx   . Stomach cancer Neg Hx   . Rectal cancer Neg Hx      ROS:   Please see the history of present illness.    Review of Systems  Cardiovascular: Positive for dyspnea on exertion.   All other systems reviewed and are negative.   EKGs/Labs/Other Test Reviewed:    EKG:  EKG is  ordered today.  The ekg ordered today demonstrates NSR, HR 71, normal axis, rightward axis, nonspecific ST-T wave changes, QTc 402 ms  Recent Labs: 01/24/2016: ALT 22;  BUN 19; Creatinine, Ser 0.98; Hemoglobin 13.0; Platelets 112; Potassium 4.6; Sodium 140   Recent Lipid Panel    Component Value Date/Time   CHOL 174 04/13/2014 0934   TRIG 123.0 04/13/2014 0934   HDL 51.20 04/13/2014 0934   CHOLHDL 3 04/13/2014 0934   VLDL 24.6 04/13/2014 0934   LDLCALC 98 04/13/2014 0934   LDLDIRECT 81.8 07/20/2011 1504     Physical Exam:    VS:  BP (!) 120/50   Pulse 71   Ht 5' 6"  (1.676 m)   Wt 137 lb 1.9 oz (62.2 kg)   BMI 22.13 kg/m     Wt Readings from Last 3 Encounters:  07/05/16 137 lb 1.9 oz (62.2 kg)  06/19/16 137 lb (62.1 kg)  01/25/16 203 lb 12 oz (92.4 kg)     Physical Exam  Constitutional: She is oriented to person, place, and time. She appears well-developed and well-nourished. No distress.  HENT:  Head: Normocephalic and atraumatic.  Eyes: No scleral icterus.  Neck: Normal range of motion. No JVD present.  Cardiovascular: Normal rate, regular rhythm, S1 normal, S2 normal and normal heart sounds.   No murmur heard. Pulmonary/Chest: Breath sounds normal. She has no wheezes. She has no rhonchi. She has no rales.  Abdominal: Soft. There is no tenderness.  Musculoskeletal: She exhibits no edema.  Neurological: She is alert and oriented to person, place, and time.  Skin: Skin is warm and dry.  Psychiatric: She has a normal mood and affect.    ASSESSMENT:    1. Essential hypertension   2. Palpitations   3. Coronary artery disease involving native coronary artery of native heart without angina pectoris   4. Other hyperlipidemia   5. Bilateral carotid artery disease (HCC)   6. Spasmodic dysphonia    PLAN:    In order of problems listed above:  1. HTN - Blood pressure is better controlled. Continue current dose of ramipril.  The evening blood pressures do tend to run higher. I have asked her to continue to watch her blood pressures at home and notify me if they remain above target. Repeat BMET was already drawn today.  2. Palpitations  - She notes palpitations with exercise. Etiology not entirely clear. I will arrange a plain exercise treadmill test.  3. CAD - status post CABG in 2012.  She denies chest pain. However, she has had palpitations with exercise. Her breathing did improve with laryngeal Botox injections. It has been 6 years since her bypass surgery. I will arrange a plain exercise treadmill test. Continue aspirin, statin.     4. HL -  Continue statin. Arrange follow-up CMET, lipids.   5. Carotid artery disease - Followed by [facility].    6. Spasmodic dysphonia  - She is followed by ENT at [facility] She has had significant improvement with the laryngeal Botox injections.    Medication Adjustments/Labs and Tests Ordered: Current medicines are reviewed at length with the patient today.  Concerns regarding medicines are outlined above.  Medication changes, Labs and Tests ordered today are outlined in the Patient Instructions noted below. Patient Instructions  Medication Instructions:  Your physician recommends that you continue on your current medications as directed. Please refer to the Current Medication list given to you today.  Labwork: YOU WILL NEED FASTING LIPID AND CMET ; YOU CAN GET THIS DONE THE SAME DAY YOU COME IN FOR YOUR STRESS TEST  Testing/Procedures: Your physician has requested that you have an exercise tolerance test. For further information please visit [email or web address]. Please also follow instruction sheet, as given.  Follow-Up: Your physician wants you to follow-up in: 6 MONTHS WITH DR. END You will receive a reminder letter in the mail two months in advance. If you don't receive a letter, please call our office to schedule the follow-up appointment.  Any Other Special Instructions Will Be Listed Below (If Applicable).  If you need a refill on your cardiac medications before your next appointment, please call your pharmacy.   Return in about 6 months (around 01/02/2017) for Routine Follow  Up, w/ Dr. Saunders Revel.   Signed, Richardson Dopp, PA-C  07/05/2016 3:40 PM    [facility] [address], [place], [place]  [address] Phone: [phone]; Fax: [phone]

## 2016-07-06 LAB — BASIC METABOLIC PANEL
BUN / CREAT RATIO: 24 (ref 12–28)
BUN: 19 mg/dL (ref 8–27)
CALCIUM: 9.2 mg/dL (ref 8.7–10.3)
CO2: 26 mmol/L (ref 18–29)
CREATININE: 0.79 mg/dL (ref 0.57–1.00)
Chloride: 100 mmol/L (ref 96–106)
GFR calc non Af Amer: 74 (ref >59)
GFR, EST AFRICAN AMERICAN: 85 (ref >59)
Glucose: 90 mg/dL (ref 65–99)
Potassium: 3.8 mmol/L (ref 3.5–5.2)
Sodium: 143 mmol/L (ref 134–144)

## 2016-07-10 ENCOUNTER — Telehealth: Payer: Self-pay | Admitting: Gastroenterology

## 2016-07-12 NOTE — Addendum Note (Signed)
Addended by: Eulis Foster on: 07/12/2016 04:55 PM   Modules accepted: Orders

## 2016-07-12 NOTE — Telephone Encounter (Signed)
Patient calling in regarding this. She is requesting samples of apriso. Patient states that she will be out of town from 03/04-3/13. She will not have enough to last her through vacation. Best # [phone]

## 2016-07-12 NOTE — Telephone Encounter (Signed)
I have 4 sample bottles and the rep will be bringing more on Friday.  The pt is aware and will come pick up samples Friday afternoon

## 2016-07-24 ENCOUNTER — Other Ambulatory Visit (HOSPITAL_COMMUNITY): Payer: Self-pay | Admitting: Cardiology

## 2016-07-24 DIAGNOSIS — I10 Essential (primary) hypertension: Secondary | ICD-10-CM

## 2016-08-04 ENCOUNTER — Other Ambulatory Visit: Payer: PPO | Admitting: *Deleted

## 2016-08-04 ENCOUNTER — Ambulatory Visit (INDEPENDENT_AMBULATORY_CARE_PROVIDER_SITE_OTHER): Payer: PPO

## 2016-08-04 DIAGNOSIS — I2510 Atherosclerotic heart disease of native coronary artery without angina pectoris: Secondary | ICD-10-CM | POA: Diagnosis not present

## 2016-08-04 DIAGNOSIS — R002 Palpitations: Secondary | ICD-10-CM

## 2016-08-04 DIAGNOSIS — I10 Essential (primary) hypertension: Secondary | ICD-10-CM | POA: Diagnosis not present

## 2016-08-04 DIAGNOSIS — E784 Other hyperlipidemia: Secondary | ICD-10-CM | POA: Diagnosis not present

## 2016-08-04 DIAGNOSIS — E7849 Other hyperlipidemia: Secondary | ICD-10-CM

## 2016-08-04 LAB — EXERCISE TOLERANCE TEST
CHL CUP RESTING HR STRESS: 67 {beats}/min
Estimated workload: 6.4 METS
Exercise duration (min): 4 min
Exercise duration (sec): 34 s
MPHR: 146 {beats}/min
Peak HR: 137 {beats}/min
Percent HR: 93 %
RPE: 15

## 2016-08-04 LAB — COMPREHENSIVE METABOLIC PANEL
A/G RATIO: 1.6 (ref 1.2–2.2)
ALBUMIN: 4.4 g/dL (ref 3.5–4.8)
ALT: 15 IU/L (ref 0–32)
AST: 36 IU/L (ref 0–40)
Alkaline Phosphatase: 66 IU/L (ref 39–117)
BUN / CREAT RATIO: 21 (ref 12–28)
BUN: 18 mg/dL (ref 8–27)
Bilirubin Total: 0.5 mg/dL (ref 0.0–1.2)
CALCIUM: 9.5 mg/dL (ref 8.7–10.3)
CO2: 27 mmol/L (ref 18–29)
CREATININE: 0.84 mg/dL (ref 0.57–1.00)
Chloride: 100 mmol/L (ref 96–106)
GFR calc Af Amer: 79 mL/min/{1.73_m2} (ref >59)
GFR, EST NON AFRICAN AMERICAN: 69 mL/min/{1.73_m2} (ref >59)
GLOBULIN, TOTAL: 2.8 g/dL (ref 1.5–4.5)
Glucose: 104 mg/dL — ABNORMAL HIGH (ref 65–99)
POTASSIUM: 4.1 mmol/L (ref 3.5–5.2)
SODIUM: 140 mmol/L (ref 134–144)
Total Protein: 7.2 g/dL (ref 6.0–8.5)

## 2016-08-04 LAB — LIPID PANEL
CHOLESTEROL TOTAL: 169 mg/dL (ref 100–199)
Chol/HDL Ratio: 2.6 ratio units (ref 0.0–4.4)
HDL: 64 mg/dL (ref >39)
LDL CALC: 86 mg/dL (ref 0–99)
TRIGLYCERIDES: 93 mg/dL (ref 0–149)
VLDL CHOLESTEROL CAL: 19 mg/dL (ref 5–40)

## 2016-08-04 NOTE — Progress Notes (Signed)
22

## 2016-08-06 ENCOUNTER — Encounter: Payer: Self-pay | Admitting: Physician Assistant

## 2016-08-07 ENCOUNTER — Telehealth: Payer: Self-pay | Admitting: *Deleted

## 2016-08-07 DIAGNOSIS — R9439 Abnormal result of other cardiovascular function study: Secondary | ICD-10-CM

## 2016-08-07 NOTE — Telephone Encounter (Signed)
Pt notified of both GXT and lab results and findings by phone with verbal understanding to all results given today. Pt is agreeable to [facility] as well as to check BP daily and call with readings in 2 weeks. I will fax a copy of test results to Dr. Lovette Cliche II.  I will have [facility] call pt with date and time for Lexiscan. Pt thanked me for my call.

## 2016-08-14 ENCOUNTER — Telehealth: Payer: Self-pay | Admitting: Gastroenterology

## 2016-08-15 ENCOUNTER — Telehealth (HOSPITAL_COMMUNITY): Payer: Self-pay | Admitting: *Deleted

## 2016-08-15 NOTE — Telephone Encounter (Signed)
Pt aware that samples have been left at the front desk for pick up

## 2016-08-15 NOTE — Telephone Encounter (Signed)
Patient given detailed instructions per Myocardial Perfusion Study Information Sheet for the test on 08/18/16 Patient notified to arrive 15 minutes early and that it is imperative to arrive on time for appointment to keep from having the test rescheduled.  If you need to cancel or reschedule your appointment, please call the office within 24 hours of your appointment. Failure to do so may result in a cancellation of your appointment, and a $50 no show fee. Patient verbalized understanding. [name]

## 2016-08-17 DIAGNOSIS — H6692 Otitis media, unspecified, left ear: Secondary | ICD-10-CM | POA: Diagnosis not present

## 2016-08-17 DIAGNOSIS — J329 Chronic sinusitis, unspecified: Secondary | ICD-10-CM | POA: Diagnosis not present

## 2016-08-17 DIAGNOSIS — Z6822 Body mass index (BMI) 22.0-22.9, adult: Secondary | ICD-10-CM | POA: Diagnosis not present

## 2016-08-18 ENCOUNTER — Encounter (HOSPITAL_COMMUNITY): Payer: PPO

## 2016-08-22 ENCOUNTER — Telehealth: Payer: Self-pay | Admitting: Gastroenterology

## 2016-08-22 NOTE — Telephone Encounter (Signed)
Samples have been left at the front desk for the pt to pick up.  Pt has been notified

## 2016-08-24 ENCOUNTER — Telehealth (HOSPITAL_COMMUNITY): Payer: Self-pay | Admitting: *Deleted

## 2016-08-24 NOTE — Telephone Encounter (Signed)
Left message on voicemail in reference to upcoming appointment scheduled for 08/29/16 Phone number given for a call back so details instructions can be given.  [name]

## 2016-08-25 ENCOUNTER — Telehealth (HOSPITAL_COMMUNITY): Payer: Self-pay | Admitting: Radiology

## 2016-08-25 NOTE — Telephone Encounter (Signed)
Patient given detailed instructions per Myocardial Perfusion Study Information Sheet for the test on 08/29/2016 at 10:00. Patient notified to arrive 15 minutes early and that it is imperative to arrive on time for appointment to keep from having the test rescheduled.  If you need to cancel or reschedule your appointment, please call the office within 24 hours of your appointment. Failure to do so may result in a cancellation of your appointment, and a $50 no show fee. Patient verbalized understanding.EHK

## 2016-08-29 ENCOUNTER — Ambulatory Visit (HOSPITAL_COMMUNITY): Payer: PPO | Attending: Cardiovascular Disease

## 2016-08-29 ENCOUNTER — Encounter: Payer: Self-pay | Admitting: Physician Assistant

## 2016-08-29 DIAGNOSIS — R9439 Abnormal result of other cardiovascular function study: Secondary | ICD-10-CM

## 2016-08-29 DIAGNOSIS — E785 Hyperlipidemia, unspecified: Secondary | ICD-10-CM | POA: Insufficient documentation

## 2016-08-29 DIAGNOSIS — I10 Essential (primary) hypertension: Secondary | ICD-10-CM | POA: Insufficient documentation

## 2016-08-29 DIAGNOSIS — R9431 Abnormal electrocardiogram [ECG] [EKG]: Secondary | ICD-10-CM | POA: Insufficient documentation

## 2016-08-29 DIAGNOSIS — I2510 Atherosclerotic heart disease of native coronary artery without angina pectoris: Secondary | ICD-10-CM | POA: Diagnosis not present

## 2016-08-29 LAB — MYOCARDIAL PERFUSION IMAGING
CHL CUP NUCLEAR SRS: 2
CHL CUP NUCLEAR SSS: 3
LHR: 0.3
LV dias vol: 77 mL (ref 46–106)
LV sys vol: 24 mL
NUC STRESS TID: 0.86
Peak HR: 101 {beats}/min
Rest HR: 73 {beats}/min
SDS: 1

## 2016-08-29 MED ORDER — TECHNETIUM TC 99M TETROFOSMIN IV KIT
32.7000 | PACK | Freq: Once | INTRAVENOUS | Status: AC | PRN
  Administered 2016-08-29: 32.7 via INTRAVENOUS
  Filled 2016-08-29: qty 33

## 2016-08-29 MED ORDER — REGADENOSON 0.4 MG/5ML IV SOLN
0.4000 mg | Freq: Once | INTRAVENOUS | Status: AC
Start: 2016-08-29 — End: 2016-08-29
  Administered 2016-08-29: 0.4 mg via INTRAVENOUS

## 2016-08-29 MED ORDER — TECHNETIUM TC 99M TETROFOSMIN IV KIT
10.8000 | PACK | Freq: Once | INTRAVENOUS | Status: AC | PRN
  Administered 2016-08-29: 10.8 via INTRAVENOUS
  Filled 2016-08-29: qty 11

## 2016-08-30 ENCOUNTER — Telehealth: Payer: Self-pay | Admitting: *Deleted

## 2016-08-30 NOTE — Progress Notes (Signed)
Reviewed BP readings from home provided by Royetta Asal. BP ranges 104/64 to 166/69. Avg BP is optimal. Continue with current medications.  Richardson Dopp, PA-C   08/30/2016 11:12 AM

## 2016-08-30 NOTE — Telephone Encounter (Signed)
-----   Message from Liliane Shi, [place] sent at 08/29/2016  2:41 PM EDT ----- Please call the patient. The stress test is normal. Please ask if patient monitored BP. BP readings during stress test were too high. We probably need to add a new medication to control BP (unless BP is normal at home). Please fax a copy of this study result to her PCP:  [facility] Angelique Blonder., MD  Thanks! Richardson Dopp, PA-C    08/29/2016 2:33 PM

## 2016-08-30 NOTE — Telephone Encounter (Signed)
Pt notified of normal Myoview results as well as BP was high during stress test. Pt and I had spoke 08/07/16 and I had asked pt to monitor BP x 2 weeks and call or send readings to Richardson Dopp, [place]. I asked pt if she had any BP readings from our conversation 08/07/16. Pt states she mailed BP readings to [facility] last week. I thanked pt for sending readings. I will check with the secretary to see if the readings came in yet. I did advise pt per Brynda Rim. PAC due to BP high during stress the PA states he may have to add another BP medication to help control BP, though not if BP is running normal at home. Pt thanked me for my call.

## 2016-09-01 DIAGNOSIS — Z6822 Body mass index (BMI) 22.0-22.9, adult: Secondary | ICD-10-CM | POA: Diagnosis not present

## 2016-09-01 DIAGNOSIS — G5601 Carpal tunnel syndrome, right upper limb: Secondary | ICD-10-CM | POA: Diagnosis not present

## 2016-09-01 DIAGNOSIS — Z0000 Encounter for general adult medical examination without abnormal findings: Secondary | ICD-10-CM | POA: Diagnosis not present

## 2016-09-04 ENCOUNTER — Telehealth: Payer: Self-pay | Admitting: Gastroenterology

## 2016-09-04 NOTE — Telephone Encounter (Signed)
Samples of Apriso 0.375 g #18 boxes (#72 pills) put at front desk for pt to pick up. Pt informed.  Apriso 0.375 g Lot #: [id number]   Exp: 08/18

## 2016-09-18 ENCOUNTER — Other Ambulatory Visit: Payer: Self-pay | Admitting: Cardiology

## 2016-09-18 ENCOUNTER — Telehealth: Payer: Self-pay | Admitting: Gastroenterology

## 2016-09-18 NOTE — Telephone Encounter (Signed)
Pt has been advised that 6 boxes of samples have been left at the front desk for pickup.

## 2016-09-28 ENCOUNTER — Telehealth: Payer: Self-pay | Admitting: Gastroenterology

## 2016-09-28 MED ORDER — MESALAMINE ER 0.375 G PO CP24: 0.7500 g | ORAL_CAPSULE | Freq: Two times a day (BID) | ORAL | 0 refills | Status: DC

## 2016-09-28 NOTE — Telephone Encounter (Signed)
Talked to patient and informed her that I will put samples at the front desk for her to pick up.

## 2016-10-03 DIAGNOSIS — G5601 Carpal tunnel syndrome, right upper limb: Secondary | ICD-10-CM | POA: Diagnosis not present

## 2016-10-03 DIAGNOSIS — G5602 Carpal tunnel syndrome, left upper limb: Secondary | ICD-10-CM | POA: Diagnosis not present

## 2016-10-03 DIAGNOSIS — M65312 Trigger thumb, left thumb: Secondary | ICD-10-CM | POA: Diagnosis not present

## 2016-10-03 DIAGNOSIS — M65311 Trigger thumb, right thumb: Secondary | ICD-10-CM | POA: Diagnosis not present

## 2016-10-10 ENCOUNTER — Telehealth: Payer: Self-pay | Admitting: Gastroenterology

## 2016-10-10 NOTE — Telephone Encounter (Signed)
Pt informed that we put samples at the front desk for her to pick up. #18 boxes

## 2016-10-31 ENCOUNTER — Telehealth: Payer: Self-pay | Admitting: Gastroenterology

## 2016-10-31 NOTE — Telephone Encounter (Signed)
Pt notified that Apriso samples at the front desk.

## 2016-11-06 DIAGNOSIS — G5602 Carpal tunnel syndrome, left upper limb: Secondary | ICD-10-CM | POA: Diagnosis not present

## 2016-11-06 DIAGNOSIS — G5601 Carpal tunnel syndrome, right upper limb: Secondary | ICD-10-CM | POA: Diagnosis not present

## 2016-11-14 ENCOUNTER — Telehealth: Payer: Self-pay | Admitting: Gastroenterology

## 2016-11-14 MED ORDER — MESALAMINE ER 0.375 G PO CP24: 0.7500 g | ORAL_CAPSULE | Freq: Two times a day (BID) | ORAL | 0 refills | Status: DC

## 2016-11-14 NOTE — Telephone Encounter (Signed)
Patient informed by voice mail on home phone that [place] samples placed at the front desk for her to pick.

## 2016-11-27 ENCOUNTER — Telehealth: Payer: Self-pay | Admitting: Gastroenterology

## 2016-11-27 NOTE — Telephone Encounter (Signed)
Pt aware that the samples are at the front desk for pick up.

## 2016-12-01 DIAGNOSIS — G5602 Carpal tunnel syndrome, left upper limb: Secondary | ICD-10-CM | POA: Diagnosis not present

## 2016-12-01 DIAGNOSIS — G5601 Carpal tunnel syndrome, right upper limb: Secondary | ICD-10-CM | POA: Diagnosis not present

## 2016-12-04 ENCOUNTER — Encounter: Payer: Self-pay | Admitting: Family

## 2016-12-06 ENCOUNTER — Other Ambulatory Visit: Payer: Self-pay | Admitting: Gastroenterology

## 2016-12-06 NOTE — Telephone Encounter (Signed)
8 boxes of Apriso at the front desk for pick up pt is aware

## 2016-12-18 ENCOUNTER — Encounter (HOSPITAL_COMMUNITY): Payer: PPO

## 2016-12-18 ENCOUNTER — Ambulatory Visit: Payer: PPO | Admitting: Family

## 2016-12-18 ENCOUNTER — Telehealth: Payer: Self-pay | Admitting: Gastroenterology

## 2016-12-18 ENCOUNTER — Encounter: Payer: Self-pay | Admitting: *Deleted

## 2016-12-18 NOTE — Telephone Encounter (Signed)
The pt aware that samples at the front desk for pick up.

## 2016-12-19 ENCOUNTER — Telehealth: Payer: Self-pay | Admitting: Gastroenterology

## 2016-12-20 NOTE — Telephone Encounter (Signed)
The pt has been advised that 24 boxes of Apriso was left at the front desk for pick up.

## 2017-01-04 ENCOUNTER — Encounter: Payer: Self-pay | Admitting: Internal Medicine

## 2017-01-04 ENCOUNTER — Ambulatory Visit (INDEPENDENT_AMBULATORY_CARE_PROVIDER_SITE_OTHER): Payer: PPO | Admitting: Internal Medicine

## 2017-01-04 VITALS — BP 116/54 | HR 78 | Ht 66.0 in | Wt 138.0 lb

## 2017-01-04 DIAGNOSIS — I2510 Atherosclerotic heart disease of native coronary artery without angina pectoris: Secondary | ICD-10-CM

## 2017-01-04 DIAGNOSIS — I10 Essential (primary) hypertension: Secondary | ICD-10-CM | POA: Diagnosis not present

## 2017-01-04 DIAGNOSIS — I779 Disorder of arteries and arterioles, unspecified: Secondary | ICD-10-CM

## 2017-01-04 DIAGNOSIS — Z01810 Encounter for preprocedural cardiovascular examination: Secondary | ICD-10-CM | POA: Diagnosis not present

## 2017-01-04 DIAGNOSIS — I739 Peripheral vascular disease, unspecified: Secondary | ICD-10-CM

## 2017-01-04 DIAGNOSIS — R0609 Other forms of dyspnea: Secondary | ICD-10-CM

## 2017-01-04 DIAGNOSIS — R002 Palpitations: Secondary | ICD-10-CM

## 2017-01-04 DIAGNOSIS — E785 Hyperlipidemia, unspecified: Secondary | ICD-10-CM

## 2017-01-04 NOTE — Patient Instructions (Signed)
Medication Instructions:  Your physician recommends that you continue on your current medications as directed. Please refer to the Current Medication list given to you today.   Labwork: None   Testing/Procedures: None  Follow-Up: Your physician wants you to follow-up in: 6 months with Dr End. (February 2019).  You will receive a reminder letter in the mail two months in advance. If you don't receive a letter, please call our office to schedule the follow-up appointment.        If you need a refill on your cardiac medications before your next appointment, please call your pharmacy.

## 2017-01-04 NOTE — Progress Notes (Signed)
Follow-up Outpatient Visit Date: 01/04/2017  Primary Care Provider: Angelina Sheriff, MD [place] [address]  Chief Complaint: Dyspnea on exertion and palpitations  HPI:  [name] is a 76 y.o. year-old female with history of coronary artery disease with NSTEMI and CABG in 11/2010, post-op atrial fibrillation, spasmatic dysphonia, hypertension, and hyperlipidemia, who presents for follow-up. She was previously followed by Dr. Aundra Dubin and was last seen in our office by Richardson Dopp, PA, in February. She was referred for GXT due to palpitations and dyspnea on exertion, which showed frequent PVCs and subtle inferior ST-segment depressions. Subsequent pharmacologic MPI was low risk without ischemia or scar. LVEF was 68%.  Today, [name] reports that she has been doing well. She continues to exercise several days a week and notes palpitations only when climbing stairs. She has mild exertional dyspnea, which previously improved after Botox treatment of her spasmatic dysphonia. She has not followed with [facility] recently. [name] denies chest pain, orthopnea, PND, edema, and lightheadedness.  [name] is planning to undergo right carpal tunnel surgery later this year.  --------------------------------------------------------------------------------------------------  Past Medical History:  Diagnosis Date  . Anemia    "in my early teens"  . Arthritis    neck and back  . Atrial fibrillation ([place])    post-op after CABG;  treated with Amiodarone and coumadin until readmxn with CDiff colitis, perf colon and elevated LFTs (amio and coumadin stopped)  . C. difficile colitis 07/2949   complicated post CABG course with prolonged admission, perforated sigmoid colon;  s/p sigmoid colectomy with [place]p [place] colostomy [name] procedure);  laparotomy wound infection with Pseudomonas treated with antibxs and secondary intention  . Chronic kidney disease    hx kidney stones  .  Coronary artery disease    a. NSTEMI 7/12, cath: pLAD 30%, mLAD 60-70%, dLAD 80%, oOM2 50%, mOM2 40%, trifurcation 80%, pRCA 25%, then occluded, L-R collats, EF 55%;   b. 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 Roxy Manns) // c. ETT 3/18: inf-lat ST depression of 1 mm at 4 min of exercise, frequent ectopy and hypertensive BP response // d. Myoview 4/18: EF 68, no ischemia; low risk   . 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 GERD (gastroesophageal reflux disease)   . H/O hiatal hernia    "slight; not having problems with it"  . Hair loss    after heart surgery  . Hemorrhoids    "none since hemorrhoid OR"  . History of bronchitis    late 1980's  . Hyperlipidemia    takes Pravastatin daily  . Hypertension    takes Metoprolol daily  . Mitral valve prolapse    was told this in the late 1990's; ;then with Dr.Mclean he states that she doesn't  . Myocardial infarction ([place]) 11/2010  . Palpitations 2003   hx of; Holter in 2003 with PACs and PVCs  . Pericarditis 1980   hx of  . 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 S/P colostomy ([place])    due to perf sigmoid colon in setting of CDiff colitis  . Seizures ([place])    "w/verapimil"  . Surgical wound infection    Pseudomonas; wound VAC; secondary intention   Past Surgical History:  Procedure Laterality Date  . ABDOMINAL HYSTERECTOMY     partial hysterectomy  . APPENDECTOMY     as a child  . BOTOX INJECTION  12/03/2015   vocal cords for  spasmotic dysphonia with extreme vocal tremors  . CARDIAC CATHETERIZATION  11/21/10  . CATARACT EXTRACTION, BILATERAL    . CHOLECYSTECTOMY N/A 01/25/2016   Procedure: LAPAROSCOPIC CHOLECYSTECTOMY WITH INTRAOPERATIVE CHOLANGIOGRAM;  Surgeon: [name], MD;  Location: [facility];  Service: General;  Laterality: N/A;  . COLONOSCOPY    . COLOSTOMY RECONNECT    . colostomy reversal  06/27/11  . CORONARY ARTERY BYPASS GRAFT  11-25-2010   CABG X3; LIMA to LAD, SVG to PDA, SVG to OM, EVH via right  thigh  . ESOPHAGOGASTRODUODENOSCOPY    . FLEXIBLE SIGMOIDOSCOPY  11/09/2011   Procedure: FLEXIBLE SIGMOIDOSCOPY;  Surgeon: Milus Banister, MD;  [facility];  Service: Endoscopy;  Laterality: N/A;  . HEMORRHOID SURGERY  ~ 2005   x 2  . OOPHORECTOMY  2005   left; w/"mass removal"  . PROCTOSCOPY  06/27/2011   Procedure: PROCTOSCOPY;  Surgeon: Gayland Curry, MD;  Location: [place];  Service: General;  Laterality: N/A;  . SIGMOIDECTOMY  12/18/2010   with Lionel Woodberry colostomy  . TUBAL LIGATION      Current Meds  Medication Sig  . acetaminophen (TYLENOL) 500 MG tablet Take 1,000 mg by mouth every 6 (six) hours as needed for mild pain.  . Ascorbic Acid (VITAMIN C) 1000 MG tablet Take 1,000 mg by mouth daily.  Marland Kitchen aspirin 81 MG tablet Take 81 mg by mouth daily.  . Bacillus Coagulans-Inulin (PROBIOTIC-PREBIOTIC) 1-250 BILLION-MG CAPS Take 1 capsule by mouth daily.   . Calcium Carb-Cholecalciferol (SM CALCIUM/VITAMIN D) 600-800 MG-UNIT TABS Take 1 tablet by mouth daily.  . Coenzyme Q10 200 MG TABS Take 1 tablet by mouth daily.   . magnesium oxide (MAG-OX) 400 MG tablet Take 1 tablet (400 mg total) by mouth daily.  . mesalamine (APRISO) 0.375 g 24 hr capsule Take 2 capsules (0.75 g total) by mouth 2 (two) times daily.  . Multiple Vitamin (MULTIVITAMIN) tablet Take 1 tablet by mouth daily.   . Omega-3 350 MG CPDR Take 350 mg by mouth daily.  . pravastatin (PRAVACHOL) 80 MG tablet TAKE ONE TABLET BY MOUTH ONCE DAILY IN THE EVENING  . ramipril (ALTACE) 10 MG capsule TAKE 1 CAPSULE BY MOUTH ONCE DAILY    Allergies: Nadolol; Other; Sulfa antibiotics; Sulfamethoxazole; Sulfonamide derivatives; and Verapamil  Social History   Social History  . Marital status: Married    Spouse name: N/A  . Number of children: 1  . Years of education: N/A   Occupational History  . LPN [employer]   Social History Main Topics  . Smoking status: Never Smoker  . Smokeless tobacco: Never Used  . Alcohol use  No  . Drug use: No  . Sexual activity: Yes    Birth control/ protection: Post-menopausal   Other Topics Concern  . Not on file   Social History Narrative  . No narrative on file    Family History  Problem Relation Age of Onset  . Heart attack Father 61  . Heart disease Father        massive heart attack  . Coronary artery disease Mother        [place]  . Heart disease Mother   . Anesthesia problems Neg Hx   . Hypotension Neg Hx   . Malignant hyperthermia Neg Hx   . Pseudochol deficiency Neg Hx   . Colon cancer Neg Hx   . Esophageal cancer Neg Hx   . Stomach cancer Neg Hx   . Rectal  cancer Neg Hx     Review of Systems: A 12-system review of systems was performed and was negative except as noted in the HPI.  --------------------------------------------------------------------------------------------------  Physical Exam: BP (!) 116/54   Pulse 78   Ht 5' 6"  (1.676 m)   Wt 138 lb (62.6 kg)   SpO2 98%   BMI 22.27 kg/m   General:  Slender woman, seated comfortably in the exam room. Voice is somewhat broken. HEENT: No conjunctival pallor or scleral icterus. Moist mucous membranes.  OP clear. Neck: Supple without lymphadenopathy, thyromegaly, JVD, or HJR. Lungs: Normal work of breathing. Clear to auscultation bilaterally without wheezes or crackles. Heart: Regular rate and rhythm without murmurs, rubs, or gallops. Non-displaced PMI. Abd: Bowel sounds present. Soft, NT/ND without hepatosplenomegaly Ext: No lower extremity edema. Radial, PT, and DP pulses are 2+ bilaterally. Skin: Warm and dry without rash.  EKG:  Normal sinus rhythm with rightward axis and non-specific ST segment changes. No change from prior tracing in 06/2016.  Lab Results  Component Value Date   WBC 3.0 (L) 01/24/2016   HGB 13.0 01/24/2016   HCT 41.6 01/24/2016   MCV 88.3 01/24/2016   PLT 112 (L) 01/24/2016    Lab Results  Component Value Date   NA 140 08/04/2016   K 4.1  08/04/2016   CL 100 08/04/2016   CO2 27 08/04/2016   BUN 18 08/04/2016   CREATININE 0.84 08/04/2016   GLUCOSE 104 (H) 08/04/2016   ALT 15 08/04/2016    Lab Results  Component Value Date   CHOL 169 08/04/2016   HDL 64 08/04/2016   LDLCALC 86 08/04/2016   LDLDIRECT 81.8 07/20/2011   TRIG 93 08/04/2016   CHOLHDL 2.6 08/04/2016    --------------------------------------------------------------------------------------------------  ASSESSMENT AND PLAN: Coronary artery disease without angina No chest pain or worsening shortness of breath to suggest worsening coronary insufficiency. MPI this spring was low-risk. We will continue with secondary prevention.  Dyspnea on exertion Chronic problem, previously improved with treatment of spasmatic dysphonia. I encouraged [name] to follow-up with her ENT specialist.  Palpitations Symptoms are isolated to climbing stairs. ETT was notable for PVCs, which likely explain her symptoms. Given history of allergic reaction to nadolol, I am hesitant to add a beta blocker. No further intervention or testing at this time.  Hypertension Blood pressure low-normal today (asymptomatic). No medication changes at this time.  Hyperlipidemia Goal LDL < 70; most recent LDL was 86 in March. We discussed switching to a high-intensity stating or adding ezetimibe; [name] would like to defer this given history of myalgias with other statins in the past. I encouraged lifestyle modifications.  Carotid artery disease Continue routine follow-up with vascular surgery.  Preoperative risk assessment for right carpal tunnel surgery [name] does not express any unstable symptoms. Nuclear stress test in March was also low risk. No further testing is needed prior to this low-risk procedure. I recommend continuation of low-dose aspirin in the perioperative period, if possible.  Follow-up: Return to clinic in 6 months.  Nelva Bush, MD 01/04/2017 10:15 AM

## 2017-01-11 ENCOUNTER — Other Ambulatory Visit: Payer: Self-pay | Admitting: Gastroenterology

## 2017-01-11 NOTE — Telephone Encounter (Signed)
Pt aware Apriso samples at the front desk

## 2017-01-13 DIAGNOSIS — M26629 Arthralgia of temporomandibular joint, unspecified side: Secondary | ICD-10-CM | POA: Insufficient documentation

## 2017-01-13 HISTORY — DX: Arthralgia of temporomandibular joint, unspecified side: M26.629

## 2017-01-23 ENCOUNTER — Other Ambulatory Visit: Payer: Self-pay | Admitting: Gastroenterology

## 2017-01-23 NOTE — Telephone Encounter (Signed)
The pt has been advised that samples are at the front desk for pick up.  She will call with any questions

## 2017-02-01 ENCOUNTER — Telehealth: Payer: Self-pay | Admitting: Gastroenterology

## 2017-02-01 ENCOUNTER — Encounter: Payer: Self-pay | Admitting: Family

## 2017-02-01 ENCOUNTER — Ambulatory Visit (INDEPENDENT_AMBULATORY_CARE_PROVIDER_SITE_OTHER): Payer: PPO | Admitting: Family

## 2017-02-01 ENCOUNTER — Ambulatory Visit (HOSPITAL_COMMUNITY)
Admission: RE | Admit: 2017-02-01 | Discharge: 2017-02-01 | Disposition: A | Discharge status: Home / Self Care | Payer: PPO | Source: Ambulatory Visit | Attending: Surgery | Admitting: Surgery

## 2017-02-01 VITALS — BP 136/75 | HR 66 | Temp 97.5°F | Resp 18 | Ht 66.5 in | Wt 137.7 lb

## 2017-02-01 DIAGNOSIS — I6523 Occlusion and stenosis of bilateral carotid arteries: Secondary | ICD-10-CM

## 2017-02-01 LAB — VAS US CAROTID
LCCADDIAS: -24 cm/s
LCCADSYS: -94 cm/s
LCCAPDIAS: 23 cm/s
LEFT ECA DIAS: -11 cm/s
LEFT VERTEBRAL DIAS: -16 cm/s
LICADDIAS: -30 cm/s
LICADSYS: -99 cm/s
LICAPDIAS: -33 cm/s
LICAPSYS: -134 cm/s
Left CCA prox sys: 114 cm/s
RCCAPSYS: 111 cm/s
RIGHT CCA MID DIAS: -24 cm/s
RIGHT ECA DIAS: -16 cm/s
RIGHT VERTEBRAL DIAS: -13 cm/s
Right CCA prox dias: 17 cm/s
Right cca dist sys: -88 cm/s

## 2017-02-01 NOTE — Telephone Encounter (Signed)
Patient states her medication apriso will run out on Saturday and she is going out of town on Sunday, so pt is requesting more samples.

## 2017-02-01 NOTE — Progress Notes (Signed)
Chief Complaint: Follow up Extracranial Carotid Artery Stenosis   History of Present Illness  [name] is a 76 y.o. female pt of Dr. Trula Slade who returns today for follow up carotid duplex.  She had a duplex in 2012 which revealed moderate bilateral stenosis. This was repeated recently [facility] imaging and she had greater than 70% right carotid stenosis. Peak systolic velocities were to 67 cm/s. Left-sided stenosis was less than 50%. The patient remained asymptomatic.    She denies any known history of stroke or TIA. Specifically she denies a history of amaurosis fugax or monocular blindness, unilateral facial drooping, hemiplegia, or receptive or expressive aphasia.   She has not had previous carotid artery intervention.  She states that she does have occasional numbness in the fingers on her right hand, but this does go away.  She denies any speech difficulties or amaurosis fugax.  She has a hx of CAD with STEMI in 2012 and underwent CABG.  She developed Afib postoperatively, which was treated with amiodarone and coumadin.  She did develop C diff colitis and had a perforated sigmoid colon requiring sigmoid colectomy and end colostomy.  This was reversed in February 2013.  She did go on to be diagnosed with ulcerative colitis.    Also, she has a hx of spasmodic dysphonia and underwent Botox injections at [facility] with improvement.  She states that she needs to return for another injection.  She states that it works well for about 3-6 months.    In September 2017, she did a laparoscopic cholecystectomy with IOC by Dr. [name].  She takes an ACEI for blood pressure management.  She is on a daily baby aspirin.  She takes a statin for cholesterol management.    Pt was last evaluated by S. Rhyne PA-C on 06-19-16. At that time pt's velocities had increased and peak systolic velocity is 010 cm/s (267 cm/s last visit).  She remained asymptomatic. Pt status discussed with Dr. Trula Slade at  that visit and pt was advised to return in 6 months with a carotid duplex.   She was to see Dr. Joya Gaskins at [facility] in the near future for treatment of her spasmodic dysphonia.  Pt Diabetic: no Pt smoker: non-smoker  Pt meds include: Statin : yes ASA: yes Other anticoagulants/antiplatelets: no   Past Medical History:  Diagnosis Date  . Anemia    "in my early teens"  . Arthritis    neck and back  . Atrial fibrillation ([place])    post-op after CABG;  treated with Amiodarone and coumadin until readmxn with CDiff colitis, perf colon and elevated LFTs (amio and coumadin stopped)  . C. difficile colitis 06/7251   complicated post CABG course with prolonged admission, perforated sigmoid colon;  s/p sigmoid colectomy with end colostomy [name] procedure);  laparotomy wound infection with Pseudomonas treated with antibxs and secondary intention  . Chronic kidney disease    hx kidney stones  . Coronary artery disease    a. NSTEMI 7/12, cath: pLAD 30%, mLAD 60-70%, dLAD 80%, oOM2 50%, mOM2 40%, trifurcation 80%, pRCA 25%, then occluded, L-R collats, EF 55%;   b. 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 Roxy Manns) // c. ETT 3/18: inf-lat ST depression of 1 mm at 4 min of exercise, frequent ectopy and hypertensive BP response // d. Myoview 4/18: EF 68, no ischemia; low risk   . 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 GERD (gastroesophageal reflux disease)   . H/O  hiatal hernia    "slight; not having problems with it"  . Hair loss    after heart surgery  . Hemorrhoids    "none since hemorrhoid OR"  . History of bronchitis    late 1980's  . Hyperlipidemia    takes Pravastatin daily  . Hypertension    takes Metoprolol daily  . Mitral valve prolapse    was told this in the late 1990's; ;then with Dr.Mclean he states that she doesn't  . Myocardial infarction ([place]) 11/2010  . Palpitations 2003   hx of; Holter in 2003 with PACs and PVCs  . Pericarditis 1980   hx of  . S/P  CABG x 3 11/25/2010   DR.OWEN  . S/P colostomy ([place])    due to perf sigmoid colon in setting of CDiff colitis  . Seizures ([place])    "w/verapimil"  . Surgical wound infection    Pseudomonas; wound VAC; secondary intention  . 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 Smoking status: Never Smoker  . Smokeless tobacco: Never Used  . Alcohol use No    Family History Family History  Problem Relation Age of Onset  . Heart attack Father 30  . Heart disease Father        massive heart attack  . Coronary artery disease Mother        [place]  . Heart disease Mother   . Anesthesia problems Neg Hx   . Hypotension Neg Hx   . Malignant hyperthermia Neg Hx   . Pseudochol deficiency Neg Hx   . Colon cancer Neg Hx   . Esophageal cancer Neg Hx   . Stomach cancer Neg Hx   . Rectal cancer Neg Hx     Surgical History Past Surgical History:  Procedure Laterality Date  . ABDOMINAL HYSTERECTOMY     partial hysterectomy  . APPENDECTOMY     as a child  . BOTOX INJECTION  12/03/2015   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  . CARDIAC CATHETERIZATION  11/21/10  . CATARACT EXTRACTION, BILATERAL    . CHOLECYSTECTOMY N/A 01/25/2016   Procedure: LAPAROSCOPIC CHOLECYSTECTOMY WITH INTRAOPERATIVE CHOLANGIOGRAM;  Surgeon: [name], MD;  [facility];  Service: General;  Laterality: N/A;  . COLONOSCOPY    . COLOSTOMY RECONNECT    . colostomy reversal  06/27/11  . CORONARY ARTERY BYPASS GRAFT  11-25-2010   CABG X3; LIMA to LAD, SVG to PDA, SVG to OM, EVH via right thigh  . ESOPHAGOGASTRODUODENOSCOPY    . FLEXIBLE SIGMOIDOSCOPY  11/09/2011   Procedure: FLEXIBLE SIGMOIDOSCOPY;  Surgeon: Milus Banister, MD;  [facility];  Service: Endoscopy;  Laterality: N/A;  . HEMORRHOID SURGERY  ~ 2005   x 2  . OOPHORECTOMY  2005   left; w/"mass removal"  . PROCTOSCOPY  06/27/2011   Procedure: PROCTOSCOPY;  Surgeon: Gayland Curry, MD;  Location: [place];  Service: General;  Laterality: N/A;  . SIGMOIDECTOMY  12/18/2010   with end colostomy  . TUBAL LIGATION      Allergies  Allergen Reactions  . Nadolol Hives and Anaphylaxis    Rash and nausea  . Other Other (See Comments) and Anaphylaxis    apples Apples-anaphylaxis  . Sulfa Antibiotics Hives  . Sulfamethoxazole Anaphylaxis  . Sulfonamide Derivatives Anaphylaxis  . Verapamil Other (See Comments) and Anaphylaxis    seizures    Current Outpatient Prescriptions  Medication  Sig Dispense Refill  . acetaminophen (TYLENOL) 500 MG tablet Take 1,000 mg by mouth every 6 (six) hours as needed for mild pain.    . Ascorbic Acid (VITAMIN C) 1000 MG tablet Take 1,000 mg by mouth daily.    Marland Kitchen aspirin 81 MG tablet Take 81 mg by mouth daily.    . Bacillus Coagulans-Inulin (PROBIOTIC-PREBIOTIC) 1-250 BILLION-MG CAPS Take 1 capsule by mouth daily.     . Calcium Carb-Cholecalciferol (SM CALCIUM/VITAMIN D) 600-800 MG-UNIT TABS Take 1 tablet by mouth daily.    . Coenzyme Q10 200 MG TABS Take 1 tablet by mouth daily.   0  . magnesium oxide (MAG-OX) 400 MG tablet Take 1 tablet (400 mg total) by mouth daily. 30 tablet 6  . mesalamine (APRISO) 0.375 g 24 hr capsule Take 2 capsules (0.75 g total) by mouth 2 (two) times daily. 72 capsule 0  . Multiple Vitamin (MULTIVITAMIN) tablet Take 1 tablet by mouth daily.     . Omega-3 350 MG CPDR Take 350 mg by mouth daily.    . pravastatin (PRAVACHOL) 80 MG tablet TAKE ONE TABLET BY MOUTH ONCE DAILY IN THE EVENING 90 tablet 2  . ramipril (ALTACE) 10 MG capsule TAKE 1 CAPSULE BY MOUTH ONCE DAILY 90 capsule 3   No current facility-administered medications for this visit.     Review of Systems : See HPI for pertinent positives and negatives.  Physical Examination  Vitals:   02/01/17 1327 02/01/17 1329  BP: 135/70 136/75  Pulse: 66   Resp: 18   Temp: (!) 97.5 F (36.4 C)   TempSrc: Oral   Weight: 137 lb 11.2 oz (62.5 kg)    Height: 5' 6.5" (1.689 m)    Body mass index is 21.89 kg/m.  General: WDWN female in NAD GAIT: normal Eyes: PERRLA Pulmonary:  Respirations are non-labored, good air movement, CTAB, no rales, rhonchi, or wheezing.  Cardiac: regular rhythm, no detected murmur.  VASCULAR EXAM Carotid Bruits Right Left   Negative Negative     Abdominal aortic pulse is not palpable. Radial pulses are 2+ palpable and equal.                                                                                                                            LE Pulses Right Left       POPLITEAL  not palpable   not palpable       POSTERIOR TIBIAL  2+t palpable   2+ palpable        DORSALIS PEDIS      ANTERIOR TIBIAL 1+ palpable  2+ palpable     Gastrointestinal: soft, nontender, BS WNL, no r/g, no palpable masses.  Musculoskeletal: No muscle atrophy/wasting. M/S 5/5 throughout, extremities without ischemic changes.  Neurologic:  A&O X 3; appropriate affect, sensation is normal; speech is normal, CN 2-12 intact, pain and light touch intact in extremities, motor exam as listed above.    Assessment: Royetta Asal  is a 76 y.o. female who has no hx of stroke or TIA.  Fortunately she does not have DM and has never used tobacco. She takes a daily ASA and a statin. She remains active. She has a normal BMI.   DATA Carotid Duplex (02/01/17): Right ICA: 60-79% stenosis. Left ICA: 1-39% stenosis.  Bilateral vertebral artery flow is antegrade.  Bilateral subclavian artery waveforms are normal.  No significant change compared to the last exam on 06-19-16.   Plan: Follow-up in 6 months with Carotid Duplex scan.   I discussed in depth with the patient the nature of atherosclerosis, and emphasized the importance of maximal medical management including strict control of blood pressure, blood glucose, and lipid levels, obtaining regular exercise, and continued cessation of smoking.  The patient is aware that  without maximal medical management the underlying atherosclerotic disease process will progress, limiting the benefit of any interventions. The patient was given information about stroke prevention and what symptoms should prompt the patient to seek immediate medical care. Thank you for allowing [place] to participate in this patient's care.  Clemon Chambers, RN, MSN, FNP-C [facility] Office: [facility] Physician: Oneida Alar  02/01/17 1:35 PM

## 2017-02-01 NOTE — Patient Instructions (Addendum)
Stroke Prevention Some medical conditions and behaviors are associated with an increased chance of having a stroke. You may prevent a stroke by making healthy choices and managing medical conditions. How can I reduce my risk of having a stroke?  Stay physically active. Get at least 30 minutes of activity on most or all days.  Do not smoke. It may also be helpful to avoid exposure to secondhand smoke.  Limit alcohol use. Moderate alcohol use is considered to be: ? No more than 2 drinks per day for men. ? No more than 1 drink per day for nonpregnant women.  Eat healthy foods. This involves: ? Eating 5 or more servings of fruits and vegetables a day. ? Making dietary changes that address high blood pressure (hypertension), high cholesterol, diabetes, or obesity.  Manage your cholesterol levels. ? Making food choices that are high in fiber and low in saturated fat, trans fat, and cholesterol may control cholesterol levels. ? Take any prescribed medicines to control cholesterol as directed by your health care provider.  Manage your diabetes. ? Controlling your carbohydrate and sugar intake is recommended to manage diabetes. ? Take any prescribed medicines to control diabetes as directed by your health care provider.  Control your hypertension. ? Making food choices that are low in salt (sodium), saturated fat, trans fat, and cholesterol is recommended to manage hypertension. ? Ask your health care provider if you need treatment to lower your blood pressure. Take any prescribed medicines to control hypertension as directed by your health care provider. ? If you are 18-39 years of age, have your blood pressure checked every 3-5 years. If you are 40 years of age or older, have your blood pressure checked every year.  Maintain a healthy weight. ? Reducing calorie intake and making food choices that are low in sodium, saturated fat, trans fat, and cholesterol are recommended to manage  weight.  Stop drug abuse.  Avoid taking birth control pills. ? Talk to your health care provider about the risks of taking birth control pills if you are over 35 years old, smoke, get migraines, or have ever had a blood clot.  Get evaluated for sleep disorders (sleep apnea). ? Talk to your health care provider about getting a sleep evaluation if you snore a lot or have excessive sleepiness.  Take medicines only as directed by your health care provider. ? For some people, aspirin or blood thinners (anticoagulants) are helpful in reducing the risk of forming abnormal blood clots that can lead to stroke. If you have the irregular heart rhythm of atrial fibrillation, you should be on a blood thinner unless there is a good reason you cannot take them. ? Understand all your medicine instructions.  Make sure that other conditions (such as anemia or atherosclerosis) are addressed. Get help right away if:  You have sudden weakness or numbness of the face, arm, or leg, especially on one side of the body.  Your face or eyelid droops to one side.  You have sudden confusion.  You have trouble speaking (aphasia) or understanding.  You have sudden trouble seeing in one or both eyes.  You have sudden trouble walking.  You have dizziness.  You have a loss of balance or coordination.  You have a sudden, severe headache with no known cause.  You have new chest pain or an irregular heartbeat. Any of these symptoms may represent a serious problem that is an emergency. Do not wait to see if the symptoms will go away.   Get medical help at once. Call your local emergency services (911 in [place] Do not drive yourself to the hospital. This information is not intended to replace advice given to you by your health care provider. Make sure you discuss any questions you have with your health care provider. Document Released: 06/08/2004 Document Revised: 10/07/2015 Document Reviewed: 11/01/2012 Elsevier  Interactive Patient Education  2017 Elsevier Inc.     Preventing Cerebrovascular Disease Arteries are blood vessels that carry blood that contains oxygen from the heart to all parts of the body. Cerebrovascular disease affects arteries that supply the brain. Any condition that blocks or disrupts blood flow to the brain can cause cerebrovascular disease. Brain cells that lose blood supply start to die within minutes (stroke). Stroke is the main danger of cerebrovascular disease. Atherosclerosis and high blood pressure are common causes of cerebrovascular disease. Atherosclerosis is narrowing and hardening of an artery that results when fat, cholesterol, calcium, or other substances (plaque) build up inside an artery. Plaque reduces blood flow through the artery. High blood pressure increases the risk of bleeding inside the brain. Making diet and lifestyle changes to prevent atherosclerosis and high blood pressure lowers your risk of cerebrovascular disease. What nutrition changes can be made?  Eat more fruits, vegetables, and whole grains.  Reduce how much saturated fat you eat. To do this, eat less red meat and fewer full-fat dairy products.  Eat healthy proteins instead of red meat. Healthy proteins include: ? Fish. Eat fish that contains heart-healthy omega-3 fatty acids, twice a week. Examples include salmon, albacore tuna, mackerel, and herring. ? Chicken. ? Nuts. ? Low-fat or nonfat yogurt.  Avoid processed meats, like bacon and lunchmeat.  Avoid foods that contain: ? A lot of sugar, such as sweets and drinks with added sugar. ? A lot of salt (sodium). Avoid adding extra salt to your food, as told by your health care provider. ? Trans fats, such as margarine and baked goods. Trans fats may be listed as "partially hydrogenated oils" on food labels.  Check food labels to see how much sodium, sugar, and trans fats are in foods.  Use vegetable oils that contain low amounts of  saturated fat, such as olive oil or canola oil. What lifestyle changes can be made?  Drink alcohol in moderation. This means no more than 1 drink a day for nonpregnant women and 2 drinks a day for men. One drink equals 12 oz of beer, 5 oz of wine, or 1 oz of hard liquor.  If you are overweight, ask your health care provider to recommend a weight-loss plan for you. Losing 5-10 lb (2.2-4.5 kg) can reduce your risk of diabetes, atherosclerosis, and high blood pressure.  Exercise for 30?60 minutes on most days, or as much as told by your health care provider. ? Do moderate-intensity exercise, such as brisk walking, bicycling, and water aerobics. Ask your health care provider which activities are safe for you.  Do not use any products that contain nicotine or tobacco, such as cigarettes and e-cigarettes. If you need help quitting, ask your health care provider. Why are these changes important? Making these changes lowers your risk of many diseases that can cause cerebrovascular disease and stroke. Stroke is a leading cause of death and disability. Making these changes also improves your overall health and quality of life. What can I do to lower my risk? The following factors make you more likely to develop cerebrovascular disease:  Being overweight.  Smoking.  Being physically inactive.    Eating a high-fat diet.  Having certain health conditions, such as: ? Diabetes. ? High blood pressure. ? Heart disease. ? Atherosclerosis. ? High cholesterol. ? Sickle cell disease.  Talk with your health care provider about your risk for cerebrovascular disease. Work with your health care provider to control diseases that you have that may contribute to cerebrovascular disease. Your health care provider may prescribe medicines to help prevent major causes of cerebrovascular disease. Where to find more information: Learn more about preventing cerebrovascular disease from:  National Heart, Lung, and  Blood Institute: www.nhlbi.nih.gov/health/health-topics/topics/stroke  Centers for Disease Control and Prevention: cdc.gov/stroke/about.htm  Summary  Cerebrovascular disease can lead to a stroke.  Atherosclerosis and high blood pressure are major causes of cerebrovascular disease.  Making diet and lifestyle changes can reduce your risk of cerebrovascular disease.  Work with your health care provider to get your risk factors under control to reduce your risk of cerebrovascular disease. This information is not intended to replace advice given to you by your health care provider. Make sure you discuss any questions you have with your health care provider. Document Released: 05/16/2015 Document Revised: 11/19/2015 Document Reviewed: 05/16/2015 Elsevier Interactive Patient Education  2018 Elsevier Inc.  

## 2017-02-01 NOTE — Telephone Encounter (Signed)
12 boxes of apriso were left at the front desk for pick up the pt is aware

## 2017-02-08 NOTE — Addendum Note (Signed)
Addended by: Lianne Cure A on: 02/08/2017 03:58 PM   Modules accepted: Orders

## 2017-02-12 ENCOUNTER — Telehealth: Payer: Self-pay | Admitting: Gastroenterology

## 2017-02-13 NOTE — Telephone Encounter (Signed)
Samples left up front for pt to pick up, message left for pt.

## 2017-03-02 DIAGNOSIS — Z23 Encounter for immunization: Secondary | ICD-10-CM | POA: Diagnosis not present

## 2017-03-07 ENCOUNTER — Telehealth: Payer: Self-pay | Admitting: Gastroenterology

## 2017-03-08 NOTE — Telephone Encounter (Signed)
Spoke to patient, let her know that I have some samples for her and will leave them at the front desk. Apriso 0.375 g qty 48, lot #[id number], exp 03/2019. I asked patient if she has applied through the drug company for patient assistance. She states she does not qualify for it and cannot afford the $500.00 monthly cost. I am not sure where she applied for assistance, hoping there is another option, told her I would ask Patty about this.Patient has been receiving samples since January.

## 2017-03-08 NOTE — Telephone Encounter (Signed)
Patient calling back regarding this. Best [phone]

## 2017-03-12 DIAGNOSIS — G5601 Carpal tunnel syndrome, right upper limb: Secondary | ICD-10-CM | POA: Diagnosis not present

## 2017-03-21 ENCOUNTER — Telehealth: Payer: Self-pay | Admitting: Gastroenterology

## 2017-03-21 NOTE — Telephone Encounter (Signed)
The pt has been notified of the samples left at the front desk for pick up  #24 boxes Lot Number: [id number] Exp:11/20

## 2017-03-23 DIAGNOSIS — G5601 Carpal tunnel syndrome, right upper limb: Secondary | ICD-10-CM | POA: Diagnosis not present

## 2017-04-02 ENCOUNTER — Telehealth: Payer: Self-pay | Admitting: Gastroenterology

## 2017-04-02 DIAGNOSIS — J069 Acute upper respiratory infection, unspecified: Secondary | ICD-10-CM | POA: Diagnosis not present

## 2017-04-02 DIAGNOSIS — Z6822 Body mass index (BMI) 22.0-22.9, adult: Secondary | ICD-10-CM | POA: Diagnosis not present

## 2017-04-03 NOTE — Telephone Encounter (Signed)
The pt has been advised that samples are at the front desk.  She did state that her insurance will begin coverage of apriso at $45 a month at the beginning of the year.  I did tell her I would try to keep her in samples until January

## 2017-04-09 ENCOUNTER — Telehealth: Payer: Self-pay | Admitting: Gastroenterology

## 2017-04-09 NOTE — Telephone Encounter (Signed)
Called and let the pt know that we are out of samples right now but [name] will call her when we get some in.

## 2017-04-16 ENCOUNTER — Telehealth: Payer: Self-pay | Admitting: Gastroenterology

## 2017-04-16 NOTE — Telephone Encounter (Signed)
24 boxes of Apriso have been left at the front desk for pick up.  The pt has been notified

## 2017-06-11 ENCOUNTER — Other Ambulatory Visit: Payer: Self-pay | Admitting: Gastroenterology

## 2017-06-14 DIAGNOSIS — H5202 Hypermetropia, left eye: Secondary | ICD-10-CM | POA: Diagnosis not present

## 2017-06-14 DIAGNOSIS — D3131 Benign neoplasm of right choroid: Secondary | ICD-10-CM | POA: Diagnosis not present

## 2017-06-14 DIAGNOSIS — H52223 Regular astigmatism, bilateral: Secondary | ICD-10-CM | POA: Diagnosis not present

## 2017-06-19 ENCOUNTER — Other Ambulatory Visit: Payer: Self-pay | Admitting: Cardiology

## 2017-06-26 DIAGNOSIS — Z9181 History of falling: Secondary | ICD-10-CM | POA: Diagnosis not present

## 2017-06-26 DIAGNOSIS — Z1339 Encounter for screening examination for other mental health and behavioral disorders: Secondary | ICD-10-CM | POA: Diagnosis not present

## 2017-06-26 DIAGNOSIS — J209 Acute bronchitis, unspecified: Secondary | ICD-10-CM | POA: Diagnosis not present

## 2017-06-26 DIAGNOSIS — Z79899 Other long term (current) drug therapy: Secondary | ICD-10-CM | POA: Diagnosis not present

## 2017-06-26 DIAGNOSIS — K51919 Ulcerative colitis, unspecified with unspecified complications: Secondary | ICD-10-CM | POA: Diagnosis not present

## 2017-06-26 DIAGNOSIS — Z780 Asymptomatic menopausal state: Secondary | ICD-10-CM | POA: Diagnosis not present

## 2017-06-26 DIAGNOSIS — R5383 Other fatigue: Secondary | ICD-10-CM | POA: Diagnosis not present

## 2017-06-26 DIAGNOSIS — E785 Hyperlipidemia, unspecified: Secondary | ICD-10-CM | POA: Diagnosis not present

## 2017-06-26 DIAGNOSIS — I10 Essential (primary) hypertension: Secondary | ICD-10-CM | POA: Diagnosis not present

## 2017-06-26 DIAGNOSIS — Z6822 Body mass index (BMI) 22.0-22.9, adult: Secondary | ICD-10-CM | POA: Diagnosis not present

## 2017-06-26 DIAGNOSIS — R0989 Other specified symptoms and signs involving the circulatory and respiratory systems: Secondary | ICD-10-CM | POA: Diagnosis not present

## 2017-06-26 DIAGNOSIS — Z1331 Encounter for screening for depression: Secondary | ICD-10-CM | POA: Diagnosis not present

## 2017-07-06 ENCOUNTER — Encounter: Payer: Self-pay | Admitting: Internal Medicine

## 2017-07-06 ENCOUNTER — Ambulatory Visit: Payer: PPO | Admitting: Internal Medicine

## 2017-07-06 VITALS — BP 118/50 | HR 72 | Ht 66.5 in | Wt 137.8 lb

## 2017-07-06 DIAGNOSIS — I10 Essential (primary) hypertension: Secondary | ICD-10-CM | POA: Diagnosis not present

## 2017-07-06 DIAGNOSIS — I2510 Atherosclerotic heart disease of native coronary artery without angina pectoris: Secondary | ICD-10-CM | POA: Diagnosis not present

## 2017-07-06 DIAGNOSIS — J383 Other diseases of vocal cords: Secondary | ICD-10-CM

## 2017-07-06 DIAGNOSIS — E785 Hyperlipidemia, unspecified: Secondary | ICD-10-CM

## 2017-07-06 DIAGNOSIS — I6523 Occlusion and stenosis of bilateral carotid arteries: Secondary | ICD-10-CM

## 2017-07-06 NOTE — Progress Notes (Signed)
Follow-up Outpatient Visit Date: 07/06/2017  Primary Care Provider: Angelina Sheriff, MD [place] [id number]  Chief Complaint: Dyspnea on exertion  HPI:  [name] is a 77 y.o. year-old female with history of coronary artery disease with NSTEMI and CABG in 11/2010, post-op atrial fibrillation, spasmatic dysphonia, hypertension, and hyperlipidemia, who presents for follow-up of CAD and palpitations. I last saw her in August, at which time she was doing well. She noted only occasional palpitations when climbing stairs. She also reported exertional dyspnea, that seemed to be related to her spasmodic dysphonia. Preceding stress test in 08/2016 was low risk without ischemia or scar. We discussed escalation of statin therapy, given suboptimal LDL control. However, [name] wished to defer medication changes.  Today, [name] reports feeling well. She still has some exertional dyspnea that she attributes to her spasmodic dysphonia, unchanged from our last visit. She has been unable to follow-up at [facility] since our last visit but is hoping to have her insurance issues worked out before her scheduled appointment on 07/20/17. [name] reports occasional brief, sharp left lower chest discomfort, happening less than once a month. It is not exertional and comes on randomly. It typically only lasts a few seconds and has been present off and on for years.. She reports only very rare palpitations without accompanying symptoms. She noted mild lightheadedness in the setting of a viral infection a few weeks ago. This has since resolved.  --------------------------------------------------------------------------------------------------  Past Medical History:  Diagnosis Date  . Anemia    "in my early teens"  . Arthritis    neck and back  . Atrial fibrillation ([place])    post-op after CABG;  treated with Amiodarone and coumadin until readmxn with CDiff colitis, perf colon and  elevated LFTs (amio and coumadin stopped)  . C. difficile colitis 11/784   complicated post CABG course with prolonged admission, perforated sigmoid colon;  s/p sigmoid colectomy with Tequilla Cousineau colostomy [name] procedure);  laparotomy wound infection with Pseudomonas treated with antibxs and secondary intention  . Chronic kidney disease    hx kidney stones  . Coronary artery disease    a. NSTEMI 7/12, cath: pLAD 30%, mLAD 60-70%, dLAD 80%, oOM2 50%, mOM2 40%, trifurcation 80%, pRCA 25%, then occluded, L-R collats, EF 55%;   b. 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 Roxy Manns) // c. ETT 3/18: inf-lat ST depression of 1 mm at 4 min of exercise, frequent ectopy and hypertensive BP response // d. Myoview 4/18: EF 68, no ischemia; low risk   . 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 GERD (gastroesophageal reflux disease)   . H/O hiatal hernia    "slight; not having problems with it"  . Hair loss    after heart surgery  . Hemorrhoids    "none since hemorrhoid OR"  . History of bronchitis    late 1980's  . Hyperlipidemia    takes Pravastatin daily  . Hypertension    takes Metoprolol daily  . Mitral valve prolapse    was told this in the late 1990's; ;then with Dr.Mclean he states that she doesn't  . Myocardial infarction ([place]) 11/2010  . Palpitations 2003   hx of; Holter in 2003 with PACs and PVCs  . Pericarditis 1980   hx of  . 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 S/P colostomy ([place])    due to perf sigmoid colon in setting of CDiff  colitis  . Seizures ([place])    "w/verapimil"  . Surgical wound infection    Pseudomonas; wound VAC; secondary intention  . 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 ABDOMINAL HYSTERECTOMY     partial hysterectomy  . APPENDECTOMY     as a child  . BOTOX INJECTION  12/03/2015   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  . CARDIAC CATHETERIZATION  11/21/10  . CATARACT  EXTRACTION, BILATERAL    . CHOLECYSTECTOMY N/A 01/25/2016   Procedure: LAPAROSCOPIC CHOLECYSTECTOMY WITH INTRAOPERATIVE CHOLANGIOGRAM;  Surgeon: [name], MD;  Location: [facility];  Service: General;  Laterality: N/A;  . COLONOSCOPY    . COLOSTOMY RECONNECT    . colostomy reversal  06/27/11  . CORONARY ARTERY BYPASS GRAFT  11-25-2010   CABG X3; LIMA to LAD, SVG to PDA, SVG to OM, EVH via right thigh  . ESOPHAGOGASTRODUODENOSCOPY    . FLEXIBLE SIGMOIDOSCOPY  11/09/2011   Procedure: FLEXIBLE SIGMOIDOSCOPY;  Surgeon: Milus Banister, MD;  Location: [facility];  Service: Endoscopy;  Laterality: N/A;  . HEMORRHOID SURGERY  ~ 2005   x 2  . OOPHORECTOMY  2005   left; w/"mass removal"  . PROCTOSCOPY  06/27/2011   Procedure: PROCTOSCOPY;  Surgeon: Gayland Curry, MD;  Location: [facility];  Service: General;  Laterality: N/A;  . SIGMOIDECTOMY  12/18/2010   with Rayyan Burley colostomy  . TUBAL LIGATION      Current Meds  Medication Sig  . acetaminophen (TYLENOL) 500 MG tablet Take 1,000 mg by mouth every 6 (six) hours as needed for mild pain.  . APRISO 0.375 g 24 hr capsule TAKE 2 CAPSULES BY MOUTH TWICE DAILY  . Ascorbic Acid (VITAMIN C) 1000 MG tablet Take 1,000 mg by mouth daily.  Marland Kitchen aspirin 81 MG tablet Take 81 mg by mouth daily.  . Bacillus Coagulans-Inulin (PROBIOTIC-PREBIOTIC) 1-250 BILLION-MG CAPS Take 1 capsule by mouth daily.   . Calcium Carb-Cholecalciferol (SM CALCIUM/VITAMIN D) 600-800 MG-UNIT TABS Take 1 tablet by mouth daily.  . Coenzyme Q10 200 MG TABS Take 1 tablet by mouth daily.   . magnesium oxide (MAG-OX) 400 MG tablet Take 1 tablet (400 mg total) by mouth daily.  . mesalamine (APRISO) 0.375 g 24 hr capsule Take 2 capsules (0.75 g total) by mouth 2 (two) times daily.  . Multiple Vitamin (MULTIVITAMIN) tablet Take 1 tablet by mouth daily.   . Omega-3 350 MG CPDR Take 350 mg by mouth daily.  . pravastatin (PRAVACHOL) 80 MG tablet TAKE 1 TABLET BY MOUTH ONCE DAILY IN THE EVENING  . ramipril  (ALTACE) 10 MG capsule TAKE 1 CAPSULE BY MOUTH ONCE DAILY    Allergies: Nadolol; Other; Sulfa antibiotics; Sulfamethoxazole; Sulfonamide derivatives; and Verapamil  Social History   Socioeconomic History  . Marital status: Married    Spouse name: Not on file  . Number of children: 1  . Years of education: Not on file  . Highest education level: Not on file  Social Needs  . Financial resource strain: Not on file  . Food insecurity - worry: Not on file  . Food insecurity - inability: Not on file  . Transportation needs - medical: Not on file  . Transportation needs - non-medical: Not on file  Occupational History  . Occupation: LPN    Employer: [employer]  Tobacco Use  . Smoking status: Never Smoker  . Smokeless tobacco: Never Used  Substance and Sexual Activity  . Alcohol  use: No  . Drug use: No  . Sexual activity: Yes    Birth control/protection: Post-menopausal  Other Topics Concern  . Not on file  Social History Narrative  . Not on file    Family History  Problem Relation Age of Onset  . Heart attack Father 78  . Heart disease Father        massive heart attack  . Coronary artery disease Mother        [place]  . Heart disease Mother   . Anesthesia problems Neg Hx   . Hypotension Neg Hx   . Malignant hyperthermia Neg Hx   . Pseudochol deficiency Neg Hx   . Colon cancer Neg Hx   . Esophageal cancer Neg Hx   . Stomach cancer Neg Hx   . Rectal cancer Neg Hx     Review of Systems: A 12-system review of systems was performed and was negative except as noted in the HPI.  --------------------------------------------------------------------------------------------------  Physical Exam: BP (!) 118/50   Pulse 72   Ht 5' 6.5" (1.689 m)   Wt 137 lb 12.8 oz (62.5 kg)   SpO2 98%   BMI 21.91 kg/m   General:  Thin woman, seated comfortably in the exam room. HEENT: No conjunctival pallor or scleral icterus. Moist mucous membranes.  OP  clear. Neck: Supple without lymphadenopathy, thyromegaly, JVD, or HJR.  Lungs: Normal work of breathing. Clear to auscultation bilaterally without wheezes or crackles. Heart: Regular rate and rhythm without murmurs, rubs, or gallops. Non-displaced PMI. Abd: Bowel sounds present. Soft, NT/ND without hepatosplenomegaly Ext: No lower extremity edema. Radial, PT, and DP pulses are 2+ bilaterally. Skin: Warm and dry without rash.  Lab Results  Component Value Date   WBC 3.0 (L) 01/24/2016   HGB 13.0 01/24/2016   HCT 41.6 01/24/2016   MCV 88.3 01/24/2016   PLT 112 (L) 01/24/2016    Lab Results  Component Value Date   NA 140 08/04/2016   K 4.1 08/04/2016   CL 100 08/04/2016   CO2 27 08/04/2016   BUN 18 08/04/2016   CREATININE 0.84 08/04/2016   GLUCOSE 104 (H) 08/04/2016   ALT 15 08/04/2016    Lab Results  Component Value Date   CHOL 169 08/04/2016   HDL 64 08/04/2016   LDLCALC 86 08/04/2016   LDLDIRECT 81.8 07/20/2011   TRIG 93 08/04/2016   CHOLHDL 2.6 08/04/2016   Outside labs (06/26/17): CBC: WBC 3.5, HGB 12.4, HCT 37, PLT 168  CMP: Na 139,  K3.9, Cl 104, CO2 29, BUN 23, creatinine 0.7, glucose 116, Ca 9.3, AST 19, ALT 13, alkaline phosphatase 58, bili 0.4, total protein 6.8, albumin 3.9  Lipid panel (nonfasting): Total cholesterol 147, HDL 48, LDL 67, triglyceride 160  --------------------------------------------------------------------------------------------------  ASSESSMENT AND PLAN: Coronary artery disease without angina Ms. Lievanos continues to do well. Her rare, sharp, chest pain lasting only a few seconds is not suggestive of worsening coronary insufficiency. Myocardial perfusion stress test last year was low risk. We will continue current medications for secondary prevention.  Spasmodic dysphonia Chronic dyspnea of exertion is likely related to this and unchanged from her last visit. Hopefully, Ms. Decarolis will be able to follow-up at [facility] for ongoing  treatment.  Hypertension Blood pressure well controlled today. No symptoms of hypotension. We will continue current dose of ramipril.  Hyperlipidemia Recent lipid panel shows adequate LDL control (less than 70). Ms. Fern has been hesitant to escalate therapy in the past.  We will continue her current combination of pravastatin 80 mg daily.  Carotid artery disease Continue follow-up with vascular surgery.  Follow-up: Return to clinic in 1 year.  Nelva Bush, MD 07/07/2017 1:44 PM

## 2017-07-06 NOTE — Patient Instructions (Addendum)
Medication Instructions:  Your physician recommends that you continue on your current medications as directed. Please refer to the Current Medication list given to you today.  -- If you need a refill on your cardiac medications before your next appointment, please call your pharmacy. --  Labwork: None ordered  Testing/Procedures: None ordered  Follow-Up: Your physician wants you to follow-up in: 1 year with Dr. Saunders Revel.    You will receive a reminder letter in the mail two months in advance. If you don't receive a letter, please call our office to schedule the follow-up appointment.  Thank you for choosing [facility]!!    Any Other Special Instructions Will Be Listed Below (If Applicable).

## 2017-07-07 ENCOUNTER — Encounter: Payer: Self-pay | Admitting: Internal Medicine

## 2017-07-07 DIAGNOSIS — I6523 Occlusion and stenosis of bilateral carotid arteries: Secondary | ICD-10-CM | POA: Insufficient documentation

## 2017-07-07 DIAGNOSIS — J383 Other diseases of vocal cords: Secondary | ICD-10-CM | POA: Insufficient documentation

## 2017-07-17 ENCOUNTER — Other Ambulatory Visit (HOSPITAL_COMMUNITY): Payer: Self-pay | Admitting: Cardiology

## 2017-07-17 DIAGNOSIS — I10 Essential (primary) hypertension: Secondary | ICD-10-CM

## 2017-07-19 DIAGNOSIS — D729 Disorder of white blood cells, unspecified: Secondary | ICD-10-CM | POA: Diagnosis not present

## 2017-07-20 DIAGNOSIS — J385 Laryngeal spasm: Secondary | ICD-10-CM | POA: Diagnosis not present

## 2017-07-26 DIAGNOSIS — J209 Acute bronchitis, unspecified: Secondary | ICD-10-CM | POA: Diagnosis not present

## 2017-07-26 DIAGNOSIS — Z6822 Body mass index (BMI) 22.0-22.9, adult: Secondary | ICD-10-CM | POA: Diagnosis not present

## 2017-07-26 DIAGNOSIS — Z1231 Encounter for screening mammogram for malignant neoplasm of breast: Secondary | ICD-10-CM | POA: Diagnosis not present

## 2017-08-06 ENCOUNTER — Encounter: Payer: Self-pay | Admitting: Family

## 2017-08-06 ENCOUNTER — Ambulatory Visit (HOSPITAL_COMMUNITY)
Admission: RE | Admit: 2017-08-06 | Discharge: 2017-08-06 | Disposition: A | Discharge status: Home / Self Care | Payer: PPO | Source: Ambulatory Visit | Attending: Vascular Surgery | Admitting: Vascular Surgery

## 2017-08-06 ENCOUNTER — Ambulatory Visit: Payer: PPO | Admitting: Family

## 2017-08-06 VITALS — BP 133/75 | HR 59 | Temp 98.3°F | Resp 17 | Ht 66.0 in | Wt 138.2 lb

## 2017-08-06 DIAGNOSIS — I6523 Occlusion and stenosis of bilateral carotid arteries: Secondary | ICD-10-CM

## 2017-08-06 DIAGNOSIS — D729 Disorder of white blood cells, unspecified: Secondary | ICD-10-CM | POA: Diagnosis not present

## 2017-08-06 NOTE — Patient Instructions (Signed)

## 2017-08-06 NOTE — Progress Notes (Signed)
Chief Complaint: Follow up Extracranial Carotid Artery Stenosis   History of Present Illness  [name] is a 77 y.o. female whom Dr. Trula Slade has been monitoring for extracranial carotid artery stenosis. She had a duplex in 2012 which revealed moderate bilateral stenosis. This was repeated at [place] and she had greater than 70% right carotid stenosis. Peak systolic velocities were to 67 cm/s. Left-sided stenosis was less than 50%. The patient remained asymptomatic.   She denies any known history of stroke or TIA. Specifically she deniesa history of amaurosis fugax or monocular blindness, unilateral facial drooping, hemiplegia, orreceptive or expressive aphasia.   She has not had previous carotid artery intervention.  She states that she does have occasional numbness in the fingers on her right hand, but this does go away. She denies any speech difficulties or amaurosis fugax.  She has a hx of CAD with STEMI in 2012 and underwent CABG. She developed Afib postoperatively, which was treated with amiodarone and coumadin. She did develop C diff colitis and had a perforated sigmoid colon requiring sigmoid colectomy and end colostomy. This was reversed in February 2013. She did go on to be diagnosed with ulcerative colitis.   Also, she has a hx of spasmodic dysphonia and underwent Botox injections at [facility] with improvement. She states that she needs to return for another injection. She states that it works well for about 3-6 months.   In September 2017, she did a laparoscopic cholecystectomy with IOC by Dr. [name].   Pt Diabetic: no Pt smoker: non-smoker  Pt meds include: Statin : yes ASA: yes Other anticoagulants/antiplatelets: no    Past Medical History:  Diagnosis Date  . Anemia    "in my early teens"  . Arthritis    neck and back  . Atrial fibrillation ([place])    post-op after CABG;  treated with Amiodarone and coumadin until readmxn with  CDiff colitis, perf colon and elevated LFTs (amio and coumadin stopped)  . C. difficile colitis 06/6946   complicated post CABG course with prolonged admission, perforated sigmoid colon;  s/p sigmoid colectomy with end colostomy [name] procedure);  laparotomy wound infection with Pseudomonas treated with antibxs and secondary intention  . Chronic kidney disease    hx kidney stones  . Coronary artery disease    a. NSTEMI 7/12, cath: pLAD 30%, mLAD 60-70%, dLAD 80%, oOM2 50%, mOM2 40%, trifurcation 80%, pRCA 25%, then occluded, L-R collats, EF 55%;   b. 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 Roxy Manns) // c. ETT 3/18: inf-lat ST depression of 1 mm at 4 min of exercise, frequent ectopy and hypertensive BP response // d. Myoview 4/18: EF 68, no ischemia; low risk   . 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 GERD (gastroesophageal reflux disease)   . H/O hiatal hernia    "slight; not having problems with it"  . Hair loss    after heart surgery  . Hemorrhoids    "none since hemorrhoid OR"  . History of bronchitis    late 1980's  . Hyperlipidemia    takes Pravastatin daily  . Hypertension    takes Metoprolol daily  . Mitral valve prolapse    was told this in the late 1990's; ;then with Dr.Mclean he states that she doesn't  . Myocardial infarction ([place]) 11/2010  . Palpitations 2003   hx of; Holter in 2003 with PACs and PVCs  . Pericarditis 1980   hx of  . S/P  CABG x 3 11/25/2010   DR.OWEN  . S/P colostomy ([place])    due to perf sigmoid colon in setting of CDiff colitis  . Seizures ([place])    "w/verapimil"  . Surgical wound infection    Pseudomonas; wound VAC; secondary intention  . 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 Smoking status: Never Smoker  . Smokeless tobacco: Never Used  Substance Use Topics  . Alcohol use: No  . Drug use: No    Family History Family History  Problem Relation Age of Onset  . Heart attack  Father 66  . Heart disease Father        massive heart attack  . Coronary artery disease Mother        [place]  . Heart disease Mother   . Anesthesia problems Neg Hx   . Hypotension Neg Hx   . Malignant hyperthermia Neg Hx   . Pseudochol deficiency Neg Hx   . Colon cancer Neg Hx   . Esophageal cancer Neg Hx   . Stomach cancer Neg Hx   . Rectal cancer Neg Hx     Surgical History Past Surgical History:  Procedure Laterality Date  . ABDOMINAL HYSTERECTOMY     partial hysterectomy  . APPENDECTOMY     as a child  . BOTOX INJECTION  12/03/2015   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  . CARDIAC CATHETERIZATION  11/21/10  . CATARACT EXTRACTION, BILATERAL    . CHOLECYSTECTOMY N/A 01/25/2016   Procedure: LAPAROSCOPIC CHOLECYSTECTOMY WITH INTRAOPERATIVE CHOLANGIOGRAM;  Surgeon: [name], MD;  Location: [facility];  Service: General;  Laterality: N/A;  . COLONOSCOPY    . COLOSTOMY RECONNECT    . colostomy reversal  06/27/11  . CORONARY ARTERY BYPASS GRAFT  11-25-2010   CABG X3; LIMA to LAD, SVG to PDA, SVG to OM, EVH via right thigh  . ESOPHAGOGASTRODUODENOSCOPY    . FLEXIBLE SIGMOIDOSCOPY  11/09/2011   Procedure: FLEXIBLE SIGMOIDOSCOPY;  Surgeon: Milus Banister, MD;  Location: [facility];  Service: Endoscopy;  Laterality: N/A;  . HEMORRHOID SURGERY  ~ 2005   x 2  . OOPHORECTOMY  2005   left; w/"mass removal"  . PROCTOSCOPY  06/27/2011   Procedure: PROCTOSCOPY;  Surgeon: Gayland Curry, MD;  Location: [facility];  Service: General;  Laterality: N/A;  . SIGMOIDECTOMY  12/18/2010   with end colostomy  . TUBAL LIGATION      Allergies  Allergen Reactions  . Nadolol Hives and Anaphylaxis    Rash and nausea  . Other Other (See Comments) and Anaphylaxis    apples Apples-anaphylaxis  . Sulfa Antibiotics Hives  . Sulfamethoxazole Anaphylaxis  . Sulfonamide Derivatives Anaphylaxis  . Verapamil Other (See Comments) and Anaphylaxis    seizures     Current Outpatient Medications  Medication Sig Dispense Refill  . acetaminophen (TYLENOL) 500 MG tablet Take 1,000 mg by mouth every 6 (six) hours as needed for mild pain.    . APRISO 0.375 g 24 hr capsule TAKE 2 CAPSULES BY MOUTH TWICE DAILY 100 capsule 2  . Ascorbic Acid (VITAMIN C) 1000 MG tablet Take 1,000 mg by mouth daily.    Marland Kitchen aspirin 81 MG tablet Take 81 mg by mouth daily.    . Bacillus Coagulans-Inulin (PROBIOTIC-PREBIOTIC) 1-250 BILLION-MG CAPS Take 1 capsule by mouth daily.     . Calcium Carb-Cholecalciferol (SM CALCIUM/VITAMIN D) 600-800 MG-UNIT TABS Take 1 tablet  by mouth daily.    . Coenzyme Q10 200 MG TABS Take 1 tablet by mouth daily.   0  . magnesium oxide (MAG-OX) 400 MG tablet Take 1 tablet (400 mg total) by mouth daily. 30 tablet 6  . mesalamine (APRISO) 0.375 g 24 hr capsule Take 2 capsules (0.75 g total) by mouth 2 (two) times daily. 72 capsule 0  . Multiple Vitamin (MULTIVITAMIN) tablet Take 1 tablet by mouth daily.     . Omega-3 350 MG CPDR Take 350 mg by mouth daily.    . pravastatin (PRAVACHOL) 80 MG tablet TAKE 1 TABLET BY MOUTH ONCE DAILY IN THE EVENING 90 tablet 2  . ramipril (ALTACE) 10 MG capsule TAKE 1 CAPSULE BY MOUTH ONCE DAILY 90 capsule 3   No current facility-administered medications for this visit.     Review of Systems : See HPI for pertinent positives and negatives.  Physical Examination  Vitals:   08/06/17 1517 08/06/17 1520  BP: 136/74 133/75  Pulse: (!) 59   Resp: 17   Temp: 98.3 F (36.8 C)   TempSrc: Oral   SpO2: 99%   Weight: 138 lb 3.2 oz (62.7 kg)   Height: 5' 6"  (1.676 m)    Body mass index is 22.31 kg/m.  General: WDWN female in NAD GAIT: normal HENT: No gross abnormalities  Eyes: PERRLA Pulmonary:  Respirations are non-labored, good air movement, CTAB, no rales, rhonchi, or wheezing. Cardiac: regular rhythm, no detected murmur.  VASCULAR EXAM Carotid Bruits Right Left   positive Negative     Abdominal aortic  pulse is not palpable. Radial pulses are 2+ palpable and equal.                                                                                                                                          LE Pulses Right Left       POPLITEAL  not palpable   not palpable       POSTERIOR TIBIAL  2+t palpable   2+ palpable        DORSALIS PEDIS      ANTERIOR TIBIAL 1+ palpable  2+ palpable     Gastrointestinal: soft, nontender, BS WNL, no r/g, no palpable masses. Musculoskeletal: No muscle atrophy/wasting. M/S 5/5 throughout, extremities without ischemic changes. Neurologic:  A&O X 3; appropriate affect, sensation is normal; speech is normal, CN 2-12 intact, pain and light touch intact in extremities, motor exam as listed above. Skin: No rashes, no ulcers, no cellulitis.   Psychiatric: Normal thought content, mood appropriate to clinical situation.    Assessment: [name] is a 77 y.o. female who has no hx of stroke or TIA.  Fortunately she does not have DM and has never used tobacco. She takes a daily ASA and a statin. She remains active. She has a normal BMI.   DATA Carotid Duplex (08/06/17): Right ICA:  60-79% stenosis Left ICA: 1-39% stenosis Right ECA with >50% stenosis Bilateral vertebral artery flow is antegrade.  Bilateral subclavian artery waveforms are normal.  No significant change compared to the exams on 06-19-16 and 02-01-17.    Plan: Follow-up in 6 months with Carotid Duplex scan.   I discussed in depth with the patient the nature of atherosclerosis, and emphasized the importance of maximal medical management including strict control of blood pressure, blood glucose, and lipid levels, obtaining regular exercise, and continued cessation of smoking.  The patient is aware that without maximal medical management the underlying atherosclerotic disease process will progress, limiting the benefit of any interventions. The patient was given information about  stroke prevention and what symptoms should prompt the patient to seek immediate medical care. Thank you for allowing [place] to participate in this patient's care.  Clemon Chambers, RN, MSN, FNP-C [facility] Office: [facility] Physician: Trula Slade  08/06/17 3:31 PM

## 2017-08-10 ENCOUNTER — Other Ambulatory Visit: Payer: Self-pay

## 2017-08-10 DIAGNOSIS — I6523 Occlusion and stenosis of bilateral carotid arteries: Secondary | ICD-10-CM

## 2017-08-21 ENCOUNTER — Other Ambulatory Visit: Payer: Self-pay | Admitting: Gastroenterology

## 2017-11-03 ENCOUNTER — Other Ambulatory Visit: Payer: Self-pay | Admitting: Gastroenterology

## 2017-11-26 DIAGNOSIS — S29011A Strain of muscle and tendon of front wall of thorax, initial encounter: Secondary | ICD-10-CM | POA: Diagnosis not present

## 2017-11-26 DIAGNOSIS — Z6822 Body mass index (BMI) 22.0-22.9, adult: Secondary | ICD-10-CM | POA: Diagnosis not present

## 2018-01-07 ENCOUNTER — Other Ambulatory Visit: Payer: Self-pay | Admitting: Gastroenterology

## 2018-01-07 MED ORDER — MESALAMINE ER 0.375 G PO CP24: 0.7500 g | ORAL_CAPSULE | Freq: Two times a day (BID) | ORAL | 2 refills | Status: DC

## 2018-01-07 NOTE — Telephone Encounter (Signed)
[name] #[id number] sent to [facility] as pt requested

## 2018-02-11 ENCOUNTER — Ambulatory Visit: Payer: PPO | Admitting: Family

## 2018-02-11 ENCOUNTER — Encounter (HOSPITAL_COMMUNITY): Payer: PPO

## 2018-02-25 ENCOUNTER — Ambulatory Visit: Payer: PPO | Admitting: Family

## 2018-02-25 ENCOUNTER — Encounter: Payer: Self-pay | Admitting: Family

## 2018-02-25 ENCOUNTER — Ambulatory Visit (HOSPITAL_COMMUNITY)
Admission: RE | Admit: 2018-02-25 | Discharge: 2018-02-25 | Disposition: A | Discharge status: Home / Self Care | Payer: PPO | Source: Ambulatory Visit | Attending: Family | Admitting: Family

## 2018-02-25 VITALS — BP 142/70 | HR 59 | Temp 97.7°F | Resp 16 | Ht 66.0 in | Wt 140.0 lb

## 2018-02-25 DIAGNOSIS — I6523 Occlusion and stenosis of bilateral carotid arteries: Secondary | ICD-10-CM | POA: Insufficient documentation

## 2018-02-25 NOTE — Progress Notes (Signed)
Chief Complaint: Follow up Extracranial Carotid Artery Stenosis   History of Present Illness  [name] is a 77 y.o. female whom Dr. Trula Slade has been monitoring for extracranial carotid artery stenosis. She had a duplex in 2012 which revealed moderate bilateral stenosis. This was repeated at [place] and she had greater than 70% right carotid stenosis. Peak systolic velocities were to 67 cm/s. Left-sided stenosis was less than 50%. The patient remained asymptomatic.  She denies any known history of stroke or TIA. Specificallyshe deniesa history of amaurosis fugax or monocular blindness, unilateral facial drooping, hemiplegia, orreceptive or expressive aphasia. Shehas nothad previous carotid artery intervention. She states that she does have occasional numbness in the fingers on her right hand, but this does go away. She denies any speech difficulties or amaurosis fugax.  She has a hx of CAD with STEMI in 2012 and underwent CABG. She developed Afib postoperatively, which was treated with amiodarone and coumadin. She did develop C diff colitis and had a perforated sigmoid colon requiring sigmoid colectomy and end colostomy. This was reversed in February 2013. She did go on to be diagnosed with ulcerative colitis.   Also, she has a hx of spasmodic dysphonia and underwent Botox injections at [facility] with improvement. She states that she needs to return for another injection. She states that it works well for about 3-6 months.  She was intubated for her CABG in 2012, a few days later was intubated again to address a perforated bowel.   In September2017, she had a laparoscopic cholecystectomy by Dr. [name].  Pt states her blood pressure at home remains good, 120's/70's.    Diabetic:no Tobacco use:non-smoker  Pt meds include: Statin :yes ASA:yes Other anticoagulants/antiplatelets:no   Past Medical History:  Diagnosis Date  . Anemia    "in my early teens"  . Arthritis    neck and back  . Atrial fibrillation ([place])    post-op after CABG;  treated with Amiodarone and coumadin until readmxn with CDiff colitis, perf colon and elevated LFTs (amio and coumadin stopped)  . C. difficile colitis 09/7844   complicated post CABG course with prolonged admission, perforated sigmoid colon;  s/p sigmoid colectomy with end colostomy [name] procedure);  laparotomy wound infection with Pseudomonas treated with antibxs and secondary intention  . Chronic kidney disease    hx kidney stones  . Coronary artery disease    a. NSTEMI 7/12, cath: pLAD 30%, mLAD 60-70%, dLAD 80%, oOM2 50%, mOM2 40%, trifurcation 80%, pRCA 25%, then occluded, L-R collats, EF 55%;   b. 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 Roxy Manns) // c. ETT 3/18: inf-lat ST depression of 1 mm at 4 min of exercise, frequent ectopy and hypertensive BP response // d. Myoview 4/18: EF 68, no ischemia; low risk   . 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 GERD (gastroesophageal reflux disease)   . H/O hiatal hernia    "slight; not having problems with it"  . Hair loss    after heart surgery  . Hemorrhoids    "none since hemorrhoid OR"  . History of bronchitis    late 1980's  . Hyperlipidemia    takes Pravastatin daily  . Hypertension    takes Metoprolol daily  . Mitral valve prolapse    was told this in the late 1990's; ;then with Dr.Mclean he states that she doesn't  . Myocardial infarction ([place]) 11/2010  . Palpitations 2003   hx of; Holter in 2003  with PACs and PVCs  . Pericarditis 1980   hx of  . 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 S/P colostomy ([place])    due to perf sigmoid colon in setting of CDiff colitis  . Seizures ([place])    "w/verapimil"  . Surgical wound infection    Pseudomonas; wound VAC; secondary intention  . 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 Smoking status: Never Smoker  . Smokeless  tobacco: Never Used  Substance Use Topics  . Alcohol use: No  . Drug use: No    Family History Family History  Problem Relation Age of Onset  . Heart attack Father 30  . Heart disease Father        massive heart attack  . Coronary artery disease Mother        [place]  . Heart disease Mother   . Anesthesia problems Neg Hx   . Hypotension Neg Hx   . Malignant hyperthermia Neg Hx   . Pseudochol deficiency Neg Hx   . Colon cancer Neg Hx   . Esophageal cancer Neg Hx   . Stomach cancer Neg Hx   . Rectal cancer Neg Hx     Surgical History Past Surgical History:  Procedure Laterality Date  . ABDOMINAL HYSTERECTOMY     partial hysterectomy  . APPENDECTOMY     as a child  . BOTOX INJECTION  12/03/2015   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  . CARDIAC CATHETERIZATION  11/21/10  . CATARACT EXTRACTION, BILATERAL    . CHOLECYSTECTOMY N/A 01/25/2016   Procedure: LAPAROSCOPIC CHOLECYSTECTOMY WITH INTRAOPERATIVE CHOLANGIOGRAM;  Surgeon: [name], MD;  [facility];  Service: General;  Laterality: N/A;  . COLONOSCOPY    . COLOSTOMY RECONNECT    . colostomy reversal  06/27/11  . CORONARY ARTERY BYPASS GRAFT  11-25-2010   CABG X3; LIMA to LAD, SVG to PDA, SVG to OM, EVH via right thigh  . ESOPHAGOGASTRODUODENOSCOPY    . FLEXIBLE SIGMOIDOSCOPY  11/09/2011   Procedure: FLEXIBLE SIGMOIDOSCOPY;  Surgeon: Milus Banister, MD;  [facility];  Service: Endoscopy;  Laterality: N/A;  . HEMORRHOID SURGERY  ~ 2005   x 2  . OOPHORECTOMY  2005   left; w/"mass removal"  . PROCTOSCOPY  06/27/2011   Procedure: PROCTOSCOPY;  Surgeon: Gayland Curry, MD;  Location: [facility];  Service: General;  Laterality: N/A;  . SIGMOIDECTOMY  12/18/2010   with end colostomy  . TUBAL LIGATION      Allergies  Allergen Reactions  . Nadolol Hives and Anaphylaxis    Rash and nausea  . Other Other (See Comments) and Anaphylaxis    apples Apples-anaphylaxis  . Sulfa  Antibiotics Hives  . Sulfamethoxazole Anaphylaxis  . Sulfonamide Derivatives Anaphylaxis  . Verapamil Other (See Comments) and Anaphylaxis    seizures    Current Outpatient Medications  Medication Sig Dispense Refill  . acetaminophen (TYLENOL) 500 MG tablet Take 1,000 mg by mouth every 6 (six) hours as needed for mild pain.    . Ascorbic Acid (VITAMIN C) 1000 MG tablet Take 1,000 mg by mouth daily.    Marland Kitchen aspirin 81 MG tablet Take 81 mg by mouth daily.    . Bacillus Coagulans-Inulin (PROBIOTIC-PREBIOTIC) 1-250 BILLION-MG CAPS Take 1 capsule by mouth daily.     . Calcium Carb-Cholecalciferol (SM CALCIUM/VITAMIN D) 600-800 MG-UNIT TABS Take 1 tablet by mouth daily.    Marland Kitchen  Coenzyme Q10 200 MG TABS Take 1 tablet by mouth daily.   0  . magnesium oxide (MAG-OX) 400 MG tablet Take 1 tablet (400 mg total) by mouth daily. 30 tablet 6  . mesalamine (APRISO) 0.375 g 24 hr capsule Take 2 capsules (0.75 g total) by mouth 2 (two) times daily. 120 capsule 2  . Multiple Vitamin (MULTIVITAMIN) tablet Take 1 tablet by mouth daily.     . Omega-3 350 MG CPDR Take 350 mg by mouth daily.    . pravastatin (PRAVACHOL) 80 MG tablet TAKE 1 TABLET BY MOUTH ONCE DAILY IN THE EVENING 90 tablet 2  . ramipril (ALTACE) 10 MG capsule TAKE 1 CAPSULE BY MOUTH ONCE DAILY 90 capsule 3   No current facility-administered medications for this visit.     Review of Systems : See HPI for pertinent positives and negatives.  Physical Examination  Vitals:   02/25/18 1123 02/25/18 1125  BP: (!) 143/66 (!) 142/70  Pulse: 62 (!) 59  Resp: 16   Temp: 97.7 F (36.5 C)   TempSrc: Oral   SpO2: 100% 100%  Weight: 140 lb (63.5 kg)   Height: 5' 6"  (1.676 m)    Body mass index is 22.6 kg/m.  General: WDWNfemalein NAD GAIT:normal HENT: No gross abnormalities  Eyes: PERRLA Pulmonary: Respirations are non-labored, good air movement, CTAB, no rales, rhonchi,orwheezing. Cardiac:regularrhythm, nodetected murmur.  VASCULAR  EXAM Carotid Bruits Right Left   positive Negative   Abdominal aortic pulse isnotpalpable. Radial pulses are2+palpable and equal.   LE Pulses Right Left  POPLITEAL notpalpable  notpalpable  POSTERIOR TIBIAL 2+tpalpable  2+palpable   DORSALIS PEDIS ANTERIOR TIBIAL 1+palpable  2+palpable     Gastrointestinal:soft, nontender, BS WNL, no r/g,nopalpable masses. Musculoskeletal:Nomuscle atrophy/wasting. M/S 5/5 throughout, extremities without ischemic changes. Neurologic: A&O X 3; appropriate affect, sensation is normal; speech is normal, CN 2-12 intact, pain and light touch intact in extremities, motor exam as listed above. Skin: No rashes, no ulcers, no cellulitis.   Psychiatric: Normal thought content, mood appropriate to clinical situation    Assessment: [name] is a 77 y.o. female who who has no hx of stroke or TIA.  Fortunately she does not have DM and has never used tobacco. She takes a daily ASA and a statin. She remains active. She has a normal BMI.  DATA Carotid Duplex (08/06/17): Right ICA: 60-79% stenosis Left ICA: 1-39% stenosis Bilateral vertebral artery flow is antegrade.  Bilateral subclavian artery waveforms are normal.  No significant change compared to the exams on 06-19-16, 02-01-17, and 08-06-17   Plan: Follow-up in91monthwith Carotid Duplex scan.  I discussed in depth with the patient the nature of atherosclerosis, and emphasized the importance of maximal medical management including strict control of blood pressure, blood glucose, and lipid levels, obtaining regular exercise, and continued cessation of smoking.  The patient is aware that without maximal medical management the underlying atherosclerotic disease process will progress, limiting the benefit of any interventions. The patient was given information about stroke prevention and what symptoms should prompt  the patient to seek immediate medical care. Thank you for allowing [place] participate in this patient's care.  SClemon Chambers RN, MSN, FNP-C [facility] [phone] [facility] Physician: FOneida Alaron call  02/25/18 11:59 AM

## 2018-02-25 NOTE — Patient Instructions (Signed)

## 2018-03-12 ENCOUNTER — Telehealth: Payer: Self-pay | Admitting: Gastroenterology

## 2018-03-12 NOTE — Telephone Encounter (Signed)
Spoke to patient. She will come by the office to pick up Apriso samples. Patient states she will be in the :donut hole" until December

## 2018-03-14 DIAGNOSIS — Z23 Encounter for immunization: Secondary | ICD-10-CM | POA: Diagnosis not present

## 2018-03-20 ENCOUNTER — Other Ambulatory Visit: Payer: Self-pay | Admitting: Internal Medicine

## 2018-03-20 NOTE — Telephone Encounter (Signed)
Please review for refill. Thanks!  

## 2018-04-08 ENCOUNTER — Telehealth: Payer: Self-pay

## 2018-04-08 NOTE — Telephone Encounter (Signed)
The pt has had diarrhea and abd pain and cramping as well as urgency since last Tuesday.  She has 6-10 episodes daily.  Has tried imodium and pepto but that only helps for a few hours.  She has a history of UC and is taking Apriso.  She has not been on any antibiotics or around any sick contacts.  Appt made for 11/26 at 130 pm with Alonza Bogus, PA

## 2018-04-09 ENCOUNTER — Ambulatory Visit: Payer: PPO | Admitting: Gastroenterology

## 2018-04-17 ENCOUNTER — Other Ambulatory Visit (INDEPENDENT_AMBULATORY_CARE_PROVIDER_SITE_OTHER): Payer: PPO

## 2018-04-17 ENCOUNTER — Ambulatory Visit: Payer: PPO | Admitting: Nurse Practitioner

## 2018-04-17 ENCOUNTER — Encounter: Payer: Self-pay | Admitting: Nurse Practitioner

## 2018-04-17 VITALS — BP 144/72 | HR 86 | Ht 66.0 in | Wt 139.0 lb

## 2018-04-17 DIAGNOSIS — K51919 Ulcerative colitis, unspecified with unspecified complications: Secondary | ICD-10-CM

## 2018-04-17 DIAGNOSIS — R197 Diarrhea, unspecified: Secondary | ICD-10-CM

## 2018-04-17 LAB — CBC
HCT: 37.9 % (ref 36.0–46.0)
Hemoglobin: 12.5 g/dL (ref 12.0–15.0)
MCHC: 33 g/dL (ref 30.0–36.0)
MCV: 86 fl (ref 78.0–100.0)
PLATELETS: 135 10*3/uL — AB (ref 150.0–400.0)
RBC: 4.4 Mil/uL (ref 3.87–5.11)
RDW: 14.5 % (ref 11.5–15.5)
WBC: 3.3 10*3/uL — ABNORMAL LOW (ref 4.0–10.5)

## 2018-04-17 LAB — BASIC METABOLIC PANEL
BUN: 20 mg/dL (ref 6–23)
CALCIUM: 9.5 mg/dL (ref 8.4–10.5)
CO2: 31 mEq/L (ref 19–32)
CREATININE: 0.92 mg/dL (ref 0.40–1.20)
Chloride: 103 mEq/L (ref 96–112)
GFR: 63.11 mL/min (ref >60.00)
GLUCOSE: 121 mg/dL — AB (ref 70–99)
Potassium: 4.1 mEq/L (ref 3.5–5.1)
SODIUM: 140 meq/L (ref 135–145)

## 2018-04-17 LAB — HIGH SENSITIVITY CRP: CRP HIGH SENSITIVITY: 6.02 mg/L — AB (ref 0.000–5.000)

## 2018-04-17 LAB — SEDIMENTATION RATE: SED RATE: 30 mm/h (ref 0–30)

## 2018-04-17 NOTE — Progress Notes (Signed)
Chief Complaint:    diarrhea  IMPRESSION and PLAN:     77 year old female with chronic colitis on colonoscopy in 2014.  She has done well on mesalamine for the last couple of years but now with 2-week history of crampy, nonbloody diarrhea with increased gas.  She did have amoxicillin the end of October.  Patient has a history of C. difficile which actually resulted in a perforation/sigmoid colectomy years ago.  -Certainly need to check her stools for C. Difficile -labs today including cmet, cbc, esr, crp -I encouraged her to get a sample back ASAP.  Hopefully this can be resulted before the weekend.  If C. difficile study negative then consider course of Flagyl versus she could retry a course of prednisone but at a lower dose than before.      HPI:     Patient is a 77 yo female with hx of c-diff resulting in perforation / segmental colectomy in 6803 this was complicated by anastomotic strictures requiring requiring dilation. For evaluation of diarrhea n 2014 she underwent colonoscopy with biopsies revealing chronic inflammation throughout the colon.  There were no crypt abscesses or granulomas. She was treated with prednisone (stopped due to hot flashed and racing heart). Changed to Uceris but never started due to cost. In Sept 2014 she was started on Apriso with a good response. Patient was seen in office summer of 2017 for LLQ pain and loose stools.  C. difficile was negative, labs were normal.  She was given a course of Flagyl, symptoms resolved.   Patient comes in today for 2-week history of crampy, nonbloody diarrhea and increased gas.  She is having 5-7 loose stools a day whereas her normal is to solid stools a day.  She did take amoxicillin the end of October for dental purposes.  She is not had any associated nausea, vomiting, or fever.  Last night she woke up twice with diarrhea. She has been taking Apriso as directed.  No recent dietary or medication changes.  She does take  magnesium but has been on that for at least a couple of years.   Review of systems:     No chest pain, no SOB, no fevers, no urinary sx   Past Medical History:  Diagnosis Date  . Anemia    "in my early teens"  . Arthritis    neck and back  . Atrial fibrillation ([place])    post-op after CABG;  treated with Amiodarone and coumadin until readmxn with CDiff colitis, perf colon and elevated LFTs (amio and coumadin stopped)  . C. difficile colitis 06/1222   complicated post CABG course with prolonged admission, perforated sigmoid colon;  s/p sigmoid colectomy with end colostomy [name] procedure);  laparotomy wound infection with Pseudomonas treated with antibxs and secondary intention  . Chronic kidney disease    hx kidney stones  . Coronary artery disease    a. NSTEMI 7/12, cath: pLAD 30%, mLAD 60-70%, dLAD 80%, oOM2 50%, mOM2 40%, trifurcation 80%, pRCA 25%, then occluded, L-R collats, EF 55%;   b. 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 Roxy Manns) // c. ETT 3/18: inf-lat ST depression of 1 mm at 4 min of exercise, frequent ectopy and hypertensive BP response // d. Myoview 4/18: EF 68, no ischemia; low risk   . 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 GERD (gastroesophageal reflux disease)   . H/O hiatal hernia    "slight; not having problems  with it"  . Hair loss    after heart surgery  . Hemorrhoids    "none since hemorrhoid OR"  . History of bronchitis    late 1980's  . Hyperlipidemia    takes Pravastatin daily  . Hypertension    takes Metoprolol daily  . Mitral valve prolapse    was told this in the late 1990's; ;then with Dr.Mclean he states that she doesn't  . Myocardial infarction ([place]) 11/2010  . Palpitations 2003   hx of; Holter in 2003 with PACs and PVCs  . Pericarditis 1980   hx of  . 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 S/P colostomy ([place])    due to perf sigmoid colon in setting of CDiff colitis  . Seizures ([place])    "w/verapimil"  . Surgical  wound infection    Pseudomonas; wound VAC; secondary intention  . 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 acetaminophen (TYLENOL) 500 MG tablet Take 1,000 mg by mouth every 6 (six) hours as needed for mild pain.    . Ascorbic Acid (VITAMIN C) 1000 MG tablet Take 1,000 mg by mouth daily.    Marland Kitchen aspirin 81 MG tablet Take 81 mg by mouth daily.    . Bacillus Coagulans-Inulin (PROBIOTIC-PREBIOTIC) 1-250 BILLION-MG CAPS Take 1 capsule by mouth daily.     . Calcium Carb-Cholecalciferol (SM CALCIUM/VITAMIN D) 600-800 MG-UNIT TABS Take 1 tablet by mouth daily.    . Coenzyme Q10 200 MG TABS Take 1 tablet by mouth daily.   0  . magnesium oxide (MAG-OX) 400 MG tablet Take 1 tablet (400 mg total) by mouth daily. 30 tablet 6  . mesalamine (APRISO) 0.375 g 24 hr capsule Take 2 capsules (0.75 g total) by mouth 2 (two) times daily. 120 capsule 2  . Multiple Vitamin (MULTIVITAMIN) tablet Take 1 tablet by mouth daily.     . Omega-3 350 MG CPDR Take 350 mg by mouth daily.    . pravastatin (PRAVACHOL) 80 MG tablet TAKE 1 TABLET BY MOUTH ONCE DAILY IN THE EVENING 90 tablet 0  . ramipril (ALTACE) 10 MG capsule TAKE 1 CAPSULE BY MOUTH ONCE DAILY 90 capsule 3   No current facility-administered medications for this visit.     Physical Exam:     BP (!) 144/72   Pulse 86   Ht _0  (1.676 m)   Wt 139 lb (63 kg)   SpO2 99%   BMI 22.44 kg/m   GENERAL:  Pleasant female in NAD PSYCH: : Cooperative, normal affect EENT:  conjunctiva pink, mucous membranes moist, neck supple without masses CARDIAC:  RRR, no murmur heard, no peripheral edema PULM: Normal respiratory effort, lungs CTA bilaterally, no wheezing ABDOMEN:   Nondistended, soft, mild diffuse lower abdominal tenderness. No obvious masses, no hepatomegaly,  normal bowel sounds SKIN:  turgor, no lesions seen Musculoskeletal:  Normal muscle tone, normal strength NEURO: Alert and oriented x 3, no focal neurologic deficits   Tye Savoy , NP 04/17/2018, 2:57 PM

## 2018-04-17 NOTE — Patient Instructions (Signed)
If you are age 75 or older, your body mass index should be between 23-30. Your Body mass index is 22.44 kg/m. If this is out of the aforementioned range listed, please consider follow up with your Primary Care Provider.  If you are age 79 or younger, your body mass index should be between 19-25. Your Body mass index is 22.44 kg/m. If this is out of the aformentioned range listed, please consider follow up with your Primary Care Provider.   Your provider has requested that you go to the basement level for lab work before leaving today. Press "B" on the elevator. The lab is located at the first door on the left as you exit the elevator.  We will call you with results.  Thank you for choosing me and [facility].   Tye Savoy, NP

## 2018-04-18 ENCOUNTER — Other Ambulatory Visit: Payer: PPO

## 2018-04-18 DIAGNOSIS — R197 Diarrhea, unspecified: Secondary | ICD-10-CM

## 2018-04-18 LAB — C.DIFFICILE TOXIN: C. Difficile Toxin A: NOT DETECTED

## 2018-04-19 ENCOUNTER — Encounter: Payer: Self-pay | Admitting: Nurse Practitioner

## 2018-04-19 ENCOUNTER — Other Ambulatory Visit: Payer: Self-pay

## 2018-04-19 ENCOUNTER — Telehealth: Payer: Self-pay | Admitting: Nurse Practitioner

## 2018-04-19 MED ORDER — METRONIDAZOLE 500 MG PO TABS: 500.0000 mg | ORAL_TABLET | Freq: Three times a day (TID) | ORAL | 0 refills | Status: AC

## 2018-04-19 NOTE — Telephone Encounter (Signed)
done

## 2018-04-22 ENCOUNTER — Telehealth: Payer: Self-pay | Admitting: Nurse Practitioner

## 2018-04-22 NOTE — Telephone Encounter (Signed)
Patient did start Flagyl for her diarrhea. Her symptoms return Saturday. She is tolerating the taste aversion and careful with taking medication with meals. No questions at this time. She will call back if she fails to improve.

## 2018-04-22 NOTE — Telephone Encounter (Signed)
Patient states that she is returning Beth's call from [place]

## 2018-04-22 NOTE — Progress Notes (Signed)
I agree with the above note, plan 

## 2018-04-29 ENCOUNTER — Telehealth: Payer: Self-pay | Admitting: Gastroenterology

## 2018-04-29 NOTE — Telephone Encounter (Signed)
Patient requesting refills for Apriso sent to [facility] in [place]

## 2018-04-30 ENCOUNTER — Other Ambulatory Visit: Payer: Self-pay | Admitting: Gastroenterology

## 2018-04-30 ENCOUNTER — Telehealth: Payer: Self-pay

## 2018-04-30 ENCOUNTER — Telehealth: Payer: Self-pay | Admitting: Gastroenterology

## 2018-04-30 MED ORDER — MESALAMINE ER 0.375 G PO CP24: 0.7500 g | ORAL_CAPSULE | Freq: Two times a day (BID) | ORAL | 2 refills | Status: DC

## 2018-04-30 NOTE — Telephone Encounter (Signed)
Apriso sent to pharmacy. Patient notified

## 2018-04-30 NOTE — Telephone Encounter (Signed)
Pt aware and will take the benadryl and call us back tomorrow. Flagyl added to allergy list.

## 2018-04-30 NOTE — Telephone Encounter (Signed)
Pt states her pharmacy has sent several requests for refills of her apriso. Pt states the refill has not been sent in for her.

## 2018-04-30 NOTE — Telephone Encounter (Signed)
Pt states she just took 7 days worth of flagyl and finished it on Friday. States this am she is broken out on her thighs, buttocks, her ear, eye and neck with red raised whelps that itch. Pt states she has not tried any new soaps or detergents. Please advise.

## 2018-04-30 NOTE — Telephone Encounter (Signed)
Pt called in and advised that she is breaking out in a red rash almost like welts on her skin.

## 2018-04-30 NOTE — Telephone Encounter (Signed)
This might be from the Flagyl.  She should not resume it and please put Flagyl on her allergy list.  Please advise her to take a 25 mg Benadryl right now and repeat later on in the day if still needed.  She should call tomorrow to update on her progress.

## 2018-05-06 DIAGNOSIS — J329 Chronic sinusitis, unspecified: Secondary | ICD-10-CM | POA: Diagnosis not present

## 2018-05-06 DIAGNOSIS — J40 Bronchitis, not specified as acute or chronic: Secondary | ICD-10-CM | POA: Diagnosis not present

## 2018-06-05 ENCOUNTER — Other Ambulatory Visit: Payer: Self-pay | Admitting: Internal Medicine

## 2018-06-05 NOTE — Telephone Encounter (Signed)
Refill Request.  

## 2018-06-05 NOTE — Telephone Encounter (Signed)
Pt sees Dr. Martinique now, pt has an upcoming appt.

## 2018-06-13 ENCOUNTER — Encounter: Payer: Self-pay | Admitting: Cardiology

## 2018-07-01 DIAGNOSIS — I10 Essential (primary) hypertension: Secondary | ICD-10-CM | POA: Diagnosis not present

## 2018-07-01 DIAGNOSIS — I259 Chronic ischemic heart disease, unspecified: Secondary | ICD-10-CM | POA: Diagnosis not present

## 2018-07-01 DIAGNOSIS — K51919 Ulcerative colitis, unspecified with unspecified complications: Secondary | ICD-10-CM | POA: Diagnosis not present

## 2018-07-01 DIAGNOSIS — Z6822 Body mass index (BMI) 22.0-22.9, adult: Secondary | ICD-10-CM | POA: Diagnosis not present

## 2018-07-01 DIAGNOSIS — Z9181 History of falling: Secondary | ICD-10-CM | POA: Diagnosis not present

## 2018-07-01 DIAGNOSIS — Z1331 Encounter for screening for depression: Secondary | ICD-10-CM | POA: Diagnosis not present

## 2018-07-01 DIAGNOSIS — M1990 Unspecified osteoarthritis, unspecified site: Secondary | ICD-10-CM | POA: Diagnosis not present

## 2018-07-01 DIAGNOSIS — E7800 Pure hypercholesterolemia, unspecified: Secondary | ICD-10-CM | POA: Diagnosis not present

## 2018-07-01 DIAGNOSIS — Z0000 Encounter for general adult medical examination without abnormal findings: Secondary | ICD-10-CM | POA: Diagnosis not present

## 2018-07-02 DIAGNOSIS — Z79899 Other long term (current) drug therapy: Secondary | ICD-10-CM | POA: Diagnosis not present

## 2018-07-02 DIAGNOSIS — I10 Essential (primary) hypertension: Secondary | ICD-10-CM | POA: Diagnosis not present

## 2018-07-02 DIAGNOSIS — E785 Hyperlipidemia, unspecified: Secondary | ICD-10-CM | POA: Diagnosis not present

## 2018-07-07 NOTE — Progress Notes (Signed)
Cardiology Office Note   Date:  07/08/2018   ID:  Dominique, Terry [birth year], MRN [id number]  PCP:  Angelina Sheriff, MD  Cardiologist:    Martinique, MD   Chief Complaint  Patient presents with  . Shortness of Breath    shortness of breath when walking and going up hill notice about 1 month ago,,      History of Present Illness: [name] is a 78 y.o. female who presents for follow up CAD. She has a  history of coronary artery disease with NSTEMI and CABG in 11/2010 by Dr. Roxy Manns ( LIMA to the LAD, SVG to OM, SVG to PDA), post-op atrial fibrillation, spasmatic dysphonia, hypertension, and hyperlipidemia. Previously followed by Dr. Saunders Revel. She has chronic dyspnea on exertion. She had a CPX in 2017 showing no cardiac or ventilatory limitation. Regular ETT in March 2018 with hypertensive BP response. lexiscan myoview  in 08/2016 was low risk without ischemia or scar. She also has carotid arterial disease - moderate on the right.   On follow up today she reports increased SOB on exertion such as going up hill or up stairs in the last month. Notes her presenting symptoms prior to CABG were of dyspnea only it was much worse then. Some cough. Notes palpitations with exertion and on prior CPX was noted to have PVCs. No chest pain. Reports intolerance to Crestor in past. Has a history of colitis.    Past Medical History:  Diagnosis Date  . Anemia    "in my early teens"  . Arthritis    neck and back  . Atrial fibrillation ([place])    post-op after CABG;  treated with Amiodarone and coumadin until readmxn with CDiff colitis, perf colon and elevated LFTs (amio and coumadin stopped)  . C. difficile colitis 09/1882   complicated post CABG course with prolonged admission, perforated sigmoid colon;  s/p sigmoid colectomy with end colostomy [name] procedure);  laparotomy wound infection with Pseudomonas treated with antibxs and secondary intention  . Chronic kidney disease    hx  kidney stones  . Coronary artery disease    a. NSTEMI 7/12, cath: pLAD 30%, mLAD 60-70%, dLAD 80%, oOM2 50%, mOM2 40%, trifurcation 80%, pRCA 25%, then occluded, L-R collats, EF 55%;   b. 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 Roxy Manns) // c. ETT 3/18: inf-lat ST depression of 1 mm at 4 min of exercise, frequent ectopy and hypertensive BP response // d. Myoview 4/18: EF 68, no ischemia; low risk   . 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 GERD (gastroesophageal reflux disease)   . H/O hiatal hernia    "slight; not having problems with it"  . Hair loss    after heart surgery  . Hemorrhoids    "none since hemorrhoid OR"  . History of bronchitis    late 1980's  . Hyperlipidemia    takes Pravastatin daily  . Hypertension    takes Metoprolol daily  . Mitral valve prolapse    was told this in the late 1990's; ;then with Dr.Mclean he states that she doesn't  . Myocardial infarction ([place]) 11/2010  . Palpitations 2003   hx of; Holter in 2003 with PACs and PVCs  . Pericarditis 1980   hx of  . 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 S/P colostomy ([place])    due to perf sigmoid colon in setting of CDiff colitis  . Seizures (  [place])    "w/verapimil"  . Surgical wound infection    Pseudomonas; wound VAC; secondary intention  . 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 ABDOMINAL HYSTERECTOMY     partial hysterectomy  . APPENDECTOMY     as a child  . BOTOX INJECTION  12/03/2015   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  . CARDIAC CATHETERIZATION  11/21/10  . CATARACT EXTRACTION, BILATERAL    . CHOLECYSTECTOMY N/A 01/25/2016   Procedure: LAPAROSCOPIC CHOLECYSTECTOMY WITH INTRAOPERATIVE CHOLANGIOGRAM;  Surgeon: [name], MD;  Location: [facility];  Service: General;  Laterality: N/A;  . COLONOSCOPY    . COLOSTOMY RECONNECT    . colostomy reversal  06/27/11  . CORONARY ARTERY BYPASS GRAFT  11-25-2010   CABG X3;  LIMA to LAD, SVG to PDA, SVG to OM, EVH via right thigh  . ESOPHAGOGASTRODUODENOSCOPY    . FLEXIBLE SIGMOIDOSCOPY  11/09/2011   Procedure: FLEXIBLE SIGMOIDOSCOPY;  Surgeon: Milus Banister, MD;  Location: [facility];  Service: Endoscopy;  Laterality: N/A;  . HEMORRHOID SURGERY  ~ 2005   x 2  . OOPHORECTOMY  2005   left; w/"mass removal"  . PROCTOSCOPY  06/27/2011   Procedure: PROCTOSCOPY;  Surgeon: Gayland Curry, MD;  Location: [facility];  Service: General;  Laterality: N/A;  . SIGMOIDECTOMY  12/18/2010   with end colostomy  . TUBAL LIGATION       Current Outpatient Medications  Medication Sig Dispense Refill  . acetaminophen (TYLENOL) 500 MG tablet Take 1,000 mg by mouth every 6 (six) hours as needed for mild pain.    Marland Kitchen aspirin 81 MG tablet Take 81 mg by mouth daily.    . Bacillus Coagulans-Inulin (PROBIOTIC-PREBIOTIC) 1-250 BILLION-MG CAPS Take 1 capsule by mouth daily.     . Calcium Carb-Cholecalciferol (SM CALCIUM/VITAMIN D) 600-800 MG-UNIT TABS Take 1 tablet by mouth daily.    . Coenzyme Q10 200 MG TABS Take 1 tablet by mouth daily.   0  . magnesium oxide (MAG-OX) 400 MG tablet Take 1 tablet (400 mg total) by mouth daily. 30 tablet 6  . mesalamine (APRISO) 0.375 g 24 hr capsule Take 2 capsules (0.75 g total) by mouth 2 (two) times daily. 120 capsule 2  . Multiple Vitamin (MULTIVITAMIN) tablet Take 1 tablet by mouth daily.     . Omega-3 350 MG CPDR Take 350 mg by mouth daily.    . pravastatin (PRAVACHOL) 80 MG tablet TAKE 1 TABLET BY MOUTH ONCE DAILY IN THE EVENING 90 tablet 0  . losartan (COZAAR) 50 MG tablet Take 1 tablet (50 mg total) by mouth daily. 90 tablet 3   No current facility-administered medications for this visit.     Allergies:   Nadolol; Other; Sulfa antibiotics; Sulfamethoxazole; Sulfonamide derivatives; Verapamil; and Flagyl [metronidazole]    Social History:  The patient  reports that she has never smoked. She has never used smokeless tobacco. She reports that she  does not drink alcohol or use drugs.   Family History:  The patient's family history includes Coronary artery disease in her mother; Heart attack (age of onset: 81) in her father; Heart disease in her father and mother.    ROS:  Please see the history of present illness.   Otherwise, review of systems are positive for none.   All other systems are reviewed and negative.    PHYSICAL EXAM: VS:  BP 122/70 (BP Location: Left Arm)   Pulse Marland Kitchen)  59   Ht 5' 6"  (1.676 m)   Wt 137 lb 6.4 oz (62.3 kg)   BMI 22.18 kg/m  , BMI Body mass index is 22.18 kg/m. GEN: Well nourished, well developed, in no acute distress  HEENT: normal  Neck: no JVD, carotid bruits, or masses Cardiac: RRR; no murmurs, rubs, or gallops,no edema  Respiratory:  clear to auscultation bilaterally, normal work of breathing GI: soft, nontender, nondistended, + BS MS: no deformity or atrophy  Skin: warm and dry, no rash Neuro:  Strength and sensation are intact Psych: euthymic mood, full affect   EKG:  EKG is ordered today. The ekg ordered today demonstrates NSR with limb lead reversal. Low voltage. Suggest anterolateral infarct. I have personally reviewed and interpreted this study.    Recent Labs: 04/17/2018: BUN 20; Creatinine, Ser 0.92; Hemoglobin 12.5; Platelets 135.0; Potassium 4.1; Sodium 140    Lipid Panel    Component Value Date/Time   CHOL 169 08/04/2016 0926   TRIG 93 08/04/2016 0926   HDL 64 08/04/2016 0926   CHOLHDL 2.6 08/04/2016 0926   CHOLHDL 3 04/13/2014 0934   VLDL 24.6 04/13/2014 0934   LDLCALC 86 08/04/2016 0926   LDLDIRECT 81.8 07/20/2011 1504    Labs dated 07/02/18: cholesterol 161, triglycerides 81, HDL 52, LDL 93.  chemistries and TSH normal. WBC low at 2.9.  Wt Readings from Last 3 Encounters:  07/08/18 137 lb 6.4 oz (62.3 kg)  04/17/18 139 lb (63 kg)  02/25/18 140 lb (63.5 kg)      Other studies Reviewed: Additional studies/ records that were reviewed today include:  Myoview  08/29/16: Study Highlights     Nuclear stress EF: 68%. Normal wall motion. Nonspecific ST-T wave changes on ECG.  There was no ST segment deviation noted during stress.  This is a low risk study. No perfusion defects, no ischemia identified.   Candee Furbish, MD   Carotid dopplers 02/25/18: Summary: Right Carotid: Velocities in the right ICA are consistent with a 60-79%                stenosis.  Left Carotid: Velocities in the left ICA are consistent with a 1-39% stenosis.  Vertebrals:  Bilateral vertebral arteries demonstrate antegrade flow. Subclavians: Normal flow hemodynamics were seen in bilateral subclavian              arteries.   ASSESSMENT AND PLAN:  1.  CAD s/p CABG x 3 in 2012 for multivessel CAD. Normal Myoview in 2018. Notes increased DOE in last month. Concerning since this was her presenting anginal equivalent. Will arrange for a Lexiscan Myoview.   2. Hypercholesterolemia. LDL is not at goal of < 70. Unable to tolerate more potent statins. Does not want to take Zetia due to potential for bowel symptoms. May need to consider PCSK 9 inhibitor. Will address on future visit.   3. Carotid arterial disease. 60-79% RICA stenosis.   4. HTN controlled.   5. Dyspnea. Extensive evaluation in the past. I wonder if Altace may be playing a role with cough and upper airway symptoms. Will switch to losartan 50 mg daily. Will check Echo. Low voltage on Ecg could be a sign of amyloid. Will await Echo results.    Current medicines are reviewed at length with the patient today.  The patient does not have concerns regarding medicines.  The following changes have been made:  Switch Altace to losartan 50 mg daily   Labs/ tests ordered today include:   Orders Placed  This Encounter  Procedures  . Myocardial Perfusion Imaging  . EKG 12-Lead  . ECHOCARDIOGRAM COMPLETE     Disposition:   FU with me in 3 months.   Signed,  Martinique, MD  07/08/2018 12:25 PM    [facility] [address], [place], [place], [address] Phone [phone], Fax [phone]

## 2018-07-08 ENCOUNTER — Encounter: Payer: Self-pay | Admitting: Cardiology

## 2018-07-08 ENCOUNTER — Ambulatory Visit: Payer: PPO | Admitting: Cardiology

## 2018-07-08 VITALS — BP 122/70 | HR 59 | Ht 66.0 in | Wt 137.4 lb

## 2018-07-08 DIAGNOSIS — E785 Hyperlipidemia, unspecified: Secondary | ICD-10-CM

## 2018-07-08 DIAGNOSIS — I2510 Atherosclerotic heart disease of native coronary artery without angina pectoris: Secondary | ICD-10-CM

## 2018-07-08 DIAGNOSIS — I10 Essential (primary) hypertension: Secondary | ICD-10-CM

## 2018-07-08 DIAGNOSIS — R0609 Other forms of dyspnea: Secondary | ICD-10-CM

## 2018-07-08 DIAGNOSIS — I6523 Occlusion and stenosis of bilateral carotid arteries: Secondary | ICD-10-CM | POA: Diagnosis not present

## 2018-07-08 MED ORDER — LOSARTAN POTASSIUM 50 MG PO TABS: 50.0000 mg | ORAL_TABLET | Freq: Every day | ORAL | 3 refills | Status: DC

## 2018-07-08 NOTE — Patient Instructions (Addendum)
We will switch ramipril to losartan 50 mg daily  We will schedule you for an Echocardiogram and a Nuclear stress test [employer]

## 2018-07-17 ENCOUNTER — Ambulatory Visit (HOSPITAL_COMMUNITY): Payer: PPO | Attending: Cardiology

## 2018-07-17 ENCOUNTER — Telehealth (HOSPITAL_COMMUNITY): Payer: Self-pay

## 2018-07-17 DIAGNOSIS — I6523 Occlusion and stenosis of bilateral carotid arteries: Secondary | ICD-10-CM | POA: Diagnosis not present

## 2018-07-17 DIAGNOSIS — I10 Essential (primary) hypertension: Secondary | ICD-10-CM | POA: Insufficient documentation

## 2018-07-17 DIAGNOSIS — E785 Hyperlipidemia, unspecified: Secondary | ICD-10-CM | POA: Diagnosis not present

## 2018-07-17 DIAGNOSIS — R0609 Other forms of dyspnea: Secondary | ICD-10-CM | POA: Insufficient documentation

## 2018-07-17 DIAGNOSIS — I2510 Atherosclerotic heart disease of native coronary artery without angina pectoris: Secondary | ICD-10-CM

## 2018-07-17 NOTE — Telephone Encounter (Signed)
Encounter complete. 

## 2018-07-18 ENCOUNTER — Telehealth (HOSPITAL_COMMUNITY): Payer: Self-pay

## 2018-07-18 NOTE — Telephone Encounter (Signed)
Encounter complete. 

## 2018-07-19 ENCOUNTER — Telehealth: Payer: Self-pay | Admitting: Cardiology

## 2018-07-19 ENCOUNTER — Ambulatory Visit (HOSPITAL_COMMUNITY)
Admission: RE | Admit: 2018-07-19 | Discharge: 2018-07-19 | Disposition: A | Discharge status: Home / Self Care | Payer: PPO | Source: Ambulatory Visit | Attending: Cardiovascular Disease | Admitting: Cardiovascular Disease

## 2018-07-19 DIAGNOSIS — E785 Hyperlipidemia, unspecified: Secondary | ICD-10-CM | POA: Diagnosis not present

## 2018-07-19 DIAGNOSIS — I6523 Occlusion and stenosis of bilateral carotid arteries: Secondary | ICD-10-CM | POA: Diagnosis not present

## 2018-07-19 DIAGNOSIS — I2510 Atherosclerotic heart disease of native coronary artery without angina pectoris: Secondary | ICD-10-CM

## 2018-07-19 DIAGNOSIS — R0609 Other forms of dyspnea: Secondary | ICD-10-CM

## 2018-07-19 DIAGNOSIS — I10 Essential (primary) hypertension: Secondary | ICD-10-CM

## 2018-07-19 LAB — MYOCARDIAL PERFUSION IMAGING
CHL CUP NUCLEAR SRS: 0
CHL CUP NUCLEAR SSS: 0
CSEPPHR: 103 {beats}/min
LV dias vol: 80 mL (ref 46–106)
LV sys vol: 27 mL
Rest HR: 63 {beats}/min
SDS: 0
TID: 0.95

## 2018-07-19 MED ORDER — AMINOPHYLLINE 25 MG/ML IV SOLN
75.0000 mg | Freq: Once | INTRAVENOUS | Status: AC
  Administered 2018-07-19: 75 mg via INTRAVENOUS

## 2018-07-19 MED ORDER — TECHNETIUM TC 99M TETROFOSMIN IV KIT
31.9000 | PACK | Freq: Once | INTRAVENOUS | Status: AC | PRN
  Administered 2018-07-19: 31.9 via INTRAVENOUS
  Filled 2018-07-19: qty 32

## 2018-07-19 MED ORDER — TECHNETIUM TC 99M TETROFOSMIN IV KIT
10.7000 | PACK | Freq: Once | INTRAVENOUS | Status: AC | PRN
  Administered 2018-07-19: 10.7 via INTRAVENOUS
  Filled 2018-07-19: qty 11

## 2018-07-19 MED ORDER — REGADENOSON 0.4 MG/5ML IV SOLN
0.4000 mg | Freq: Once | INTRAVENOUS | Status: AC
  Administered 2018-07-19: 0.4 mg via INTRAVENOUS

## 2018-07-19 NOTE — Telephone Encounter (Signed)
Follow Up:; ° ° °Returning your call. °

## 2018-07-19 NOTE — Telephone Encounter (Signed)
New Message   Pt is calling back to speak with Malachy Mood Please call back

## 2018-07-22 NOTE — Telephone Encounter (Signed)
Returned call to patient 07/19/18 echo results given.Copy sent to PCP Dr.John Redding.

## 2018-07-23 ENCOUNTER — Other Ambulatory Visit: Payer: Self-pay

## 2018-07-23 ENCOUNTER — Telehealth: Payer: Self-pay | Admitting: Cardiology

## 2018-07-23 DIAGNOSIS — I10 Essential (primary) hypertension: Secondary | ICD-10-CM

## 2018-07-23 DIAGNOSIS — I480 Paroxysmal atrial fibrillation: Secondary | ICD-10-CM

## 2018-07-23 MED ORDER — POTASSIUM CHLORIDE CRYS ER 20 MEQ PO TBCR: 20.0000 meq | EXTENDED_RELEASE_TABLET | Freq: Every day | ORAL | 6 refills | Status: DC

## 2018-07-23 MED ORDER — FUROSEMIDE 20 MG PO TABS: 20.0000 mg | ORAL_TABLET | Freq: Every day | ORAL | 6 refills | Status: DC

## 2018-07-23 NOTE — Telephone Encounter (Signed)
New Message   Pt is calling for results from a test she had on Friday  Please call

## 2018-07-23 NOTE — Telephone Encounter (Signed)
Returned call to patient [name] results given.

## 2018-07-29 ENCOUNTER — Other Ambulatory Visit: Payer: Self-pay | Admitting: Gastroenterology

## 2018-08-07 DIAGNOSIS — I480 Paroxysmal atrial fibrillation: Secondary | ICD-10-CM | POA: Diagnosis not present

## 2018-08-07 DIAGNOSIS — I10 Essential (primary) hypertension: Secondary | ICD-10-CM | POA: Diagnosis not present

## 2018-08-07 LAB — BASIC METABOLIC PANEL
BUN / CREAT RATIO: 28 (ref 12–28)
BUN: 23 mg/dL (ref 8–27)
CALCIUM: 9.6 mg/dL (ref 8.7–10.3)
CHLORIDE: 98 mmol/L (ref 96–106)
CO2: 25 mmol/L (ref 20–29)
Creatinine, Ser: 0.82 mg/dL (ref 0.57–1.00)
GFR, EST AFRICAN AMERICAN: 80 mL/min/{1.73_m2} (ref >59)
GFR, EST NON AFRICAN AMERICAN: 70 mL/min/{1.73_m2} (ref >59)
Glucose: 86 mg/dL (ref 65–99)
Potassium: 4.5 mmol/L (ref 3.5–5.2)
Sodium: 140 mmol/L (ref 134–144)

## 2018-08-26 ENCOUNTER — Other Ambulatory Visit: Payer: Self-pay | Admitting: Gastroenterology

## 2018-08-26 ENCOUNTER — Ambulatory Visit: Payer: PPO | Admitting: Cardiology

## 2018-08-29 ENCOUNTER — Telehealth: Payer: Self-pay | Admitting: Cardiology

## 2018-08-29 ENCOUNTER — Telehealth: Payer: Self-pay | Admitting: Gastroenterology

## 2018-08-29 DIAGNOSIS — I10 Essential (primary) hypertension: Secondary | ICD-10-CM

## 2018-08-29 DIAGNOSIS — I480 Paroxysmal atrial fibrillation: Secondary | ICD-10-CM

## 2018-08-29 NOTE — Telephone Encounter (Signed)
Pt stated that someone called her, but she couldn't get to the phone in time.

## 2018-08-29 NOTE — Telephone Encounter (Signed)
Spoke to patient to inform her that [name] has been approved by her insurance. She states she still cannot afford the medication and would like to try Mesalamine. Message sent to dr Ardis Hughs.

## 2018-08-29 NOTE — Telephone Encounter (Signed)
Returned call to patient she stated since she started taking lasix and potassium on 07/24/18 she has noticed about 1 hour after taking she gets a headache.Stated since [place] changed her ramipril to losartan on 07/08/18 her B/P has been averaging higher, 146/71 today 156/90.Advised I will send message to [place] for advice.

## 2018-08-29 NOTE — Telephone Encounter (Signed)
New Message:    Pt says she is taking Lasix and Potassium every day. She wants to know if she can take it every other day? Pt says she is having headaches and nosebleeds.

## 2018-08-30 MED ORDER — LOSARTAN POTASSIUM 50 MG PO TABS: ORAL_TABLET | ORAL | 6 refills | Status: DC

## 2018-08-30 NOTE — Telephone Encounter (Signed)
With BP being high she should increase losartan to 100 mg daily. She can take lasix daily. We will need a follow up BMET and would also check a serum protein electrophoresis- to look for amyloid.  Peter Martinique MD, [facility]

## 2018-08-30 NOTE — Telephone Encounter (Signed)
Returned call to patient Dr.Jordan's recommendation given.Advised to call back in 2 weeks if B/P continues to be elevated.Advised to have bmet and serum protein electrophoresis in 1 week.Lab orders mailed.

## 2018-08-30 NOTE — Telephone Encounter (Signed)
Pharmacy called back and asked if patient can have the generic Apriso. Per Barb Merino, CGRN that would be okay.

## 2018-09-06 DIAGNOSIS — I480 Paroxysmal atrial fibrillation: Secondary | ICD-10-CM | POA: Diagnosis not present

## 2018-09-06 DIAGNOSIS — I10 Essential (primary) hypertension: Secondary | ICD-10-CM | POA: Diagnosis not present

## 2018-09-09 LAB — BASIC METABOLIC PANEL
BUN/Creatinine Ratio: 25 (ref 12–28)
BUN: 21 mg/dL (ref 8–27)
CO2: 28 mmol/L (ref 20–29)
Calcium: 9.5 mg/dL (ref 8.7–10.3)
Chloride: 102 mmol/L (ref 96–106)
Creatinine, Ser: 0.83 mg/dL (ref 0.57–1.00)
GFR calc Af Amer: 79 mL/min/{1.73_m2} (ref >59)
GFR calc non Af Amer: 69 mL/min/{1.73_m2} (ref >59)
Glucose: 107 mg/dL — ABNORMAL HIGH (ref 65–99)
Potassium: 3.9 mmol/L (ref 3.5–5.2)
Sodium: 143 mmol/L (ref 134–144)

## 2018-09-09 LAB — PROTEIN ELECTROPHORESIS, SERUM
A/G Ratio: 1.1 (ref 0.7–1.7)
Albumin ELP: 3.6 g/dL (ref 2.9–4.4)
Alpha 1: 0.2 g/dL (ref 0.0–0.4)
Alpha 2: 0.8 g/dL (ref 0.4–1.0)
Beta: 1 g/dL (ref 0.7–1.3)
Gamma Globulin: 1.4 g/dL (ref 0.4–1.8)
Globulin, Total: 3.4 g/dL (ref 2.2–3.9)
Total Protein: 7 g/dL (ref 6.0–8.5)

## 2018-09-11 NOTE — Progress Notes (Deleted)
{Choose 1 Note Type (Telehealth Visit or Telephone Visit):[phone]}   Evaluation Performed:  Follow-up visit  Date:  09/11/2018   ID:  Dominique, Terry 08-Sep-1940, MRN [id number]  Patient Location: Home Provider Location: Home  PCP:  Angelina Sheriff, MD  Cardiologist:  Frenchie Pribyl [place] MD Electrophysiologist:  None   Chief Complaint:  dyspnea  History of Present Illness:    [name] is a 78 y.o. female seen for follow up dyspnea. She has a  history of coronary artery disease with NSTEMI and CABG in 11/2010 by Dr. Roxy Manns ( LIMA to the LAD, SVG to OM, SVG to PDA), post-op atrial fibrillation,spasmaticdysphonia, hypertension, and hyperlipidemia. Previously followed by Dr. Saunders Revel. She has chronic dyspnea on exertion. She had a CPX in 2017 showing no cardiac or ventilatory limitation. Regular ETT in March 2018 with hypertensive BP response. lexiscan myoview  in 08/2016 was low risk without ischemia or scar. She also has carotid arterial disease - moderate on the right.   When seen in February she reports increased SOB on exertion such as going up hill or up stairs. Notes her presenting symptoms prior to CABG were of dyspnea only it was much worse then. Some cough. Notes palpitations with exertion and on prior CPX was noted to have PVCs. No chest pain. Reports intolerance to Crestor in past. Has a history of colitis. To further evaluate her symptoms an Echo was obtained showing normal systolic function. There was some diastolic dysfunction and moderate pulmonary HTN. SPEP was normal. Myoview study was normal.   The patient {does/does not:200015} have symptoms concerning for COVID-19 infection (fever, chills, cough, or new shortness of breath).    Past Medical History:  Diagnosis Date  . Anemia    "in my early teens"  . Arthritis    neck and back  . Atrial fibrillation ([place])    post-op after CABG;  treated with Amiodarone and coumadin until readmxn with CDiff colitis, perf colon  and elevated LFTs (amio and coumadin stopped)  . C. difficile colitis 08/1144   complicated post CABG course with prolonged admission, perforated sigmoid colon;  s/p sigmoid colectomy with end colostomy [name] procedure);  laparotomy wound infection with Pseudomonas treated with antibxs and secondary intention  . Chronic kidney disease    hx kidney stones  . Coronary artery disease    a. NSTEMI 7/12, cath: pLAD 30%, mLAD 60-70%, dLAD 80%, oOM2 50%, mOM2 40%, trifurcation 80%, pRCA 25%, then occluded, L-R collats, EF 55%;   b. 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 Roxy Manns) // c. ETT 3/18: inf-lat ST depression of 1 mm at 4 min of exercise, frequent ectopy and hypertensive BP response // d. Myoview 4/18: EF 68, no ischemia; low risk   . 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 GERD (gastroesophageal reflux disease)   . H/O hiatal hernia    "slight; not having problems with it"  . Hair loss    after heart surgery  . Hemorrhoids    "none since hemorrhoid OR"  . History of bronchitis    late 1980's  . Hyperlipidemia    takes Pravastatin daily  . Hypertension    takes Metoprolol daily  . Mitral valve prolapse    was told this in the late 1990's; ;then with Dr.Mclean he states that she doesn't  . Myocardial infarction ([place]) 11/2010  . Palpitations 2003   hx of; Holter in 2003 with PACs and PVCs  . Pericarditis  1980   hx of  . 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 S/P colostomy ([place])    due to perf sigmoid colon in setting of CDiff colitis  . Seizures ([place])    "w/verapimil"  . Surgical wound infection    Pseudomonas; wound VAC; secondary intention  . 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 ABDOMINAL HYSTERECTOMY     partial hysterectomy  . APPENDECTOMY     as a child  . BOTOX INJECTION  12/03/2015   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  . CARDIAC CATHETERIZATION  11/21/10  . CATARACT  EXTRACTION, BILATERAL    . CHOLECYSTECTOMY N/A 01/25/2016   Procedure: LAPAROSCOPIC CHOLECYSTECTOMY WITH INTRAOPERATIVE CHOLANGIOGRAM;  Surgeon: [name], MD;  Location: [facility];  Service: General;  Laterality: N/A;  . COLONOSCOPY    . COLOSTOMY RECONNECT    . colostomy reversal  06/27/11  . CORONARY ARTERY BYPASS GRAFT  11-25-2010   CABG X3; LIMA to LAD, SVG to PDA, SVG to OM, EVH via right thigh  . ESOPHAGOGASTRODUODENOSCOPY    . FLEXIBLE SIGMOIDOSCOPY  11/09/2011   Procedure: FLEXIBLE SIGMOIDOSCOPY;  Surgeon: Milus Banister, MD;  Location: [facility];  Service: Endoscopy;  Laterality: N/A;  . HEMORRHOID SURGERY  ~ 2005   x 2  . OOPHORECTOMY  2005   left; w/"mass removal"  . PROCTOSCOPY  06/27/2011   Procedure: PROCTOSCOPY;  Surgeon: Gayland Curry, MD;  Location: [facility];  Service: General;  Laterality: N/A;  . SIGMOIDECTOMY  12/18/2010   with end colostomy  . TUBAL LIGATION       No outpatient medications have been marked as taking for the 09/16/18 encounter (Appointment) with [place], Keymiah Lyles M, MD.     Allergies:   Nadolol; Other; Sulfa antibiotics; Sulfamethoxazole; Sulfonamide derivatives; Verapamil; and Flagyl [metronidazole]   Social History   Tobacco Use  . Smoking status: Never Smoker  . Smokeless tobacco: Never Used  Substance Use Topics  . Alcohol use: No  . Drug use: No     Family Hx: The patient's family history includes Coronary artery disease in her mother; Heart attack (age of onset: 74) in her father; Heart disease in her father and mother. There is no history of Anesthesia problems, Hypotension, Malignant hyperthermia, Pseudochol deficiency, Colon cancer, Esophageal cancer, Stomach cancer, or Rectal cancer.  ROS:   Please see the history of present illness.    *** All other systems reviewed and are negative.   Prior CV studies:   The following studies were reviewed today:  Echo 07/17/18: 1. The left ventricle has normal systolic function with an ejection  fraction of 60-65%. The cavity size was normal. Left ventricular diastolic Doppler parameters are consistent with pseudonormalization Elevated left atrial and left ventricular  end-diastolic pressures.  2. The right ventricle has normal systolic function. The cavity was normal. There is no increase in right ventricular wall thickness. Right ventricular systolic pressure is moderately elevated with an estimated pressure of 43.7 mmHg.  3. The mitral valve is degenerative. Moderate thickening of the mitral valve leaflets with moderate involvement of the chordae tendinae. Mild calcification of the mitral valve leaflet.  4. The tricuspid valve is normal in structure.  5. The aortic valve is tricuspid Aortic valve regurgitation is mild by color flow Doppler.  6. The pulmonic valve was normal in structure.  7. The inferior vena cava was dilated in size with <50% respiratory variability.  8. The interatrial septum appears to be lipomatous.  Myoview 07/19/18: Study Highlights     The left ventricular ejection fraction is hyperdynamic (>65%).  Nuclear stress EF: 67%.  No T wave inversion was noted during stress.  There was no ST segment deviation noted during stress.  The study is normal.  This is a low risk study.   Normal perfusion. LVEF 67% with normal wall motion. This is a low risk study. No change compared to prior study in 2018.     Labs/Other Tests and Data Reviewed:    EKG:  {EKG/Telemetry Strips Reviewed:[phone]}  Recent Labs: 04/17/2018: Hemoglobin 12.5; Platelets 135.0 09/06/2018: BUN 21; Creatinine, Ser 0.83; Potassium 3.9; Sodium 143   Recent Lipid Panel Lab Results  Component Value Date/Time   CHOL 169 08/04/2016 09:26 AM   TRIG 93 08/04/2016 09:26 AM   HDL 64 08/04/2016 09:26 AM   CHOLHDL 2.6 08/04/2016 09:26 AM   CHOLHDL 3 04/13/2014 09:34 AM   LDLCALC 86 08/04/2016 09:26 AM   LDLDIRECT 81.8 07/20/2011 03:04 PM   Dated 07/02/18: CMET normal. Cholesterol 161,  Triglycerides 81, HDL 52, LDL 93  Wt Readings from Last 3 Encounters:  07/19/18 137 lb (62.1 kg)  07/08/18 137 lb 6.4 oz (62.3 kg)  04/17/18 139 lb (63 kg)     Objective:    Vital Signs:  There were no vitals taken for this visit.   {[facility] Virtual Exam (Optional):[phone]::"VITAL SIGNS:  reviewed"}  ASSESSMENT & PLAN:    1.  CAD s/p CABG x 3 in 2012 for multivessel CAD. Normal Myoview in 2018 and more recently in March.   2. Hypercholesterolemia. LDL is not at goal of < 70. Unable to tolerate more potent statins. Does not want to take Zetia due to potential for bowel symptoms. May need to consider PCSK 9 inhibitor. Will address on future visit.   3. Carotid arterial disease. 60-79% RICA stenosis.   4. HTN controlled.   5. Dyspnea. Extensive evaluation in the past. We did switch her Altace to losartan. Also given a trial of lasix 20 mg daily.   Low voltage on Ecg could be a sign of amyloid but SPEP normal.     COVID-19 Education: The signs and symptoms of COVID-19 were discussed with the patient and how to seek care for testing (follow up with PCP or arrange E-visit).  ***The importance of social distancing was discussed today.  Time:   Today, I have spent *** minutes with the patient with telehealth technology discussing the above problems.     Medication Adjustments/Labs and Tests Ordered: Current medicines are reviewed at length with the patient today.  Concerns regarding medicines are outlined above.   Tests Ordered: No orders of the defined types were placed in this encounter.   Medication Changes: No orders of the defined types were placed in this encounter.   Disposition:  Follow up {follow up:15908}  Signed, Keidrick Murty [place], MD  09/11/2018 2:54 PM    [facility]

## 2018-09-16 ENCOUNTER — Telehealth: Payer: PPO | Admitting: Cardiology

## 2018-09-17 ENCOUNTER — Telehealth: Payer: Self-pay | Admitting: Physician Assistant

## 2018-09-17 NOTE — Telephone Encounter (Signed)
Tele-visit/ consent/ my chart/ pre reg completed

## 2018-09-18 ENCOUNTER — Other Ambulatory Visit: Payer: Self-pay | Admitting: Internal Medicine

## 2018-09-18 ENCOUNTER — Telehealth: Payer: Self-pay

## 2018-09-18 ENCOUNTER — Telehealth (INDEPENDENT_AMBULATORY_CARE_PROVIDER_SITE_OTHER): Payer: PPO | Admitting: Physician Assistant

## 2018-09-18 ENCOUNTER — Encounter: Payer: Self-pay | Admitting: Physician Assistant

## 2018-09-18 VITALS — BP 139/75 | HR 65 | Ht 66.0 in | Wt 136.2 lb

## 2018-09-18 DIAGNOSIS — G44209 Tension-type headache, unspecified, not intractable: Secondary | ICD-10-CM

## 2018-09-18 DIAGNOSIS — E785 Hyperlipidemia, unspecified: Secondary | ICD-10-CM | POA: Diagnosis not present

## 2018-09-18 DIAGNOSIS — I10 Essential (primary) hypertension: Secondary | ICD-10-CM

## 2018-09-18 DIAGNOSIS — I25810 Atherosclerosis of coronary artery bypass graft(s) without angina pectoris: Secondary | ICD-10-CM

## 2018-09-18 DIAGNOSIS — I6523 Occlusion and stenosis of bilateral carotid arteries: Secondary | ICD-10-CM

## 2018-09-18 NOTE — Telephone Encounter (Signed)
Virtual Visit Pre-Appointment Phone Call  "(Name), I am calling you today to discuss your upcoming appointment. We are currently trying to limit exposure to the virus that causes COVID-19 by seeing patients at home rather than in the office."  1. "What is the BEST phone number to call the day of the visit?" - include this in appointment notes  2. "Do you have or have access to (through a family member/friend) a smartphone with video capability that we can use for your visit?" a. If yes - list this number in appt notes as "cell" (if different from BEST phone #) and list the appointment type as a VIDEO visit in appointment notes b. If no - list the appointment type as a PHONE visit in appointment notes  3. Confirm consent - "In the setting of the current Covid19 crisis, you are scheduled for a PHONE visit with your provider on 09/18/2018 at 8:00AM.  Just as we do with many in-office visits, in order for you to participate in this visit, we must obtain consent.  If you'd like, I can send this to your mychart (if signed up) or email for you to review.  Otherwise, I can obtain your verbal consent now.  All virtual visits are billed to your insurance company just like a normal visit would be.  By agreeing to a virtual visit, we'd like you to understand that the technology does not allow for your provider to perform an examination, and thus may limit your provider's ability to fully assess your condition. If your provider identifies any concerns that need to be evaluated in person, we will make arrangements to do so.  Finally, though the technology is pretty good, we cannot assure that it will always work on either your or our end, and in the setting of a video visit, we may have to convert it to a phone-only visit.  In either situation, we cannot ensure that we have a secure connection.  Are you willing to proceed?" STAFF: Did the patient verbally acknowledge consent to telehealth visit? Document YES/NO  here: YES  4. Advise patient to be prepared - "Two hours prior to your appointment, go ahead and check your blood pressure, pulse, oxygen saturation, and your weight (if you have the equipment to check those) and write them all down. When your visit starts, your provider will ask you for this information. If you have an Apple Watch or Kardia device, please plan to have heart rate information ready on the day of your appointment. Please have a pen and paper handy nearby the day of the visit as well."  5. Give patient instructions for MyChart download to smartphone OR [email or web address] as below if video visit (depending on what platform provider is using)  6. Inform patient they will receive a phone call 15 minutes prior to their appointment time (may be from unknown caller ID) so they should be prepared to answer    [place] has been deemed a candidate for a follow-up tele-health visit to limit community exposure during the Covid-19 pandemic. I spoke with the patient via phone to ensure availability of phone/video source, confirm preferred email & phone number, and discuss instructions and expectations.  I reminded [name] to be prepared with any vital sign and/or heart rhythm information that could potentially be obtained via home monitoring, at the time of her visit. I reminded [name] to expect a phone call prior to her visit.  Jacqulynn Cadet, [place] 09/18/2018 8:03 AM   INSTRUCTIONS FOR DOWNLOADING THE MYCHART APP TO SMARTPHONE  - The patient must first make sure to have activated MyChart and know their login information - If Apple, go to [employer] and type in MyChart in the search bar and download the app. If Android, ask patient to go to [employer] and type in [place] in the search bar and download the app. The app is free but as with any other app downloads, their phone may require them to verify saved payment information or  Apple/Android password.  - The patient will need to then log into the app with their MyChart username and password, and select [facility] as their healthcare provider to link the account. When it is time for your visit, go to the MyChart app, find appointments, and click Begin Video Visit. Be sure to Select Allow for your device to access the Microphone and Camera for your visit. You will then be connected, and your provider will be with you shortly.  **If they have any issues connecting, or need assistance please contact MyChart service desk [phone])**  **If using a computer, in order to ensure the best quality for their visit they will need to use either of the following Internet Browsers: [employer], or Google Chrome**  IF USING DOXIMITY or DOXY.ME - The patient will receive a link just prior to their visit by text.     FULL LENGTH CONSENT FOR TELE-HEALTH VISIT   I hereby voluntarily request, consent and authorize [facility] and its employed or contracted physicians, physician assistants, nurse practitioners or other licensed health care professionals (the Practitioner), to provide me with telemedicine health care services (the "Services") as deemed necessary by the treating Practitioner. I acknowledge and consent to receive the Services by the Practitioner via telemedicine. I understand that the telemedicine visit will involve communicating with the Practitioner through live audiovisual communication technology and the disclosure of certain medical information by electronic transmission. I acknowledge that I have been given the opportunity to request an in-person assessment or other available alternative prior to the telemedicine visit and am voluntarily participating in the telemedicine visit.  I understand that I have the right to withhold or withdraw my consent to the use of telemedicine in the course of my care at any time, without affecting my right to future care  or treatment, and that the Practitioner or I may terminate the telemedicine visit at any time. I understand that I have the right to inspect all information obtained and/or recorded in the course of the telemedicine visit and may receive copies of available information for a reasonable fee.  I understand that some of the potential risks of receiving the Services via telemedicine include:  Marland Kitchen Delay or interruption in medical evaluation due to technological equipment failure or disruption; . Information transmitted may not be sufficient (e.g. poor resolution of images) to allow for appropriate medical decision making by the Practitioner; and/or  . In rare instances, security protocols could fail, causing a breach of personal health information.  Furthermore, I acknowledge that it is my responsibility to provide information about my medical history, conditions and care that is complete and accurate to the best of my ability. I acknowledge that Practitioner's advice, recommendations, and/or decision may be based on factors not within their control, such as incomplete or inaccurate data provided by me or distortions of diagnostic images or specimens that may result from electronic transmissions. I understand that the  practice of medicine is not an Chief Strategy Officer and that Practitioner makes no warranties or guarantees regarding treatment outcomes. I acknowledge that I will receive a copy of this consent concurrently upon execution via email to the email address I last provided but may also request a printed copy by calling the office of [facility].    I understand that my insurance will be billed for this visit.   I have read or had this consent read to me. . I understand the contents of this consent, which adequately explains the benefits and risks of the Services being provided via telemedicine.  . I have been provided ample opportunity to ask questions regarding this consent and the Services and have had  my questions answered to my satisfaction. . I give my informed consent for the services to be provided through the use of telemedicine in my medical care  By participating in this telemedicine visit I agree to the above.

## 2018-09-18 NOTE — Progress Notes (Signed)
Virtual Visit via Telephone Note   This visit type was conducted due to national recommendations for restrictions regarding the COVID-19 Pandemic (e.g. social distancing) in an effort to limit this patient's exposure and mitigate transmission in our community.  Due to her co-morbid illnesses, this patient is at least at moderate risk for complications without adequate follow up.  This format is felt to be most appropriate for this patient at this time.  The patient did not have access to video technology/had technical difficulties with video requiring transitioning to audio format only (telephone).  All issues noted in this document were discussed and addressed.  No physical exam could be performed with this format.  Please refer to the patient's chart for her  consent to telehealth for [facility].   Date:  09/18/2018   ID:  Dominique, Terry Sep 29, 1940, MRN [id number]  Patient Location: Home Provider Location: Home  PCP:  Dominique Sheriff, MD  Cardiologist:  Dominique Martinique, MD  Electrophysiologist:  None   Evaluation Performed:  Follow-Up Visit  Chief Complaint:  followup  History of Present Illness:    [name] is a 78 y.o. 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  Cardiopulmonary stress test in 2017 showed no cardiac or ventilatory limitation.  ETT March 2018 showed hypertensive response.  Previous Myoview in April 2018 was low risk but no ischemia or scar.  She is intolerant of Crestor.  Patient was last seen by [name] on 07/08/2018 at which time she complained of increasing dyspnea on exertion going up hills.  This is similar to her previous symptoms prior to CABG.  Echocardiogram obtained on 07/15/2018 revealed EF 60 to 65%, mild AI, moderately elevated RV pressure. Repeat Myoview was performed on 07/19/2018 which  showed EF 67%, normal perfusion.  Her dyspnea on exertion was felt to be related to diastolic heart failure and she was placed on low-dose Lasix and potassium supplement.  Losartan was increased to 100 mg daily.  Patient was contacted today via telephone visit.  She denies any chest pain.  Her shortness of breath has improved slightly.  Recent lab work shows stable renal function and electrolyte.  Her main issue is headache.  She says she never had migraine in the past however for the past month and a half, she has been having frontal headache radiating to the back.  She denies any ipsilateral weakness or blurry vision.  She denies any flashing of lights.  Each episode of headache last about 10 to 15 minutes.  She is wondering if her symptoms could be related to losartan.  She says she did not increase her losartan to 100 mg daily as instructed, instead she has been staying on the 50 mg daily because her systolic blood pressure has been ranging between 106 to 130s at home.  Her blood pressure seem to be quite stable on the 50 mg daily of losartan.  I did inform the patient, my suspicion that losartan is causing her symptom is fairly low as she was able to tolerate losartan for over a month before her symptom of headache started.  I instructed her to discuss this with her primary care doctor.  Although I did inform her that she may consider a trial on half the current dose of losartan for the next week to see if symptoms improve.  If her symptoms  persist, I would recommend her to go back on 50 mg daily of losartan.  The patient does not have symptoms concerning for COVID-19 infection (fever, chills, cough, or new shortness of breath).    Past Medical History:  Diagnosis Date   Anemia    "in my early teens"   Arthritis    neck and back   Atrial fibrillation ([place])    post-op after CABG;  treated with Amiodarone and coumadin until readmxn with CDiff colitis, perf colon and elevated LFTs (amio and  coumadin stopped)   C. difficile colitis 12/1854   complicated post CABG course with prolonged admission, perforated sigmoid colon;  s/p sigmoid colectomy with end colostomy [name]);  laparotomy wound infection with Pseudomonas treated with antibxs and secondary intention   Chronic kidney disease    hx kidney stones   Coronary artery disease    a. NSTEMI 7/12, cath: pLAD 30%, mLAD 60-70%, dLAD 80%, oOM2 50%, mOM2 40%, trifurcation 80%, pRCA 25%, then occluded, L-R collats, EF 55%;   b. s/p CABG: L-LAD, S-D1, S-PDA, OM1 endarterectomy ([name]) // c. ETT 3/18: inf-lat ST depression of 1 mm at 4 min of exercise, frequent ectopy and hypertensive BP response // d. 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   GERD (gastroesophageal reflux disease)    H/O hiatal hernia    "slight; not having problems with it"   Hair loss    after heart surgery   Hemorrhoids    "none since hemorrhoid OR"   History of bronchitis    late 1980's   Hyperlipidemia    takes Pravastatin daily   Hypertension    takes Metoprolol daily   Mitral valve prolapse    was told this in the late 1990's; ;then with [name]   Myocardial infarction ([place]) 11/2010   Palpitations 2003   hx of; Holter in 2003 with PACs and PVCs   Pericarditis 1980   hx of   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   [name]   S/P colostomy ([name])    due to perf sigmoid colon in setting of CDiff colitis   Seizures ([place])    "w/verapimil"   Surgical wound infection    Pseudomonas; wound VAC; secondary intention   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   Past Surgical History:  Terry Laterality Date   ABDOMINAL HYSTERECTOMY     partial hysterectomy   APPENDECTOMY     as a child   BOTOX INJECTION  12/03/2015   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   CARDIAC CATHETERIZATION  11/21/10   CATARACT EXTRACTION, BILATERAL      CHOLECYSTECTOMY N/A 01/25/2016   Terry: LAPAROSCOPIC CHOLECYSTECTOMY WITH INTRAOPERATIVE CHOLANGIOGRAM;  Surgeon: Dominique Pickerel, MD;  Location: [facility];  Service: General;  Laterality: N/A;   COLONOSCOPY     COLOSTOMY RECONNECT     colostomy reversal  06/27/11   CORONARY ARTERY BYPASS GRAFT  11-25-2010   CABG X3; LIMA to LAD, SVG to PDA, SVG to OM, EVH via right thigh   ESOPHAGOGASTRODUODENOSCOPY     FLEXIBLE SIGMOIDOSCOPY  11/09/2011   Terry: FLEXIBLE SIGMOIDOSCOPY;  Surgeon: Milus Banister, MD;  [facility];  Service: Endoscopy;  Laterality: N/A;   HEMORRHOID SURGERY  ~ 2005   x 2   OOPHORECTOMY  2005   left; w/"mass removal"   PROCTOSCOPY  06/27/2011   Terry: PROCTOSCOPY;  Surgeon:  Gayland Curry, MD;  Location: [facility];  Service: General;  Laterality: N/A;   SIGMOIDECTOMY  12/18/2010   with end colostomy   TUBAL LIGATION       Current Meds  Medication Sig   acetaminophen (TYLENOL) 500 MG tablet Take 1,000 mg by mouth every 6 (six) hours as needed for mild pain.   APRISO 0.375 g 24 hr capsule Take 2 capsules by mouth twice daily   aspirin 81 MG tablet Take 81 mg by mouth daily.   Bacillus Coagulans-Inulin (PROBIOTIC-PREBIOTIC) 1-250 BILLION-MG CAPS Take 1 capsule by mouth daily.    Calcium Carb-Cholecalciferol (SM CALCIUM/VITAMIN D) 600-800 MG-UNIT TABS Take 1 tablet by mouth daily.   Coenzyme Q10 200 MG TABS Take 1 tablet by mouth daily.    furosemide (LASIX) 20 MG tablet Take 1 tablet (20 mg total) by mouth daily.   losartan (COZAAR) 50 MG tablet Take 2 tablets ( 100 mg ) daily (Patient taking differently: Take 1 TABLET  daily)   Multiple Vitamin (MULTIVITAMIN) tablet Take 1 tablet by mouth daily.    Omega-3 350 MG CPDR Take 350 mg by mouth daily.   potassium chloride SA (K-DUR,KLOR-CON) 20 MEQ tablet Take 1 tablet (20 mEq total) by mouth daily.   pravastatin (PRAVACHOL) 80 MG tablet TAKE 1 TABLET BY MOUTH ONCE DAILY IN THE EVENING      Allergies:   Nadolol; Other; Sulfa antibiotics; Sulfamethoxazole; Sulfonamide derivatives; Verapamil; and Flagyl [metronidazole]   Social History   Tobacco Use   Smoking status: Never Smoker   Smokeless tobacco: Never Used  Substance Use Topics   Alcohol use: No   Drug use: No     Family Hx: The patient's family history includes Coronary artery disease in her mother; Heart attack (age of onset: 30) in her father; Heart disease in her father and mother. There is no history of Anesthesia problems, Hypotension, Malignant hyperthermia, Pseudochol deficiency, Colon cancer, Esophageal cancer, Stomach cancer, or Rectal cancer.  ROS:   Please see the history of present illness.     All other systems reviewed and are negative.   Prior CV studies:   The following studies were reviewed today:  Myoview 07/19/2018 Study Highlights     The left ventricular ejection fraction is hyperdynamic (>65%).  Nuclear stress EF: 67%.  No T wave inversion was noted during stress.  There was no ST segment deviation noted during stress.  The study is normal.  This is a low risk study.   Normal perfusion. LVEF 67% with normal wall motion. This is a low risk study. No change compared to prior study in 2018.     Labs/Other Tests and Data Reviewed:    EKG:  An ECG dated 07/08/2018 was personally reviewed today and demonstrated:  Sinus bradycardia with poor R wave progression in anterior leads.  Recent Labs: 04/17/2018: Hemoglobin 12.5; Platelets 135.0 09/06/2018: BUN 21; Creatinine, Ser 0.83; Potassium 3.9; Sodium 143   Recent Lipid Panel Lab Results  Component Value Date/Time   CHOL 169 08/04/2016 09:26 AM   TRIG 93 08/04/2016 09:26 AM   HDL 64 08/04/2016 09:26 AM   CHOLHDL 2.6 08/04/2016 09:26 AM   CHOLHDL 3 04/13/2014 09:34 AM   LDLCALC 86 08/04/2016 09:26 AM   LDLDIRECT 81.8 07/20/2011 03:04 PM    Wt Readings from Last 3 Encounters:  09/18/18 136 lb 3.2 oz (61.8 kg)   07/19/18 137 lb (62.1 kg)  07/08/18 137 lb 6.4 oz (62.3 kg)     Objective:  Vital Signs:  BP 139/75    Pulse 65    Ht 5' 6"  (1.676 m)    Wt 136 lb 3.2 oz (61.8 kg)    BMI 21.98 kg/m    VITAL SIGNS:  reviewed  ASSESSMENT & PLAN:    1. Headache: Unclear cause.  Although she suspected losartan was the culprit behind her to headache, looking back, her headache started about a month after she began losartan.  I doubt losartan was the culprit behind her symptom.  Although, I did instruct the patient to decrease losartan by half for the next week as a trial.  If her symptom does not improve, she needs to go back on the 50 mg daily of losartan.  In the meantime, she needs to be evaluated by her PCP.  No flashing of light, ipsilateral weakness, or vision changes during headache.  2. CAD s/p CABG in 2012: Denies any chest pain.  Recent Myoview was normal.  3. Hypertension: Although she was instructed to increase losartan to 100 mg daily, she has been taking 50 mg daily instead.  4. Hyperlipidemia: On Pravachol.  5. Carotid artery disease: Followed by vascular surgery.  The last carotid Doppler obtained on 02/25/2018 showed right ICA 60 to 79% stenosis, 1 to 39% left ICA stenosis.  This degree of carotid disease seems to be stable since 2017.  COVID-19 Education: The signs and symptoms of COVID-19 were discussed with the patient and how to seek care for testing (follow up with PCP or arrange E-visit).  The importance of social distancing was discussed today.  Time:   Today, I have spent 16 minutes with the patient with telehealth technology discussing the above problems.     Medication Adjustments/Labs and Tests Ordered: Current medicines are reviewed at length with the patient today.  Concerns regarding medicines are outlined above.   Tests Ordered: No orders of the defined types were placed in this encounter.   Medication Changes: No orders of the defined types were placed in this  encounter.   Disposition:  Follow up in 4 month(s)  Signed, Almyra Deforest, [place]  09/18/2018 8:07 AM    [place]

## 2018-09-18 NOTE — Telephone Encounter (Signed)
Refill Request.  

## 2018-09-18 NOTE — Patient Instructions (Addendum)
Medication Instructions:   Take half tablet (25 mg) of Losartan daily for 1 week, if your headaches do not  Improve go back to taking a whole tablet (50 mg) of Losartan daily.  If you need a refill on your cardiac medications before your next appointment, please call your pharmacy.   Lab work:  NONE ordered at this time of appointment   If you have labs (blood work) drawn today and your tests are completely normal, you will receive your results only by: Marland Kitchen MyChart Message (if you have MyChart) OR . A paper copy in the mail If you have any lab test that is abnormal or we need to change your treatment, we will call you to review the results.  Testing/Procedures:  NONE ordered at this time of appointment   Follow-Up: At [facility], you and your health needs are our priority.  As part of our continuing mission to provide you with exceptional heart care, we have created designated Provider Care Teams.  These Care Teams include your primary Cardiologist (physician) and Advanced Practice Providers (APPs -  Physician Assistants and Nurse Practitioners) who all work together to provide you with the care you need, when you need it.  Your physician recommends that you keep your scheduled a follow-up appointment in September with Peter Martinique, MD   Any Other Special Instructions Will Be Listed Below (If Applicable).  Follow up with your Primary Care Doctor concerning your headaches

## 2018-09-18 NOTE — Telephone Encounter (Signed)
pravachol 80 mg refilled.

## 2018-09-26 ENCOUNTER — Other Ambulatory Visit: Payer: Self-pay | Admitting: Gastroenterology

## 2018-10-08 DIAGNOSIS — D3131 Benign neoplasm of right choroid: Secondary | ICD-10-CM | POA: Diagnosis not present

## 2018-10-08 DIAGNOSIS — H524 Presbyopia: Secondary | ICD-10-CM | POA: Diagnosis not present

## 2018-10-14 ENCOUNTER — Ambulatory Visit: Payer: PPO | Admitting: Cardiology

## 2018-10-28 ENCOUNTER — Other Ambulatory Visit: Payer: Self-pay | Admitting: Gastroenterology

## 2018-11-22 ENCOUNTER — Other Ambulatory Visit: Payer: Self-pay | Admitting: Gastroenterology

## 2018-11-22 ENCOUNTER — Other Ambulatory Visit: Payer: Self-pay

## 2018-11-22 DIAGNOSIS — I6523 Occlusion and stenosis of bilateral carotid arteries: Secondary | ICD-10-CM

## 2018-11-25 ENCOUNTER — Encounter: Payer: Self-pay | Admitting: Family

## 2018-11-25 ENCOUNTER — Ambulatory Visit (HOSPITAL_COMMUNITY)
Admission: RE | Admit: 2018-11-25 | Discharge: 2018-11-25 | Disposition: A | Discharge status: Home / Self Care | Payer: PPO | Source: Ambulatory Visit | Attending: Family | Admitting: Family

## 2018-11-25 ENCOUNTER — Other Ambulatory Visit: Payer: Self-pay

## 2018-11-25 ENCOUNTER — Ambulatory Visit: Payer: PPO | Admitting: Family

## 2018-11-25 VITALS — BP 128/72 | Temp 97.8°F | Resp 14 | Ht 67.0 in | Wt 140.2 lb

## 2018-11-25 DIAGNOSIS — I6523 Occlusion and stenosis of bilateral carotid arteries: Secondary | ICD-10-CM

## 2018-11-25 NOTE — Progress Notes (Signed)
Chief Complaint: Follow up Extracranial Carotid Artery Stenosis   History of Present Illness  [name] is a 78 y.o. female whomDr. Rubye Beach been monitoring for extracranial carotid artery stenosis. She had a duplex in 2012 which revealed moderate bilateral stenosis. [place] [facility] [place] greater than 70% right carotid stenosis. Peak systolic velocities were to 67 cm/s. Left-sided stenosis was less than 50%. The patient remained asymptomatic.  She denies any known history of stroke or TIA. Specificallyshe deniesa history of amaurosis fugax or monocular blindness, unilateral facial drooping, hemiplegia, orreceptive or expressive aphasia. Shehas nothad previous carotid artery intervention. She states that she does have occasional numbness in the fingers on her right hand, but this does go away. She denies any speech difficulties or amaurosis fugax.  She has a hx of CAD with STEMI in 2012 and underwent CABG. She developed Afib postoperatively, which was treated with amiodarone and coumadin. She did develop C diff colitis and had a perforated sigmoid colon requiring sigmoid colectomy and end colostomy. This was reversed in February 2013. She did go on to be diagnosed with ulcerative colitis.   Also, she has a hx of spasmodic dysphonia and underwent B[facility] injections at [facility] with improvement. She states that she needs to return for another injection, but this is on hold due to the pandemic. She states that it works well for about 3-6 months.  She was intubated for her CABG in 2012, a few days later was intubated again to address a perforated bowel.   In September2017, she had a laparoscopic cholecystectomy by Dr. [name].  Diabetic:no Tobacco use:non-smoker  Pt meds include: Statin :yes ASA:yes Other anticoagulants/antiplatelets:no   Past Medical History:  Diagnosis Date  . Anemia    "in my early teens"  .  Arthritis    neck and back  . Atrial fibrillation ([place])    post-op after CABG;  treated with Amiodarone and coumadin until readmxn with CDiff colitis, perf colon and elevated LFTs (amio and coumadin stopped)  . C. difficile colitis 0/0923   complicated post CABG course with prolonged admission, perforated sigmoid colon;  s/p sigmoid colectomy with end colostomy [name] procedure);  laparotomy wound infection with Pseudomonas treated with antibxs and secondary intention  . Chronic kidney disease    hx kidney stones  . Coronary artery disease    a. NSTEMI 7/12, cath: pLAD 30%, mLAD 60-70%, dLAD 80%, oOM2 50%, mOM2 40%, trifurcation 80%, pRCA 25%, then occluded, L-R collats, EF 55%;   b. 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 Roxy Manns) // c. ETT 3/18: inf-lat ST depression of 1 mm at 4 min of exercise, frequent ectopy and hypertensive BP response // d. Myoview 4/18: EF 68, no ischemia; low risk   . 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 GERD (gastroesophageal reflux disease)   . H/O hiatal hernia    "slight; not having problems with it"  . Hair loss    after heart surgery  . Hemorrhoids    "none since hemorrhoid OR"  . History of bronchitis    late 1980's  . Hyperlipidemia    takes Pravastatin daily  . Hypertension    takes Metoprolol daily  . Mitral valve prolapse    was told this in the late 1990's; ;then with Dr.Mclean he states that she doesn't  . Myocardial infarction ([place]) 11/2010  . Palpitations 2003   hx of; Holter in 2003 with PACs and PVCs  .  Pericarditis 1980   hx of  . 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 S/P colostomy ([place])    due to perf sigmoid colon in setting of CDiff colitis  . Seizures ([place])    "w/verapimil"  . Surgical wound infection    Pseudomonas; wound VAC; secondary intention  . 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 Smoking status: Never Smoker  . Smokeless tobacco: Never Used   Substance Use Topics  . Alcohol use: No  . Drug use: No    Family History Family History  Problem Relation Age of Onset  . Heart attack Father 56  . Heart disease Father        massive heart attack  . Coronary artery disease Mother        [place]  . Heart disease Mother   . Anesthesia problems Neg Hx   . Hypotension Neg Hx   . Malignant hyperthermia Neg Hx   . Pseudochol deficiency Neg Hx   . Colon cancer Neg Hx   . Esophageal cancer Neg Hx   . Stomach cancer Neg Hx   . Rectal cancer Neg Hx     Surgical History Past Surgical History:  Procedure Laterality Date  . ABDOMINAL HYSTERECTOMY     partial hysterectomy  . APPENDECTOMY     as a child  . BOTOX INJECTION  12/03/2015   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  . CARDIAC CATHETERIZATION  11/21/10  . CATARACT EXTRACTION, BILATERAL    . CHOLECYSTECTOMY N/A 01/25/2016   Procedure: LAPAROSCOPIC CHOLECYSTECTOMY WITH INTRAOPERATIVE CHOLANGIOGRAM;  Surgeon: [name], MD;  [facility];  Service: General;  Laterality: N/A;  . COLONOSCOPY    . COLOSTOMY RECONNECT    . colostomy reversal  06/27/11  . CORONARY ARTERY BYPASS GRAFT  11-25-2010   CABG X3; LIMA to LAD, SVG to PDA, SVG to OM, EVH via right thigh  . ESOPHAGOGASTRODUODENOSCOPY    . FLEXIBLE SIGMOIDOSCOPY  11/09/2011   Procedure: FLEXIBLE SIGMOIDOSCOPY;  Surgeon: Milus Banister, MD;  [facility];  Service: Endoscopy;  Laterality: N/A;  . HEMORRHOID SURGERY  ~ 2005   x 2  . OOPHORECTOMY  2005   left; w/"mass removal"  . PROCTOSCOPY  06/27/2011   Procedure: PROCTOSCOPY;  Surgeon: Gayland Curry, MD;  Location: [facility];  Service: General;  Laterality: N/A;  . SIGMOIDECTOMY  12/18/2010   with end colostomy  . TUBAL LIGATION      Allergies  Allergen Reactions  . Nadolol Hives and Anaphylaxis    Rash and nausea  . Other Other (See Comments) and Anaphylaxis    apples Apples-anaphylaxis  . Sulfa Antibiotics Hives   . Sulfamethoxazole Anaphylaxis  . Sulfonamide Derivatives Anaphylaxis  . Verapamil Other (See Comments) and Anaphylaxis    seizures  . Flagyl [Metronidazole] Hives    Current Outpatient Medications  Medication Sig Dispense Refill  . acetaminophen (TYLENOL) 500 MG tablet Take 1,000 mg by mouth every 6 (six) hours as needed for mild pain.    Marland Kitchen aspirin 81 MG tablet Take 81 mg by mouth daily.    . Bacillus Coagulans-Inulin (PROBIOTIC-PREBIOTIC) 1-250 BILLION-MG CAPS Take 1 capsule by mouth daily.     . Calcium Carb-Cholecalciferol (SM CALCIUM/VITAMIN D) 600-800 MG-UNIT TABS Take 1 tablet by mouth daily.    . Coenzyme Q10 200 MG TABS Take 1 tablet by mouth daily.   0  .  losartan (COZAAR) 50 MG tablet Take 2 tablets ( 100 mg ) daily (Patient taking differently: Take 1 TABLET  daily) 60 tablet 6  . magnesium oxide (MAG-OX) 400 MG tablet Take 1 tablet (400 mg total) by mouth daily. 30 tablet 6  . mesalamine (APRISO) 0.375 g 24 hr capsule TAKE 2  BY MOUTH TWICE DAILY 120 capsule 0  . Multiple Vitamin (MULTIVITAMIN) tablet Take 1 tablet by mouth daily.     . Omega-3 350 MG CPDR Take 350 mg by mouth daily.    . potassium chloride SA (K-DUR,KLOR-CON) 20 MEQ tablet Take 1 tablet (20 mEq total) by mouth daily. 30 tablet 6  . pravastatin (PRAVACHOL) 80 MG tablet TAKE 1 TABLET BY MOUTH ONCE DAILY IN THE EVENING 90 tablet 3  . furosemide (LASIX) 20 MG tablet Take 1 tablet (20 mg total) by mouth daily. 30 tablet 6   No current facility-administered medications for this visit.     Review of Systems : See HPI for pertinent positives and negatives.  Physical Examination  Vitals:   11/25/18 1419  BP: 128/72  Resp: 14  Temp: 97.8 F (36.6 C)  TempSrc: Temporal  SpO2: 98%  Weight: 140 lb 3.2 oz (63.6 kg)  Height: 5' 7"  (1.702 m)   Body mass index is 21.96 kg/m.  General: WDWN female in NAD GAIT: normal Eyes: PERRLA HENT: No gross abnormalities.  Pulmonary:  Respirations are non-labored,  good air movement in all fields, CTAB,   No rales, rhonchi, or wheezing. Cardiac: Irregular rhythm with controlled rate at 78/minute, no detected murmur.  VASCULAR EXAM Carotid Bruits Right Left   Negative Negative     Abdominal aortic pulse is not palpable. Radial pulses are 2+ palpable and equal.                                                                                                                            LE Pulses Right Left       POPLITEAL  not palpable   not palpable       POSTERIOR TIBIAL  2+ palpable   2+ palpable        DORSALIS PEDIS      ANTERIOR TIBIAL not palpable  not palpable     Gastrointestinal: soft, nontender, BS WNL, no r/g, no palpable masses. Musculoskeletal: no muscle atrophy/wasting. M/S 5/5 throughout, extremities without ischemic changes Skin: No rashes, no ulcers, no cellulitis.   Neurologic:  A&O X 3; appropriate affect, sensation is normal; speech is spasmodic and dysphonic, CN 2-12 intact, pain and light touch intact in extremities, motor exam as listed above. Psychiatric: Normal thought content, mood appropriate to clinical situation.    Assessment: [name] is a 78 y.o. female  whowho has no hx of stroke or TIA.  Today's carotid duplex remains stable compared to the [place] 06-19-16, 02-01-17, 08-06-17, and 02-26-18: 60-79% stenosis in the right ICA, 1-39% stenosis in the left ICA.   Fortunately  she does not have DM and has never used tobacco. She takes a daily ASA and a statin. She remains active. She has a normal BMI.  She states that her cardiologist, Dr. Martinique, is aware of her paroxysmal atrial fib, her cardiac rhythm is irregular now, but she is asymptomatic of this; denies dyspnea or chest pain, denies feeling light headed.    DATA Carotid Duplex (11-25-18): Right ICA: 60-79% stenosis Left ICA: 1-39% stenosis Bilateral vertebral artery flow is antegrade.  Bilateral subclavian artery waveforms are normal. No  significant change compared to the [place] 06-19-16, 02-01-17, 08-06-17, and 02-26-18.   Plan: Follow-up in 9 months with Carotid Duplex scan.   I discussed in depth with the patient the nature of atherosclerosis, and emphasized the importance of maximal medical management including strict control of blood pressure, blood glucose, and lipid levels, obtaining regular exercise, and continued cessation of smoking.  The patient is aware that without maximal medical management the underlying atherosclerotic disease process will progress, limiting the benefit of any interventions. The patient was given information about stroke prevention and what symptoms should prompt the patient to seek immediate medical care. Thank you for allowing [place] to participate in this patient's care.  Clemon Chambers, RN, MSN, FNP-C [facility] Office: [facility] Physician: Trula Slade  11/25/18 2:28 PM

## 2018-11-25 NOTE — Patient Instructions (Signed)

## 2018-11-26 ENCOUNTER — Ambulatory Visit: Payer: PPO | Admitting: Family

## 2018-11-26 ENCOUNTER — Encounter (HOSPITAL_COMMUNITY): Payer: PPO

## 2018-12-25 ENCOUNTER — Other Ambulatory Visit: Payer: Self-pay | Admitting: Gastroenterology

## 2018-12-27 ENCOUNTER — Telehealth: Payer: Self-pay | Admitting: Gastroenterology

## 2018-12-27 ENCOUNTER — Other Ambulatory Visit: Payer: Self-pay | Admitting: Gastroenterology

## 2018-12-27 NOTE — Telephone Encounter (Signed)
Medicine refilled.

## 2019-01-21 ENCOUNTER — Other Ambulatory Visit: Payer: Self-pay | Admitting: Gastroenterology

## 2019-01-21 ENCOUNTER — Telehealth: Payer: Self-pay | Admitting: Gastroenterology

## 2019-01-21 ENCOUNTER — Telehealth: Payer: Self-pay | Admitting: Cardiology

## 2019-01-21 DIAGNOSIS — I10 Essential (primary) hypertension: Secondary | ICD-10-CM

## 2019-01-21 DIAGNOSIS — D649 Anemia, unspecified: Secondary | ICD-10-CM

## 2019-01-21 DIAGNOSIS — E785 Hyperlipidemia, unspecified: Secondary | ICD-10-CM

## 2019-01-21 DIAGNOSIS — I2510 Atherosclerotic heart disease of native coronary artery without angina pectoris: Secondary | ICD-10-CM

## 2019-01-21 DIAGNOSIS — I480 Paroxysmal atrial fibrillation: Secondary | ICD-10-CM

## 2019-01-21 NOTE — Telephone Encounter (Signed)
Spoke to patient advised she is due for fasting lipid panel,cmet,cbc.She will have done 01/23/19.Orders placed.

## 2019-01-21 NOTE — Telephone Encounter (Signed)
Labs

## 2019-01-21 NOTE — Telephone Encounter (Signed)
  Patient is checking to see if there is any blood work that she needs to have done before her appt with Dr Martinique on 01/30/19. If so, she would like to get it done this Thursday when she brings her husband in for his appt. Please let her know.

## 2019-01-24 DIAGNOSIS — I10 Essential (primary) hypertension: Secondary | ICD-10-CM | POA: Diagnosis not present

## 2019-01-24 DIAGNOSIS — E785 Hyperlipidemia, unspecified: Secondary | ICD-10-CM | POA: Diagnosis not present

## 2019-01-24 DIAGNOSIS — I2510 Atherosclerotic heart disease of native coronary artery without angina pectoris: Secondary | ICD-10-CM | POA: Diagnosis not present

## 2019-01-24 DIAGNOSIS — D649 Anemia, unspecified: Secondary | ICD-10-CM | POA: Diagnosis not present

## 2019-01-24 DIAGNOSIS — I480 Paroxysmal atrial fibrillation: Secondary | ICD-10-CM | POA: Diagnosis not present

## 2019-01-24 LAB — COMPREHENSIVE METABOLIC PANEL
ALT: 12 IU/L (ref 0–32)
AST: 22 IU/L (ref 0–40)
Albumin/Globulin Ratio: 1.6 (ref 1.2–2.2)
Albumin: 4.4 g/dL (ref 3.7–4.7)
Alkaline Phosphatase: 63 IU/L (ref 39–117)
BUN/Creatinine Ratio: 23 (ref 12–28)
BUN: 20 mg/dL (ref 8–27)
Bilirubin Total: 0.6 mg/dL (ref 0.0–1.2)
CO2: 26 mmol/L (ref 20–29)
Calcium: 9.7 mg/dL (ref 8.7–10.3)
Chloride: 101 mmol/L (ref 96–106)
Creatinine, Ser: 0.86 mg/dL (ref 0.57–1.00)
GFR calc Af Amer: 76 mL/min/{1.73_m2} (ref >59)
GFR calc non Af Amer: 66 mL/min/{1.73_m2} (ref >59)
Globulin, Total: 2.8 g/dL (ref 1.5–4.5)
Glucose: 101 mg/dL — ABNORMAL HIGH (ref 65–99)
Potassium: 4.7 mmol/L (ref 3.5–5.2)
Sodium: 137 mmol/L (ref 134–144)
Total Protein: 7.2 g/dL (ref 6.0–8.5)

## 2019-01-24 LAB — CBC WITH DIFFERENTIAL/PLATELET
Basophils Absolute: 0 10*3/uL (ref 0.0–0.2)
Basos: 1 %
EOS (ABSOLUTE): 0 10*3/uL (ref 0.0–0.4)
Eos: 1 %
Hematocrit: 42.1 % (ref 34.0–46.6)
Hemoglobin: 13.1 g/dL (ref 11.1–15.9)
Immature Grans (Abs): 0 10*3/uL (ref 0.0–0.1)
Immature Granulocytes: 0 %
Lymphocytes Absolute: 0.7 10*3/uL (ref 0.7–3.1)
Lymphs: 23 %
MCH: 27.7 pg (ref 26.6–33.0)
MCHC: 31.1 g/dL — ABNORMAL LOW (ref 31.5–35.7)
MCV: 89 fL (ref 79–97)
Monocytes Absolute: 0.3 10*3/uL (ref 0.1–0.9)
Monocytes: 8 %
Neutrophils Absolute: 2.1 10*3/uL (ref 1.4–7.0)
Neutrophils: 67 %
Platelets: 127 10*3/uL — ABNORMAL LOW (ref 150–450)
RBC: 4.73 x10E6/uL (ref 3.77–5.28)
RDW: 13.9 % (ref 11.7–15.4)
WBC: 3.1 10*3/uL — ABNORMAL LOW (ref 3.4–10.8)

## 2019-01-24 LAB — LIPID PANEL
Chol/HDL Ratio: 2.8 ratio (ref 0.0–4.4)
Cholesterol, Total: 163 mg/dL (ref 100–199)
HDL: 58 mg/dL (ref >39)
LDL Chol Calc (NIH): 88 mg/dL (ref 0–99)
Triglycerides: 90 mg/dL (ref 0–149)
VLDL Cholesterol Cal: 17 mg/dL (ref 5–40)

## 2019-01-27 ENCOUNTER — Other Ambulatory Visit: Payer: Self-pay

## 2019-01-27 DIAGNOSIS — E785 Hyperlipidemia, unspecified: Secondary | ICD-10-CM

## 2019-01-27 DIAGNOSIS — I2510 Atherosclerotic heart disease of native coronary artery without angina pectoris: Secondary | ICD-10-CM

## 2019-01-27 MED ORDER — POTASSIUM CHLORIDE CRYS ER 20 MEQ PO TBCR: 20.0000 meq | EXTENDED_RELEASE_TABLET | Freq: Every day | ORAL | 6 refills | Status: DC

## 2019-01-27 MED ORDER — FUROSEMIDE 20 MG PO TABS: 20.0000 mg | ORAL_TABLET | Freq: Every day | ORAL | 6 refills | Status: DC

## 2019-01-27 MED ORDER — LOSARTAN POTASSIUM 50 MG PO TABS: 50.0000 mg | ORAL_TABLET | Freq: Every day | ORAL | 3 refills | Status: DC

## 2019-01-27 MED ORDER — EZETIMIBE 10 MG PO TABS: 10.0000 mg | ORAL_TABLET | Freq: Every day | ORAL | 6 refills | Status: DC

## 2019-01-27 NOTE — Telephone Encounter (Signed)
Refill

## 2019-01-27 NOTE — Progress Notes (Signed)
Virtual Visit via Telephone Note   This visit type was conducted due to national recommendations for restrictions regarding the COVID-19 Pandemic (e.g. social distancing) in an effort to limit this patient's exposure and mitigate transmission in our community.  Due to her co-morbid illnesses, this patient is at least at moderate risk for complications without adequate follow up.  This format is felt to be most appropriate for this patient at this time.  The patient did not have access to video technology/had technical difficulties with video requiring transitioning to audio format only (telephone).  All issues noted in this document were discussed and addressed.  No physical exam could be performed with this format.  Please refer to the patient's chart for her  consent to telehealth for [facility].   Date:  01/30/2019   ID:  Dominique, Terry January 30, 1941, MRN [id number]  Patient Location: Home Provider Location: Home  PCP:  Angelina Sheriff, MD  Cardiologist:  Bliss Tsang Martinique, MD  Electrophysiologist:  None   Evaluation Performed:  Follow-Up Visit  Chief Complaint:  CAD  History of Present Illness:    [name] is a 78 y.o. 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 Roxy Manns (LIMA-LAD, SVG-OM, SVG-PDA), postop atrial fibrillation, spastic dysphonia, carotid artery disease, hypertension and hyperlipidemia.  Cardiopulmonary stress test in 2017 showed no cardiac or ventilatory limitation.  ETT March 2018 showed hypertensive response.  Previous Myoview in April 2018 was low risk but no ischemia or scar.  She is intolerant of Crestor.  Patient was last seen by Dr. Martinique on 07/08/2018 at which time she complained of increasing dyspnea on exertion going up hills.  This is similar to her previous symptoms prior to CABG.  Echocardiogram obtained on 07/15/2018 revealed EF 60 to 65%, mild AI, moderately elevated RV pressure. Repeat Myoview was performed on 07/19/2018 which  showed EF 67%, normal perfusion.  Her dyspnea on exertion was felt to be related to diastolic heart failure and she was placed on low-dose Lasix and potassium supplement.  Losartan was increased to 100 mg daily.  She reports that for the past week her breathing has been worse with increased dyspnea particularly in the am. Weight has increased 2-3 lbs. No swelling. Some dry cough. This is better since switched from ACEi to losartan. No chest pain. She is walking 30 minutes a day. SOB going up incline. She is now taking Zetia 10 mg daily.   The patient does not have symptoms concerning for COVID-19 infection (fever, chills, cough, or new shortness of breath).    Past Medical History:  Diagnosis Date   Anemia    "in my early teens"   Arthritis    neck and back   Atrial fibrillation ([place])    post-op after CABG;  treated with Amiodarone and coumadin until readmxn with CDiff colitis, perf colon and elevated LFTs (amio and coumadin stopped)   C. difficile colitis 01/1477   complicated post CABG course with prolonged admission, perforated sigmoid colon;  s/p sigmoid colectomy with end colostomy [name] procedure);  laparotomy wound infection with Pseudomonas treated with antibxs and secondary intention   Chronic kidney disease    hx kidney stones   Coronary artery disease    a. NSTEMI 7/12, cath: pLAD 30%, mLAD 60-70%, dLAD 80%, oOM2 50%, mOM2 40%, trifurcation 80%, pRCA 25%, then occluded, L-R collats, EF 55%;   b. 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 Roxy Manns) // c. ETT 3/18:  inf-lat ST depression of 1 mm at 4 min of exercise, frequent ectopy and hypertensive BP response // d. 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   GERD (gastroesophageal reflux disease)    H/O hiatal hernia    "slight; not having problems with it"   Hair loss    after heart surgery   Hemorrhoids    "none since hemorrhoid OR"   History of  bronchitis    late 1980's   Hyperlipidemia    takes Pravastatin daily   Hypertension    takes Metoprolol daily   Mitral valve prolapse    was told this in the late 1990's; ;then with Dr.Mclean he states that she doesn't   Myocardial infarction ([place]) 11/2010   Palpitations 2003   hx of; Holter in 2003 with PACs and PVCs   Pericarditis 1980   hx of   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   DR.OWEN   S/P colostomy ([place])    due to perf sigmoid colon in setting of CDiff colitis   Seizures ([place])    "w/verapimil"   Surgical wound infection    Pseudomonas; wound VAC; secondary intention   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   Past Surgical History:  Procedure Laterality Date   ABDOMINAL HYSTERECTOMY     partial hysterectomy   APPENDECTOMY     as a child   BOTOX INJECTION  12/03/2015   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   CARDIAC CATHETERIZATION  11/21/10   CATARACT EXTRACTION, BILATERAL     CHOLECYSTECTOMY N/A 01/25/2016   Procedure: LAPAROSCOPIC CHOLECYSTECTOMY WITH INTRAOPERATIVE CHOLANGIOGRAM;  Surgeon: [name], MD;  Location: [facility];  Service: General;  Laterality: N/A;   COLONOSCOPY     COLOSTOMY RECONNECT     colostomy reversal  06/27/11   CORONARY ARTERY BYPASS GRAFT  11-25-2010   CABG X3; LIMA to LAD, SVG to PDA, SVG to OM, EVH via right thigh   ESOPHAGOGASTRODUODENOSCOPY     FLEXIBLE SIGMOIDOSCOPY  11/09/2011   Procedure: FLEXIBLE SIGMOIDOSCOPY;  Surgeon: Milus Banister, MD;  [facility];  Service: Endoscopy;  Laterality: N/A;   HEMORRHOID SURGERY  ~ 2005   x 2   OOPHORECTOMY  2005   left; w/"mass removal"   PROCTOSCOPY  06/27/2011   Procedure: PROCTOSCOPY;  Surgeon: Gayland Curry, MD;  Location: [place];  Service: General;  Laterality: N/A;   SIGMOIDECTOMY  12/18/2010   with end colostomy   TUBAL LIGATION       Current Meds  Medication Sig   acetaminophen (TYLENOL) 500 MG tablet Take 1,000 mg by mouth every 6 (six) hours as  needed for mild pain.   aspirin 81 MG tablet Take 81 mg by mouth daily.   Bacillus Coagulans-Inulin (PROBIOTIC-PREBIOTIC) 1-250 BILLION-MG CAPS Take 1 capsule by mouth daily.    Calcium Carb-Cholecalciferol (SM CALCIUM/VITAMIN D) 600-800 MG-UNIT TABS Take 1 tablet by mouth daily.   Coenzyme Q10 200 MG TABS Take 1 tablet by mouth daily.    ezetimibe (ZETIA) 10 MG tablet Take 1 tablet (10 mg total) by mouth daily.   furosemide (LASIX) 20 MG tablet Take 1 tablet (20 mg total) by mouth daily.   losartan (COZAAR) 50 MG tablet Take 1 tablet (50 mg total) by mouth daily.   mesalamine (APRISO) 0.375 g 24 hr capsule TAKE 2  BY MOUTH TWICE DAILY   Multiple Vitamin (MULTIVITAMIN) tablet Take 1 tablet  by mouth daily.    potassium chloride SA (K-DUR) 20 MEQ tablet Take 1 tablet (20 mEq total) by mouth daily.   pravastatin (PRAVACHOL) 80 MG tablet TAKE 1 TABLET BY MOUTH ONCE DAILY IN THE EVENING     Allergies:   Nadolol, Other, Sulfa antibiotics, Sulfamethoxazole, Sulfonamide derivatives, Verapamil, and Flagyl [metronidazole]   Social History   Tobacco Use   Smoking status: Never Smoker   Smokeless tobacco: Never Used  Substance Use Topics   Alcohol use: No   Drug use: No     Family Hx: The patient's family history includes Coronary artery disease in her mother; Heart attack (age of onset: 68) in her father; Heart disease in her father and mother. There is no history of Anesthesia problems, Hypotension, Malignant hyperthermia, Pseudochol deficiency, Colon cancer, Esophageal cancer, Stomach cancer, or Rectal cancer.  ROS:   Please see the history of present illness.    All other systems reviewed and are negative.   Prior CV studies:   The following studies were reviewed today:  Echo 07/17/18: IMPRESSIONS    1. The left ventricle has normal systolic function with an ejection fraction of 60-65%. The cavity size was normal. Left ventricular diastolic Doppler parameters are  consistent with pseudonormalization Elevated left atrial and left ventricular  end-diastolic pressures.  2. The right ventricle has normal systolic function. The cavity was normal. There is no increase in right ventricular wall thickness. Right ventricular systolic pressure is moderately elevated with an estimated pressure of 43.7 mmHg.  3. The mitral valve is degenerative. Moderate thickening of the mitral valve leaflets with moderate involvement of the chordae tendinae. Mild calcification of the mitral valve leaflet.  4. The tricuspid valve is normal in structure.  5. The aortic valve is tricuspid Aortic valve regurgitation is mild by color flow Doppler.  6. The pulmonic valve was normal in structure.  7. The inferior vena cava was dilated in size with <50% respiratory variability.  8. The interatrial septum appears to be lipomatous.   Myoview 07/19/2018 Study Highlights     The left ventricular ejection fraction is hyperdynamic (>65%).  Nuclear stress EF: 67%.  No T wave inversion was noted during stress.  There was no ST segment deviation noted during stress.  The study is normal.  This is a low risk study.  Normal perfusion. LVEF 67% with normal wall motion. This is a low risk study. No change compared to prior study in 2018.     Labs/Other Tests and Data Reviewed:    EKG:  No ECG reviewed.  Recent Labs: 01/24/2019: ALT 12; BUN 20; Creatinine, Ser 0.86; Hemoglobin 13.1; Platelets 127; Potassium 4.7; Sodium 137   Recent Lipid Panel Lab Results  Component Value Date/Time   CHOL 163 01/24/2019 09:09 AM   TRIG 90 01/24/2019 09:09 AM   HDL 58 01/24/2019 09:09 AM   CHOLHDL 2.8 01/24/2019 09:09 AM   CHOLHDL 3 04/13/2014 09:34 AM   LDLCALC 86 08/04/2016 09:26 AM   LDLDIRECT 81.8 07/20/2011 03:04 PM   Dated 07/02/18: cholesterol 161, triglycerides 81, HDL 52, LDL 93. CMET and TSH normal  Wt Readings from Last 3 Encounters:  01/30/19 139 lb 8 oz (63.3 kg)  11/25/18 140  lb 3.2 oz (63.6 kg)  09/18/18 136 lb 3.2 oz (61.8 kg)     Objective:    Vital Signs:  BP 134/73    Pulse 65    Ht 5' 6"  (1.676 m)    Wt 139 lb 8 oz (  63.3 kg)    BMI 22.52 kg/m    VITAL SIGNS:  reviewed  ASSESSMENT & PLAN:    1.  CAD s/p CABG x 3 in 2012 for multivessel CAD. Normal Myoview in May 2020.   2. Hypercholesterolemia. LDL is not at goal of < 70. Unable to tolerate more potent statins. Last LDL 93. Recently added Zetia.   3. Carotid arterial disease. 60-79% RICA stenosis. No change in Carotid dopplers 11/25/18.   4. HTN controlled.   5. Dyspnea. Extensive evaluation in the past. Cough did improve with switch from ACEi to ARB. Echo c/w diastolic dysfunction.  Low voltage on Ecg could be a sign of amyloid. SPEP was normal. Will increase lasix to 40 mg daily for 3 days to see if this will help her SOB. May want to consider PYP scan in future.    COVID-19 Education: The signs and symptoms of COVID-19 were discussed with the patient and how to seek care for testing (follow up with PCP or arrange E-visit).  The importance of social distancing was discussed today.  Time:   Today, I have spent 15 minutes with the patient with telehealth technology discussing the above problems.     Medication Adjustments/Labs and Tests Ordered: Current medicines are reviewed at length with the patient today.  Concerns regarding medicines are outlined above.   Tests Ordered: No orders of the defined types were placed in this encounter.   Medication Changes: No orders of the defined types were placed in this encounter.   Follow Up:  In Person in 4 month(s)  Signed, Taydem Cavagnaro Martinique, MD  01/30/2019 9:23 AM    [place]

## 2019-01-30 ENCOUNTER — Encounter: Payer: Self-pay | Admitting: Cardiology

## 2019-01-30 ENCOUNTER — Telehealth (INDEPENDENT_AMBULATORY_CARE_PROVIDER_SITE_OTHER): Payer: PPO | Admitting: Cardiology

## 2019-01-30 VITALS — BP 134/73 | HR 65 | Ht 66.0 in | Wt 139.5 lb

## 2019-01-30 DIAGNOSIS — I10 Essential (primary) hypertension: Secondary | ICD-10-CM

## 2019-01-30 DIAGNOSIS — I6523 Occlusion and stenosis of bilateral carotid arteries: Secondary | ICD-10-CM

## 2019-01-30 DIAGNOSIS — I2510 Atherosclerotic heart disease of native coronary artery without angina pectoris: Secondary | ICD-10-CM

## 2019-01-30 DIAGNOSIS — R0609 Other forms of dyspnea: Secondary | ICD-10-CM | POA: Diagnosis not present

## 2019-01-30 NOTE — Patient Instructions (Addendum)
Increase lasix to 40 mg daily for 3 days.  Continue other therapy  Follow up in 4 months    Call in mid Oct to schedule January appointment

## 2019-02-17 ENCOUNTER — Other Ambulatory Visit: Payer: Self-pay | Admitting: Gastroenterology

## 2019-02-19 DIAGNOSIS — Z23 Encounter for immunization: Secondary | ICD-10-CM | POA: Diagnosis not present

## 2019-03-24 ENCOUNTER — Other Ambulatory Visit: Payer: Self-pay | Admitting: Gastroenterology

## 2019-04-21 ENCOUNTER — Other Ambulatory Visit: Payer: Self-pay | Admitting: Gastroenterology

## 2019-05-26 ENCOUNTER — Other Ambulatory Visit: Payer: Self-pay | Admitting: Gastroenterology

## 2019-06-09 ENCOUNTER — Telehealth: Payer: Self-pay | Admitting: Cardiology

## 2019-06-09 NOTE — Telephone Encounter (Signed)
New Message:       Pt wants to know if she will need lab work before her appt with Dr Martinique on 06-19-19?

## 2019-06-09 NOTE — Telephone Encounter (Signed)
Spoke to patient, advised she is due for Lipid and Hepatic.   She states she will come Wednesday.  Aware this is fasting.  Patient verbalized understanding.

## 2019-06-10 NOTE — Progress Notes (Signed)
Date:  06/19/2019   ID:  Ranee, Peasley 1941/04/08, MRN [id number]   PCP:  Angelina Sheriff, MD  Cardiologist:  Davis Ambrosini Martinique, MD  Electrophysiologist:  None   Evaluation Performed:  Follow-Up Visit  Chief Complaint:  CAD  History of Present Illness:    [name] is a 79 y.o. 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 Roxy Manns (LIMA-LAD, SVG-OM, SVG-PDA), postop atrial fibrillation, spastic dysphonia, carotid artery disease, hypertension and hyperlipidemia.  Cardiopulmonary stress test in 2017 showed no cardiac or ventilatory limitation.  ETT March 2018 showed hypertensive response.  Previous Myoview in April 2018 was low risk but no ischemia or scar.  She is intolerant of Crestor.  Patient was last seen by Dr. Martinique on 07/08/2018 at which time she complained of increasing dyspnea on exertion going up hills.  This is similar to her previous symptoms prior to CABG.  Echocardiogram obtained on 07/15/2018 revealed EF 60 to 65%, mild AI, moderately elevated RV pressure. Repeat Myoview was performed on 07/19/2018 which showed EF 67%, normal perfusion.  Her dyspnea on exertion was felt to be related to diastolic heart failure and she was placed on low-dose Lasix and potassium supplement.  Losartan was increased to 100 mg daily.  She reports today that her breathing has improved and she has been able to walk up hills now. She has developed significant lower extremity pain recently. It hurts in her thighs, calves, and feet/toes.  Stopped taking pravastatin a week ago and it is a little better now. Had similar problems on Crestor in the past. Rare palpitations. No chest pain.    Past Medical History:  Diagnosis Date  . Anemia    "in my early teens"  . Arthritis    neck and back  . Atrial fibrillation ([place])    post-op after CABG;  treated with Amiodarone and coumadin until readmxn with CDiff colitis, perf colon and elevated LFTs (amio and coumadin stopped)  . C.  difficile colitis 05/624   complicated post CABG course with prolonged admission, perforated sigmoid colon;  s/p sigmoid colectomy with end colostomy [name] procedure);  laparotomy wound infection with Pseudomonas treated with antibxs and secondary intention  . Chronic kidney disease    hx kidney stones  . Coronary artery disease    a. NSTEMI 7/12, cath: pLAD 30%, mLAD 60-70%, dLAD 80%, oOM2 50%, mOM2 40%, trifurcation 80%, pRCA 25%, then occluded, L-R collats, EF 55%;   b. 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 Roxy Manns) // c. ETT 3/18: inf-lat ST depression of 1 mm at 4 min of exercise, frequent ectopy and hypertensive BP response // d. Myoview 4/18: EF 68, no ischemia; low risk   . 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 GERD (gastroesophageal reflux disease)   . H/O hiatal hernia    "slight; not having problems with it"  . Hair loss    after heart surgery  . Hemorrhoids    "none since hemorrhoid OR"  . History of bronchitis    late 1980's  . Hyperlipidemia    takes Pravastatin daily  . Hypertension    takes Metoprolol daily  . Mitral valve prolapse    was told this in the late 1990's; ;then with Dr.Mclean he states that she doesn't  . Myocardial infarction ([place]) 11/2010  . Palpitations 2003   hx of; Holter in 2003 with PACs and PVCs  . Pericarditis 1980  hx of  . 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 S/P colostomy ([place])    due to perf sigmoid colon in setting of CDiff colitis  . Seizures ([place])    "w/verapimil"  . Surgical wound infection    Pseudomonas; wound VAC; secondary intention  . 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 ABDOMINAL HYSTERECTOMY     partial hysterectomy  . APPENDECTOMY     as a child  . BOTOX INJECTION  12/03/2015   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  . CARDIAC CATHETERIZATION  11/21/10  . CATARACT EXTRACTION, BILATERAL    . CHOLECYSTECTOMY N/A 01/25/2016    Procedure: LAPAROSCOPIC CHOLECYSTECTOMY WITH INTRAOPERATIVE CHOLANGIOGRAM;  Surgeon: [name], MD;  Location: [facility];  Service: General;  Laterality: N/A;  . COLONOSCOPY    . COLOSTOMY RECONNECT    . colostomy reversal  06/27/11  . CORONARY ARTERY BYPASS GRAFT  11-25-2010   CABG X3; LIMA to LAD, SVG to PDA, SVG to OM, EVH via right thigh  . ESOPHAGOGASTRODUODENOSCOPY    . FLEXIBLE SIGMOIDOSCOPY  11/09/2011   Procedure: FLEXIBLE SIGMOIDOSCOPY;  Surgeon: Milus Banister, MD;  [facility];  Service: Endoscopy;  Laterality: N/A;  . HEMORRHOID SURGERY  ~ 2005   x 2  . OOPHORECTOMY  2005   left; w/"mass removal"  . PROCTOSCOPY  06/27/2011   Procedure: PROCTOSCOPY;  Surgeon: Gayland Curry, MD;  Location: [facility];  Service: General;  Laterality: N/A;  . SIGMOIDECTOMY  12/18/2010   with end colostomy  . TUBAL LIGATION       No outpatient medications have been marked as taking for the 06/19/19 encounter (Office Visit) with Martinique, Chlora Mcbain M, MD.     Allergies:   Nadolol, Other, Sulfa antibiotics, Sulfamethoxazole, Sulfonamide derivatives, Verapamil, Crestor [rosuvastatin], and Flagyl [metronidazole]   Social History   Tobacco Use  . Smoking status: Never Smoker  . Smokeless tobacco: Never Used  Substance Use Topics  . Alcohol use: No  . Drug use: No     Family Hx: The patient's family history includes Coronary artery disease in her mother; Heart attack (age of onset: 63) in her father; Heart disease in her father and mother. There is no history of Anesthesia problems, Hypotension, Malignant hyperthermia, Pseudochol deficiency, Colon cancer, Esophageal cancer, Stomach cancer, or Rectal cancer.  ROS:   Please see the history of present illness.    All other systems reviewed and are negative.   Prior CV studies:   The following studies were reviewed today:  Echo 07/17/18: IMPRESSIONS    1. The left ventricle has normal systolic function with an ejection fraction of 60-65%. The  cavity size was normal. Left ventricular diastolic Doppler parameters are consistent with pseudonormalization Elevated left atrial and left ventricular  end-diastolic pressures.  2. The right ventricle has normal systolic function. The cavity was normal. There is no increase in right ventricular wall thickness. Right ventricular systolic pressure is moderately elevated with an estimated pressure of 43.7 mmHg.  3. The mitral valve is degenerative. Moderate thickening of the mitral valve leaflets with moderate involvement of the chordae tendinae. Mild calcification of the mitral valve leaflet.  4. The tricuspid valve is normal in structure.  5. The aortic valve is tricuspid Aortic valve regurgitation is mild by color flow Doppler.  6. The pulmonic valve was normal in structure.  7. The inferior vena cava was dilated in size with <50%  respiratory variability.  8. The interatrial septum appears to be lipomatous.   Myoview 07/19/2018 Study Highlights     The left ventricular ejection fraction is hyperdynamic (>65%).  Nuclear stress EF: 67%.  No T wave inversion was noted during stress.  There was no ST segment deviation noted during stress.  The study is normal.  This is a low risk study.  Normal perfusion. LVEF 67% with normal wall motion. This is a low risk study. No change compared to prior study in 2018.     Labs/Other Tests and Data Reviewed:    EKG:  Ecg today shows NSR rate 61. Normal. I have personally reviewed and interpreted this study.   Recent Labs: 01/24/2019: BUN 20; Creatinine, Ser 0.86; Hemoglobin 13.1; Platelets 127; Potassium 4.7; Sodium 137 06/11/2019: ALT 14   Recent Lipid Panel Lab Results  Component Value Date/Time   CHOL 142 06/11/2019 09:17 AM   TRIG 82 06/11/2019 09:17 AM   HDL 57 06/11/2019 09:17 AM   CHOLHDL 2.5 06/11/2019 09:17 AM   CHOLHDL 3 04/13/2014 09:34 AM   LDLCALC 69 06/11/2019 09:17 AM   LDLDIRECT 81.8 07/20/2011 03:04 PM   Dated  07/02/18: cholesterol 161, triglycerides 81, HDL 52, LDL 93. CMET and TSH normal  Wt Readings from Last 3 Encounters:  06/19/19 140 lb (63.5 kg)  01/30/19 139 lb 8 oz (63.3 kg)  11/25/18 140 lb 3.2 oz (63.6 kg)     Objective:    Vital Signs:  BP (!) 152/78   Pulse 60   Temp (!) 97.3 F (36.3 C)   Ht 5' 6"  (1.676 m)   Wt 140 lb (63.5 kg)   SpO2 100%   BMI 22.60 kg/m    GENERAL:  Well appearing EF in NAD HEENT:  PERRL, EOMI, sclera are clear. Oropharynx is clear. NECK:  No jugular venous distention, carotid upstroke brisk and symmetric, no bruits, no thyromegaly or adenopathy LUNGS:  Clear to auscultation bilaterally CHEST:  Unremarkable HEART:  RRR,  PMI not displaced or sustained,S1 and S2 within normal limits, no S3, no S4: no clicks, no rubs, no murmurs ABD:  Soft, nontender. BS +, no masses or bruits. No hepatomegaly, no splenomegaly EXT:  2 + pulses throughout, no edema, no cyanosis no clubbing SKIN:  Warm and dry.  No rashes NEURO:  Alert and oriented x 3. Cranial nerves II through XII intact. PSYCH:  Cognitively intact    ASSESSMENT & PLAN:    1.  CAD s/p CABG x 3 in 2012 for multivessel CAD. Normal Myoview in May 2020. She is asymptomatic.   2. Hypercholesterolemia. LDL is now at goal on combination of Zetia and Pravastatin but it seems she has developed statin induced myalgias now. Off Pravastatin. Will continue Zetia. Repeat lipid panel in 3 months. May need to consider for PCSK 9 inhibitor.  3. Carotid arterial disease. 60-79% RICA stenosis. No change in Carotid dopplers 11/25/18.   4. HTN controlled.   5. Dyspnea. Extensive evaluation in the past. Cough did improve with switch from ACEi to ARB. Echo c/w diastolic dysfunction.  Clinically symptoms are improved.    Medication Adjustments/Labs and Tests Ordered: Current medicines are reviewed at length with the patient today.  Concerns regarding medicines are outlined above.   Tests Ordered: No orders of  the defined types were placed in this encounter.   Medication Changes: No orders of the defined types were placed in this encounter.   Follow Up:  6 months   Signed, Collier Salina  Martinique, MD  06/19/2019 10:01 AM    [facility]

## 2019-06-11 DIAGNOSIS — I2510 Atherosclerotic heart disease of native coronary artery without angina pectoris: Secondary | ICD-10-CM | POA: Diagnosis not present

## 2019-06-11 DIAGNOSIS — E785 Hyperlipidemia, unspecified: Secondary | ICD-10-CM | POA: Diagnosis not present

## 2019-06-11 LAB — HEPATIC FUNCTION PANEL
ALT: 14 IU/L (ref 0–32)
AST: 23 IU/L (ref 0–40)
Albumin: 4.3 g/dL (ref 3.7–4.7)
Alkaline Phosphatase: 66 IU/L (ref 39–117)
Bilirubin Total: 0.7 mg/dL (ref 0.0–1.2)
Bilirubin, Direct: 0.19 mg/dL (ref 0.00–0.40)
Total Protein: 7.1 g/dL (ref 6.0–8.5)

## 2019-06-11 LAB — LIPID PANEL
Chol/HDL Ratio: 2.5 ratio (ref 0.0–4.4)
Cholesterol, Total: 142 mg/dL (ref 100–199)
HDL: 57 mg/dL (ref >39)
LDL Chol Calc (NIH): 69 mg/dL (ref 0–99)
Triglycerides: 82 mg/dL (ref 0–149)
VLDL Cholesterol Cal: 16 mg/dL (ref 5–40)

## 2019-06-16 ENCOUNTER — Telehealth: Payer: Self-pay | Admitting: Cardiology

## 2019-06-16 NOTE — Telephone Encounter (Signed)
Patient states she is returning call requesting lab results. Please call.

## 2019-06-16 NOTE — Telephone Encounter (Signed)
Called patient, she states she spoke to [place] already.

## 2019-06-19 ENCOUNTER — Ambulatory Visit: Payer: PPO | Admitting: Cardiology

## 2019-06-19 ENCOUNTER — Encounter: Payer: Self-pay | Admitting: Cardiology

## 2019-06-19 ENCOUNTER — Other Ambulatory Visit: Payer: Self-pay

## 2019-06-19 VITALS — BP 152/78 | HR 60 | Temp 97.3°F | Ht 66.0 in | Wt 140.0 lb

## 2019-06-19 DIAGNOSIS — I6523 Occlusion and stenosis of bilateral carotid arteries: Secondary | ICD-10-CM

## 2019-06-19 DIAGNOSIS — E785 Hyperlipidemia, unspecified: Secondary | ICD-10-CM | POA: Diagnosis not present

## 2019-06-19 DIAGNOSIS — R0600 Dyspnea, unspecified: Secondary | ICD-10-CM

## 2019-06-19 DIAGNOSIS — I10 Essential (primary) hypertension: Secondary | ICD-10-CM

## 2019-06-19 DIAGNOSIS — I2510 Atherosclerotic heart disease of native coronary artery without angina pectoris: Secondary | ICD-10-CM

## 2019-06-19 DIAGNOSIS — R0609 Other forms of dyspnea: Secondary | ICD-10-CM

## 2019-06-19 NOTE — Addendum Note (Signed)
Addended by: Kathyrn Lass on: 06/19/2019 10:09 AM   Modules accepted: Orders

## 2019-06-19 NOTE — Addendum Note (Signed)
Addended by: Kathyrn Lass on: 06/19/2019 10:10 AM   Modules accepted: Orders

## 2019-06-23 ENCOUNTER — Other Ambulatory Visit: Payer: Self-pay | Admitting: Gastroenterology

## 2019-06-23 NOTE — Telephone Encounter (Signed)
Okay to refill for 1 month with 11 refills.  I have not seen her in quite a while.  Please arrange my next available return office visit as well.

## 2019-06-23 NOTE — Telephone Encounter (Signed)
Is it ok to refill mesalamine?  If so, how many refills?  Thank you

## 2019-07-04 DIAGNOSIS — J385 Laryngeal spasm: Secondary | ICD-10-CM | POA: Diagnosis not present

## 2019-07-29 ENCOUNTER — Ambulatory Visit: Payer: PPO | Admitting: Gastroenterology

## 2019-07-29 ENCOUNTER — Encounter: Payer: Self-pay | Admitting: Gastroenterology

## 2019-07-29 VITALS — BP 140/70 | HR 76 | Temp 97.4°F | Ht 66.0 in | Wt 140.0 lb

## 2019-07-29 DIAGNOSIS — K51919 Ulcerative colitis, unspecified with unspecified complications: Secondary | ICD-10-CM

## 2019-07-29 DIAGNOSIS — K913 Postprocedural intestinal obstruction, unspecified as to partial versus complete: Secondary | ICD-10-CM

## 2019-07-29 MED ORDER — MESALAMINE ER 0.375 G PO CP24: 0.7500 g | ORAL_CAPSULE | Freq: Two times a day (BID) | ORAL | 11 refills | Status: DC

## 2019-07-29 NOTE — Progress Notes (Signed)
Review of pertinent gastrointestinal problems:  1. C. Diff coltis, eventually perforated sigmoid,s/p segmental colectomy, reversed 2/13.  2. 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 She underwent 3-4 sequential sigmoidoscopies with dilation of the stricture. Eventually dilated up to 20 mm with very good results in her diarrhea. Last examination was August 2013, this was a full colonoscopy. No polyps were seen. She did have proximal to the site of the previous stricture, the colon was somewhat edematous.  3. routine risk for colon cancer,next colonoscopy August 2023  4. Chronic colitis,July 2014 colonoscopy (mildly inflamed colon throughout, biopsies confirmed chronic inflammation). This was done for diarrhea. CC anastomosis at the time was open to 65m, repeat dilation performed. Pred 276mdaily was started. Hot flashes, racing heart and so changed to uceris 44m5mer day. Sept 2014; started mesalamine 4.8 grams per day, with good response (apriso)   HPI: This is a very pleasant 79 43ar old woman whom I last saw about 2 years ago.  More recently she was here and she saw PauBroadwayShe is doing well on her chronic oral mesalamine.  She takes 2 Apriso pills twice daily.  On that regimen she has 2-3 soft formed bowel movements daily.  She has no troubles with fecal incontinence, no trouble with bleeding, no significant diarrhea, no urgency.  She is had no abdominal pains.  Her weight is overall stable.  ROS: complete GI ROS as described in HPI, all other review negative.  Constitutional:  No unintentional weight loss   Past Medical History:  Diagnosis Date  . Anemia    "in my early teens"  . Arthritis    neck and back  . Atrial [place] ([facility]  post-op after CABG;  treated with Amiodarone and coumadin until readmxn with CDiff colitis, perf colon and elevated LFTs (amio and coumadin stopped)  . C. difficile colitis 8/29/4174complicated post  CABG course with prolonged admission, perforated sigmoid colon;  s/p sigmoid colectomy with end colostomy (HaJeanette Capriceocedure);  laparotomy wound infection with Pseudomonas treated with antibxs and secondary intention  . Chronic kidney disease    hx kidney stones  . Coronary artery disease    a. NSTEMI 7/12, cath: pLAD 30%, mLAD 60-70%, dLAD 80%, oOM2 50%, mOM2 40%, trifurcation 80%, pRCA 25%, then occluded, L-R collats, EF 55%;   b. 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 OweRoxy Manns/ c. ETT 3/18: inf-lat ST depression of 1 mm at 4 min of exercise, frequent ectopy and hypertensive BP response // d. Myoview 4/18: EF 68, no ischemia; low risk   . 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 GERD (gastroesophageal reflux disease)   . H/O hiatal hernia    "slight; not having problems with it"  . Hair loss    after heart surgery  . Hemorrhoids    "none since hemorrhoid OR"  . History of bronchitis    late 1980's  . Hyperlipidemia    takes Pravastatin daily  . Hypertension    takes Metoprolol daily  . Mitral valve prolapse    was told this in the late 1990's; ;then with Dr.Mclean he states that she doesn't  . Myocardial infarction ([facility]/2012  . Palpitations 2003   hx of; Holter in 2003 with PACs and PVCs  . Pericarditis 1980   hx of  . 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 S/P colostomy ([facility]  due to perf sigmoid colon  in setting of CDiff colitis  . Seizures ([place])    "w/verapimil"  . Surgical wound infection    Pseudomonas; wound VAC; secondary intention  . 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 ABDOMINAL HYSTERECTOMY     partial hysterectomy  . APPENDECTOMY     as a child  . BOTOX INJECTION  12/03/2015   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  . CARDIAC CATHETERIZATION  11/21/10  . CATARACT EXTRACTION, BILATERAL    . CHOLECYSTECTOMY N/A 01/25/2016   Procedure: LAPAROSCOPIC CHOLECYSTECTOMY  WITH INTRAOPERATIVE CHOLANGIOGRAM;  Surgeon: [name], MD;  [facility];  Service: General;  Laterality: N/A;  . COLONOSCOPY    . COLOSTOMY RECONNECT    . colostomy reversal  06/27/11  . CORONARY ARTERY BYPASS GRAFT  11-25-2010   CABG X3; LIMA to LAD, SVG to PDA, SVG to OM, EVH via right thigh  . ESOPHAGOGASTRODUODENOSCOPY    . FLEXIBLE SIGMOIDOSCOPY  11/09/2011   Procedure: FLEXIBLE SIGMOIDOSCOPY;  Surgeon: Milus Banister, MD;  [facility];  Service: Endoscopy;  Laterality: N/A;  . HEMORRHOID SURGERY  ~ 2005   x 2  . OOPHORECTOMY  2005   left; w/"mass removal"  . PROCTOSCOPY  06/27/2011   Procedure: PROCTOSCOPY;  Surgeon: Gayland Curry, MD;  Location: [facility];  Service: General;  Laterality: N/A;  . SIGMOIDECTOMY  12/18/2010   with end colostomy  . TUBAL LIGATION      Current Outpatient Medications  Medication Sig Dispense Refill  . acetaminophen (TYLENOL) 500 MG tablet Take 1,000 mg by mouth every 6 (six) hours as needed for mild pain.    Marland Kitchen ascorbic acid (VITAMIN C) 1000 MG tablet Take by mouth.    Marland Kitchen aspirin 81 MG tablet Take 81 mg by mouth daily.    . Bacillus Coagulans-Inulin (PROBIOTIC-PREBIOTIC) 1-250 BILLION-MG CAPS Take 1 capsule by mouth daily.     . Calcium Carb-Cholecalciferol (SM CALCIUM/VITAMIN D) 600-800 MG-UNIT TABS Take 1 tablet by mouth daily.    . Coenzyme Q10 200 MG TABS Take 1 tablet by mouth daily.   0  . mesalamine (APRISO) 0.375 g 24 hr capsule Take 2 capsules by mouth twice daily 120 capsule 11  . Multiple Vitamin (MULTI-VITAMIN) tablet Take by mouth.    . potassium chloride SA (K-DUR) 20 MEQ tablet Take 1 tablet (20 mEq total) by mouth daily. 30 tablet 6  . ezetimibe (ZETIA) 10 MG tablet Take 1 tablet (10 mg total) by mouth daily. 30 tablet 6  . furosemide (LASIX) 20 MG tablet Take 1 tablet (20 mg total) by mouth daily. 30 tablet 6  . losartan (COZAAR) 50 MG tablet Take 1 tablet (50 mg total) by mouth daily. 90 tablet 3   No current  facility-administered medications for this visit.    Allergies as of 07/29/2019 - Review Complete 07/29/2019  Allergen Reaction Noted  . Nadolol Hives and Anaphylaxis 08/02/2009  . Other Other (See Comments) and Anaphylaxis 06/22/2011  . Sulfa antibiotics Hives 11/20/2011  . Sulfamethoxazole Anaphylaxis 05/03/2015  . Sulfonamide derivatives Anaphylaxis   . Verapamil Other (See Comments) and Anaphylaxis   . Crestor [rosuvastatin] Other (See Comments) 06/19/2019  . Flagyl [metronidazole] Hives 04/30/2018    Family History  Problem Relation Age of Onset  . Heart attack Father 61  . Heart disease Father        massive heart attack  . Coronary artery disease Mother  had bypass in [place] pass  . Heart disease Mother   . Anesthesia problems Neg Hx   . Hypotension Neg Hx   . Malignant hyperthermia Neg Hx   . Pseudochol deficiency Neg Hx   . Colon cancer Neg Hx   . Esophageal cancer Neg Hx   . Stomach cancer Neg Hx   . Rectal cancer Neg Hx     Social History   Socioeconomic History  . Marital status: Married    Spouse name: Not on file  . Number of children: 1  . Years of education: Not on file  . Highest education level: Not on file  Occupational History  . Occupation: LPN    Employer: [employer]  Tobacco Use  . Smoking status: Never Smoker  . Smokeless tobacco: Never Used  Substance and Sexual Activity  . Alcohol use: No  . Drug use: No  . Sexual activity: Yes    Birth control/protection: Post-menopausal  Other Topics Concern  . Not on file  Social History Narrative  . Not on file   Social Determinants of Health   Financial Resource Strain:   . Difficulty of Paying Living Expenses:   Food Insecurity:   . Worried About Charity fundraiser in the Last Year:   . Arboriculturist in the Last Year:   Transportation Needs:   . Film/video editor (Medical):   Marland Kitchen Lack of Transportation (Non-Medical):   Physical Activity:   . Days of Exercise per Week:    . Minutes of Exercise per Session:   Stress:   . Feeling of Stress :   Social Connections:   . Frequency of Communication with Friends and Family:   . Frequency of Social Gatherings with Friends and Family:   . Attends Religious Services:   . Active Member of Clubs or Organizations:   . Attends Archivist Meetings:   Marland Kitchen Marital Status:   Intimate Partner Violence:   . Fear of Current or Ex-Partner:   . Emotionally Abused:   Marland Kitchen Physically Abused:   . Sexually Abused:      Physical Exam: BP 140/70   Pulse 76   Temp (!) 97.4 F (36.3 C)   Ht 5' 6"  (1.676 m)   Wt 140 lb (63.5 kg)   BMI 22.60 kg/m  Constitutional: generally well-appearing Psychiatric: alert and oriented x3 Abdomen: soft, nontender, nondistended, no obvious ascites, no peritoneal signs, normal bowel sounds No peripheral edema noted in lower extremities  Assessment and plan: 79 y.o. female with history of sigmoid anastomotic stricture, mild ulcerative colitis  She is doing very well on Apriso 2 pills twice daily.  She has no symptoms of recurrent anastomotic stricture which presented with significant diarrhea, urgency.  She is having no overt GI bleeding.  No diarrhea.  We discussed maybe trying to taper her Apriso to 2 pills once daily and she would like to do that.  She will call to report on her response in 2 or 3 months and I would at least like to see her again in 1 year.  I'm calling in a new prescription for her Apriso with 1 year of refills now.  Please see the "Patient Instructions" section for addition details about the plan.  Owens Loffler, MD [facility] 07/29/2019, 10:56 AM   Total time on date of encounter was 30 minutes (this included time spent preparing to see the patient reviewing records; obtaining and/or reviewing separately obtained history; performing a medically appropriate  exam and/or evaluation; counseling and educating the patient and family if present; ordering  medications, tests or procedures if applicable; and documenting clinical information in the health record).

## 2019-07-29 NOTE — Patient Instructions (Signed)
We have sent the following medications to your pharmacy for you to pick up at your convenience: Apriso- Take 2 tablets daily (we will send prescription for 2 tablets twice daily in case you need to change dosing. However, if you need to do this, please let us know)  Call our office in 2-3 months with an update on how you are doing with the decreased dosage of Apriso.  Please follow up with Dr Ardis Hughs in 1 year.  If you are age 77 or older, your body mass index should be between 23-30. Your Body mass index is 22.6 kg/m. If this is out of the aforementioned range listed, please consider follow up with your Primary Care Provider.  If you are age 89 or younger, your body mass index should be between 19-25. Your Body mass index is 22.6 kg/m. If this is out of the aformentioned range listed, please consider follow up with your Primary Care Provider.

## 2019-08-18 ENCOUNTER — Telehealth: Payer: Self-pay | Admitting: Cardiology

## 2019-08-18 ENCOUNTER — Other Ambulatory Visit: Payer: Self-pay

## 2019-08-18 MED ORDER — FUROSEMIDE 20 MG PO TABS: 20.0000 mg | ORAL_TABLET | Freq: Every day | ORAL | 3 refills | Status: DC

## 2019-08-18 MED ORDER — EZETIMIBE 10 MG PO TABS: 10.0000 mg | ORAL_TABLET | Freq: Every day | ORAL | 3 refills | Status: DC

## 2019-08-18 MED ORDER — POTASSIUM CHLORIDE CRYS ER 20 MEQ PO TBCR: 20.0000 meq | EXTENDED_RELEASE_TABLET | Freq: Every day | ORAL | 3 refills | Status: DC

## 2019-08-18 NOTE — Telephone Encounter (Signed)
Called and spoke with pt. Notified that the refill for her zetia was sent to her preferred pharmacy for a 90 day supply.  Pt asked if we could change her lasix and her potassium to 90 day supplies as well. Stated she did not need them right now but she would be out of her lasix in 1 week and her potassium in 11days.  Notified I would route to Dr.Jordan's nurse to make sure this was okay. Pt verbalized understanding with no other questions at this time.

## 2019-08-18 NOTE — Progress Notes (Signed)
Refill for zetia sent to preferred pharmacy

## 2019-08-18 NOTE — Telephone Encounter (Signed)
Spoke to patient she stated she would like 90 day refills for lasix and potassium.90 day refills sent to pharmacy.

## 2019-08-18 NOTE — Telephone Encounter (Signed)
New Message    *STAT* If patient is at the pharmacy, call can be transferred to refill team.   1. Which medications need to be refilled? (please list name of each medication and dose if known) ezetimibe (ZETIA) 10 MG tablet(Expired)  2. Which pharmacy/location (including street and city if local pharmacy) is medication to be sent to? [place]  3. Do they need a 30 day or 90 day supply? [place]

## 2019-08-21 ENCOUNTER — Other Ambulatory Visit: Payer: Self-pay | Admitting: *Deleted

## 2019-08-21 DIAGNOSIS — I6523 Occlusion and stenosis of bilateral carotid arteries: Secondary | ICD-10-CM

## 2019-08-25 ENCOUNTER — Ambulatory Visit (HOSPITAL_COMMUNITY)
Admission: RE | Admit: 2019-08-25 | Discharge: 2019-08-25 | Disposition: A | Discharge status: Home / Self Care | Payer: PPO | Source: Ambulatory Visit | Attending: Surgery | Admitting: Surgery

## 2019-08-25 ENCOUNTER — Other Ambulatory Visit: Payer: Self-pay

## 2019-08-25 ENCOUNTER — Ambulatory Visit: Payer: PPO | Admitting: Physician Assistant

## 2019-08-25 VITALS — BP 145/94 | HR 57 | Temp 97.3°F | Ht 66.0 in | Wt 141.0 lb

## 2019-08-25 DIAGNOSIS — I6523 Occlusion and stenosis of bilateral carotid arteries: Secondary | ICD-10-CM | POA: Insufficient documentation

## 2019-08-25 NOTE — Progress Notes (Signed)
History of Present Illness:  Patient is a 79 y.o. year old female who presents for evaluation of asymptomatic carotid stenosis.  She had a duplex in 2012 which revealed moderate bilateral stenosis[place] [facility] [place] greater than 70% right carotid stenosis.   She denies any known history of stroke or TIA. Specificallyshe deniesa history of amaurosis fugax or monocular blindness, unilateral facial drooping, hemiplegia, orreceptive or expressive aphasia.  She has a hx of CAD with STEMI in 2012 and underwent CABG. She developed Afib postoperatively, which was treated with amiodarone and coumadin.  Pt meds include: Statin :yes ASA:yes Other anticoagulants/antiplatelets:no  Past Medical History:  Diagnosis Date  . Anemia    "in my early teens"  . Arthritis    neck and back  . Atrial fibrillation ([place])    post-op after CABG;  treated with Amiodarone and coumadin until readmxn with CDiff colitis, perf colon and elevated LFTs (amio and coumadin stopped)  . C. difficile colitis 12/4164   complicated post CABG course with prolonged admission, perforated sigmoid colon;  s/p sigmoid colectomy with end colostomy [name] procedure);  laparotomy wound infection with Pseudomonas treated with antibxs and secondary intention  . Chronic kidney disease    hx kidney stones  . Coronary artery disease    a. NSTEMI 7/12, cath: pLAD 30%, mLAD 60-70%, dLAD 80%, oOM2 50%, mOM2 40%, trifurcation 80%, pRCA 25%, then occluded, L-R collats, EF 55%;   b. 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 Roxy Manns) // c. ETT 3/18: inf-lat ST depression of 1 mm at 4 min of exercise, frequent ectopy and hypertensive BP response // d. Myoview 4/18: EF 68, no ischemia; low risk   . 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 GERD (gastroesophageal reflux disease)   . H/O hiatal hernia    "slight; not having problems with it"  . Hair loss    after heart surgery  .  Hemorrhoids    "none since hemorrhoid OR"  . History of bronchitis    late 1980's  . Hyperlipidemia    takes Pravastatin daily  . Hypertension    takes Metoprolol daily  . Mitral valve prolapse    was told this in the late 1990's; ;then with Dr.Mclean he states that she doesn't  . Myocardial infarction ([place]) 11/2010  . Palpitations 2003   hx of; Holter in 2003 with PACs and PVCs  . Pericarditis 1980   hx of  . 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 S/P colostomy ([place])    due to perf sigmoid colon in setting of CDiff colitis  . Seizures ([place])    "w/verapimil"  . Surgical wound infection    Pseudomonas; wound VAC; secondary intention  . 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 ABDOMINAL HYSTERECTOMY     partial hysterectomy  . APPENDECTOMY     as a child  . BOTOX INJECTION  12/03/2015   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  . CARDIAC CATHETERIZATION  11/21/10  . CATARACT EXTRACTION, BILATERAL    . CHOLECYSTECTOMY N/A 01/25/2016   Procedure: LAPAROSCOPIC CHOLECYSTECTOMY WITH INTRAOPERATIVE CHOLANGIOGRAM;  Surgeon: [name], MD;  [facility];  Service: General;  Laterality: N/A;  . COLONOSCOPY    . COLOSTOMY RECONNECT    . colostomy reversal  06/27/11  . CORONARY ARTERY BYPASS GRAFT  11-25-2010   CABG X3;  LIMA to LAD, SVG to PDA, SVG to OM, EVH via right thigh  . ESOPHAGOGASTRODUODENOSCOPY    . FLEXIBLE SIGMOIDOSCOPY  11/09/2011   Procedure: FLEXIBLE SIGMOIDOSCOPY;  Surgeon: Milus Banister, MD;  [facility];  Service: Endoscopy;  Laterality: N/A;  . HEMORRHOID SURGERY  ~ 2005   x 2  . OOPHORECTOMY  2005   left; w/"mass removal"  . PROCTOSCOPY  06/27/2011   Procedure: PROCTOSCOPY;  Surgeon: Gayland Curry, MD;  Location: [facility];  Service: General;  Laterality: N/A;  . SIGMOIDECTOMY  12/18/2010   with end colostomy  . TUBAL LIGATION       Social History Social History   Tobacco Use  . Smoking  status: Never Smoker  . Smokeless tobacco: Never Used  Substance Use Topics  . Alcohol use: No  . Drug use: No    Family History Family History  Problem Relation Age of Onset  . Heart attack Father 85  . Heart disease Father        massive heart attack  . Coronary artery disease Mother        had [place]  . Heart disease Mother   . Anesthesia problems Neg Hx   . Hypotension Neg Hx   . Malignant hyperthermia Neg Hx   . Pseudochol deficiency Neg Hx   . Colon cancer Neg Hx   . Esophageal cancer Neg Hx   . Stomach cancer Neg Hx   . Rectal cancer Neg Hx     Allergies  Allergies  Allergen Reactions  . Nadolol Hives and Anaphylaxis    Rash and nausea  . Other Other (See Comments) and Anaphylaxis    apples Apples-anaphylaxis  . Sulfa Antibiotics Hives  . Sulfamethoxazole Anaphylaxis  . Sulfonamide Derivatives Anaphylaxis  . Verapamil Other (See Comments) and Anaphylaxis    seizures  . Crestor [Rosuvastatin] Other (See Comments)    Myalgias   . Flagyl [Metronidazole] Hives     Current Outpatient Medications  Medication Sig Dispense Refill  . acetaminophen (TYLENOL) 500 MG tablet Take 1,000 mg by mouth every 6 (six) hours as needed for mild pain.    Marland Kitchen ascorbic acid (VITAMIN C) 1000 MG tablet Take by mouth.    Marland Kitchen aspirin 81 MG tablet Take 81 mg by mouth daily.    . Bacillus Coagulans-Inulin (PROBIOTIC-PREBIOTIC) 1-250 BILLION-MG CAPS Take 1 capsule by mouth daily.     . Calcium Carb-Cholecalciferol (SM CALCIUM/VITAMIN D) 600-800 MG-UNIT TABS Take 1 tablet by mouth daily.    . Coenzyme Q10 200 MG TABS Take 1 tablet by mouth daily.   0  . ezetimibe (ZETIA) 10 MG tablet Take 1 tablet (10 mg total) by mouth daily. 90 tablet 3  . furosemide (LASIX) 20 MG tablet Take 1 tablet (20 mg total) by mouth daily. 90 tablet 3  . losartan (COZAAR) 50 MG tablet Take 1 tablet (50 mg total) by mouth daily. 90 tablet 3  . mesalamine (APRISO) 0.375 g 24 hr capsule Take 2 capsules  (0.75 g total) by mouth 2 (two) times daily. (Patient taking differently: Take 0.375 g by mouth 2 (two) times daily. ) 120 capsule 11  . Multiple Vitamin (MULTI-VITAMIN) tablet Take by mouth.    . potassium chloride SA (KLOR-CON) 20 MEQ tablet Take 1 tablet (20 mEq total) by mouth daily. 90 tablet 3   No current facility-administered medications for this visit.    ROS:   General:  No weight loss, Fever, chills  HEENT: No recent headaches, no nasal bleeding, no visual changes, no sore throat  Neurologic: No dizziness, blackouts, seizures. No recent symptoms of stroke or mini- stroke. No recent episodes of slurred speech, or temporary blindness.  Cardiac: No recent episodes of chest pain/pressure, no shortness of breath at rest.  No shortness of breath with exertion.  Denies history of atrial fibrillation or irregular heartbeat  Vascular: No history of rest pain in feet.  No history of claudication.  No history of non-healing ulcer, No history of DVT   Pulmonary: No home oxygen, no productive cough, no hemoptysis,  No asthma or wheezing  Musculoskeletal:  [x ] Arthritis, [ ]  Low back pain,  [ ]  Joint pain  Hematologic:No history of hypercoagulable state.  No history of easy bleeding.  No history of anemia  Gastrointestinal: No hematochezia or melena,  No gastroesophageal reflux, no trouble swallowing  Urinary: [ ]  chronic Kidney disease, [ ]  on HD - [ ]  MWF or [ ]  TTHS, [ ]  Burning with urination, [ ]  Frequent urination, [ ]  Difficulty urinating;   Skin: No rashes  Psychological: No history of anxiety,  No history of depression   Physical Examination  Vitals:   08/25/19 1035 08/25/19 1040  BP: 134/66 (!) 145/94  Pulse: (!) 57   Temp: (!) 97.3 F (36.3 C)   TempSrc: Temporal   SpO2: 97%   Weight: 141 lb (64 kg)   Height: 5' 6"  (1.676 m)     Body mass index is 22.76 kg/m.  General:  Alert and oriented, no acute distress HEENT: Normal Neck: No bruit or JVD Pulmonary:  Clear to auscultation bilaterally Cardiac: Regular Rate and Rhythm without murmur Gastrointestinal: Soft, non-tender, non-distended, no mass, no scars Skin: No rash Extremity Pulses:  2+ radial, brachial, femoral, dorsalis pedis, posterior tibial pulses bilaterally Musculoskeletal: No deformity or edema  Neurologic: Upper and lower extremity motor 5/5 and symmetric  DATA:  Right Carotid Findings:  +----------+--------+--------+--------+--------------------------+--------+        PSV cm/sEDV cm/sStenosisPlaque Description      Comments  +----------+--------+--------+--------+--------------------------+--------+   CCA Prox 80   15                            +----------+--------+--------+--------+--------------------------+--------+   CCA Mid  74   15                            +----------+--------+--------+--------+--------------------------+--------+   CCA Distal70   17   <50%  calcific                 +----------+--------+--------+--------+--------------------------+--------+   ICA Prox 223   66   60-79% irregular and heterogenous        +----------+--------+--------+--------+--------------------------+--------+   ICA Mid  116   25                            +----------+--------+--------+--------+--------------------------+--------+   ICA Distal86   21                            +----------+--------+--------+--------+--------------------------+--------+   ECA    125   0        heterogenous               +----------+--------+--------+--------+--------------------------+--------+    +----------+--------+-------+----------------+-------------------+       PSV cm/sEDV cmsDescribe  Arm Pressure (mmHG)    +----------+--------+-------+----------------+-------------------+  [id number]   0   Multiphasic, WNL            +----------+--------+-------+----------------+-------------------+   +---------+--------+--+--------+--+---------+  VertebralPSV cm/s64EDV cm/s11Antegrade  +---------+--------+--+--------+--+---------+      Left Carotid Findings:  +----------+--------+--------+--------+----------------------+--------+       PSV cm/sEDV cm/sStenosisPlaque Description  Comments  +----------+--------+--------+--------+----------------------+--------+  CCA Prox 114   18                        +----------+--------+--------+--------+----------------------+--------+  CCA Mid  81   22                        +----------+--------+--------+--------+----------------------+--------+  CCA Distal72   18                        +----------+--------+--------+--------+----------------------+--------+  ICA Prox 93   25   1-39%  homogeneous and smooth      +----------+--------+--------+--------+----------------------+--------+  ICA Mid  113   31                        +----------+--------+--------+--------+----------------------+--------+  ICA Distal99   23                        +----------+--------+--------+--------+----------------------+--------+  ECA    69   0                         +----------+--------+--------+--------+----------------------+--------+   +----------+--------+--------+----------------+-------------------+       PSV cm/sEDV cm/sDescribe    Arm Pressure (mmHG)  +----------+--------+--------+----------------+-------------------+  Subclavian122   0    Multiphasic, WNL             +----------+--------+--------+----------------+-------------------+   +---------+--------+--+--------+--+---------+  VertebralPSV cm/s55EDV cm/s13Antegrade  +---------+--------+--+--------+--+---------+        Summary:  Right Carotid: Velocities in the right ICA are consistent with a 60-79%         stenosis. Non-hemodynamically significant plaque <50% noted in  the CCA.           Left Carotid: Velocities in the left ICA are consistent with a 1-39%  stenosis.   ASSESSMENT:  Asymptomatic carotid stenosis right  60-79% stenosis without significant change from 9 months ago.  She denise symptoms of stroke or TIA.  PLAN: She will f/u in 6 months for repeat duplex.  We reviewed symptoms of stroke and if she develops any she will call 911.    Roxy Horseman PA-C [facility] Office: [phone]  MD in clinic [place]

## 2019-08-26 ENCOUNTER — Other Ambulatory Visit: Payer: Self-pay | Admitting: *Deleted

## 2019-08-26 DIAGNOSIS — I6523 Occlusion and stenosis of bilateral carotid arteries: Secondary | ICD-10-CM

## 2019-09-30 DIAGNOSIS — I2510 Atherosclerotic heart disease of native coronary artery without angina pectoris: Secondary | ICD-10-CM | POA: Diagnosis not present

## 2019-09-30 DIAGNOSIS — E785 Hyperlipidemia, unspecified: Secondary | ICD-10-CM | POA: Diagnosis not present

## 2019-09-30 DIAGNOSIS — I6523 Occlusion and stenosis of bilateral carotid arteries: Secondary | ICD-10-CM | POA: Diagnosis not present

## 2019-09-30 DIAGNOSIS — I10 Essential (primary) hypertension: Secondary | ICD-10-CM | POA: Diagnosis not present

## 2019-09-30 DIAGNOSIS — R0600 Dyspnea, unspecified: Secondary | ICD-10-CM | POA: Diagnosis not present

## 2019-09-30 LAB — LIPID PANEL
Chol/HDL Ratio: 3.3 ratio (ref 0.0–4.4)
Cholesterol, Total: 181 mg/dL (ref 100–199)
HDL: 55 mg/dL (ref >39)
LDL Chol Calc (NIH): 106 mg/dL — ABNORMAL HIGH (ref 0–99)
Triglycerides: 113 mg/dL (ref 0–149)
VLDL Cholesterol Cal: 20 mg/dL (ref 5–40)

## 2019-10-07 ENCOUNTER — Other Ambulatory Visit: Payer: Self-pay

## 2019-10-07 ENCOUNTER — Telehealth: Payer: Self-pay | Admitting: Cardiology

## 2019-10-07 DIAGNOSIS — Z79899 Other long term (current) drug therapy: Secondary | ICD-10-CM

## 2019-10-07 DIAGNOSIS — E785 Hyperlipidemia, unspecified: Secondary | ICD-10-CM

## 2019-10-07 NOTE — Telephone Encounter (Signed)
Patient calling stating she spoke with Malachy Mood about seeing a pharmacist and has decided she would like to see one.

## 2019-10-07 NOTE — Telephone Encounter (Signed)
Spoke with patient and scheduled her for first available June 29

## 2019-10-17 ENCOUNTER — Telehealth: Payer: Self-pay | Admitting: Gastroenterology

## 2019-10-17 NOTE — Telephone Encounter (Signed)
Patient called wanted to follow up with Dr. Ardis Hughs and let him know she is doing well

## 2019-10-17 NOTE — Telephone Encounter (Signed)
The pt wanted to update Dr Ardis Hughs and let him know she is doing very well and will call if needed.

## 2019-11-10 DIAGNOSIS — H35373 Puckering of macula, bilateral: Secondary | ICD-10-CM | POA: Diagnosis not present

## 2019-11-11 ENCOUNTER — Ambulatory Visit (INDEPENDENT_AMBULATORY_CARE_PROVIDER_SITE_OTHER): Payer: PPO | Admitting: Pharmacist Clinician (PhC)/ Clinical Pharmacy Specialist

## 2019-11-11 ENCOUNTER — Other Ambulatory Visit: Payer: Self-pay

## 2019-11-11 ENCOUNTER — Encounter: Payer: Self-pay | Admitting: Pharmacist Clinician (PhC)/ Clinical Pharmacy Specialist

## 2019-11-11 DIAGNOSIS — T466X5A Adverse effect of antihyperlipidemic and antiarteriosclerotic drugs, initial encounter: Secondary | ICD-10-CM | POA: Diagnosis not present

## 2019-11-11 DIAGNOSIS — G720 Drug-induced myopathy: Secondary | ICD-10-CM | POA: Diagnosis not present

## 2019-11-11 DIAGNOSIS — E785 Hyperlipidemia, unspecified: Secondary | ICD-10-CM

## 2019-11-11 NOTE — Patient Instructions (Addendum)
Your Results:             Your most recent labs Goal  Total Cholesterol 181 < 200  Triglycerides 113 < 150  HDL (happy/good cholesterol) 55 > 40  LDL (lousy/bad cholesterol  < 70   < 100   Medication changes:   Please call with your decision on which medication you would prefer to take.     [name] at [phone]    Patient Assistance:  The [employer] foundation offers assistance to help pay for medication copays.  They will cover copays for all cholesterol lowering meds, including statins, fibrates, omega-3 oils, ezetimibe, Repatha, Praluent, Nexletol, Nexlizet.  The cards are usually good for $2,500 or 12 months, whichever comes first. 1. Go to healthwellfoundation.org 2. Click on "Apply Now" 3. Answer questions as to whom is applying (patient or representative) 4. Your disease fund will be "hypercholesterolemia - Medicare access" 5. They will ask questions about finances and which medications you are taking for cholesterol 6. When you submit, the approval is usually within minutes.  You will need to print the card information from the site 7. You will need to show this information to your pharmacy, they will bill your Medicare Part D plan first -then bill [employer] --for the copay.   You can also call them at [phone], although the hold times can be quite long.   Thank you for choosing [facility]    Bempedoic acid tablets (Nexletol) What is this medicine? Bempedoic acid (BEM pe DOE ik AS id) is used to lower the level of cholesterol in the blood. It is used with other cholesterol-lowering drugs. This medicine may be used for other purposes; ask your health care provider or pharmacist if you have questions. COMMON BRAND NAME(S): NEXLETOL What should I tell my health care provider before I take this medicine? They need to know if you have any of these conditions:  gout  kidney problems  liver problems  tendon problems  an unusual or allergic reaction to  bempedoic acid, other medicines, foods, dyes, or preservatives  pregnant or trying to become pregnant  breast-feeding How should I use this medicine? Take this medicine by mouth with a glass of water. Follow the directions on the prescription label. You can take it with or without food. If it upsets your stomach, take it with food. Take your doses at regular intervals. Do not take your medicine more often than directed. Talk to your pediatrician about the use of this medicine in children. Special care may be needed. Overdosage: If you think you have taken too much of this medicine contact a poison control center or emergency room at once. NOTE: This medicine is only for you. Do not share this medicine with others. What if I miss a dose? If you miss a dose, take it as soon as you can. If it is almost time for your next dose, take only that dose. Do not take double or extra doses. What may interact with this medicine? This medicine may interact with the following medications:  simvastatin  pravastatin This list may not describe all possible interactions. Give your health care provider a list of all the medicines, herbs, non-prescription drugs, or dietary supplements you use. Also tell them if you smoke, drink alcohol, or use illegal drugs. Some items may interact with your medicine. What should I watch for while using this medicine? Visit your health care professional for regular checks on your progress. Tell your health care professional  if your symptoms do not start to get better or if they get worse. You may need blood work done while you are taking this medicine. This drug is only part of a total heart-health program. Your doctor or a dietician can suggest a low-cholesterol and low-fat diet to help. Avoid alcohol and smoking, and keep a proper exercise schedule. What side effects may I notice from receiving this medicine? Side effects that you should report to your doctor or health care  professional as soon as possible:  allergic reactions like skin rash, itching or hives, swelling of the face, lips, or tongue  signs of gout such as swollen, red, warm, or tender joints, especially in the toes  signs of tendon problems such as tendon pain or swelling or if you are unable to move a joint Side effects that usually do not require medical attention (report these to your doctor or health care professional if they continue or are bothersome):  back pain  cold or flu-like symptoms  muscle spasms  stomach pain This list may not describe all possible side effects. Call your doctor for medical advice about side effects. You may report side effects to FDA at 1-800-FDA-1088. Where should I keep my medicine? Keep out of the reach of children. Store at room temperature between 20 and 25 degrees C (68 and 77 degrees F). Keep this medicine in the original container. Do not throw out the packet in the container. It keeps the medicine dry. Throw away any unused medication after the expiration date. NOTE: This sheet is a summary. It may not cover all possible information. If you have questions about this medicine, talk to your doctor, pharmacist, or health care provider.  2020 Elsevier/Gold Standard (2018-07-10 16:19:16)   Evolocumab injection (Repatha - $90) What is this medicine? EVOLOCUMAB (e voe LOK ue mab) is known as a PCSK9 inhibitor. It is used to lower the level of cholesterol in the blood. It may be used alone or in combination with other cholesterol-lowering drugs. This drug may also be used to reduce the risk of heart attack, stroke, and certain types of heart surgery in patients with heart disease. This medicine may be used for other purposes; ask your health care provider or pharmacist if you have questions. COMMON BRAND NAME(S): Repatha What should I tell my health care provider before I take this medicine? They need to know if you have any of these conditions:  an  unusual or allergic reaction to evolocumab, other medicines, latex, foods, dyes, or preservatives  pregnant or trying to get pregnant  breast-feeding How should I use this medicine? This medicine is for injection under the skin. You will be taught how to prepare and give this medicine. Use exactly as directed. Take your medicine at regular intervals. Do not take your medicine more often than directed. It is important that you put your used needles and syringes in a special sharps container. Do not put them in a trash can. If you do not have a sharps container, call your pharmacist or health care provider to get one. Talk to your pediatrician regarding the use of this medicine in children. While this drug may be prescribed for children as young as 13 years for selected conditions, precautions do apply. Overdosage: If you think you have taken too much of this medicine contact a poison control center or emergency room at once. NOTE: This medicine is only for you. Do not share this medicine with others. What if I miss a  dose? If you miss a dose, take it as soon as you can if there are more than 7 days until the next scheduled dose, or skip the missed dose and take the next dose according to your original schedule. Do not take double or extra doses. What may interact with this medicine? Interactions are not expected. This list may not describe all possible interactions. Give your health care provider a list of all the medicines, herbs, non-prescription drugs, or dietary supplements you use. Also tell them if you smoke, drink alcohol, or use illegal drugs. Some items may interact with your medicine. What should I watch for while using this medicine? Visit your health care provider for regular checks on your progress. Tell your health care provider if your symptoms do not start to get better or if they get worse. You may need blood work done while you are taking this drug. Do not wear the on-body infuser  during an MRI. What side effects may I notice from receiving this medicine? Side effects that you should report to your doctor or health care professional as soon as possible:  allergic reactions like skin rash, itching or hives, swelling of the face, lips, or tongue  signs and symptoms of high blood sugar such as dizziness; dry mouth; dry skin; fruity breath; nausea; stomach pain; increased hunger or thirst; increased urination  signs and symptoms of infection like fever or chills; cough; sore throat; pain or trouble passing urine Side effects that usually do not require medical attention (report to your doctor or health care professional if they continue or are bothersome):  diarrhea  nausea  muscle pain  pain, redness, or irritation at site where injected This list may not describe all possible side effects. Call your doctor for medical advice about side effects. You may report side effects to FDA at 1-800-FDA-1088. Where should I keep my medicine? Keep out of the reach of children. You will be instructed on how to store this medicine. Throw away any unused medicine after the expiration date on the label. NOTE: This sheet is a summary. It may not cover all possible information. If you have questions about this medicine, talk to your doctor, pharmacist, or health care provider.  2020 Elsevier/Gold Standard (2019-03-04 16:22:29)

## 2019-11-11 NOTE — Assessment & Plan Note (Signed)
Patient with ASCVD and most recent LDL not at goal.  She has tolerated most statin drugs for up to several months before developing myalgias.  Continues with ezetimibe without issue, however this is not enough to keep LDL levels at goal.  Today we reviewed options for lowering LDL cholesterol, including PCSK-9 inhibitors, bempedoic acid and inclisiran (ORION trial).  Discussed mechanisms of action, dosing, side effects, costs and potential decreases in LDL cholesterol.  Answered all patient questions.  Patient would like to discuss her options with her husband before making a decision.  She was given direct number to CVRR clinic, once she decides we can start PA process.

## 2019-11-11 NOTE — Progress Notes (Signed)
11/11/2019 [place] 1940-10-24 [id number]   HPI:  [name] is a 79 y.o. female patient of Dr Martinique, who presents today for a lipid clinic evaluation.  See pertinent past medical history below.  She saw him last in February and was doing well.  Dyspnea on exertion had eased, and she found that she could walk up hills without breathing issues.  Unfortunately she developed myalgias around the same time - thru her thighs, calves and feet.  She discontinued pravastatin on her own (as she had previous myalgias with other statins).  At that visit she had been off pravastatin for about a week, and was already feeling better.    Today she is here in the office to discuss cholesterol lowering options.    Past Medical History: ASCVD S/p CABG x 3 2013; bilateral carotid artery stenosis  hypertension Controlled on   Atrial fibrillation CHADS2-VASc score 5 - post op w/o documented recurrence  Ulcerative colitis On mesalamine and probiotic   Current Medications: ezetimibe 10 mg qd  Cholesterol Goals: LDL < 70   Intolerant/previously tried: pravastatin 80 mg, rosuvastatin 10 mg, atorvastatin, simvastatin - all caused myalgias in legs/calves  Family history:  Mother with history of CABG, father had MI at 35  Labs: 5/21:  TC 181, TG 113, HDL 55, LDL 106   Current Outpatient Medications  Medication Sig Dispense Refill  . ascorbic acid (VITAMIN C) 1000 MG tablet Take by mouth.    Marland Kitchen aspirin 81 MG tablet Take 81 mg by mouth daily.    . Bacillus Coagulans-Inulin (PROBIOTIC-PREBIOTIC) 1-250 BILLION-MG CAPS Take 1 capsule by mouth daily.     . Calcium Carb-Cholecalciferol (SM CALCIUM/VITAMIN D) 600-800 MG-UNIT TABS Take 1 tablet by mouth daily.    . Coenzyme Q10 200 MG TABS Take 1 tablet by mouth daily.   0  . ezetimibe (ZETIA) 10 MG tablet Take 1 tablet (10 mg total) by mouth daily. 90 tablet 3  . furosemide (LASIX) 20 MG tablet Take 1 tablet (20 mg total) by mouth daily. 90 tablet 3  .  losartan (COZAAR) 50 MG tablet Take 50 mg by mouth daily.    . mesalamine (APRISO) 0.375 g 24 hr capsule Take 2 capsules (0.75 g total) by mouth 2 (two) times daily. (Patient taking differently: Take 0.375 g by mouth 2 (two) times daily. ) 120 capsule 11  . potassium chloride SA (KLOR-CON) 20 MEQ tablet Take 1 tablet (20 mEq total) by mouth daily. 90 tablet 3  . losartan (COZAAR) 50 MG tablet Take 1 tablet (50 mg total) by mouth daily. 90 tablet 3  . Multiple Vitamin (MULTI-VITAMIN) tablet Take by mouth.     No current facility-administered medications for this visit.    Allergies  Allergen Reactions  . Apple Anaphylaxis  . Nadolol Hives and Anaphylaxis    Rash and nausea  . Other Other (See Comments) and Anaphylaxis    apples Apples-anaphylaxis  . Sulfa Antibiotics Hives  . Sulfamethoxazole Anaphylaxis  . Sulfonamide Derivatives Anaphylaxis  . Verapamil Other (See Comments) and Anaphylaxis    seizures  . Crestor [Rosuvastatin] Other (See Comments)    Myalgias   . Flagyl [Metronidazole] Hives    Past Medical History:  Diagnosis Date  . Anemia    "in my early teens"  . Arthritis    neck and back  . Atrial fibrillation ([place])    post-op after CABG;  treated with Amiodarone and coumadin until readmxn with CDiff colitis, perf colon and  elevated LFTs (amio and coumadin stopped)  . C. difficile colitis 0/9295   complicated post CABG course with prolonged admission, perforated sigmoid colon;  s/p sigmoid colectomy with end colostomy [name] procedure);  laparotomy wound infection with Pseudomonas treated with antibxs and secondary intention  . Chronic kidney disease    hx kidney stones  . Coronary artery disease    a. NSTEMI 7/12, cath: pLAD 30%, mLAD 60-70%, dLAD 80%, oOM2 50%, mOM2 40%, trifurcation 80%, pRCA 25%, then occluded, L-R collats, EF 55%;   b. 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 Roxy Manns) // c. ETT 3/18: inf-lat ST depression of 1 mm at 4 min of exercise,  frequent ectopy and hypertensive BP response // d. Myoview 4/18: EF 68, no ischemia; low risk   . 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 GERD (gastroesophageal reflux disease)   . H/O hiatal hernia    "slight; not having problems with it"  . Hair loss    after heart surgery  . Hemorrhoids    "none since hemorrhoid OR"  . History of bronchitis    late 1980's  . Hyperlipidemia    takes Pravastatin daily  . Hypertension    takes Metoprolol daily  . Mitral valve prolapse    was told this in the late 1990's; ;then with Dr.Mclean he states that she doesn't  . Myocardial infarction ([place]) 11/2010  . Palpitations 2003   hx of; Holter in 2003 with PACs and PVCs  . Pericarditis 1980   hx of  . 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 Seizures ([place])    "w/verapimil"  . Surgical wound infection    Pseudomonas; wound VAC; secondary intention  . 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   Hyperlipidemia LDL goal <70 Patient with ASCVD and most recent LDL not at goal.  She has tolerated most statin drugs for up to several months before developing myalgias.  Continues with ezetimibe without issue, however this is not enough to keep LDL levels at goal.  Today we reviewed options for lowering LDL cholesterol, including PCSK-9 inhibitors, bempedoic acid and inclisiran (ORION trial).  Discussed mechanisms of action, dosing, side effects, costs and potential decreases in LDL cholesterol.  Answered all patient questions.  Patient would like to discuss her options with her husband before making a decision.  She was given direct number to CVRR clinic, once she decides we can start PA process.     Tommy Medal PharmD CPP [facility] [address] [place], [place] [address] [phone]

## 2019-11-14 ENCOUNTER — Telehealth: Payer: Self-pay | Admitting: Pharmacist Clinician (PhC)/ Clinical Pharmacy Specialist

## 2019-11-14 MED ORDER — REPATHA SURECLICK 140 MG/ML ~~LOC~~ SOAJ: 140.0000 mg | SUBCUTANEOUS | 0 refills | Status: DC

## 2019-11-14 NOTE — Telephone Encounter (Signed)
Patient was in office earlier this week.  Decided that she would prefer to try Repatha for lowering cholesterol, but wanted to try sample first.  Will set sample pen aside for her and start paperwork to get PA thru [place].

## 2019-11-24 ENCOUNTER — Telehealth: Payer: Self-pay | Admitting: Cardiology

## 2019-11-24 DIAGNOSIS — H9312 Tinnitus, left ear: Secondary | ICD-10-CM | POA: Insufficient documentation

## 2019-11-24 DIAGNOSIS — H903 Sensorineural hearing loss, bilateral: Secondary | ICD-10-CM | POA: Diagnosis not present

## 2019-11-24 DIAGNOSIS — H9313 Tinnitus, bilateral: Secondary | ICD-10-CM | POA: Diagnosis not present

## 2019-11-24 NOTE — Telephone Encounter (Signed)
Patient is requesting to speak with Dr. Doug Sou nurse. She states she has questions regarding Evolocumab (REPATHA SURECLICK) 984 MG/ML SOAJ medication. She did not go into further detail. However, she states she would like a call back from the nurse. Please call.

## 2019-11-24 NOTE — Telephone Encounter (Signed)
Spoke with pt, aware of dr Doug Sou recommendations.

## 2019-11-24 NOTE — Telephone Encounter (Signed)
Spoke with pt, she had talked with kristin and was given the repatha, once she got home she read the information on the drug and is very concerned about the serious side effects it talks about. She has not been able to tolerate a lot of medications and is wanting to know if dr Martinique feels it will be safe for her to take. Will forward to dr Martinique to review and advise.

## 2019-11-24 NOTE — Telephone Encounter (Signed)
I think it is safe. The incidence of side effects is very low and the medication has shown reduction in risk of heart attack and stroke. She should try it. If she has symptoms she is concerned about let us know.  Geoge Lawrance Martinique MD, [facility]

## 2019-12-05 ENCOUNTER — Other Ambulatory Visit: Payer: Self-pay | Admitting: Pharmacist

## 2019-12-05 MED ORDER — REPATHA SURECLICK 140 MG/ML ~~LOC~~ SOAJ: 140.0000 mg | SUBCUTANEOUS | 11 refills | Status: DC

## 2019-12-08 ENCOUNTER — Telehealth: Payer: Self-pay

## 2019-12-08 MED ORDER — REPATHA SURECLICK 140 MG/ML ~~LOC~~ SOAJ: 140.0000 mg | SUBCUTANEOUS | 11 refills | Status: DC

## 2019-12-08 NOTE — Telephone Encounter (Signed)
Pt called in stated that she hadn't received her med for repatha cholesterol visit but was approved for the [employer] foundation. I stated that I would complete a pa and get back to her asap to let her know if she was approved or not. I apologized for this slipping throught the cracks so to speak

## 2019-12-08 NOTE — Telephone Encounter (Signed)
Called and spoke w/pt stated that they were approved for the repatha and for them to call if they have issues, pt voiced understanding

## 2019-12-23 DIAGNOSIS — E785 Hyperlipidemia, unspecified: Secondary | ICD-10-CM | POA: Diagnosis not present

## 2019-12-23 DIAGNOSIS — Z79899 Other long term (current) drug therapy: Secondary | ICD-10-CM | POA: Diagnosis not present

## 2019-12-23 LAB — LIPID PANEL
Chol/HDL Ratio: 2 ratio (ref 0.0–4.4)
Cholesterol, Total: 115 mg/dL (ref 100–199)
HDL: 57 mg/dL (ref >39)
LDL Chol Calc (NIH): 40 mg/dL (ref 0–99)
Triglycerides: 94 mg/dL (ref 0–149)
VLDL Cholesterol Cal: 18 mg/dL (ref 5–40)

## 2019-12-24 DIAGNOSIS — I25110 Atherosclerotic heart disease of native coronary artery with unstable angina pectoris: Secondary | ICD-10-CM | POA: Diagnosis not present

## 2019-12-24 DIAGNOSIS — I200 Unstable angina: Secondary | ICD-10-CM | POA: Insufficient documentation

## 2019-12-24 DIAGNOSIS — Z79899 Other long term (current) drug therapy: Secondary | ICD-10-CM | POA: Diagnosis not present

## 2019-12-24 DIAGNOSIS — I252 Old myocardial infarction: Secondary | ICD-10-CM | POA: Diagnosis not present

## 2019-12-24 DIAGNOSIS — R079 Chest pain, unspecified: Secondary | ICD-10-CM | POA: Diagnosis not present

## 2019-12-24 DIAGNOSIS — Z03818 Encounter for observation for suspected exposure to other biological agents ruled out: Secondary | ICD-10-CM | POA: Diagnosis not present

## 2019-12-24 DIAGNOSIS — Z20822 Contact with and (suspected) exposure to covid-19: Secondary | ICD-10-CM | POA: Diagnosis not present

## 2019-12-24 DIAGNOSIS — Z951 Presence of aortocoronary bypass graft: Secondary | ICD-10-CM | POA: Diagnosis not present

## 2019-12-24 DIAGNOSIS — I10 Essential (primary) hypertension: Secondary | ICD-10-CM | POA: Diagnosis not present

## 2019-12-24 DIAGNOSIS — Z7982 Long term (current) use of aspirin: Secondary | ICD-10-CM | POA: Diagnosis not present

## 2019-12-24 DIAGNOSIS — E785 Hyperlipidemia, unspecified: Secondary | ICD-10-CM | POA: Diagnosis not present

## 2019-12-24 DIAGNOSIS — I2510 Atherosclerotic heart disease of native coronary artery without angina pectoris: Secondary | ICD-10-CM | POA: Diagnosis not present

## 2019-12-25 ENCOUNTER — Telehealth: Payer: Self-pay | Admitting: Cardiology

## 2019-12-25 DIAGNOSIS — R279 Unspecified lack of coordination: Secondary | ICD-10-CM | POA: Diagnosis not present

## 2019-12-25 DIAGNOSIS — I252 Old myocardial infarction: Secondary | ICD-10-CM | POA: Diagnosis not present

## 2019-12-25 DIAGNOSIS — E785 Hyperlipidemia, unspecified: Secondary | ICD-10-CM | POA: Diagnosis not present

## 2019-12-25 DIAGNOSIS — I200 Unstable angina: Secondary | ICD-10-CM | POA: Diagnosis not present

## 2019-12-25 DIAGNOSIS — I25110 Atherosclerotic heart disease of native coronary artery with unstable angina pectoris: Secondary | ICD-10-CM | POA: Diagnosis not present

## 2019-12-25 DIAGNOSIS — Z743 Need for continuous supervision: Secondary | ICD-10-CM | POA: Diagnosis not present

## 2019-12-25 DIAGNOSIS — R0789 Other chest pain: Secondary | ICD-10-CM | POA: Diagnosis not present

## 2019-12-25 DIAGNOSIS — I2510 Atherosclerotic heart disease of native coronary artery without angina pectoris: Secondary | ICD-10-CM | POA: Diagnosis not present

## 2019-12-25 DIAGNOSIS — I201 Angina pectoris with documented spasm: Secondary | ICD-10-CM | POA: Diagnosis not present

## 2019-12-25 DIAGNOSIS — I10 Essential (primary) hypertension: Secondary | ICD-10-CM | POA: Diagnosis not present

## 2019-12-25 DIAGNOSIS — J383 Other diseases of vocal cords: Secondary | ICD-10-CM | POA: Diagnosis not present

## 2019-12-25 DIAGNOSIS — Z951 Presence of aortocoronary bypass graft: Secondary | ICD-10-CM | POA: Diagnosis not present

## 2019-12-25 NOTE — Telephone Encounter (Signed)
Patient's husband states the patient had an emergency and is now at the ER in [place], [place]. He states he would like to further discuss with Dr. Doug Sou nurse Malachy Mood to determine whether or not the patient needs to reschedule appointment currently scheduled for 12/29/19 with Dr. Martinique. Please call.

## 2019-12-25 NOTE — Telephone Encounter (Signed)
Please excuse my error. The patient is at the ER in [place], [place].

## 2019-12-25 NOTE — Telephone Encounter (Signed)
Returned call to husband-patient went to ER yesterday for chest pain was trying to be transferred to [facility] but [facility] was full and couldn't accept transfer so she was transferred to [facility] and is currently undergoing a heart cath at current.    Advised to keep appt, we can cancel based on results/outcome of cath.   Husband will call this afternoon or tomorrow to update and decide.

## 2019-12-26 NOTE — Progress Notes (Addendum)
Date:  12/29/2019   ID:  Adaira, Centola [birth year], MRN [id number]   PCP:  Angelina Sheriff, MD  Cardiologist:  Vianey Caniglia Martinique, MD  Electrophysiologist:  None   Evaluation Performed:  Follow-Up Visit  Chief Complaint:  CAD  History of Present Illness:    [name] is a 79 y.o. 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 Roxy Manns (LIMA-LAD, SVG-OM, SVG-PDA), postop atrial fibrillation, spastic dysphonia, carotid artery disease, hypertension and hyperlipidemia.  Cardiopulmonary stress test in 2017 showed no cardiac or ventilatory limitation.  ETT March 2018 showed hypertensive response.  Previous Myoview in April 2018 was low risk but no ischemia or scar.  She is intolerant of Crestor.  Patient was seen on 07/08/2018 at which time she complained of increasing dyspnea on exertion going up hills.  This is similar to her previous symptoms prior to CABG.  Echocardiogram obtained on 07/15/2018 revealed EF 60 to 65%, mild AI, moderately elevated RV pressure. Repeat Myoview was performed on 07/19/2018 which showed EF 67%, normal perfusion.  Her dyspnea on exertion was felt to be related to diastolic heart failure and she was placed on low-dose Lasix and potassium supplement.  Losartan was increased to 100 mg daily.  She was seen in our lipid clinic in June. Options for further lipid management were discussed. She was started on Repatha.   She presented to [facility] ED on August 11 with unstable angina. 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 Subsequent troponins were normal. Transferred to [place] in [place] cardiac cath showing patent bypass grafts. This included a LIMA to the LAD, SVG to PDA and apparently SVG to diagonal (instead of OM as noted in op report).  She underwent PCI of the proximal LCx with a 2.75 x 12 mm Synergy stent. Echo was unremarkable except for moderate TR. She was started on Plavix.   On  follow up today she has no recurrent chest pain. Does note some intermittent palpitations. No dyspnea.    Past Medical History:  Diagnosis Date  . Anemia    "in my early teens"  . Arthritis    neck and back  . Atrial fibrillation ([place])    post-op after CABG;  treated with Amiodarone and coumadin until readmxn with CDiff colitis, perf colon and elevated LFTs (amio and coumadin stopped)  . C. difficile colitis 05/2195   complicated post CABG course with prolonged admission, perforated sigmoid colon;  s/p sigmoid colectomy with end colostomy [name] procedure);  laparotomy wound infection with Pseudomonas treated with antibxs and secondary intention  . Chronic kidney disease    hx kidney stones  . Coronary artery disease    a. NSTEMI 7/12, cath: pLAD 30%, mLAD 60-70%, dLAD 80%, oOM2 50%, mOM2 40%, trifurcation 80%, pRCA 25%, then occluded, L-R collats, EF 55%;   b. 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 Roxy Manns) // c. ETT 3/18: inf-lat ST depression of 1 mm at 4 min of exercise, frequent ectopy and hypertensive BP response // d. Myoview 4/18: EF 68, no ischemia; low risk   . 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 GERD (gastroesophageal reflux disease)   . H/O hiatal hernia    "slight; not having problems with it"  . Hair loss    after heart surgery  . Hemorrhoids    "none since hemorrhoid OR"  . History of bronchitis  late 1980's  . Hyperlipidemia    takes Pravastatin daily  . Hypertension    takes Metoprolol daily  . Mitral valve prolapse    was told this in the late 1990's; ;then with Dr.Mclean he states that she doesn't  . Myocardial infarction ([place]) 11/2010  . Palpitations 2003   hx of; Holter in 2003 with PACs and PVCs  . Pericarditis 1980   hx of  . 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 S/P colostomy ([place])    due to perf sigmoid colon in setting of CDiff colitis  . Seizures ([place])    "w/verapimil"  . Surgical wound infection    Pseudomonas;  wound VAC; secondary intention  . 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 ABDOMINAL HYSTERECTOMY     partial hysterectomy  . APPENDECTOMY     as a child  . BOTOX INJECTION  12/03/2015   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  . CARDIAC CATHETERIZATION  11/21/10  . CATARACT EXTRACTION, BILATERAL    . CHOLECYSTECTOMY N/A 01/25/2016   Procedure: LAPAROSCOPIC CHOLECYSTECTOMY WITH INTRAOPERATIVE CHOLANGIOGRAM;  Surgeon: [name], MD;  Location: [facility];  Service: General;  Laterality: N/A;  . COLONOSCOPY    . COLOSTOMY RECONNECT    . colostomy reversal  06/27/11  . CORONARY ARTERY BYPASS GRAFT  11-25-2010   CABG X3; LIMA to LAD, SVG to PDA, SVG to OM, EVH via right thigh  . ESOPHAGOGASTRODUODENOSCOPY    . FLEXIBLE SIGMOIDOSCOPY  11/09/2011   Procedure: FLEXIBLE SIGMOIDOSCOPY;  Surgeon: Milus Banister, MD;  [facility];  Service: Endoscopy;  Laterality: N/A;  . HEMORRHOID SURGERY  ~ 2005   x 2  . OOPHORECTOMY  2005   left; w/"mass removal"  . PROCTOSCOPY  06/27/2011   Procedure: PROCTOSCOPY;  Surgeon: Gayland Curry, MD;  Location: [facility];  Service: General;  Laterality: N/A;  . SIGMOIDECTOMY  12/18/2010   with end colostomy  . TUBAL LIGATION       Current Meds  Medication Sig  . ascorbic acid (VITAMIN C) 1000 MG tablet Take by mouth.  Marland Kitchen aspirin 81 MG tablet Take 81 mg by mouth daily.  . Bacillus Coagulans-Inulin (PROBIOTIC-PREBIOTIC) 1-250 BILLION-MG CAPS Take 1 capsule by mouth daily.   . Calcium Carb-Cholecalciferol (SM CALCIUM/VITAMIN D) 600-800 MG-UNIT TABS Take 1 tablet by mouth daily.  . clopidogrel (PLAVIX) 75 MG tablet Take by mouth.  . Coenzyme Q10 200 MG TABS Take 1 tablet by mouth daily.   . Evolocumab (REPATHA SURECLICK) 595 MG/ML SOAJ Inject 140 mg into the skin every 14 (fourteen) days.  Marland Kitchen ezetimibe (ZETIA) 10 MG tablet Take 1 tablet (10 mg total) by mouth daily.  . furosemide (LASIX) 20 MG tablet  Take 1 tablet (20 mg total) by mouth daily.  Marland Kitchen losartan (COZAAR) 50 MG tablet Take 50 mg by mouth daily.  . mesalamine (APRISO) 0.375 g 24 hr capsule Take 2 capsules (0.75 g total) by mouth 2 (two) times daily. (Patient taking differently: Take 0.375 g by mouth 2 (two) times daily. )  . Multiple Vitamin (MULTI-VITAMIN) tablet Take by mouth.  . potassium chloride SA (KLOR-CON) 20 MEQ tablet Take 1 tablet (20 mEq total) by mouth daily.     Allergies:   Apple, Nadolol, Other, Sulfa antibiotics, Sulfamethoxazole, Sulfonamide derivatives, Verapamil, Crestor [rosuvastatin], and Flagyl [metronidazole]   Social History   Tobacco Use  . Smoking status: Never Smoker  .  Smokeless tobacco: Never Used  Vaping Use  . Vaping Use: Never used  Substance Use Topics  . Alcohol use: No  . Drug use: No     Family Hx: The patient's family history includes Coronary artery disease in her mother; Heart attack (age of onset: 83) in her father; Heart disease in her father and mother. There is no history of Anesthesia problems, Hypotension, Malignant hyperthermia, Pseudochol deficiency, Colon cancer, Esophageal cancer, Stomach cancer, or Rectal cancer.  ROS:   Please see the history of present illness.    All other systems reviewed and are negative.   Prior CV studies:   The following studies were reviewed today:  Echo 07/17/18: IMPRESSIONS    1. The left ventricle has normal systolic function with an ejection fraction of 60-65%. The cavity size was normal. Left ventricular diastolic Doppler parameters are consistent with pseudonormalization Elevated left atrial and left ventricular  end-diastolic pressures.  2. The right ventricle has normal systolic function. The cavity was normal. There is no increase in right ventricular wall thickness. Right ventricular systolic pressure is moderately elevated with an estimated pressure of 43.7 mmHg.  3. The mitral valve is degenerative. Moderate thickening of the  mitral valve leaflets with moderate involvement of the chordae tendinae. Mild calcification of the mitral valve leaflet.  4. The tricuspid valve is normal in structure.  5. The aortic valve is tricuspid Aortic valve regurgitation is mild by color flow Doppler.  6. The pulmonic valve was normal in structure.  7. The inferior vena cava was dilated in size with <50% respiratory variability.  8. The interatrial septum appears to be lipomatous.   Myoview 07/19/2018 Study Highlights     The left ventricular ejection fraction is hyperdynamic (>65%).  Nuclear stress EF: 67%.  No T wave inversion was noted during stress.  There was no ST segment deviation noted during stress.  The study is normal.  This is a low risk study.  Normal perfusion. LVEF 67% with normal wall motion. This is a low risk study. No change compared to prior study in 2018.     Labs/Other Tests and Data Reviewed:    EKG:  Ecg today shows NSR rate 61. Normal. I have personally reviewed and interpreted this study.   Recent Labs: 01/24/2019: BUN 20; Creatinine, Ser 0.86; Hemoglobin 13.1; Platelets 127; Potassium 4.7; Sodium 137 06/11/2019: ALT 14   Recent Lipid Panel Lab Results  Component Value Date/Time   CHOL 115 12/23/2019 08:50 AM   TRIG 94 12/23/2019 08:50 AM   HDL 57 12/23/2019 08:50 AM   CHOLHDL 2.0 12/23/2019 08:50 AM   CHOLHDL 3 04/13/2014 09:34 AM   LDLCALC 40 12/23/2019 08:50 AM   LDLDIRECT 81.8 07/20/2011 03:04 PM   Dated 07/02/18: cholesterol 161, triglycerides 81, HDL 52, LDL 93. CMET and TSH normal  Dated 12/24/19: BNP 300. Glucose 144, BUN 22, otherwise CMET normal. plts 132K. WBC 3.2K.   Wt Readings from Last 3 Encounters:  12/29/19 137 lb 9.6 oz (62.4 kg)  08/25/19 141 lb (64 kg)  07/29/19 140 lb (63.5 kg)     Objective:    Vital Signs:  BP (!) 141/61   Pulse 61   Ht 5' 6"  (1.676 m)   Wt 137 lb 9.6 oz (62.4 kg)   SpO2 100%   BMI 22.21 kg/m    GENERAL:  Well appearing EF in  NAD HEENT:  PERRL, EOMI, sclera are clear. Oropharynx is clear. NECK:  No jugular venous distention, carotid upstroke  brisk and symmetric, no bruits, no thyromegaly or adenopathy LUNGS:  Clear to auscultation bilaterally CHEST:  Unremarkable HEART:  RRR,  PMI not displaced or sustained,S1 and S2 within normal limits, no S3, no S4: no clicks, no rubs, no murmurs ABD:  Soft, nontender. BS +, no masses or bruits. No hepatomegaly, no splenomegaly EXT:  2 + pulses throughout, no edema, no cyanosis no clubbing. She has some bruising at her right groin site but no hematoma. SKIN:  Warm and dry.  No rashes NEURO:  Alert and oriented x 3. Cranial nerves II through XII intact. PSYCH:  Cognitively intact    ASSESSMENT & PLAN:    1.  CAD s/p CABG x 3 in 2012 for multivessel CAD. Normal Myoview in May 2020. Now with recent episode of unstable angina. S/p DES of the proximal LCx. Patent grafts.  Now on ASA and Plavix for one year. I have requested a copy of her cath films from [facility] to review. Follow up in 3 months.   2. Hypercholesterolemia. intolerant of statins. On Zetia. Now on Repatha with excellent response LDL 40.   3. Carotid arterial disease. 60-79% RICA stenosis. No change in Carotid dopplers 11/25/18.   4. HTN controlled.     Medication Adjustments/Labs and Tests Ordered: Current medicines are reviewed at length with the patient today.  Concerns regarding medicines are outlined above.   Tests Ordered: No orders of the defined types were placed in this encounter.   Medication Changes: No orders of the defined types were placed in this encounter.   Follow Up:  3 months   Signed, Jawaun Celmer Martinique, MD  12/29/2019 11:28 AM    [facility]  Addendum I reviewed films from her cardiac cath in [facility]. She has patent LIMA to the LAD, SVG to OM1, and SVG to PDA. There was a severe stenosis in the proximal LCx that supplies the second and third OMs  distally. This stenosis was successfully stented. The mid to distal LCx has moderate diffuse disease.  Tejas Seawood Martinique MD, [facility]

## 2019-12-29 ENCOUNTER — Other Ambulatory Visit: Payer: Self-pay

## 2019-12-29 ENCOUNTER — Ambulatory Visit: Payer: PPO | Admitting: Cardiology

## 2019-12-29 ENCOUNTER — Encounter: Payer: Self-pay | Admitting: Cardiology

## 2019-12-29 VITALS — BP 141/61 | HR 61 | Ht 66.0 in | Wt 137.6 lb

## 2019-12-29 DIAGNOSIS — T466X5D Adverse effect of antihyperlipidemic and antiarteriosclerotic drugs, subsequent encounter: Secondary | ICD-10-CM | POA: Diagnosis not present

## 2019-12-29 DIAGNOSIS — I25700 Atherosclerosis of coronary artery bypass graft(s), unspecified, with unstable angina pectoris: Secondary | ICD-10-CM | POA: Diagnosis not present

## 2019-12-29 DIAGNOSIS — I6523 Occlusion and stenosis of bilateral carotid arteries: Secondary | ICD-10-CM

## 2019-12-29 DIAGNOSIS — I10 Essential (primary) hypertension: Secondary | ICD-10-CM | POA: Diagnosis not present

## 2019-12-29 DIAGNOSIS — G720 Drug-induced myopathy: Secondary | ICD-10-CM

## 2019-12-29 DIAGNOSIS — T466X5A Adverse effect of antihyperlipidemic and antiarteriosclerotic drugs, initial encounter: Secondary | ICD-10-CM

## 2019-12-31 DIAGNOSIS — N3091 Cystitis, unspecified with hematuria: Secondary | ICD-10-CM | POA: Diagnosis not present

## 2019-12-31 DIAGNOSIS — Z6822 Body mass index (BMI) 22.0-22.9, adult: Secondary | ICD-10-CM | POA: Diagnosis not present

## 2020-01-01 DIAGNOSIS — N10 Acute tubulo-interstitial nephritis: Secondary | ICD-10-CM | POA: Diagnosis not present

## 2020-01-01 DIAGNOSIS — I259 Chronic ischemic heart disease, unspecified: Secondary | ICD-10-CM | POA: Diagnosis not present

## 2020-01-08 DIAGNOSIS — Z6822 Body mass index (BMI) 22.0-22.9, adult: Secondary | ICD-10-CM | POA: Diagnosis not present

## 2020-01-08 DIAGNOSIS — B029 Zoster without complications: Secondary | ICD-10-CM | POA: Diagnosis not present

## 2020-01-08 DIAGNOSIS — I10 Essential (primary) hypertension: Secondary | ICD-10-CM | POA: Diagnosis not present

## 2020-01-08 DIAGNOSIS — Z0000 Encounter for general adult medical examination without abnormal findings: Secondary | ICD-10-CM | POA: Diagnosis not present

## 2020-01-20 ENCOUNTER — Other Ambulatory Visit: Payer: Self-pay

## 2020-01-20 ENCOUNTER — Other Ambulatory Visit: Payer: Self-pay | Admitting: Cardiology

## 2020-01-20 NOTE — Telephone Encounter (Signed)
*  STAT* If patient is at the pharmacy, call can be transferred to refill team.   1. Which medications need to be refilled? (please list name of each medication and dose if known) clopidogrel (PLAVIX) 75 MG tablet  2. Which pharmacy/location (including street and city if local pharmacy) is medication to be sent to? [place], [place]  3. Do they need a 30 day or 90 day supply? 90 day supply

## 2020-01-21 MED ORDER — CLOPIDOGREL BISULFATE 75 MG PO TABS: 75.0000 mg | ORAL_TABLET | Freq: Once | ORAL | 3 refills | Status: AC

## 2020-02-02 DIAGNOSIS — M1990 Unspecified osteoarthritis, unspecified site: Secondary | ICD-10-CM | POA: Diagnosis not present

## 2020-02-02 DIAGNOSIS — B0229 Other postherpetic nervous system involvement: Secondary | ICD-10-CM | POA: Diagnosis not present

## 2020-02-02 DIAGNOSIS — B372 Candidiasis of skin and nail: Secondary | ICD-10-CM | POA: Diagnosis not present

## 2020-02-02 DIAGNOSIS — Z6822 Body mass index (BMI) 22.0-22.9, adult: Secondary | ICD-10-CM | POA: Diagnosis not present

## 2020-02-24 DIAGNOSIS — M9902 Segmental and somatic dysfunction of thoracic region: Secondary | ICD-10-CM | POA: Diagnosis not present

## 2020-02-24 DIAGNOSIS — M9903 Segmental and somatic dysfunction of lumbar region: Secondary | ICD-10-CM | POA: Diagnosis not present

## 2020-02-24 DIAGNOSIS — B0223 Postherpetic polyneuropathy: Secondary | ICD-10-CM | POA: Diagnosis not present

## 2020-02-24 DIAGNOSIS — M9901 Segmental and somatic dysfunction of cervical region: Secondary | ICD-10-CM | POA: Diagnosis not present

## 2020-02-25 DIAGNOSIS — M9901 Segmental and somatic dysfunction of cervical region: Secondary | ICD-10-CM | POA: Diagnosis not present

## 2020-02-25 DIAGNOSIS — M9903 Segmental and somatic dysfunction of lumbar region: Secondary | ICD-10-CM | POA: Diagnosis not present

## 2020-02-25 DIAGNOSIS — B0223 Postherpetic polyneuropathy: Secondary | ICD-10-CM | POA: Diagnosis not present

## 2020-02-25 DIAGNOSIS — M9902 Segmental and somatic dysfunction of thoracic region: Secondary | ICD-10-CM | POA: Diagnosis not present

## 2020-02-27 DIAGNOSIS — B0223 Postherpetic polyneuropathy: Secondary | ICD-10-CM | POA: Diagnosis not present

## 2020-02-27 DIAGNOSIS — M9901 Segmental and somatic dysfunction of cervical region: Secondary | ICD-10-CM | POA: Diagnosis not present

## 2020-02-27 DIAGNOSIS — M9902 Segmental and somatic dysfunction of thoracic region: Secondary | ICD-10-CM | POA: Diagnosis not present

## 2020-02-27 DIAGNOSIS — M9903 Segmental and somatic dysfunction of lumbar region: Secondary | ICD-10-CM | POA: Diagnosis not present

## 2020-03-03 DIAGNOSIS — Z23 Encounter for immunization: Secondary | ICD-10-CM | POA: Diagnosis not present

## 2020-03-11 ENCOUNTER — Telehealth: Payer: Self-pay | Admitting: Gastroenterology

## 2020-03-11 MED ORDER — MESALAMINE ER 0.375 G PO CP24: 0.3750 g | ORAL_CAPSULE | Freq: Two times a day (BID) | ORAL | 6 refills | Status: DC

## 2020-03-11 NOTE — Telephone Encounter (Signed)
Rx for mesalamine sent to pharmacy as requested.

## 2020-03-22 ENCOUNTER — Other Ambulatory Visit: Payer: Self-pay | Admitting: Cardiology

## 2020-03-26 ENCOUNTER — Telehealth: Payer: Self-pay | Admitting: Cardiology

## 2020-03-26 NOTE — Telephone Encounter (Signed)
Pt called in stated she has an appt 11/22 and would like to know if Dr Martinique would like for her to go to the lab before she comes to her appt.  She would like to know if he wanted to go ahead and put the orders in   Best number for her is [phone]

## 2020-03-26 NOTE — Telephone Encounter (Signed)
Called pt and informed lipid pan was just done 12-2019 too soon for another but will forward message to Dr Merlene Laughter. Suggested to pt to come in fasting for appt just in case Dr Martinique wants to do lab. Pt is asking if she needs to come, informed pt that she needs to come for scheduled appt.

## 2020-03-27 NOTE — Telephone Encounter (Signed)
No new labs needed.  Gleen Ripberger Martinique MD, [facility]

## 2020-03-30 NOTE — Telephone Encounter (Signed)
Spoke to patient Dr.Jordan advised no new labs needed before appointment 04/05/20.

## 2020-04-01 ENCOUNTER — Other Ambulatory Visit: Payer: Self-pay

## 2020-04-01 ENCOUNTER — Ambulatory Visit: Payer: PPO | Admitting: Neurology

## 2020-04-01 ENCOUNTER — Encounter: Payer: Self-pay | Admitting: Neurology

## 2020-04-01 VITALS — BP 161/75 | HR 81 | Ht 66.0 in | Wt 136.8 lb

## 2020-04-01 DIAGNOSIS — B0229 Other postherpetic nervous system involvement: Secondary | ICD-10-CM

## 2020-04-01 MED ORDER — GABAPENTIN 100 MG PO CAPS: ORAL_CAPSULE | ORAL | 0 refills | Status: DC

## 2020-04-01 NOTE — Patient Instructions (Signed)
1.  We will increase the gabapentin 181m:  Take 1 pill three times daily for one week  Then 2 pills three times daily for one week  Then 3 pills three times daily  Contact me for refill and update and we can adjust dose if needed. 2.  Follow up 4 to 6 months.

## 2020-04-01 NOTE — Progress Notes (Signed)
NEUROLOGY CONSULTATION NOTE  [name] MRN: [id number] DOB: 11/03/1940  Referring provider: Angelina Sheriff, MD Primary care provider: Angelina Sheriff, MD  Reason for consult:  Post-herpetic neuralgia   Subjective:  [name] is a 79 year old female who presents for post-herpetic neuralgia.  History supplemented by referring provider's note.  She got shingles in late August.  She developed a rash along the right T5 dermatome from the back to under her breast.  She was prescribed Valtrex and amitriptyline but she appears not to remember taking them.  As rash started to heal into September, she started having worsening pain.  She was started on gabapentin 152m twice daily which was been ineffective.  She had also previously taken hydrocodone-acetaminophen.  She did not use capsaicin cream because it burned.     PAST MEDICAL HISTORY: Past Medical History:  Diagnosis Date  . Anemia    "in my early teens"  . Arthritis    neck and back  . [place]    post-op after CABG;  treated with Amiodarone and coumadin until readmxn with CDiff colitis, perf colon and elevated LFTs (amio and coumadin stopped)  . C. difficile colitis 83/7902  complicated post CABG course with prolonged admission, perforated sigmoid colon;  s/p sigmoid colectomy with end colostomy (Jeanette Capriceprocedure);  laparotomy wound infection with Pseudomonas treated with antibxs and secondary intention  . Chronic kidney disease    hx kidney stones  . Coronary artery disease    a. NSTEMI 7/12, cath: pLAD 30%, mLAD 60-70%, dLAD 80%, oOM2 50%, mOM2 40%, trifurcation 80%, pRCA 25%, then occluded, L-R collats, EF 55%;   b. 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 ORoxy Manns // c. ETT 3/18: inf-lat ST depression of 1 mm at 4 min of exercise, frequent ectopy and hypertensive BP response // d. Myoview 4/18: EF 68, no ischemia; low risk   . 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 GERD (gastroesophageal reflux disease)   . H/O hiatal hernia    "slight; not having problems with it"  . Hair loss    after heart surgery  . Hemorrhoids    "none since hemorrhoid OR"  . History of bronchitis    late 1980's  . Hyperlipidemia    takes Pravastatin daily  . Hypertension    takes Metoprolol daily  . Mitral valve prolapse    was told this in the late 1990's; ;then with Dr.Mclean he states that she doesn't  . Myocardial infarction ([place] 11/2010  . Palpitations 2003   hx of; Holter in 2003 with PACs and PVCs  . Pericarditis 1980   hx of  . 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 S/P colostomy ([place]    due to perf sigmoid colon in setting of CDiff colitis  . Seizures ([place]    "w/verapimil"  . Surgical wound infection    Pseudomonas; wound VAC; secondary intention  . 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 ABDOMINAL HYSTERECTOMY     partial hysterectomy  . APPENDECTOMY     as a child  . BOTOX INJECTION  12/03/2015   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  . CARDIAC CATHETERIZATION  11/21/10  . CATARACT EXTRACTION, BILATERAL    . CHOLECYSTECTOMY N/A 01/25/2016   Procedure: LAPAROSCOPIC CHOLECYSTECTOMY WITH INTRAOPERATIVE CHOLANGIOGRAM;  Surgeon: EGreer Pickerel  MD;  [facility];  Service: General;  Laterality: N/A;  . COLONOSCOPY    . COLOSTOMY RECONNECT    . colostomy reversal  06/27/11  . CORONARY ARTERY BYPASS GRAFT  11-25-2010   CABG X3; LIMA to LAD, SVG to PDA, SVG to OM, EVH via right thigh  . ESOPHAGOGASTRODUODENOSCOPY    . FLEXIBLE SIGMOIDOSCOPY  11/09/2011   Procedure: FLEXIBLE SIGMOIDOSCOPY;  Surgeon: Milus Banister, MD;  [facility];  Service: Endoscopy;  Laterality: N/A;  . HEMORRHOID SURGERY  ~ 2005   x 2  . OOPHORECTOMY  2005   left; w/"mass removal"  . PROCTOSCOPY  06/27/2011   Procedure: PROCTOSCOPY;  Surgeon: Gayland Curry, MD;  Location: [facility];   Service: General;  Laterality: N/A;  . SIGMOIDECTOMY  12/18/2010   with end colostomy  . TUBAL LIGATION      MEDICATIONS: Current Outpatient Medications on File Prior to Visit  Medication Sig Dispense Refill  . ascorbic acid (VITAMIN C) 1000 MG tablet Take by mouth.    Marland Kitchen aspirin 81 MG tablet Take 81 mg by mouth daily.    . Bacillus Coagulans-Inulin (PROBIOTIC-PREBIOTIC) 1-250 BILLION-MG CAPS Take 1 capsule by mouth daily.     . Calcium Carb-Cholecalciferol (SM CALCIUM/VITAMIN D) 600-800 MG-UNIT TABS Take 1 tablet by mouth daily.    . Evolocumab (REPATHA SURECLICK) 532 MG/ML SOAJ Inject 140 mg into the skin every 14 (fourteen) days. 2 pen 11  . ezetimibe (ZETIA) 10 MG tablet Take 1 tablet (10 mg total) by mouth daily. 90 tablet 3  . furosemide (LASIX) 20 MG tablet Take 1 tablet (20 mg total) by mouth daily. 90 tablet 3  . losartan (COZAAR) 50 MG tablet Take 1 tablet by mouth once daily 90 tablet 0  . mesalamine (APRISO) 0.375 g 24 hr capsule Take 1 capsule (0.375 g total) by mouth 2 (two) times daily. 60 capsule 6  . Multiple Vitamin (MULTI-VITAMIN) tablet Take by mouth.    . potassium chloride SA (KLOR-CON) 20 MEQ tablet Take 1 tablet (20 mEq total) by mouth daily. 90 tablet 3  . clopidogrel (PLAVIX) 75 MG tablet Take 75 mg by mouth daily.    . Coenzyme Q10 200 MG TABS Take 1 tablet by mouth daily.   0  . gabapentin (NEURONTIN) 100 MG capsule Take 100 mg by mouth in the morning and at bedtime.     No current facility-administered medications on file prior to visit.    ALLERGIES: Allergies  Allergen Reactions  . Apple Anaphylaxis  . Nadolol Hives and Anaphylaxis    Rash and nausea  . Other Other (See Comments) and Anaphylaxis    apples Apples-anaphylaxis  . Sulfa Antibiotics Hives  . Sulfamethoxazole Anaphylaxis  . Sulfonamide Derivatives Anaphylaxis  . Verapamil Other (See Comments) and Anaphylaxis    seizures  . Crestor [Rosuvastatin] Other (See Comments)    Myalgias   .  Flagyl [Metronidazole] Hives    FAMILY HISTORY: Family History  Problem Relation Age of Onset  . Heart attack Father 39  . Heart disease Father        massive heart attack  . Coronary artery disease Mother        [place]  . Heart disease Mother   . Anesthesia problems Neg Hx   . Hypotension Neg Hx   . Malignant hyperthermia Neg Hx   . Pseudochol deficiency Neg Hx   . Colon cancer Neg Hx   . Esophageal cancer Neg  Hx   . Stomach cancer Neg Hx   . Rectal cancer Neg Hx     SOCIAL HISTORY: Social History   Socioeconomic History  . Marital status: Married    Spouse name: Not on file  . Number of children: 1  . Years of education: Not on file  . Highest education level: Not on file  Occupational History  . Occupation: LPN    Employer: [employer]  Tobacco Use  . Smoking status: Never Smoker  . Smokeless tobacco: Never Used  Vaping Use  . Vaping Use: Never used  Substance and Sexual Activity  . Alcohol use: No  . Drug use: No  . Sexual activity: Yes    Birth control/protection: Post-menopausal  Other Topics Concern  . Not on file  Social History Narrative   Right handed   Lives with husband one story home   Social Determinants of Health   Financial Resource Strain:   . Difficulty of Paying Living Expenses: Not on file  Food Insecurity:   . Worried About Charity fundraiser in the Last Year: Not on file  . Ran Out of Food in the Last Year: Not on file  Transportation Needs:   . Lack of Transportation (Medical): Not on file  . Lack of Transportation (Non-Medical): Not on file  Physical Activity:   . Days of Exercise per Week: Not on file  . Minutes of Exercise per Session: Not on file  Stress:   . Feeling of Stress : Not on file  Social Connections:   . Frequency of Communication with Friends and Family: Not on file  . Frequency of Social Gatherings with Friends and Family: Not on file  . Attends Religious Services: Not on file  . Active  Member of Clubs or Organizations: Not on file  . Attends Archivist Meetings: Not on file  . Marital Status: Not on file  Intimate Partner Violence:   . Fear of Current or Ex-Partner: Not on file  . Emotionally Abused: Not on file  . Physically Abused: Not on file  . Sexually Abused: Not on file    Objective:  Blood pressure (!) 161/75, pulse 81, height 5' 6"  (1.676 m), weight 136 lb 12.8 oz (62.1 kg), SpO2 98 %. General: No acute distress.  Patient appears well-groomed.   Head:  Normocephalic/atraumatic Eyes:  fundi examined but not visualized Neck: supple, no paraspinal tenderness, full range of motion Back: No paraspinal tenderness Heart: regular rate and rhythm Lungs: Clear to auscultation bilaterally. Vascular: No carotid bruits. Neurological Exam: Mental status: alert and oriented to person, place, and time, recent and remote memory intact, fund of knowledge intact, attention and concentration intact, speech fluent and not dysarthric, language intact. Cranial nerves: CN I: not tested CN II: pupils equal, round and reactive to light, visual fields intact CN III, IV, VI:  full range of motion, no nystagmus, no ptosis CN V: facial sensation intact. CN VII: upper and lower face symmetric CN VIII: hearing intact CN IX, X: gag intact, uvula midline CN XI: sternocleidomastoid and trapezius muscles intact CN XII: tongue midline Bulk & Tone: normal, no fasciculations. Motor:  muscle strength 5/5 throughout Sensation:  Pinprick, temperature and vibratory sensation intact. Deep Tendon Reflexes:  2+ throughout,  toes downgoing.   Finger to nose testing:  Without dysmetria.   Heel to shin:  Without dysmetria.   Gait:  Normal station and stride.  Romberg negative.  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  Titrate gabapentin to 357m three times daily and we can continue to titrate if needed. 2.  Follow up 4 to 6 months.    Thank you  for allowing me to take part in the care of this patient.  AMetta Clines DO  CC: JLovette ClicheII, MD

## 2020-04-03 NOTE — Progress Notes (Signed)
Date:  04/05/2020   ID:  Dominique, Terry 03/06/1941, MRN [id number]   PCP:  Angelina Sheriff, MD  Cardiologist:  Rasheedah Reis Martinique, MD  Electrophysiologist:  None   Evaluation Performed:  Follow-Up Visit  Chief Complaint:  CAD  History of Present Illness:    [name] is a 79 y.o. 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 Roxy Manns (LIMA-LAD, SVG-OM, SVG-PDA), postop atrial fibrillation, spastic dysphonia, carotid artery disease, hypertension and hyperlipidemia.  Cardiopulmonary stress test in 2017 showed no cardiac or ventilatory limitation.  ETT March 2018 showed hypertensive response.  Previous Myoview in April 2018 was low risk but no ischemia or scar.  She is intolerant of Crestor.  Patient was seen on 07/08/2018 at which time she complained of increasing dyspnea on exertion going up hills.  This is similar to her previous symptoms prior to CABG.  Echocardiogram obtained on 07/15/2018 revealed EF 60 to 65%, mild AI, moderately elevated RV pressure. Repeat Myoview was performed on 07/19/2018 which showed EF 67%, normal perfusion.  Her dyspnea on exertion was felt to be related to diastolic heart failure and she was placed on low-dose Lasix and potassium supplement.  Losartan was increased to 100 mg daily.  She was seen in our lipid clinic in June. Options for further lipid management were discussed. She was started on Repatha.   She presented to [facility] ED on August 11 with unstable angina. 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 Subsequent troponins were normal. Transferred to [facility] in [place] cardiac cath showing patent bypass grafts. This included a LIMA to the LAD, SVG to PDA and apparently SVG to diagonal (instead of OM as noted in op report).  She underwent PCI of the proximal LCx with a 2.75 x 12 mm Synergy stent. Echo was unremarkable except for moderate TR. She was started on Plavix.  I  reviewed films from her cardiac cath in [facility]. She has patent LIMA to the LAD, SVG to OM1, and SVG to PDA. There was a severe stenosis in the proximal LCx that supplies the second and third OMs distally. This stenosis was successfully stented. The mid to distal LCx has moderate diffuse disease.   On follow up today she has no recurrent chest pain. No palpitations or dyspnea. She did have a shingles outbreak in August involving her back and is still dealing with a lot of neuropathic pain. On gabapentin.    Past Medical History:  Diagnosis Date  . Anemia    "in my early teens"  . Arthritis    neck and back  . Atrial fibrillation ([place])    post-op after CABG;  treated with Amiodarone and coumadin until readmxn with CDiff colitis, perf colon and elevated LFTs (amio and coumadin stopped)  . C. difficile colitis 07/2438   complicated post CABG course with prolonged admission, perforated sigmoid colon;  s/p sigmoid colectomy with end colostomy [name] procedure);  laparotomy wound infection with Pseudomonas treated with antibxs and secondary intention  . Chronic kidney disease    hx kidney stones  . Coronary artery disease    a. NSTEMI 7/12, cath: pLAD 30%, mLAD 60-70%, dLAD 80%, oOM2 50%, mOM2 40%, trifurcation 80%, pRCA 25%, then occluded, L-R collats, EF 55%;   b. 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 Roxy Manns) // c. ETT 3/18: inf-lat ST depression of 1 mm at 4 min of exercise, frequent  ectopy and hypertensive BP response // d. Myoview 4/18: EF 68, no ischemia; low risk   . 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 GERD (gastroesophageal reflux disease)   . H/O hiatal hernia    "slight; not having problems with it"  . Hair loss    after heart surgery  . Hemorrhoids    "none since hemorrhoid OR"  . History of bronchitis    late 1980's  . Hyperlipidemia    takes Pravastatin daily  . Hypertension    takes Metoprolol daily  . Mitral valve prolapse    was told  this in the late 1990's; ;then with Dr.Mclean he states that she doesn't  . Myocardial infarction ([place]) 11/2010  . Palpitations 2003   hx of; Holter in 2003 with PACs and PVCs  . Pericarditis 1980   hx of  . 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 S/P colostomy ([place])    due to perf sigmoid colon in setting of CDiff colitis  . Seizures ([place])    "w/verapimil"  . Surgical wound infection    Pseudomonas; wound VAC; secondary intention  . 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 ABDOMINAL HYSTERECTOMY     partial hysterectomy  . APPENDECTOMY     as a child  . BOTOX INJECTION  12/03/2015   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  . CARDIAC CATHETERIZATION  11/21/10  . CATARACT EXTRACTION, BILATERAL    . CHOLECYSTECTOMY N/A 01/25/2016   Procedure: LAPAROSCOPIC CHOLECYSTECTOMY WITH INTRAOPERATIVE CHOLANGIOGRAM;  Surgeon: [name], MD;  Location: [facility];  Service: General;  Laterality: N/A;  . COLONOSCOPY    . COLOSTOMY RECONNECT    . colostomy reversal  06/27/11  . CORONARY ARTERY BYPASS GRAFT  11-25-2010   CABG X3; LIMA to LAD, SVG to PDA, SVG to OM, EVH via right thigh  . ESOPHAGOGASTRODUODENOSCOPY    . FLEXIBLE SIGMOIDOSCOPY  11/09/2011   Procedure: FLEXIBLE SIGMOIDOSCOPY;  Surgeon: Milus Banister, MD;  [facility];  Service: Endoscopy;  Laterality: N/A;  . HEMORRHOID SURGERY  ~ 2005   x 2  . OOPHORECTOMY  2005   left; w/"mass removal"  . PROCTOSCOPY  06/27/2011   Procedure: PROCTOSCOPY;  Surgeon: Gayland Curry, MD;  Location: [facility];  Service: General;  Laterality: N/A;  . SIGMOIDECTOMY  12/18/2010   with end colostomy  . TUBAL LIGATION       Current Meds  Medication Sig  . ascorbic acid (VITAMIN C) 1000 MG tablet Take by mouth.  Marland Kitchen aspirin 81 MG tablet Take 81 mg by mouth daily.  . Bacillus Coagulans-Inulin (PROBIOTIC-PREBIOTIC) 1-250 BILLION-MG CAPS Take 1 capsule by mouth daily.   . Calcium  Carb-Cholecalciferol (SM CALCIUM/VITAMIN D) 600-800 MG-UNIT TABS Take 1 tablet by mouth daily.  . clopidogrel (PLAVIX) 75 MG tablet Take 75 mg by mouth daily.  . Evolocumab (REPATHA SURECLICK) 409 MG/ML SOAJ Inject 140 mg into the skin every 14 (fourteen) days.  Marland Kitchen gabapentin (NEURONTIN) 100 MG capsule Take 1 capsule three times daily for one week, then 2 capsules three times daily for one week, then 3 capsules three times daily  . losartan (COZAAR) 50 MG tablet Take 1 tablet by mouth once daily  . mesalamine (APRISO) 0.375 g 24 hr capsule Take 1 capsule (0.375 g total) by mouth 2 (two) times daily.  . Multiple Vitamin (MULTI-VITAMIN) tablet Take by mouth.  Marland Kitchen  potassium chloride SA (KLOR-CON) 20 MEQ tablet Take 1 tablet (20 mEq total) by mouth daily.     Allergies:   Apple, Nadolol, Other, Sulfa antibiotics, Sulfamethoxazole, Sulfonamide derivatives, Verapamil, Crestor [rosuvastatin], and Flagyl [metronidazole]   Social History   Tobacco Use  . Smoking status: Never Smoker  . Smokeless tobacco: Never Used  Vaping Use  . Vaping Use: Never used  Substance Use Topics  . Alcohol use: No  . Drug use: No     Family Hx: The patient's family history includes Coronary artery disease in her mother; Heart attack (age of onset: 70) in her father; Heart disease in her father and mother. There is no history of Anesthesia problems, Hypotension, Malignant hyperthermia, Pseudochol deficiency, Colon cancer, Esophageal cancer, Stomach cancer, or Rectal cancer.  ROS:   Please see the history of present illness.    All other systems reviewed and are negative.   Prior CV studies:   The following studies were reviewed today:  Echo 07/17/18: IMPRESSIONS    1. The left ventricle has normal systolic function with an ejection fraction of 60-65%. The cavity size was normal. Left ventricular diastolic Doppler parameters are consistent with pseudonormalization Elevated left atrial and left ventricular   end-diastolic pressures.  2. The right ventricle has normal systolic function. The cavity was normal. There is no increase in right ventricular wall thickness. Right ventricular systolic pressure is moderately elevated with an estimated pressure of 43.7 mmHg.  3. The mitral valve is degenerative. Moderate thickening of the mitral valve leaflets with moderate involvement of the chordae tendinae. Mild calcification of the mitral valve leaflet.  4. The tricuspid valve is normal in structure.  5. The aortic valve is tricuspid Aortic valve regurgitation is mild by color flow Doppler.  6. The pulmonic valve was normal in structure.  7. The inferior vena cava was dilated in size with <50% respiratory variability.  8. The interatrial septum appears to be lipomatous.   Myoview 07/19/2018 Study Highlights     The left ventricular ejection fraction is hyperdynamic (>65%).  Nuclear stress EF: 67%.  No T wave inversion was noted during stress.  There was no ST segment deviation noted during stress.  The study is normal.  This is a low risk study.  Normal perfusion. LVEF 67% with normal wall motion. This is a low risk study. No change compared to prior study in 2018.     Labs/Other Tests and Data Reviewed:    EKG:  None today.   Recent Labs: 06/11/2019: ALT 14   Recent Lipid Panel Lab Results  Component Value Date/Time   CHOL 115 12/23/2019 08:50 AM   TRIG 94 12/23/2019 08:50 AM   HDL 57 12/23/2019 08:50 AM   CHOLHDL 2.0 12/23/2019 08:50 AM   CHOLHDL 3 04/13/2014 09:34 AM   LDLCALC 40 12/23/2019 08:50 AM   LDLDIRECT 81.8 07/20/2011 03:04 PM   Dated 07/02/18: cholesterol 161, triglycerides 81, HDL 52, LDL 93. CMET and TSH normal  Dated 12/24/19: BNP 300. Glucose 144, BUN 22, otherwise CMET normal. plts 132K. WBC 3.2K.   Wt Readings from Last 3 Encounters:  04/05/20 138 lb 6.4 oz (62.8 kg)  04/01/20 136 lb 12.8 oz (62.1 kg)  12/29/19 137 lb 9.6 oz (62.4 kg)     Objective:     Vital Signs:  BP 125/60   Pulse 76   Temp 97.7 F (36.5 C)   Ht 5' 6"  (1.676 m)   Wt 138 lb 6.4 oz (62.8 kg)  SpO2 97%   BMI 22.34 kg/m    GENERAL:  Well appearing WF in NAD HEENT:  PERRL, EOMI, sclera are clear. Oropharynx is clear. NECK:  No jugular venous distention, carotid upstroke brisk and symmetric, no bruits, no thyromegaly or adenopathy LUNGS:  Clear to auscultation bilaterally CHEST:  Unremarkable HEART:  RRR,  PMI not displaced or sustained,S1 and S2 within normal limits, no S3, no S4: no clicks, no rubs, no murmurs ABD:  Soft, nontender. BS +, no masses or bruits. No hepatomegaly, no splenomegaly EXT:  2 + pulses throughout, no edema, no cyanosis no clubbing. She has some bruising at her right groin site but no hematoma. SKIN:  Warm and dry.  No rashes NEURO:  Alert and oriented x 3. Cranial nerves II through XII intact. PSYCH:  Cognitively intact    ASSESSMENT & PLAN:    1.  CAD s/p CABG x 3 in 2012 for multivessel CAD. Normal Myoview in May 2020. Now with  episode of unstable angina in August. S/p DES of the proximal LCx. Patent grafts.  Now on ASA and Plavix for one year.   2. Hypercholesterolemia. intolerant of statins. On Zetia. Now on Repatha with excellent response LDL 40.   3. Carotid arterial disease. 60-79% RICA stenosis. No change in Carotid dopplers April 2021  4. HTN controlled.   5. Zoster neuralgia.     Medication Adjustments/Labs and Tests Ordered: Current medicines are reviewed at length with the patient today.  Concerns regarding medicines are outlined above.   Tests Ordered: No orders of the defined types were placed in this encounter.   Medication Changes: No orders of the defined types were placed in this encounter.   Follow Up:  6 months   Signed, Saylor Sheckler Martinique, MD  04/05/2020 10:48 AM    [facility]  Addendum    Asencion Guisinger Martinique MD, [facility]

## 2020-04-05 ENCOUNTER — Other Ambulatory Visit: Payer: Self-pay

## 2020-04-05 ENCOUNTER — Encounter: Payer: Self-pay | Admitting: Cardiology

## 2020-04-05 ENCOUNTER — Ambulatory Visit: Payer: PPO | Admitting: Cardiology

## 2020-04-05 VITALS — BP 125/60 | HR 76 | Temp 97.7°F | Ht 66.0 in | Wt 138.4 lb

## 2020-04-05 DIAGNOSIS — E785 Hyperlipidemia, unspecified: Secondary | ICD-10-CM | POA: Diagnosis not present

## 2020-04-05 DIAGNOSIS — I6523 Occlusion and stenosis of bilateral carotid arteries: Secondary | ICD-10-CM | POA: Diagnosis not present

## 2020-04-05 DIAGNOSIS — I10 Essential (primary) hypertension: Secondary | ICD-10-CM

## 2020-04-05 DIAGNOSIS — I25700 Atherosclerosis of coronary artery bypass graft(s), unspecified, with unstable angina pectoris: Secondary | ICD-10-CM

## 2020-04-14 DIAGNOSIS — Z20828 Contact with and (suspected) exposure to other viral communicable diseases: Secondary | ICD-10-CM | POA: Diagnosis not present

## 2020-04-14 DIAGNOSIS — Z6822 Body mass index (BMI) 22.0-22.9, adult: Secondary | ICD-10-CM | POA: Diagnosis not present

## 2020-04-14 DIAGNOSIS — J40 Bronchitis, not specified as acute or chronic: Secondary | ICD-10-CM | POA: Diagnosis not present

## 2020-04-14 DIAGNOSIS — J329 Chronic sinusitis, unspecified: Secondary | ICD-10-CM | POA: Diagnosis not present

## 2020-04-20 DIAGNOSIS — H532 Diplopia: Secondary | ICD-10-CM | POA: Diagnosis not present

## 2020-04-20 DIAGNOSIS — H35373 Puckering of macula, bilateral: Secondary | ICD-10-CM | POA: Diagnosis not present

## 2020-04-20 DIAGNOSIS — H43393 Other vitreous opacities, bilateral: Secondary | ICD-10-CM | POA: Diagnosis not present

## 2020-04-20 DIAGNOSIS — H524 Presbyopia: Secondary | ICD-10-CM | POA: Diagnosis not present

## 2020-04-20 DIAGNOSIS — Z961 Presence of intraocular lens: Secondary | ICD-10-CM | POA: Diagnosis not present

## 2020-04-20 DIAGNOSIS — D3131 Benign neoplasm of right choroid: Secondary | ICD-10-CM | POA: Diagnosis not present

## 2020-04-20 DIAGNOSIS — H35362 Drusen (degenerative) of macula, left eye: Secondary | ICD-10-CM | POA: Diagnosis not present

## 2020-05-11 ENCOUNTER — Other Ambulatory Visit: Payer: Self-pay

## 2020-05-11 DIAGNOSIS — I6523 Occlusion and stenosis of bilateral carotid arteries: Secondary | ICD-10-CM

## 2020-05-24 ENCOUNTER — Ambulatory Visit (HOSPITAL_COMMUNITY)
Admission: RE | Admit: 2020-05-24 | Discharge: 2020-05-24 | Disposition: A | Discharge status: Home / Self Care | Payer: PPO | Source: Ambulatory Visit | Attending: Physician Assistant | Admitting: Physician Assistant

## 2020-05-24 ENCOUNTER — Ambulatory Visit: Payer: PPO | Admitting: Physician Assistant

## 2020-05-24 ENCOUNTER — Other Ambulatory Visit: Payer: Self-pay

## 2020-05-24 VITALS — BP 115/71 | HR 63 | Temp 98.6°F | Resp 16 | Ht 66.0 in | Wt 135.0 lb

## 2020-05-24 DIAGNOSIS — I6523 Occlusion and stenosis of bilateral carotid arteries: Secondary | ICD-10-CM

## 2020-05-24 NOTE — Progress Notes (Signed)
Office Note     CC:  follow up Requesting Provider:  Angelina Sheriff, MD  HPI: [name] is a 80 y.o. (1941-04-26) female who presents for routine surveillance of carotid stenosis.     She had a duplex in 2012 which revealed moderate bilateral stenosis. 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  She denies any known history of stroke or TIA. Specificallyshe deniesa history of amaurosis fugax or monocular blindness, unilateral facial drooping, hemiplegia, orreceptive or expressive aphasia.She denies claudication or rest pain.  She has a hx of CAD with STEMI in 2012 and underwent CABG. She developed Afib postoperatively, which was treated with amiodarone and coumadin. She underwent coronary stent placement this past summer and was placed on Plavix.  She is compliant with aspirin, Plavix and Zetia. She is not diabetic. She takes ARB and diuretic for hypertension. She does not use tobacco products Past Medical History:  Diagnosis Date  . Anemia    "in my early teens"  . Arthritis    neck and back  . Atrial fibrillation ([place])    post-op after CABG;  treated with Amiodarone and coumadin until readmxn with CDiff colitis, perf colon and elevated LFTs (amio and coumadin stopped)  . C. difficile colitis 01/3715   complicated post CABG course with prolonged admission, perforated sigmoid colon;  s/p sigmoid colectomy with end colostomy [name] procedure);  laparotomy wound infection with Pseudomonas treated with antibxs and secondary intention  . Chronic kidney disease    hx kidney stones  . Coronary artery disease    a. NSTEMI 7/12, cath: pLAD 30%, mLAD 60-70%, dLAD 80%, oOM2 50%, mOM2 40%, trifurcation 80%, pRCA 25%, then occluded, L-R collats, EF 55%;   b. 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 Roxy Manns) // c. ETT 3/18: inf-lat ST depression of 1 mm at 4 min of exercise, frequent ectopy and hypertensive BP response // d. Myoview 4/18:  EF 68, no ischemia; low risk   . 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 GERD (gastroesophageal reflux disease)   . H/O hiatal hernia    "slight; not having problems with it"  . Hair loss    after heart surgery  . Hemorrhoids    "none since hemorrhoid OR"  . History of bronchitis    late 1980's  . Hyperlipidemia    takes Pravastatin daily  . Hypertension    takes Metoprolol daily  . Mitral valve prolapse    was told this in the late 1990's; ;then with Dr.Mclean he states that she doesn't  . Myocardial infarction ([place]) 11/2010  . Palpitations 2003   hx of; Holter in 2003 with PACs and PVCs  . Pericarditis 1980   hx of  . 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 S/P colostomy ([place])    due to perf sigmoid colon in setting of CDiff colitis  . Seizures ([place])    "w/verapimil"  . Surgical wound infection    Pseudomonas; wound VAC; secondary intention  . 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 ABDOMINAL HYSTERECTOMY     partial hysterectomy  . APPENDECTOMY     as a child  . BOTOX INJECTION  12/03/2015   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  . CARDIAC CATHETERIZATION  11/21/10  . CATARACT EXTRACTION, BILATERAL    . CHOLECYSTECTOMY N/A 01/25/2016   Procedure: LAPAROSCOPIC CHOLECYSTECTOMY WITH  INTRAOPERATIVE CHOLANGIOGRAM;  Surgeon: [name], MD;  [facility];  Service: General;  Laterality: N/A;  . COLONOSCOPY    . COLOSTOMY RECONNECT    . colostomy reversal  06/27/11  . CORONARY ARTERY BYPASS GRAFT  11-25-2010   CABG X3; LIMA to LAD, SVG to PDA, SVG to OM, EVH via right thigh  . ESOPHAGOGASTRODUODENOSCOPY    . FLEXIBLE SIGMOIDOSCOPY  11/09/2011   Procedure: FLEXIBLE SIGMOIDOSCOPY;  Surgeon: Milus Banister, MD;  [facility];  Service: Endoscopy;  Laterality: N/A;  . HEMORRHOID SURGERY  ~ 2005   x 2  . OOPHORECTOMY  2005   left; w/"mass removal"  . PROCTOSCOPY  06/27/2011    Procedure: PROCTOSCOPY;  Surgeon: Gayland Curry, MD;  Location: [facility];  Service: General;  Laterality: N/A;  . SIGMOIDECTOMY  12/18/2010   with end colostomy  . TUBAL LIGATION      Social History   Socioeconomic History  . Marital status: Married    Spouse name: Not on file  . Number of children: 1  . Years of education: Not on file  . Highest education level: Not on file  Occupational History  . Occupation: LPN    Employer: [employer]  Tobacco Use  . Smoking status: Never Smoker  . Smokeless tobacco: Never Used  Vaping Use  . Vaping Use: Never used  Substance and Sexual Activity  . Alcohol use: No  . Drug use: No  . Sexual activity: Yes    Birth control/protection: Post-menopausal  Other Topics Concern  . Not on file  Social History Narrative   Right handed   Lives with husband one story home   Social Determinants of Health   Financial Resource Strain: Not on file  Food Insecurity: Not on file  Transportation Needs: Not on file  Physical Activity: Not on file  Stress: Not on file  Social Connections: Not on file  Intimate Partner Violence: Not on file   Family History  Problem Relation Age of Onset  . Heart attack Father 76  . Heart disease Father        massive heart attack  . Coronary artery disease Mother        [place] bypass [place]  . Heart disease Mother   . Anesthesia problems Neg Hx   . Hypotension Neg Hx   . Malignant hyperthermia Neg Hx   . Pseudochol deficiency Neg Hx   . Colon cancer Neg Hx   . Esophageal cancer Neg Hx   . Stomach cancer Neg Hx   . Rectal cancer Neg Hx     Current Outpatient Medications  Medication Sig Dispense Refill  . ascorbic acid (VITAMIN C) 1000 MG tablet Take by mouth.    Marland Kitchen aspirin 81 MG tablet Take 81 mg by mouth daily.    . Bacillus Coagulans-Inulin (PROBIOTIC-PREBIOTIC) 1-250 BILLION-MG CAPS Take 1 capsule by mouth daily.     . Calcium Carb-Cholecalciferol (SM CALCIUM/VITAMIN D) 600-800 MG-UNIT TABS Take 1  tablet by mouth daily.    . clopidogrel (PLAVIX) 75 MG tablet Take 75 mg by mouth daily.    . Evolocumab (REPATHA SURECLICK) 361 MG/ML SOAJ Inject 140 mg into the skin every 14 (fourteen) days. 2 pen 11  . ezetimibe (ZETIA) 10 MG tablet Take 1 tablet (10 mg total) by mouth daily. 90 tablet 3  . furosemide (LASIX) 20 MG tablet Take 1 tablet (20 mg total) by mouth daily. 90 tablet 3  . gabapentin (NEURONTIN) 100  MG capsule Take 1 capsule three times daily for one week, then 2 capsules three times daily for one week, then 3 capsules three times daily 270 capsule 0  . losartan (COZAAR) 50 MG tablet Take 1 tablet by mouth once daily 90 tablet 0  . mesalamine (APRISO) 0.375 g 24 hr capsule Take 1 capsule (0.375 g total) by mouth 2 (two) times daily. 60 capsule 6  . Multiple Vitamin (MULTI-VITAMIN) tablet Take by mouth.    . potassium chloride SA (KLOR-CON) 20 MEQ tablet Take 1 tablet (20 mEq total) by mouth daily. 90 tablet 3   No current facility-administered medications for this visit.    Allergies  Allergen Reactions  . Apple Anaphylaxis  . Nadolol Hives and Anaphylaxis    Rash and nausea  . Other Other (See Comments) and Anaphylaxis    apples Apples-anaphylaxis  . Sulfa Antibiotics Hives and Anaphylaxis  . Sulfamethoxazole Anaphylaxis  . Sulfonamide Derivatives Anaphylaxis  . Verapamil Other (See Comments) and Anaphylaxis    seizures  . Crestor [Rosuvastatin] Other (See Comments)    Myalgias   . Flagyl [Metronidazole] Hives     REVIEW OF SYSTEMS:   [X]  denotes positive finding, [ ]  denotes negative finding Cardiac  Comments:  Chest pain or chest pressure:    Shortness of breath upon exertion:    Short of breath when lying flat:    Irregular heart rhythm:        Vascular    Pain in calf, thigh, or hip brought on by ambulation:    Pain in feet at night that wakes you up from your sleep:     Blood clot in your veins:    Leg swelling:         Pulmonary    Oxygen at home:     Productive cough:     Wheezing:         Neurologic    Sudden weakness in arms or legs:     Sudden numbness in arms or legs:     Sudden onset of difficulty speaking or slurred speech: x  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  Temporary loss of vision in one eye:     Problems with dizziness:         Gastrointestinal    Blood in stool:     Vomited blood:         Genitourinary    Burning when urinating:     Blood in urine:        Psychiatric    Major depression:         Hematologic    Bleeding problems:    Problems with blood clotting too easily:        Skin    Rashes or ulcers:        Constitutional    Fever or chills:      PHYSICAL EXAMINATION:  Vitals:   05/24/20 1033  Resp: 16  Weight: 135 lb (61.2 kg)  Height: 5' 6"  (1.676 m)    General:  WDWN in NAD; vital signs documented above Gait: unaided, no ataxia HENT: WNL, normocephalic Pulmonary: normal non-labored breathing , without Rales, rhonchi,  wheezing Cardiac: regular HR, without  Murmurs; with right carotid bruit Abdomen: soft, NT, no masses Skin: without rashes Vascular Exam/Pulses: 2+ symmetrical radial pulses Extremities: without ischemic changes, without Gangrene , without cellulitis; without open wounds; 2+ PT pulses Musculoskeletal: no muscle wasting or atrophy  Neurologic: A&O X 3;  No focal weakness or paresthesias  are detected. Chronic dysphonia. Psychiatric:  The pt has Normal affect.   Non-Invasive Vascular Imaging:   05/24/2020: Right Carotid: Velocities in the right ICA are consistent with a 60-79% stenosis. The ECA appears >50% stenosed.   Left Carotid: Velocities in the left ICA are consistent with a 1-39%  stenosis.   Vertebrals: Bilateral vertebral arteries demonstrate antegrade flow.  Subclavians: Normal flow hemodynamics were seen in bilateral subclavian arteries.   11/25/2018: Right Carotid: Velocities in the right ICA are consistent with a 60-79% stenosis.   Left  Carotid: Velocities in the left ICA are consistent with a 1-39% stenosis.   Vertebrals: Bilateral vertebral arteries demonstrate antegrade flow.  Subclavians: Normal flow hemodynamics were seen in bilateral subclavian arteries.   ASSESSMENT/PLAN:: 80 y.o. female here for follow up for carotid artery stenosis.  She is asymptomatic.  Duplex ultrasound is unchanged from July 2020.  We reviewed signs and symptoms of stroke/TIA and I encouraged her to call EMS should these symptoms arise.  Continue medications as prescribed.  Explained need for follow-up every 6 months due to 60 to 79% stenosis in the right ICA.    Barbie Banner, PA-C [facility] [phone]  Clinic MD:   Trula Slade

## 2020-05-26 ENCOUNTER — Other Ambulatory Visit: Payer: Self-pay

## 2020-05-26 DIAGNOSIS — I6523 Occlusion and stenosis of bilateral carotid arteries: Secondary | ICD-10-CM

## 2020-06-16 ENCOUNTER — Other Ambulatory Visit: Payer: Self-pay | Admitting: Cardiology

## 2020-07-12 ENCOUNTER — Other Ambulatory Visit: Payer: Self-pay | Admitting: Cardiology

## 2020-07-12 DIAGNOSIS — Z20822 Contact with and (suspected) exposure to covid-19: Secondary | ICD-10-CM | POA: Diagnosis not present

## 2020-07-12 DIAGNOSIS — J40 Bronchitis, not specified as acute or chronic: Secondary | ICD-10-CM | POA: Diagnosis not present

## 2020-07-12 DIAGNOSIS — M1990 Unspecified osteoarthritis, unspecified site: Secondary | ICD-10-CM | POA: Diagnosis not present

## 2020-07-12 DIAGNOSIS — J329 Chronic sinusitis, unspecified: Secondary | ICD-10-CM | POA: Diagnosis not present

## 2020-07-16 DIAGNOSIS — J302 Other seasonal allergic rhinitis: Secondary | ICD-10-CM | POA: Diagnosis not present

## 2020-07-16 DIAGNOSIS — J329 Chronic sinusitis, unspecified: Secondary | ICD-10-CM | POA: Diagnosis not present

## 2020-07-16 DIAGNOSIS — J40 Bronchitis, not specified as acute or chronic: Secondary | ICD-10-CM | POA: Diagnosis not present

## 2020-08-24 ENCOUNTER — Ambulatory Visit: Payer: PPO | Admitting: Gastroenterology

## 2020-08-24 ENCOUNTER — Encounter: Payer: Self-pay | Admitting: Gastroenterology

## 2020-08-24 VITALS — BP 130/60 | HR 53 | Ht 66.0 in | Wt 133.0 lb

## 2020-08-24 DIAGNOSIS — K51919 Ulcerative colitis, unspecified with unspecified complications: Secondary | ICD-10-CM | POA: Diagnosis not present

## 2020-08-24 NOTE — Patient Instructions (Addendum)
If you are age 78 or older, your body mass index should be between 23-30. Your Body mass index is 21.47 kg/m. If this is out of the aforementioned range listed, please consider follow up with your Primary Care Provider.  You will follow up in our office in 1 year (April 2023).  We will contact you to schedule the appointment.  Please call our office if you have any GI tract issues before your next appointment.  Thank you for entrusting me with your care and choosing [facility].  Dr Ardis Hughs

## 2020-08-24 NOTE — Progress Notes (Signed)
Review of pertinent gastrointestinal problems:  1. C. Diff coltis, eventually perforated sigmoid,s/p segmental colectomy, reversed 2/13.  2. 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 She underwent 3-4 sequential sigmoidoscopies with dilation of the stricture. Eventually dilated up to 20 mm with very good results in her diarrhea. Last examination was August 2013, this was a full colonoscopy. No polyps were seen. She did have proximal to the site of the previous stricture, the colon was somewhat edematous.  3. routine risk for colon cancer,next colonoscopy August 2023  4. Chronic colitis,July 2014 colonoscopy (mildly inflamed colon throughout, biopsies confirmed chronic inflammation). This was done for diarrhea. CC anastomosis at the time was open to 97m, repeat dilation performed. Pred 218mdaily was started. Hot flashes, racing heart and so changed to uceris 76m18mer day. Sept 2014; started mesalamine 4.8 grams per day, with good response (apriso).  Tapered to 2 pills once daily 2021.   HPI: This is a very pleasant 80 3ar old woman  I last saw her about a year ago.  She tapered from 4 pills of Apriso daily down to 2 pills and sprays of daily and she has done very well.  No worsening of her GI issues.  She has 1-2 bowel movements daily.  Very rarely she will have to strain.  She never sees bleeding.  No abdominal pains.  Her weight is slightly down in the past 6 months.  Colon cancer does not run in her family  ROS: complete GI ROS as described in HPI, all other review negative.  Constitutional:  No unintentional weight loss   Past Medical History:  Diagnosis Date  . Anemia    "in my early teens"  . Arthritis    neck and back  . Atrial fibrillation ([facility]  post-op after CABG;  treated with Amiodarone and coumadin until readmxn with CDiff colitis, perf colon and elevated LFTs (amio and coumadin stopped)  . C. difficile colitis 01/03/6072 complicated post CABG course with prolonged admission, perforated sigmoid colon;  s/p sigmoid colectomy with end colostomy (HaJeanette Capriceocedure);  laparotomy wound infection with Pseudomonas treated with antibxs and secondary intention  . Chronic kidney disease    hx kidney stones  . Coronary artery disease    a. NSTEMI 7/12, cath: pLAD 30%, mLAD 60-70%, dLAD 80%, oOM2 50%, mOM2 40%, trifurcation 80%, pRCA 25%, then occluded, L-R collats, EF 55%;   b. 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 OweRoxy Manns/ c. ETT 3/18: inf-lat ST depression of 1 mm at 4 min of exercise, frequent ectopy and hypertensive BP response // d. Myoview 4/18: EF 68, no ischemia; low risk   . 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 GERD (gastroesophageal reflux disease)   . H/O hiatal hernia    "slight; not having problems with it"  . Hair loss    after heart surgery  . Hemorrhoids    "none since hemorrhoid OR"  . History of bronchitis    late 1980's  . Hyperlipidemia    takes Pravastatin daily  . Hypertension    takes Metoprolol daily  . Mitral valve prolapse    was told this in the late 1990's; ;then with Dr.Mclean he states that she doesn't  . Myocardial infarction ([facility]/2012  . Palpitations 2003   hx of; Holter in 2003 with PACs and PVCs  . Pericarditis 1980   hx of  . 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  S/P colostomy ([place])    due to perf sigmoid colon in setting of CDiff colitis  . Seizures ([place])    "w/verapimil"  . Surgical wound infection    Pseudomonas; wound VAC; secondary intention  . 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 ABDOMINAL HYSTERECTOMY     partial hysterectomy  . APPENDECTOMY     as a child  . BOTOX INJECTION  12/03/2015   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  . CARDIAC CATHETERIZATION  11/21/10  . CATARACT EXTRACTION, BILATERAL    . CHOLECYSTECTOMY N/A 01/25/2016   Procedure: LAPAROSCOPIC  CHOLECYSTECTOMY WITH INTRAOPERATIVE CHOLANGIOGRAM;  Surgeon: [name], MD;  Location: [facility];  Service: General;  Laterality: N/A;  . COLONOSCOPY    . COLOSTOMY RECONNECT    . colostomy reversal  06/27/11  . CORONARY ANGIOPLASTY WITH STENT PLACEMENT    . CORONARY ARTERY BYPASS GRAFT  11-25-2010   CABG X3; LIMA to LAD, SVG to PDA, SVG to OM, EVH via right thigh  . ESOPHAGOGASTRODUODENOSCOPY    . FLEXIBLE SIGMOIDOSCOPY  11/09/2011   Procedure: FLEXIBLE SIGMOIDOSCOPY;  Surgeon: Milus Banister, MD;  Location: [facility];  Service: Endoscopy;  Laterality: N/A;  . HEMORRHOID SURGERY  ~ 2005   x 2  . OOPHORECTOMY  2005   left; w/"mass removal"  . PROCTOSCOPY  06/27/2011   Procedure: PROCTOSCOPY;  Surgeon: Gayland Curry, MD;  Location: [facility];  Service: General;  Laterality: N/A;  . SIGMOIDECTOMY  12/18/2010   with end colostomy  . TUBAL LIGATION      Current Outpatient Medications  Medication Sig Dispense Refill  . ascorbic acid (VITAMIN C) 1000 MG tablet Take by mouth.    Marland Kitchen aspirin 81 MG tablet Take 81 mg by mouth daily.    . Bacillus Coagulans-Inulin (PROBIOTIC-PREBIOTIC) 1-250 BILLION-MG CAPS Take 1 capsule by mouth daily.    . Calcium Carb-Cholecalciferol 600-800 MG-UNIT TABS Take 1 tablet by mouth daily.    . clopidogrel (PLAVIX) 75 MG tablet Take 75 mg by mouth daily.    . Evolocumab (REPATHA SURECLICK) 161 MG/ML SOAJ Inject 140 mg into the skin every 14 (fourteen) days. 2 pen 11  . losartan (COZAAR) 50 MG tablet Take 1 tablet by mouth once daily 90 tablet 0  . mesalamine (APRISO) 0.375 g 24 hr capsule Take 1 capsule (0.375 g total) by mouth 2 (two) times daily. 60 capsule 6  . Multiple Vitamin (MULTI-VITAMIN) tablet Take by mouth.    . potassium chloride SA (KLOR-CON) 20 MEQ tablet Take 1 tablet by mouth once daily 90 tablet 0  . ezetimibe (ZETIA) 10 MG tablet Take 1 tablet (10 mg total) by mouth daily. 90 tablet 3  . furosemide (LASIX) 20 MG tablet Take 1 tablet (20 mg total) by  mouth daily. 90 tablet 3   No current facility-administered medications for this visit.    Allergies as of 08/24/2020 - Review Complete 08/24/2020  Allergen Reaction Noted  . Apple Anaphylaxis 11/11/2019  . Nadolol Hives and Anaphylaxis 08/02/2009  . Other Other (See Comments) and Anaphylaxis 06/22/2011  . Sulfa antibiotics Hives and Anaphylaxis 11/20/2011  . Sulfamethoxazole Anaphylaxis 05/03/2015  . Sulfonamide derivatives Anaphylaxis   . Verapamil Other (See Comments) and Anaphylaxis   . Crestor [rosuvastatin] Other (See Comments) 06/19/2019  . Flagyl [metronidazole] Hives 04/30/2018    Family History  Problem Relation Age of Onset  . Heart attack Father 44  . Heart disease  Father        massive heart attack  . Coronary artery disease Mother        had bypass in the [place]  . Heart disease Mother   . Anesthesia problems Neg Hx   . Hypotension Neg Hx   . Malignant hyperthermia Neg Hx   . Pseudochol deficiency Neg Hx   . Colon cancer Neg Hx   . Esophageal cancer Neg Hx   . Stomach cancer Neg Hx   . Rectal cancer Neg Hx     Social History   Socioeconomic History  . Marital status: Married    Spouse name: Not on file  . Number of children: 1  . Years of education: Not on file  . Highest education level: Not on file  Occupational History  . Occupation: LPN    Employer: [employer]  Tobacco Use  . Smoking status: Never Smoker  . Smokeless tobacco: Never Used  Vaping Use  . Vaping Use: Never used  Substance and Sexual Activity  . Alcohol use: No  . Drug use: No  . Sexual activity: Yes    Birth control/protection: Post-menopausal  Other Topics Concern  . Not on file  Social History Narrative   Right handed   Lives with husband one story home   Social Determinants of Health   Financial Resource Strain: Not on file  Food Insecurity: Not on file  Transportation Needs: Not on file  Physical Activity: Not on file  Stress: Not on file  Social  Connections: Not on file  Intimate Partner Violence: Not on file     Physical Exam: BP 130/60   Pulse (!) 53   Ht 5' 6"  (1.676 m)   Wt 133 lb (60.3 kg)   BMI 21.47 kg/m  Constitutional: generally well-appearing Psychiatric: alert and oriented x3 Abdomen: soft, nontender, nondistended, no obvious ascites, no peritoneal signs, normal bowel sounds No peripheral edema noted in lower extremities  Assessment and plan: 80 y.o. female with chronic colitis, complicated diverticulitis remotely  She is doing very well on lowered Apriso dose 2 pills once daily.  We discussed the fact that she was diagnosed with inflammation in her colon 10 years ago and this probably puts her at slightly increased risk for colon cancer.  Her last full colonoscopy was about 9 years ago.  She is however 80 years old and feeling fine.  She is not interested in screening colonoscopy at this point and I think that that is probably very safe.  She knows to call if she has any concerning changes in her bowels prior to standard routine follow-up in 1 year for her colitis.  Please see the "Patient Instructions" section for addition details about the plan.  Owens Loffler, MD [facility] 08/24/2020, 9:05 AM   Total time on date of encounter was 30 minutes (this included time spent preparing to see the patient reviewing records; obtaining and/or reviewing separately obtained history; performing a medically appropriate exam and/or evaluation; counseling and educating the patient and family if present; ordering medications, tests or procedures if applicable; and documenting clinical information in the health record).

## 2020-08-27 ENCOUNTER — Other Ambulatory Visit: Payer: Self-pay | Admitting: Cardiology

## 2020-09-13 ENCOUNTER — Other Ambulatory Visit: Payer: Self-pay | Admitting: Cardiology

## 2020-10-01 ENCOUNTER — Ambulatory Visit: Payer: PPO | Admitting: Neurology

## 2020-10-01 NOTE — Progress Notes (Signed)
Date:  10/04/2020   ID:  Dominique, Terry [birth year], MRN [id number]   PCP:  Angelina Sheriff, MD  Cardiologist:  Dal Blew Martinique, MD  Electrophysiologist:  None   Evaluation Performed:  Follow-Up Visit  Chief Complaint:  CAD  History of Present Illness:    [name] is a 80 y.o. 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 Roxy Manns (LIMA-LAD, SVG-OM, SVG-PDA), postop atrial fibrillation, spastic dysphonia, carotid artery disease, hypertension and hyperlipidemia.  Cardiopulmonary stress test in 2017 showed no cardiac or ventilatory limitation.  ETT March 2018 showed hypertensive response.  Previous Myoview in April 2018 was low risk but no ischemia or scar.  She is intolerant of Crestor.  Patient was seen on 07/08/2018 at which time she complained of increasing dyspnea on exertion going up hills.  This is similar to her previous symptoms prior to CABG.  Echocardiogram obtained on 07/15/2018 revealed EF 60 to 65%, mild AI, moderately elevated RV pressure. Repeat Myoview was performed on 07/19/2018 which showed EF 67%, normal perfusion.  Her dyspnea on exertion was felt to be related to diastolic heart failure and she was placed on low-dose Lasix and potassium supplement.  Losartan was increased to 100 mg daily.  She was seen in our lipid clinic in June. Options for further lipid management were discussed. She was started on Repatha.   She presented to [facility] ED on December 24, 2019 with unstable angina.  Subsequent troponins were normal. Transferred to [facility] in [place] cardiac cath showing patent bypass grafts. This included a LIMA to the LAD, SVG to PDA and apparently SVG to diagonal (instead of OM as noted in op report).  She underwent PCI of the proximal LCx with a 2.75 x 12 mm Synergy stent. Echo was unremarkable except for moderate TR. She was started on Plavix.  I reviewed films from her cardiac cath in [facility]. She has patent LIMA to the LAD,  SVG to OM1, and SVG to PDA. There was a severe stenosis in the proximal LCx that supplies the second and third OMs distally. This stenosis was successfully stented. The mid to distal LCx has moderate diffuse disease.   On follow up today she reports that yesterday and today she felt washed out and noted BP down to 97/68. Felt better after 10-15 minutes of lying down. Notes lasix makes her feels weak. Denies any edema or change in weight. Does complain of episodic flutters in her chest. Also has mid sternal chest pain similar to symptoms she had last year but only last 30 seconds.    Past Medical History:  Diagnosis Date  . Anemia    "in my early teens"  . Arthritis    neck and back  . Atrial fibrillation ([place])    post-op after CABG;  treated with Amiodarone and coumadin until readmxn with CDiff colitis, perf colon and elevated LFTs (amio and coumadin stopped)  . C. difficile colitis 11/8240   complicated post CABG course with prolonged admission, perforated sigmoid colon;  s/p sigmoid colectomy with end colostomy [name] procedure);  laparotomy wound infection with Pseudomonas treated with antibxs and secondary intention  . Chronic kidney disease    hx kidney stones  . Coronary artery disease    a. NSTEMI 7/12, cath: pLAD 30%, mLAD 60-70%, dLAD 80%, oOM2 50%, mOM2 40%, trifurcation 80%, pRCA 25%, then occluded, L-R collats, EF 55%;   b. s/p CABG: L-LAD, S-D1, S-PDA, OM1  endarterectomy (Dr. Roxy Manns) // c. ETT 3/18: inf-lat ST depression of 1 mm at 4 min of exercise, frequent ectopy and hypertensive BP response // d. Myoview 4/18: EF 68, no ischemia; low risk   . 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 GERD (gastroesophageal reflux disease)   . H/O hiatal hernia    "slight; not having problems with it"  . Hair loss    after heart surgery  . Hemorrhoids    "none since hemorrhoid OR"  . History of bronchitis    late 1980's  . Hyperlipidemia    takes Pravastatin daily  .  Hypertension    takes Metoprolol daily  . Mitral valve prolapse    was told this in the late 1990's; ;then with Dr.Mclean he states that she doesn't  . Myocardial infarction ([place]) 11/2010  . Palpitations 2003   hx of; Holter in 2003 with PACs and PVCs  . Pericarditis 1980   hx of  . 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 S/P colostomy ([place])    due to perf sigmoid colon in setting of CDiff colitis  . Seizures ([place])    "w/verapimil"  . Surgical wound infection    Pseudomonas; wound VAC; secondary intention  . 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 ABDOMINAL HYSTERECTOMY     partial hysterectomy  . APPENDECTOMY     as a child  . BOTOX INJECTION  12/03/2015   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  . CARDIAC CATHETERIZATION  11/21/10  . CATARACT EXTRACTION, BILATERAL    . CHOLECYSTECTOMY N/A 01/25/2016   Procedure: LAPAROSCOPIC CHOLECYSTECTOMY WITH INTRAOPERATIVE CHOLANGIOGRAM;  Surgeon: [name], MD;  Location: [facility];  Service: General;  Laterality: N/A;  . COLONOSCOPY    . COLOSTOMY RECONNECT    . colostomy reversal  06/27/11  . CORONARY ANGIOPLASTY WITH STENT PLACEMENT    . CORONARY ARTERY BYPASS GRAFT  11-25-2010   CABG X3; LIMA to LAD, SVG to PDA, SVG to OM, EVH via right thigh  . ESOPHAGOGASTRODUODENOSCOPY    . FLEXIBLE SIGMOIDOSCOPY  11/09/2011   Procedure: FLEXIBLE SIGMOIDOSCOPY;  Surgeon: Milus Banister, MD;  Location: [facility];  Service: Endoscopy;  Laterality: N/A;  . HEMORRHOID SURGERY  ~ 2005   x 2  . OOPHORECTOMY  2005   left; w/"mass removal"  . PROCTOSCOPY  06/27/2011   Procedure: PROCTOSCOPY;  Surgeon: Gayland Curry, MD;  Location: [facility];  Service: General;  Laterality: N/A;  . SIGMOIDECTOMY  12/18/2010   with end colostomy  . TUBAL LIGATION       Current Meds  Medication Sig  . amoxicillin (AMOXIL) 500 MG capsule Before dentist.  . ascorbic acid (VITAMIN C) 1000 MG tablet Take by mouth.  Marland Kitchen  aspirin 81 MG tablet Take 81 mg by mouth daily.  . Bacillus Coagulans-Inulin (PROBIOTIC-PREBIOTIC) 1-250 BILLION-MG CAPS Take 1 capsule by mouth daily.  . Calcium Carb-Cholecalciferol 600-800 MG-UNIT TABS Take 1 tablet by mouth daily.  . clopidogrel (PLAVIX) 75 MG tablet Take 75 mg by mouth daily.  . Evolocumab (REPATHA SURECLICK) 245 MG/ML SOAJ Inject 140 mg into the skin every 14 (fourteen) days.  Marland Kitchen ezetimibe (ZETIA) 10 MG tablet Take 1 tablet by mouth once daily  . losartan (COZAAR) 50 MG tablet Take 1 tablet by mouth once daily  . mesalamine (APRISO) 0.375 g 24 hr capsule Take 1 capsule (0.375 g total) by  mouth 2 (two) times daily.  . Multiple Vitamin (MULTI-VITAMIN) tablet Take by mouth.  . [DISCONTINUED] furosemide (LASIX) 20 MG tablet Take 1 tablet by mouth once daily  . [DISCONTINUED] potassium chloride SA (KLOR-CON) 20 MEQ tablet Take 1 tablet by mouth once daily     Allergies:   Apple, Nadolol, Other, Sulfa antibiotics, Sulfamethoxazole, Sulfonamide derivatives, Verapamil, Crestor [rosuvastatin], and Flagyl [metronidazole]   Social History   Tobacco Use  . Smoking status: Never Smoker  . Smokeless tobacco: Never Used  Vaping Use  . Vaping Use: Never used  Substance Use Topics  . Alcohol use: No  . Drug use: No     Family Hx: The patient's family history includes Coronary artery disease in her mother; Heart attack (age of onset: 30) in her father; Heart disease in her father and mother. There is no history of Anesthesia problems, Hypotension, Malignant hyperthermia, Pseudochol deficiency, Colon cancer, Esophageal cancer, Stomach cancer, or Rectal cancer.  ROS:   Please see the history of present illness.    All other systems reviewed and are negative.   Prior CV studies:   The following studies were reviewed today:  Echo 07/17/18: IMPRESSIONS    1. The left ventricle has normal systolic function with an ejection fraction of 60-65%. The cavity size was normal. Left  ventricular diastolic Doppler parameters are consistent with pseudonormalization Elevated left atrial and left ventricular  end-diastolic pressures.  2. The right ventricle has normal systolic function. The cavity was normal. There is no increase in right ventricular wall thickness. Right ventricular systolic pressure is moderately elevated with an estimated pressure of 43.7 mmHg.  3. The mitral valve is degenerative. Moderate thickening of the mitral valve leaflets with moderate involvement of the chordae tendinae. Mild calcification of the mitral valve leaflet.  4. The tricuspid valve is normal in structure.  5. The aortic valve is tricuspid Aortic valve regurgitation is mild by color flow Doppler.  6. The pulmonic valve was normal in structure.  7. The inferior vena cava was dilated in size with <50% respiratory variability.  8. The interatrial septum appears to be lipomatous.   Myoview 07/19/2018 Study Highlights     The left ventricular ejection fraction is hyperdynamic (>65%).  Nuclear stress EF: 67%.  No T wave inversion was noted during stress.  There was no ST segment deviation noted during stress.  The study is normal.  This is a low risk study.  Normal perfusion. LVEF 67% with normal wall motion. This is a low risk study. No change compared to prior study in 2018.     Labs/Other Tests and Data Reviewed:    EKG:  Today shows NSR with PACs. Right axis. Possible old septal infarct. I have personally reviewed and interpreted this study.    Recent Labs: No results found for requested labs within last 8760 hours.   Recent Lipid Panel Lab Results  Component Value Date/Time   CHOL 115 12/23/2019 08:50 AM   TRIG 94 12/23/2019 08:50 AM   HDL 57 12/23/2019 08:50 AM   CHOLHDL 2.0 12/23/2019 08:50 AM   CHOLHDL 3 04/13/2014 09:34 AM   LDLCALC 40 12/23/2019 08:50 AM   LDLDIRECT 81.8 07/20/2011 03:04 PM   Dated 07/02/18: cholesterol 161, triglycerides 81, HDL 52, LDL  93. CMET and TSH normal  Dated 12/24/19: BNP 300. Glucose 144, BUN 22, otherwise CMET normal. plts 132K. WBC 3.2K.   Wt Readings from Last 3 Encounters:  10/04/20 136 lb 3.2 oz (61.8 kg)  08/24/20  133 lb (60.3 kg)  05/24/20 135 lb (61.2 kg)     Objective:    Vital Signs:  BP 140/66 (BP Location: Right Arm, Patient Position: Sitting, Cuff Size: Normal)   Pulse (!) 59   Ht 5' 6.5" (1.689 m)   Wt 136 lb 3.2 oz (61.8 kg)   BMI 21.65 kg/m    GENERAL:  Well appearing WF in NAD HEENT:  PERRL, EOMI, sclera are clear. Oropharynx is clear. NECK:  No jugular venous distention, carotid upstroke brisk and symmetric, no bruits, no thyromegaly or adenopathy LUNGS:  Clear to auscultation bilaterally CHEST:  Unremarkable HEART:  RRR,  PMI not displaced or sustained,S1 and S2 within normal limits, no S3, no S4: no clicks, no rubs, no murmurs ABD:  Soft, nontender. BS +, no masses or bruits. No hepatomegaly, no splenomegaly EXT:  2 + pulses throughout, no edema, no cyanosis no clubbing. She has some bruising at her right groin site but no hematoma. SKIN:  Warm and dry.  No rashes NEURO:  Alert and oriented x 3. Cranial nerves II through XII intact. PSYCH:  Cognitively intact    ASSESSMENT & PLAN:    1.  CAD s/p CABG x 3 in 2012 for multivessel CAD. Normal Myoview in May 2020. Now with  episode of unstable angina in August 2021. S/p DES of the proximal LCx. Patent grafts.  Now on ASA and Plavix for one year. She is having some chest discomfort- not as bad as last summer and transient. Will arrange for a stress Myoview.  2. Hypercholesterolemia. intolerant of statins. On Zetia. Now on Repatha with excellent response LDL 40. Due for repeat labs- will update.   3. Carotid arterial disease. 60-79% RICA stenosis. No change in Carotid dopplers Jan 2022. Follow yearly.   4. HTN controlled.   5. Zoster neuralgia.   6. Palpitations. Noted PACs on Ecg. Will arrange for a Zio monitor to evaluate.    7. Chronic diastolic dysfunction. Will change lasix and potassium to prn swelling or increased weight.     Medication Adjustments/Labs and Tests Ordered: Current medicines are reviewed at length with the patient today.  Concerns regarding medicines are outlined above.   Tests Ordered: Orders Placed This Encounter  Procedures  . Basic metabolic panel  . Lipid panel  . Hepatic function panel  . CBC w/Diff/Platelet  . Cardiac Stress Test: Informed Consent Details: Physician/Practitioner Attestation; Transcribe to consent form and obtain patient signature  . Myocardial Perfusion Imaging  . EKG 12-Lead    Medication Changes: Meds ordered this encounter  Medications  . furosemide (LASIX) 20 MG tablet    Sig: Take 1 tablet (20 mg total) by mouth daily as needed for edema.    Dispense:  90 tablet    Refill:  3  . potassium chloride SA (KLOR-CON) 20 MEQ tablet    Sig: Take 1 tablet (20 mEq total) by mouth daily as needed (when taking lasix.).    Dispense:  90 tablet    Refill:  0    Follow Up:  6 months   Signed, Lenay Lovejoy Martinique, MD  10/04/2020 10:13 AM    [facility]  Addendum    Hallie Ishida Martinique MD, [facility]

## 2020-10-04 ENCOUNTER — Ambulatory Visit (INDEPENDENT_AMBULATORY_CARE_PROVIDER_SITE_OTHER): Payer: PPO

## 2020-10-04 ENCOUNTER — Encounter: Payer: Self-pay | Admitting: Cardiology

## 2020-10-04 ENCOUNTER — Other Ambulatory Visit: Payer: Self-pay

## 2020-10-04 ENCOUNTER — Ambulatory Visit: Payer: PPO | Admitting: Cardiology

## 2020-10-04 VITALS — BP 140/66 | HR 59 | Ht 66.5 in | Wt 136.2 lb

## 2020-10-04 DIAGNOSIS — I25708 Atherosclerosis of coronary artery bypass graft(s), unspecified, with other forms of angina pectoris: Secondary | ICD-10-CM | POA: Diagnosis not present

## 2020-10-04 DIAGNOSIS — R002 Palpitations: Secondary | ICD-10-CM

## 2020-10-04 DIAGNOSIS — I10 Essential (primary) hypertension: Secondary | ICD-10-CM

## 2020-10-04 DIAGNOSIS — E785 Hyperlipidemia, unspecified: Secondary | ICD-10-CM

## 2020-10-04 DIAGNOSIS — I6523 Occlusion and stenosis of bilateral carotid arteries: Secondary | ICD-10-CM

## 2020-10-04 LAB — BASIC METABOLIC PANEL
BUN/Creatinine Ratio: 18 (ref 12–28)
BUN: 16 mg/dL (ref 8–27)
CO2: 28 mmol/L (ref 20–29)
Calcium: 9.5 mg/dL (ref 8.7–10.3)
Chloride: 102 mmol/L (ref 96–106)
Creatinine, Ser: 0.91 mg/dL (ref 0.57–1.00)
Glucose: 100 mg/dL — ABNORMAL HIGH (ref 65–99)
Potassium: 4.9 mmol/L (ref 3.5–5.2)
Sodium: 141 mmol/L (ref 134–144)
eGFR: 65 mL/min/{1.73_m2} (ref >59)

## 2020-10-04 LAB — CBC WITH DIFFERENTIAL/PLATELET
Basophils Absolute: 0 10*3/uL (ref 0.0–0.2)
Basos: 1 %
EOS (ABSOLUTE): 0.1 10*3/uL (ref 0.0–0.4)
Eos: 3 %
Hematocrit: 42.4 % (ref 34.0–46.6)
Hemoglobin: 13.8 g/dL (ref 11.1–15.9)
Immature Grans (Abs): 0 10*3/uL (ref 0.0–0.1)
Immature Granulocytes: 0 %
Lymphocytes Absolute: 1 10*3/uL (ref 0.7–3.1)
Lymphs: 23 %
MCH: 28.5 pg (ref 26.6–33.0)
MCHC: 32.5 g/dL (ref 31.5–35.7)
MCV: 88 fL (ref 79–97)
Monocytes Absolute: 0.4 10*3/uL (ref 0.1–0.9)
Monocytes: 8 %
Neutrophils Absolute: 2.9 10*3/uL (ref 1.4–7.0)
Neutrophils: 65 %
Platelets: 162 10*3/uL (ref 150–450)
RBC: 4.84 x10E6/uL (ref 3.77–5.28)
RDW: 15.3 % (ref 11.7–15.4)
WBC: 4.5 10*3/uL (ref 3.4–10.8)

## 2020-10-04 LAB — HEPATIC FUNCTION PANEL
ALT: 15 IU/L (ref 0–32)
AST: 23 IU/L (ref 0–40)
Albumin: 4.2 g/dL (ref 3.7–4.7)
Alkaline Phosphatase: 71 IU/L (ref 44–121)
Bilirubin Total: 0.5 mg/dL (ref 0.0–1.2)
Bilirubin, Direct: 0.16 mg/dL (ref 0.00–0.40)
Total Protein: 6.9 g/dL (ref 6.0–8.5)

## 2020-10-04 LAB — LIPID PANEL
Chol/HDL Ratio: 2 ratio (ref 0.0–4.4)
Cholesterol, Total: 153 mg/dL (ref 100–199)
HDL: 78 mg/dL (ref >39)
LDL Chol Calc (NIH): 57 mg/dL (ref 0–99)
Triglycerides: 102 mg/dL (ref 0–149)
VLDL Cholesterol Cal: 18 mg/dL (ref 5–40)

## 2020-10-04 MED ORDER — FUROSEMIDE 20 MG PO TABS: 20.0000 mg | ORAL_TABLET | Freq: Every day | ORAL | 3 refills | Status: DC | PRN

## 2020-10-04 MED ORDER — POTASSIUM CHLORIDE CRYS ER 20 MEQ PO TBCR: 20.0000 meq | EXTENDED_RELEASE_TABLET | Freq: Every day | ORAL | 0 refills | Status: DC | PRN

## 2020-10-04 NOTE — Patient Instructions (Signed)
Medication Instructions:  Take Lasix and potassium daily as needed Continue all other medications *If you need a refill on your cardiac medications before your next appointment, please call your pharmacy*   Lab Work: Bmet,lipid and hepatic panels,cbc today   Testing/Procedures: Schedule  Stress Myoview  14 day Zio Heart Monitor    Follow-Up: At [facility], you and your health needs are our priority.  As part of our continuing mission to provide you with exceptional heart care, we have created designated Provider Care Teams.  These Care Teams include your primary Cardiologist (physician) and Advanced Practice Providers (APPs -  Physician Assistants and Nurse Practitioners) who all work together to provide you with the care you need, when you need it.  We recommend signing up for the patient portal called "MyChart".  Sign up information is provided on this After Visit Summary.  MyChart is used to connect with patients for Virtual Visits (Telemedicine).  Patients are able to view lab/test results, encounter notes, upcoming appointments, etc.  Non-urgent messages can be sent to your provider as well.   To learn more about what you can do with MyChart, go to [email or web address].    Your next appointment:  6 months   Call in August to schedule Nov appointment   The format for your next appointment: Office    Provider:  Dr.Jordan

## 2020-10-06 ENCOUNTER — Telehealth (HOSPITAL_COMMUNITY): Payer: Self-pay | Admitting: *Deleted

## 2020-10-06 NOTE — Telephone Encounter (Signed)
Close encounter 

## 2020-10-07 ENCOUNTER — Ambulatory Visit (HOSPITAL_COMMUNITY)
Admission: RE | Admit: 2020-10-07 | Discharge: 2020-10-07 | Disposition: A | Discharge status: Home / Self Care | Payer: PPO | Source: Ambulatory Visit | Attending: Cardiology | Admitting: Cardiology

## 2020-10-07 ENCOUNTER — Other Ambulatory Visit: Payer: Self-pay

## 2020-10-07 DIAGNOSIS — I10 Essential (primary) hypertension: Secondary | ICD-10-CM | POA: Diagnosis not present

## 2020-10-07 DIAGNOSIS — I25708 Atherosclerosis of coronary artery bypass graft(s), unspecified, with other forms of angina pectoris: Secondary | ICD-10-CM | POA: Insufficient documentation

## 2020-10-07 DIAGNOSIS — I6523 Occlusion and stenosis of bilateral carotid arteries: Secondary | ICD-10-CM | POA: Diagnosis not present

## 2020-10-07 DIAGNOSIS — E785 Hyperlipidemia, unspecified: Secondary | ICD-10-CM | POA: Insufficient documentation

## 2020-10-07 DIAGNOSIS — R002 Palpitations: Secondary | ICD-10-CM | POA: Diagnosis not present

## 2020-10-07 LAB — MYOCARDIAL PERFUSION IMAGING
Estimated workload: 7 METS
Exercise duration (min): 6 min
Exercise duration (sec): 0 s
LV dias vol: 81 mL (ref 46–106)
LV sys vol: 31 mL
MPHR: 142 {beats}/min
Peak HR: 148 {beats}/min
Percent HR: 104 %
Rest HR: 69 {beats}/min
SDS: 1
SRS: 0
SSS: 1
TID: 1.02

## 2020-10-07 MED ORDER — TECHNETIUM TC 99M TETROFOSMIN IV KIT
10.2000 | PACK | Freq: Once | INTRAVENOUS | Status: AC | PRN
  Administered 2020-10-07: 10.2 via INTRAVENOUS
  Filled 2020-10-07: qty 11

## 2020-10-07 MED ORDER — TECHNETIUM TC 99M TETROFOSMIN IV KIT
30.5000 | PACK | Freq: Once | INTRAVENOUS | Status: AC | PRN
  Administered 2020-10-07: 30.5 via INTRAVENOUS
  Filled 2020-10-07: qty 31

## 2020-10-08 DIAGNOSIS — I6523 Occlusion and stenosis of bilateral carotid arteries: Secondary | ICD-10-CM | POA: Diagnosis not present

## 2020-10-08 DIAGNOSIS — E785 Hyperlipidemia, unspecified: Secondary | ICD-10-CM

## 2020-10-08 DIAGNOSIS — I10 Essential (primary) hypertension: Secondary | ICD-10-CM

## 2020-10-08 DIAGNOSIS — I25708 Atherosclerosis of coronary artery bypass graft(s), unspecified, with other forms of angina pectoris: Secondary | ICD-10-CM

## 2020-10-08 DIAGNOSIS — R002 Palpitations: Secondary | ICD-10-CM | POA: Diagnosis not present

## 2020-10-12 ENCOUNTER — Other Ambulatory Visit: Payer: Self-pay | Admitting: Gastroenterology

## 2020-10-14 ENCOUNTER — Telehealth: Payer: Self-pay | Admitting: Gastroenterology

## 2020-10-14 DIAGNOSIS — Z6822 Body mass index (BMI) 22.0-22.9, adult: Secondary | ICD-10-CM | POA: Diagnosis not present

## 2020-10-14 DIAGNOSIS — B029 Zoster without complications: Secondary | ICD-10-CM | POA: Diagnosis not present

## 2020-10-14 DIAGNOSIS — R21 Rash and other nonspecific skin eruption: Secondary | ICD-10-CM | POA: Diagnosis not present

## 2020-10-14 NOTE — Telephone Encounter (Signed)
Inbound call from patient. States her PCP will not allow her to have shingles injection while taking Apriso unless Dr. Ardis Hughs approve. Can you please advise on this issue? Best contact [phone]

## 2020-10-14 NOTE — Telephone Encounter (Signed)
Dr Ardis Hughs please advise

## 2020-10-15 NOTE — Telephone Encounter (Signed)
The pt has been advised that she is not immunocompromised and can proceed with shingles injection. The pt has been advised of the information and verbalized understanding.

## 2020-10-15 NOTE — Telephone Encounter (Signed)
Apriso is not an immune suppressing medicine.  This patient is not immunocompromise.  I see no reason why she cannot have that vaccination.

## 2020-10-28 DIAGNOSIS — I25708 Atherosclerosis of coronary artery bypass graft(s), unspecified, with other forms of angina pectoris: Secondary | ICD-10-CM | POA: Diagnosis not present

## 2020-10-28 DIAGNOSIS — I10 Essential (primary) hypertension: Secondary | ICD-10-CM | POA: Diagnosis not present

## 2020-10-28 DIAGNOSIS — E785 Hyperlipidemia, unspecified: Secondary | ICD-10-CM | POA: Diagnosis not present

## 2020-10-28 DIAGNOSIS — I6523 Occlusion and stenosis of bilateral carotid arteries: Secondary | ICD-10-CM | POA: Diagnosis not present

## 2020-11-01 ENCOUNTER — Other Ambulatory Visit: Payer: Self-pay

## 2020-11-01 ENCOUNTER — Telehealth: Payer: Self-pay | Admitting: Cardiology

## 2020-11-01 MED ORDER — APIXABAN 5 MG PO TABS: 5.0000 mg | ORAL_TABLET | Freq: Two times a day (BID) | ORAL | 3 refills | Status: DC

## 2020-11-01 NOTE — Telephone Encounter (Signed)
Spoke with pt regarding concerns that she has with being on both eliquis and plavix. Pt states that when she picked up her new prescription for eliquis she was told by the pharmacist that they do not recommend for pt's to be on both medications at the same time because of the increased risk of bleeding. Per Dr. Doug Sou result note acknowledges that the pt is already on plavix and is choosing to discontinue aspirin. Dr. Martinique states that his intention is to stop plavix once the pt is one year out from stent placement. Pt instructed on signs on bleeding to watch for and is instructed if she has any issues to give [place] a call or seek emergency medical attention.  All questions answered and pt verbalizes understanding.

## 2020-11-01 NOTE — Telephone Encounter (Signed)
    Pt c/o medication issue:  1. Name of Medication: Eliquis  2. How are you currently taking this medication (dosage and times per day)?   3. Are you having a reaction (difficulty breathing--STAT)?   4. What is your medication issue? Pt said she spoke with her pharmacy was advised they cannot fill her new prescription for Eliquis because she is on  plavix

## 2020-11-06 ENCOUNTER — Other Ambulatory Visit: Payer: Self-pay | Admitting: Cardiology

## 2020-11-08 ENCOUNTER — Telehealth: Payer: Self-pay

## 2020-11-08 NOTE — Telephone Encounter (Signed)
Pt called back in and stated that they are going to continue to get the repatha medication until they come in to see dr Martinique

## 2020-11-08 NOTE — Telephone Encounter (Signed)
Pt called in and stated that even though she has the copay card for repatha it has increased to be more than the $5 that it had initially been being. I explained to the pt that repatha has changed its rules on its copay card and only covers the 1st 3 months at $5 then after that they only cover $200 so essentially... insurance pays their portion then repatha copay card will cover up to $200 and what is left over will be what the pt is financially responsible for. I advised the pt to think about it and let me know what they would like to do and I will await callback from the pt

## 2020-12-31 NOTE — Progress Notes (Signed)
Date:  01/05/2021   ID:  Annalysse, Shoemaker December 12, 1940, MRN [id number]   PCP:  Angelina Sheriff, MD  Cardiologist:  Lennyn Gange Martinique, MD  Electrophysiologist:  None   Evaluation Performed:  Follow-Up Visit  Chief Complaint:  CAD  History of Present Illness:    [place] JAHNE KRUKOWSKI is a 80 y.o. 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 Roxy Manns (LIMA-LAD, SVG-OM, SVG-PDA), postop atrial fibrillation, spastic dysphonia, carotid artery disease, hypertension and hyperlipidemia.  Cardiopulmonary stress test in 2017 showed no cardiac or ventilatory limitation.  ETT March 2018 showed hypertensive response.  Previous Myoview in April 2018 was low risk but no ischemia or scar.  She is intolerant of Crestor.  Patient was seen on 07/08/2018 at which time she complained of increasing dyspnea on exertion going up hills.  This is similar to her previous symptoms prior to CABG.  Echocardiogram obtained on 07/15/2018 revealed EF 60 to 65%, mild AI, moderately elevated RV pressure. Repeat Myoview was performed on 07/19/2018 which showed EF 67%, normal perfusion.  Her dyspnea on exertion was felt to be related to diastolic heart failure and she was placed on low-dose Lasix and potassium supplement.  Losartan was increased to 100 mg daily.  She was seen in our lipid clinic in June. Options for further lipid management were discussed. She was started on Repatha.   She presented to [facility] ED on December 24, 2019 with unstable angina.  Subsequent troponins were normal. Transferred to [facility] in [place] cardiac cath showing patent bypass grafts. This included a LIMA to the LAD, SVG to PDA and apparently SVG to diagonal (instead of OM as noted in op report).  She underwent PCI of the proximal LCx with a 2.75 x 12 mm Synergy stent. Echo was unremarkable except for moderate TR. She was started on Plavix.  I reviewed films from her cardiac cath in [facility]. She has patent LIMA to the LAD,  SVG to OM1, and SVG to PDA. There was a severe stenosis in the proximal LCx that supplies the second and third OMs distally. This stenosis was successfully stented. The mid to distal LCx has moderate diffuse disease.   On follow up she is still  having some intermittent fluttering. No real chest pain. Myoview was done and was normal. Event monitor showed some NSVT, nonsustained SVT and AFib with low burden. She was started on anticoagulation with Eliquis. She is now in the donut hole and states she has a hard time affording Repatha. Did have patient assistance before but this ran out.    Past Medical History:  Diagnosis Date   Anemia    "in my early teens"   Arthritis    neck and back   Atrial fibrillation ([place])    post-op after CABG;  treated with Amiodarone and coumadin until readmxn with CDiff colitis, perf colon and elevated LFTs (amio and coumadin stopped)   C. difficile colitis 10/2829   complicated post CABG course with prolonged admission, perforated sigmoid colon;  s/p sigmoid colectomy with end colostomy [name] procedure);  laparotomy wound infection with Pseudomonas treated with antibxs and secondary intention   Chronic kidney disease    hx kidney stones   Coronary artery disease    a. NSTEMI 7/12, cath: pLAD 30%, mLAD 60-70%, dLAD 80%, oOM2 50%, mOM2 40%, trifurcation 80%, pRCA 25%, then occluded, L-R collats, EF 55%;   b. 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  Roxy Manns) // c. ETT 3/18: inf-lat ST depression of 1 mm at 4 min of exercise, frequent ectopy and hypertensive BP response // d. 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   GERD (gastroesophageal reflux disease)    H/O hiatal hernia    "slight; not having problems with it"   Hair loss    after heart surgery   Hemorrhoids    "none since hemorrhoid OR"   History of bronchitis    late 1980's   Hyperlipidemia    takes Pravastatin daily   Hypertension     takes Metoprolol daily   Mitral valve prolapse    was told this in the late 1990's; ;then with Dr.Mclean he states that she doesn't   Myocardial infarction ([place]) 11/2010   Palpitations 2003   hx of; Holter in 2003 with PACs and PVCs   Pericarditis 1980   hx of   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   DR.OWEN   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 in setting of CDiff colitis   Seizures ([place])    "w/verapimil"   Surgical wound infection    Pseudomonas; wound VAC; secondary intention   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   Past Surgical History:  Procedure Laterality Date   ABDOMINAL HYSTERECTOMY     partial hysterectomy   APPENDECTOMY     as a child   BOTOX INJECTION  12/03/2015   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   CARDIAC CATHETERIZATION  11/21/10   CATARACT EXTRACTION, BILATERAL     CHOLECYSTECTOMY N/A 01/25/2016   Procedure: LAPAROSCOPIC CHOLECYSTECTOMY WITH INTRAOPERATIVE CHOLANGIOGRAM;  Surgeon: [name], MD;  Location: [facility];  Service: General;  Laterality: N/A;   COLONOSCOPY     COLOSTOMY RECONNECT     colostomy reversal  06/27/11   CORONARY ANGIOPLASTY WITH STENT PLACEMENT     CORONARY ARTERY BYPASS GRAFT  11-25-2010   CABG X3; LIMA to LAD, SVG to PDA, SVG to OM, EVH via right thigh   ESOPHAGOGASTRODUODENOSCOPY     FLEXIBLE SIGMOIDOSCOPY  11/09/2011   Procedure: FLEXIBLE SIGMOIDOSCOPY;  Surgeon: Milus Banister, MD;  Location: [facility];  Service: Endoscopy;  Laterality: N/A;   HEMORRHOID SURGERY  ~ 2005   x 2   OOPHORECTOMY  2005   left; w/"mass removal"   PROCTOSCOPY  06/27/2011   Procedure: PROCTOSCOPY;  Surgeon: Gayland Curry, MD;  Location: [place];  Service: General;  Laterality: N/A;   SIGMOIDECTOMY  12/18/2010   with end colostomy   TUBAL LIGATION       Current Meds  Medication Sig   amoxicillin (AMOXIL) 500 MG capsule Before dentist.   apixaban (ELIQUIS) 5 MG TABS tablet Take 1 tablet (5 mg total) by mouth 2 (two) times daily.    ascorbic acid (VITAMIN C) 1000 MG tablet Take by mouth.   Bacillus Coagulans-Inulin (PROBIOTIC-PREBIOTIC) 1-250 BILLION-MG CAPS Take 1 capsule by mouth daily.   Calcium Carb-Cholecalciferol 600-800 MG-UNIT TABS Take 1 tablet by mouth daily.   ezetimibe (ZETIA) 10 MG tablet Take 1 tablet by mouth once daily   losartan (COZAAR) 50 MG tablet Take 1 tablet by mouth once daily   mesalamine (APRISO) 0.375 g 24 hr capsule TAKE 1  BY MOUTH TWICE DAILY   Multiple Vitamin (MULTI-VITAMIN) tablet Take by mouth.   REPATHA SURECLICK 417 MG/ML SOAJ INJECT 140MG SUBCUTANEOUSLY EVERY 14 DAYS   [DISCONTINUED]  clopidogrel (PLAVIX) 75 MG tablet Take 75 mg by mouth daily.     Allergies:   Apple, Nadolol, Other, Sulfa antibiotics, Sulfamethoxazole, Sulfonamide derivatives, Verapamil, Crestor [rosuvastatin], and Flagyl [metronidazole]   Social History   Tobacco Use   Smoking status: Never   Smokeless tobacco: Never  Vaping Use   Vaping Use: Never used  Substance Use Topics   Alcohol use: No   Drug use: No     Family Hx: The patient's family history includes Coronary artery disease in her mother; Heart attack (age of onset: 26) in her father; Heart disease in her father and mother. There is no history of Anesthesia problems, Hypotension, Malignant hyperthermia, Pseudochol deficiency, Colon cancer, Esophageal cancer, Stomach cancer, or Rectal cancer.  ROS:   Please see the history of present illness.    All other systems reviewed and are negative.   Prior CV studies:   The following studies were reviewed today:  Echo 07/17/18: IMPRESSIONS      1. The left ventricle has normal systolic function with an ejection fraction of 60-65%. The cavity size was normal. Left ventricular diastolic Doppler parameters are consistent with pseudonormalization Elevated left atrial and left ventricular  end-diastolic pressures.  2. The right ventricle has normal systolic function. The cavity was normal. There is no  increase in right ventricular wall thickness. Right ventricular systolic pressure is moderately elevated with an estimated pressure of 43.7 mmHg.  3. The mitral valve is degenerative. Moderate thickening of the mitral valve leaflets with moderate involvement of the chordae tendinae. Mild calcification of the mitral valve leaflet.  4. The tricuspid valve is normal in structure.  5. The aortic valve is tricuspid Aortic valve regurgitation is mild by color flow Doppler.  6. The pulmonic valve was normal in structure.  7. The inferior vena cava was dilated in size with <50% respiratory variability.  8. The interatrial septum appears to be lipomatous.   Myoview 07/19/2018 Study Highlights      The left ventricular ejection fraction is hyperdynamic (>65%). Nuclear stress EF: 67%. No T wave inversion was noted during stress. There was no ST segment deviation noted during stress. The study is normal. This is a low risk study.   Normal perfusion. LVEF 67% with normal wall motion. This is a low risk study. No change compared to prior study in 2018.   Myoview 10/07/20: Study Highlights    The left ventricular ejection fraction is normal (55-65%). Nuclear stress EF: 62%. Blood pressure demonstrated a normal response to exercise. ST segment depression was noted during stress in the II, III, aVF, V5 and V6 leads. The study is normal. This is a low risk study.   Normal stress nuclear study with no ischemia or infarction.  Gated ejection fraction 62% with normal wall motion.  Patient did complain of atypical chest pain during the study and there was note of ST depression in the inferolateral leads.   Event monitor 10/28/20: Study Highlights    Normal sinus rhythm Rare PACs with runs of SVT/PAT. longest lasting 19 seconds Rare PVCs with infrequent NSVT 4-6 beats versus aberrancy Paroxysmal Afib with rate 73-164. mean 124 bpm. burden is low at < 1%. Minimal HR 31 at 7:29 am. Unclear if this is  post termination.  Labs/Other Tests and Data Reviewed:    EKG:  none today    Recent Labs: 10/04/2020: ALT 15; BUN 16; Creatinine, Ser 0.91; Hemoglobin 13.8; Platelets 162; Potassium 4.9; Sodium 141   Recent Lipid Panel Lab Results  Component Value Date/Time   CHOL 153 10/04/2020 10:45 AM   TRIG 102 10/04/2020 10:45 AM   HDL 78 10/04/2020 10:45 AM   CHOLHDL 2.0 10/04/2020 10:45 AM   CHOLHDL 3 04/13/2014 09:34 AM   LDLCALC 57 10/04/2020 10:45 AM   LDLDIRECT 81.8 07/20/2011 03:04 PM   Dated 07/02/18: cholesterol 161, triglycerides 81, HDL 52, LDL 93. CMET and TSH normal  Dated 12/24/19: BNP 300. Glucose 144, BUN 22, otherwise CMET normal. plts 132K. WBC 3.2K.   Wt Readings from Last 3 Encounters:  01/05/21 139 lb (63 kg)  10/07/20 136 lb (61.7 kg)  10/04/20 136 lb 3.2 oz (61.8 kg)     Objective:    Vital Signs:  BP 120/60 (BP Location: Left Arm, Patient Position: Sitting, Cuff Size: Normal)   Pulse 67   Ht 5' 6"  (1.676 m)   Wt 139 lb (63 kg)   BMI 22.44 kg/m    GENERAL:  Well appearing WF in NAD HEENT:  PERRL, EOMI, sclera are clear. Oropharynx is clear. NECK:  No jugular venous distention, carotid upstroke brisk and symmetric, no bruits, no thyromegaly or adenopathy LUNGS:  Clear to auscultation bilaterally CHEST:  Unremarkable HEART:  RRR,  PMI not displaced or sustained,S1 and S2 within normal limits, no S3, no S4: no clicks, no rubs, no murmurs ABD:  Soft, nontender. BS +, no masses or bruits. No hepatomegaly, no splenomegaly EXT:  2 + pulses throughout, no edema, no cyanosis no clubbing. She has some bruising at her right groin site but no hematoma. SKIN:  Warm and dry.  No rashes NEURO:  Alert and oriented x 3. Cranial nerves II through XII intact. PSYCH:  Cognitively intact    ASSESSMENT & PLAN:    1.  CAD s/p CABG x 3 in 2012 for multivessel CAD. Normal Myoview in May 2020. Had  unstable angina in August 2021. S/p DES of the proximal LCx. Patent grafts.  OK  to stop Plavix now that she is on Eliquis.  Repeat Myoview in May was normal.    2. Hypercholesterolemia. intolerant of statins. On Zetia. Now on Repatha with excellent response LDL 54. Difficulty affording medication since in the donut hole. No other real options at this point.    3. Carotid arterial disease. 60-79% RICA stenosis. No change in Carotid dopplers Jan 2022. Follow yearly.    4. HTN controlled.   5. Zoster neuralgia.   6. Palpitations. Noted PACs on Ecg. Event monitor showed some AFib and SVT. Now anticoagulated. No sustained arrhythmia. Not a candidate for beta blocker since average HR 59  7. Chronic diastolic dysfunction. On lasix and potassium prn swelling or increased weight.       Medication Adjustments/Labs and Tests Ordered: Current medicines are reviewed at length with the patient today.  Concerns regarding medicines are outlined above.   Tests Ordered: No orders of the defined types were placed in this encounter.   Medication Changes: No orders of the defined types were placed in this encounter.   Follow Up:  6 months   Signed, Jacere Pangborn Martinique, MD  01/05/2021 10:04 AM    [facility]  Addendum    Johndaniel Catlin Martinique MD, [facility]

## 2021-01-05 ENCOUNTER — Ambulatory Visit: Payer: PPO | Admitting: Cardiology

## 2021-01-05 ENCOUNTER — Encounter: Payer: Self-pay | Admitting: Cardiology

## 2021-01-05 ENCOUNTER — Other Ambulatory Visit: Payer: Self-pay

## 2021-01-05 VITALS — BP 120/60 | HR 67 | Ht 66.0 in | Wt 139.0 lb

## 2021-01-05 DIAGNOSIS — E785 Hyperlipidemia, unspecified: Secondary | ICD-10-CM | POA: Diagnosis not present

## 2021-01-05 DIAGNOSIS — I480 Paroxysmal atrial fibrillation: Secondary | ICD-10-CM

## 2021-01-05 DIAGNOSIS — I10 Essential (primary) hypertension: Secondary | ICD-10-CM | POA: Diagnosis not present

## 2021-01-05 DIAGNOSIS — I6523 Occlusion and stenosis of bilateral carotid arteries: Secondary | ICD-10-CM | POA: Diagnosis not present

## 2021-01-05 DIAGNOSIS — I25708 Atherosclerosis of coronary artery bypass graft(s), unspecified, with other forms of angina pectoris: Secondary | ICD-10-CM

## 2021-01-31 ENCOUNTER — Ambulatory Visit: Payer: PPO

## 2021-01-31 ENCOUNTER — Encounter (HOSPITAL_COMMUNITY): Payer: PPO

## 2021-01-31 DIAGNOSIS — E782 Mixed hyperlipidemia: Secondary | ICD-10-CM | POA: Diagnosis not present

## 2021-01-31 DIAGNOSIS — I774 Celiac artery compression syndrome: Secondary | ICD-10-CM | POA: Diagnosis not present

## 2021-01-31 DIAGNOSIS — I700 Atherosclerosis of aorta: Secondary | ICD-10-CM | POA: Diagnosis not present

## 2021-01-31 DIAGNOSIS — Z888 Allergy status to other drugs, medicaments and biological substances status: Secondary | ICD-10-CM | POA: Diagnosis not present

## 2021-01-31 DIAGNOSIS — Z882 Allergy status to sulfonamides status: Secondary | ICD-10-CM | POA: Diagnosis not present

## 2021-01-31 DIAGNOSIS — Z7901 Long term (current) use of anticoagulants: Secondary | ICD-10-CM | POA: Diagnosis not present

## 2021-01-31 DIAGNOSIS — Z951 Presence of aortocoronary bypass graft: Secondary | ICD-10-CM | POA: Diagnosis not present

## 2021-01-31 DIAGNOSIS — Z79899 Other long term (current) drug therapy: Secondary | ICD-10-CM | POA: Diagnosis not present

## 2021-01-31 DIAGNOSIS — I4892 Unspecified atrial flutter: Secondary | ICD-10-CM | POA: Diagnosis not present

## 2021-01-31 DIAGNOSIS — I4891 Unspecified atrial fibrillation: Secondary | ICD-10-CM | POA: Diagnosis not present

## 2021-01-31 DIAGNOSIS — I2510 Atherosclerotic heart disease of native coronary artery without angina pectoris: Secondary | ICD-10-CM | POA: Diagnosis not present

## 2021-01-31 DIAGNOSIS — K439 Ventral hernia without obstruction or gangrene: Secondary | ICD-10-CM | POA: Diagnosis not present

## 2021-01-31 DIAGNOSIS — I701 Atherosclerosis of renal artery: Secondary | ICD-10-CM | POA: Diagnosis not present

## 2021-01-31 DIAGNOSIS — I252 Old myocardial infarction: Secondary | ICD-10-CM | POA: Diagnosis not present

## 2021-01-31 DIAGNOSIS — I209 Angina pectoris, unspecified: Secondary | ICD-10-CM | POA: Diagnosis not present

## 2021-01-31 DIAGNOSIS — R001 Bradycardia, unspecified: Secondary | ICD-10-CM | POA: Diagnosis not present

## 2021-01-31 DIAGNOSIS — I10 Essential (primary) hypertension: Secondary | ICD-10-CM | POA: Diagnosis not present

## 2021-01-31 DIAGNOSIS — Z955 Presence of coronary angioplasty implant and graft: Secondary | ICD-10-CM | POA: Diagnosis not present

## 2021-02-01 DIAGNOSIS — I2510 Atherosclerotic heart disease of native coronary artery without angina pectoris: Secondary | ICD-10-CM

## 2021-02-01 DIAGNOSIS — E782 Mixed hyperlipidemia: Secondary | ICD-10-CM

## 2021-02-01 DIAGNOSIS — R001 Bradycardia, unspecified: Secondary | ICD-10-CM

## 2021-02-01 DIAGNOSIS — I4891 Unspecified atrial fibrillation: Secondary | ICD-10-CM

## 2021-02-02 ENCOUNTER — Telehealth: Payer: Self-pay | Admitting: Gastroenterology

## 2021-02-02 DIAGNOSIS — E782 Mixed hyperlipidemia: Secondary | ICD-10-CM | POA: Diagnosis not present

## 2021-02-02 DIAGNOSIS — I4891 Unspecified atrial fibrillation: Secondary | ICD-10-CM | POA: Diagnosis not present

## 2021-02-02 DIAGNOSIS — I2510 Atherosclerotic heart disease of native coronary artery without angina pectoris: Secondary | ICD-10-CM | POA: Diagnosis not present

## 2021-02-02 NOTE — Telephone Encounter (Signed)
Patient aware that we do not have samples of mesalamine.  Patient aware that mesalamine is generic. Patient will contact our office with any further questions or concerns.

## 2021-02-02 NOTE — Telephone Encounter (Signed)
Pt is requesting samples of Mesalamine. She stated that she is going on vacation next week and will not have enough medication for her trip and is too soon to get a rf. Pls call her.

## 2021-02-09 ENCOUNTER — Encounter (HOSPITAL_BASED_OUTPATIENT_CLINIC_OR_DEPARTMENT_OTHER): Payer: Self-pay | Admitting: Family

## 2021-02-09 ENCOUNTER — Ambulatory Visit (HOSPITAL_BASED_OUTPATIENT_CLINIC_OR_DEPARTMENT_OTHER): Payer: PPO | Admitting: Family

## 2021-02-09 ENCOUNTER — Other Ambulatory Visit: Payer: Self-pay

## 2021-02-09 VITALS — BP 130/60 | HR 60 | Ht 66.0 in | Wt 138.0 lb

## 2021-02-09 DIAGNOSIS — Z7901 Long term (current) use of anticoagulants: Secondary | ICD-10-CM | POA: Diagnosis not present

## 2021-02-09 DIAGNOSIS — I480 Paroxysmal atrial fibrillation: Secondary | ICD-10-CM

## 2021-02-09 DIAGNOSIS — I10 Essential (primary) hypertension: Secondary | ICD-10-CM | POA: Diagnosis not present

## 2021-02-09 DIAGNOSIS — G720 Drug-induced myopathy: Secondary | ICD-10-CM | POA: Diagnosis not present

## 2021-02-09 DIAGNOSIS — T466X5A Adverse effect of antihyperlipidemic and antiarteriosclerotic drugs, initial encounter: Secondary | ICD-10-CM

## 2021-02-09 DIAGNOSIS — Z79899 Other long term (current) drug therapy: Secondary | ICD-10-CM | POA: Diagnosis not present

## 2021-02-09 DIAGNOSIS — I25708 Atherosclerosis of coronary artery bypass graft(s), unspecified, with other forms of angina pectoris: Secondary | ICD-10-CM | POA: Diagnosis not present

## 2021-02-09 DIAGNOSIS — T466X5D Adverse effect of antihyperlipidemic and antiarteriosclerotic drugs, subsequent encounter: Secondary | ICD-10-CM

## 2021-02-09 DIAGNOSIS — I6523 Occlusion and stenosis of bilateral carotid arteries: Secondary | ICD-10-CM

## 2021-02-09 DIAGNOSIS — E785 Hyperlipidemia, unspecified: Secondary | ICD-10-CM | POA: Diagnosis not present

## 2021-02-09 MED ORDER — AMIODARONE HCL 100 MG PO TABS: 100.0000 mg | ORAL_TABLET | Freq: Every day | ORAL | 11 refills | Status: DC

## 2021-02-09 NOTE — Patient Instructions (Addendum)
Medication Instructions:  Your physician has recommended you make the following change in your medication:    START Muccinex (Guafenesin) for 3-5 days (there is a 12 hour tablet or 4 hour tablet) to help with cough. If it does not improve, please let us know.  CONTINUE Amiodarone 143m daily  *If you need a refill on your cardiac medications before your next appointment, please call your pharmacy*   Lab Work: Your physician recommends that you return for lab work in: 5 weeks (the first week of November) for CMP, TSH, CBC at [facility]   [place] [address] [place] Simply stop by 8:30AM - 4:30PM but avoid 12p-1p due to lunch hour. You do not need to be fasting and do not need an appointment.   If you have labs (blood work) drawn today and your tests are completely normal, you will receive your results only by: [place](if you have MyChart) OR A paper copy in the mail If you have any lab test that is abnormal or we need to change your treatment, we will call you to review the results.   Testing/Procedures: None ordered today.   Follow-Up: [facility] you and your health needs are our priority.  As part of our continuing mission to provide you with exceptional heart care, we have created designated Provider Care Teams.  These Care Teams include your primary Cardiologist (physician) and Advanced Practice Providers (APPs -  Physician Assistants and Nurse Practitioners) who all work together to provide you with the care you need, when you need it.  We recommend signing up for the patient portal called "MyChart".  Sign up information is provided on this After Visit Summary.  MyChart is used to connect with patients for Virtual Visits (Telemedicine).  Patients are able to view lab/test results, encounter notes, upcoming appointments, etc.  Non-urgent messages can be sent to your provider as well.   To learn more about what you can do with MyChart,  go to [email or web address]    Your next appointment:   3 month(s)  The format for your next appointment:   In Person  Provider:   You may see Peter JMartinique MD or one of the following Advanced Practice Providers on your designated Care Team:   HAlmyra Deforest PA-C AFabian Sharp PA-C or  KRoby Lofts PVermontCLoel Dubonnet NP   Other Instructions  Heart Healthy Diet Recommendations: A low-salt diet is recommended. Meats should be grilled, baked, or boiled. Avoid fried foods. Focus on lean protein sources like fish or chicken with vegetables and fruits. The American Heart Association is a GMicrobiologist   Exercise recommendations: The American Heart Association recommends 150 minutes of moderate intensity exercise weekly. Try 30 minutes of moderate intensity exercise 4-5 times per week. This could include walking, jogging, or swimming.    Atrial Fibrillation Atrial fibrillation is a type of heartbeat that is irregular or fast. If you have this condition, your heart beats without any order. This makes it hard for your heart to pump blood in a normal way. Atrial fibrillation may come and go, or it may become a long-lasting problem. If this condition is not treated, it can put you at higher risk for stroke, heart failure, and other heart problems. What are the causes? This condition may be caused by diseases that damage the heart. They include: High blood pressure. Heart failure. Heart valve disease. Heart surgery. Other causes include: Diabetes. Thyroid disease. Being overweight. Kidney disease.  Sometimes the cause is not known. What increases the risk? You are more likely to develop this condition if: You are older. You smoke. You exercise often and very hard. You have a family history of this condition. You are a man. You use drugs. You drink a lot of alcohol. You have lung conditions, such as emphysema, pneumonia, or COPD. You have sleep apnea. What are the signs or  symptoms? Common symptoms of this condition include: A feeling that your heart is beating very fast. Chest pain or discomfort. Feeling short of breath. Suddenly feeling light-headed or weak. Getting tired easily during activity. Fainting. Sweating. In some cases, there are no symptoms. How is this treated? Treatment for this condition depends on underlying conditions and how you feel when you have atrial fibrillation. They include: Medicines to: Prevent blood clots. Treat heart rate or heart rhythm problems. Using devices, such as a pacemaker, to correct heart rhythm problems. Doing surgery to remove the part of the heart that sends bad signals. Closing an area where clots can form in the heart (left atrial appendage). In some cases, your doctor will treat other underlying conditions. Follow these instructions at home: Medicines Take over-the-counter and prescription medicines only as told by your doctor. Do not take any new medicines without first talking to your doctor. If you are taking blood thinners: Talk with your doctor before you take any medicines that have aspirin or NSAIDs, such as ibuprofen, in them. Take your medicine exactly as told by your doctor. Take it at the same time each day. Avoid activities that could hurt or bruise you. Follow instructions about how to prevent falls. Wear a bracelet that says you are taking blood thinners. Or, carry a card that lists what medicines you take. Lifestyle   Do not use any products that have nicotine or tobacco in them. These include cigarettes, e-cigarettes, and chewing tobacco. If you need help quitting, ask your doctor. Eat heart-healthy foods. Talk with your doctor about the right eating plan for you. Exercise regularly as told by your doctor. Do not drink alcohol. Lose weight if you are overweight. Do not use drugs, including cannabis. General instructions If you have a condition that causes breathing to stop for a short  period of time (apnea), treat it as told by your doctor. Keep a healthy weight. Do not use diet pills unless your doctor says they are safe for you. Diet pills may make heart problems worse. Keep all follow-up visits as told by your doctor. This is important. Contact a doctor if: You notice a change in the speed, rhythm, or strength of your heartbeat. You are taking a blood-thinning medicine and you get more bruising. You get tired more easily when you move or exercise. You have a sudden change in weight. Get help right away if:  You have pain in your chest or your belly (abdomen). You have trouble breathing. You have side effects of blood thinners, such as blood in your vomit, poop (stool), or pee (urine), or bleeding that cannot stop. You have any signs of a stroke. "BE FAST" is an easy way to remember the main warning signs: B - Balance. Signs are dizziness, sudden trouble walking, or loss of balance. E - Eyes. Signs are trouble seeing or a change in how you see. F - Face. Signs are sudden weakness or loss of feeling in the face, or the face or eyelid drooping on one side. A - Arms. Signs are weakness or loss of feeling  in an arm. This happens suddenly and usually on one side of the body. S - Speech. Signs are sudden trouble speaking, slurred speech, or trouble understanding what people say. T - Time. Time to call emergency services. Write down what time symptoms started. You have other signs of a stroke, such as: A sudden, very bad headache with no known cause. Feeling like you may vomit (nausea). Vomiting. A seizure. These symptoms may be an emergency. Do not wait to see if the symptoms will go away. Get medical help right away. Call your local emergency services (911 in the [place] Do not drive yourself to the hospital. Summary Atrial fibrillation is a type of heartbeat that is irregular or fast. You are at higher risk of this condition if you smoke, are older, have diabetes, or are  overweight. Follow your doctor's instructions about medicines, diet, exercise, and follow-up visits. Get help right away if you have signs or symptoms of a stroke. Get help right away if you cannot catch your breath, or you have chest pain or discomfort. This information is not intended to replace advice given to you by your health care provider. Make sure you discuss any questions you have with your health care provider. Document Revised: 10/23/2018 Document Reviewed: 10/23/2018 Elsevier Patient Education  [place].

## 2021-02-09 NOTE — Progress Notes (Signed)
Office Visit    Patient Name: [name] Date of Encounter: 02/09/2021  PCP:  Angelina Sheriff, MD   [place]  Cardiologist:  Peter Martinique, MD  Advanced Practice Provider:  No care team member to display Electrophysiologist:  None      Chief Complaint    [name] is a 80 y.o. female with a hx of CAD s/p CABG 11/2010 (LIMA-LAD, SVG-OM, SVG-PDA), atrial fibrillation, spastic dysphonia, carotid artery disease, hypertension, hyperlipidemia presents today for follow-up after hospitalization in the hospital  Past Medical History    Past Medical History:  Diagnosis Date   Anemia    "in my early teens"   Arthritis    neck and back   Atrial fibrillation ([place])    post-op after CABG;  treated with Amiodarone and coumadin until readmxn with CDiff colitis, perf colon and elevated LFTs (amio and coumadin stopped)   C. difficile colitis 10/2829   complicated post CABG course with prolonged admission, perforated sigmoid colon;  s/p sigmoid colectomy with end colostomy [name] procedure);  laparotomy wound infection with Pseudomonas treated with antibxs and secondary intention   Chronic kidney disease    hx kidney stones   Coronary artery disease    a. NSTEMI 7/12, cath: pLAD 30%, mLAD 60-70%, dLAD 80%, oOM2 50%, mOM2 40%, trifurcation 80%, pRCA 25%, then occluded, L-R collats, EF 55%;   b. 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 Roxy Manns) // c. ETT 3/18: inf-lat ST depression of 1 mm at 4 min of exercise, frequent ectopy and hypertensive BP response // d. 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   GERD (gastroesophageal reflux disease)    H/O hiatal hernia    "slight; not having problems with it"   Hair loss    after heart surgery   Hemorrhoids    "none since hemorrhoid OR"   History of bronchitis    late 1980's   Hyperlipidemia    takes Pravastatin daily   Hypertension     takes Metoprolol daily   Mitral valve prolapse    was told this in the late 1990's; ;then with Dr.Mclean he states that she doesn't   Myocardial infarction ([place]) 11/2010   Palpitations 2003   hx of; Holter in 2003 with PACs and PVCs   Pericarditis 1980   hx of   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   DR.OWEN   S/P colostomy ([place])    due to perf sigmoid colon in setting of CDiff colitis   Seizures ([place])    "w/verapimil"   Surgical wound infection    Pseudomonas; wound VAC; secondary intention   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   Past Surgical History:  Procedure Laterality Date   ABDOMINAL HYSTERECTOMY     partial hysterectomy   APPENDECTOMY     as a child   BOTOX INJECTION  12/03/2015   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   CARDIAC CATHETERIZATION  11/21/10   CATARACT EXTRACTION, BILATERAL     CHOLECYSTECTOMY N/A 01/25/2016   Procedure: LAPAROSCOPIC CHOLECYSTECTOMY WITH INTRAOPERATIVE CHOLANGIOGRAM;  Surgeon: [name], MD;  Location: [facility];  Service: General;  Laterality: N/A;   COLONOSCOPY     COLOSTOMY RECONNECT     colostomy reversal  06/27/11   CORONARY ANGIOPLASTY WITH STENT PLACEMENT     CORONARY ARTERY BYPASS GRAFT  11-25-2010   CABG X3; LIMA  to LAD, SVG to PDA, SVG to OM, EVH via right thigh   ESOPHAGOGASTRODUODENOSCOPY     FLEXIBLE SIGMOIDOSCOPY  11/09/2011   Procedure: FLEXIBLE SIGMOIDOSCOPY;  Surgeon: Milus Banister, MD;  Location: [facility];  Service: Endoscopy;  Laterality: N/A;   HEMORRHOID SURGERY  ~ 2005   x 2   OOPHORECTOMY  2005   left; w/"mass removal"   PROCTOSCOPY  06/27/2011   Procedure: PROCTOSCOPY;  Surgeon: Gayland Curry, MD;  Location: [place];  Service: General;  Laterality: N/A;   SIGMOIDECTOMY  12/18/2010   with end colostomy   TUBAL LIGATION      Allergies  Allergies  Allergen Reactions   Apple Anaphylaxis   Nadolol Hives and Anaphylaxis    Rash and nausea   Other Other (See Comments) and Anaphylaxis    apples Apples-anaphylaxis    Sulfa Antibiotics Hives and Anaphylaxis   Sulfamethoxazole Anaphylaxis   Sulfonamide Derivatives Anaphylaxis   Verapamil Other (See Comments) and Anaphylaxis    seizures   Crestor [Rosuvastatin] Other (See Comments)    Myalgias    Flagyl [Metronidazole] Hives    History of Present Illness    [name] is a 80 y.o. female with a hx of CAD s/p CABG 11/2010 (LIMA-LAD, SVG-diagonal, SVG-PDA), atrial fibrillation, spastic dysphonia, carotid artery disease, hypertension, hyperlipidemia last seen 01/06/2019 by Dr. Martinique.  Cardiopulmonary stress test in 2017 with no cardiac or ventilatory limitation.  ETT March 2018 with hypertensive response.  Previous Myoview April 2018 low risk but no ischemia or scar.  She was intolerant of Crestor.  She was seen February 2020 complaining of increasing dyspnea on exertion which was similar to presentations prior CABG.  Echocardiogram 07/14/2020 LVEF 60 to 75%, mild AI, moderately elevated RV pressure.  Repeat Myoview with normal perfusion.  Dyspnea felt to be related to diastolic heart failure and placed on low-dose Lasix and potassium.  Her losartan was increased to 100 mg daily.  She was evaluated by the lipid clinic and started on [place].  Presented to [facility] ED 12/2019 with unstable angina.  Troponins were normal.  She was transferred to [facility] in [facility].  Underwent cardiac cath showing patent bypass grafts including LIMA-LAD, SVG-PDA and SVG-diagonal (instead of OM as noted in op report).  She underwent PCI of the proximal LCx with 2.75 X12 millimeter Synergy stent.  Echo unremarkable except for moderate TR.  Plavix was started.  The mid to distal LCx had moderate diffuse disease 1 reviewed by Dr. Martinique.  She was seen 01/05/21 with intermittent palpitations.  Event monitor showed some NSVT, nonsustained SVT, atrial fibrillation with adjustment anticoagulation with Eliquis.  She was hospitalized at [facility]. Found to be in  atrial flutter with RVR. Was started on Amiodarone drip after consultation with Dr. Geraldo Pitter and converted to NSR. She was subsequently transitioned to PO Amiodarone. CT chest angiography 01/31/21 normal heart size, evidence of CABG, no evidence of aortic dissection, few tree-in-bud opacities and centrilobular nodules in right upper lobe suggesting infectious/inflammatory bronchioloitis, marked celiac artery origin narrowing, ventral abdominal wall laxity with superimposed hernia containing nonobstructed small bowel loops. Her chest discomfort was though to be due to arrhythmia as troponin x3 unremarkable.   02/02/21 lab at [facility]: Wbc 4.3, rbc 4.55, Hb 12.9, Hct 39, Plt 139 Na 141, K 4.2, creatinine 1.00, GFR 54, creatinine 0.9, GFR>60 Total cholesterol 117, LDL 46.4, triglycerides 83,   She presents today for follow up. BP and heart rate have been well  controlled since hospital discharge. Only one episode of palpitations which quickly resolved. We reviewed Amiodarone usage and hospital records. Reports no shortness of breath nor dyspnea on exertion. Reports no chest pain, pressure, or tightness. No edema, orthopnea, PND. Reports no palpitations.  Reports some congestion and cough. We discussed that this would be uncommon reaction to Anlodipine.    EKGs/Labs/Other Studies Reviewed:   The following studies were reviewed today:  CT Angio/Chest/Pelvis NO evidence of aortic dissection or other acute vascular abnormality.   Few tree in bud opacities and centrilobular nodules in the right upper lobe suggesting infectious/inflammatory bronchiolitis  Marked celiac artery origin narrowing.   Ventral abdominal wall laxity with superimposed hernia containing nonobstructed small bowel loops.   Echo 07/17/18:   1. The left ventricle has normal systolic function with an ejection fraction of 60-65%. The cavity size was normal. Left ventricular diastolic Doppler parameters are consistent with  pseudonormalization Elevated left atrial and left ventricular  end-diastolic pressures.  2. The right ventricle has normal systolic function. The cavity was normal. There is no increase in right ventricular wall thickness. Right ventricular systolic pressure is moderately elevated with an estimated pressure of 43.7 mmHg.  3. The mitral valve is degenerative. Moderate thickening of the mitral valve leaflets with moderate involvement of the chordae tendinae. Mild calcification of the mitral valve leaflet.  4. The tricuspid valve is normal in structure.  5. The aortic valve is tricuspid Aortic valve regurgitation is mild by color flow Doppler.  6. The pulmonic valve was normal in structure.  7. The inferior vena cava was dilated in size with <50% respiratory variability.  8. The interatrial septum appears to be lipomatous.   Myoview 07/19/2018    The left ventricular ejection fraction is hyperdynamic (>65%). Nuclear stress EF: 67%. No T wave inversion was noted during stress. There was no ST segment deviation noted during stress. The study is normal. This is a low risk study.   Normal perfusion. LVEF 67% with normal wall motion. This is a low risk study. No change compared to prior study in 2018.    Myoview 10/07/20:  The left ventricular ejection fraction is normal (55-65%). Nuclear stress EF: 62%. Blood pressure demonstrated a normal response to exercise. ST segment depression was noted during stress in the II, III, aVF, V5 and V6 leads. The study is normal. This is a low risk study.   Normal stress nuclear study with no ischemia or infarction.  Gated ejection fraction 62% with normal wall motion.  Patient did complain of atypical chest pain during the study and there was note of ST depression in the inferolateral leads.   Event monitor 10/28/20: Normal sinus rhythm Rare PACs with runs of SVT/PAT. longest lasting 19 seconds Rare PVCs with infrequent NSVT 4-6 beats versus  aberrancy Paroxysmal Afib with rate 73-164. mean 124 bpm. burden is low at < 1%. Minimal HR 31 at 7:29 am. Unclear if this is post termination.    EKG:  No EKG today.  Recent Labs: 10/04/2020: ALT 15; BUN 16; Creatinine, Ser 0.91; Hemoglobin 13.8; Platelets 162; Potassium 4.9; Sodium 141  Recent Lipid Panel    Component Value Date/Time   CHOL 153 10/04/2020 1045   TRIG 102 10/04/2020 1045   HDL 78 10/04/2020 1045   CHOLHDL 2.0 10/04/2020 1045   CHOLHDL 3 04/13/2014 0934   VLDL 24.6 04/13/2014 0934   LDLCALC 57 10/04/2020 1045   LDLDIRECT 81.8 07/20/2011 1504    Risk Assessment/Calculations:   CHA2DS2-VASc Score =  5  This indicates a 7.2% annual risk of stroke. The patient's score is based upon: CHF History: 0 HTN History: 1 Diabetes History: 0 Stroke History: 0 Vascular Disease History: 1 Age Score: 2 Gender Score: 1   Home Medications   Current Meds  Medication Sig   amiodarone (PACERONE) 100 MG tablet Take 1 tablet (100 mg total) by mouth daily.   amoxicillin (AMOXIL) 500 MG capsule Before dentist.   apixaban (ELIQUIS) 5 MG TABS tablet Take 1 tablet (5 mg total) by mouth 2 (two) times daily.   ascorbic acid (VITAMIN C) 1000 MG tablet Take by mouth.   Bacillus Coagulans-Inulin (PROBIOTIC-PREBIOTIC) 1-250 BILLION-MG CAPS Take 1 capsule by mouth daily.   Calcium Carb-Cholecalciferol 600-800 MG-UNIT TABS Take 1 tablet by mouth daily.   ezetimibe (ZETIA) 10 MG tablet Take 1 tablet by mouth once daily   furosemide (LASIX) 20 MG tablet Take 1 tablet (20 mg total) by mouth daily as needed for edema.   losartan (COZAAR) 50 MG tablet Take 1 tablet by mouth once daily   mesalamine (APRISO) 0.375 g 24 hr capsule TAKE 1  BY MOUTH TWICE DAILY   Multiple Vitamin (MULTI-VITAMIN) tablet Take by mouth.   potassium chloride SA (KLOR-CON) 20 MEQ tablet Take 1 tablet (20 mEq total) by mouth daily as needed (when taking lasix.).   REPATHA SURECLICK 431 MG/ML SOAJ INJECT 140MG  SUBCUTANEOUSLY EVERY 14 DAYS     Review of Systems      All other systems reviewed and are otherwise negative except as noted above.  Physical Exam    VS:  BP 130/60 (BP Location: Left Arm, Patient Position: Sitting, Cuff Size: Normal)   Pulse 60   Ht 5' 6"  (1.676 m)   Wt 138 lb (62.6 kg)   SpO2 96%   BMI 22.27 kg/m  , BMI Body mass index is 22.27 kg/m.  Wt Readings from Last 3 Encounters:  02/09/21 138 lb (62.6 kg)  01/05/21 139 lb (63 kg)  10/07/20 136 lb (61.7 kg)     GEN: Well nourished, well developed, in no acute distress. HEENT: normal. Neck: Supple, no JVD, carotid bruits, or masses. Cardiac: RRR, no murmurs, rubs, or gallops. No clubbing, cyanosis, edema.  Radials/PT 2+ and equal bilaterally.  Respiratory:  Respirations regular and unlabored, clear to auscultation bilaterally. GI: Soft, nontender, nondistended. MS: No deformity or atrophy. Skin: Warm and dry, no rash. Neuro:  Strength and sensation are intact. Psych: Normal affect.  Assessment & Plan    CAD s/p 2012 CABG X3 and 12/2019 DES to prox LCx  - Recent admission with chest pain in setting of atrial flutter with RVR and troponin negative X3. No recurrent chest pain. No indication for ischemic evaluation. GDMT includes Repatha (statin in tolerance). No aspirin due to chronic anticoagulation. No BB due to bradycardia. Heart healthy diet and regular cardiovascular exercise encouraged.    HLD, LDL goal less than 70 / statin intolerance - Continue Repatha. Recent LDL at goal of <70.   Carotid artery disease -05/2020 60 to 79% R ICA stenosis. Plan for annual monitoring. Continue optimal lipid and BP control.   HTN - BP well controlled. Continue current antihypertensive regimen.    Palpitations /PAC/SVT /PAF /chronic anticoagulation / On Amiodarone therapy - Recent admission with atrial flutter with RVR discharged on Amiodarone 168m QD. AV nodal blocking therapy avoided due to baseline bradycardia. Only one brief  episode of palpitations since discharge. Normal sinus rhythm by auscultation today. Plan for TSH,  CMP, CBC in 5 weeks for monitoring on Amiodarone. No signs of Amiodarone toxicity. Discussed need for annual eye exams. Appropriately anticoagulated with Eliquis 65m BID, denies bleeding complications. Does not meet criteria for reduced dose.   Congested cough - Recommend Muccinex and follow up with primary care provider. Low suspicion Amidoarone contributory. Some abnormalities by CT during recent admission detailed above.   Chronic diastolic dysfunction - Euvolemic and well compensated on exam. Continue Lasix 243mQD.    Disposition: Follow up in 3 month(s) with Dr. JoMartiniquer APP.  Signed, CaLoel DubonnetNP 02/09/2021, 7:58 PM [facility]

## 2021-02-11 DIAGNOSIS — Z20828 Contact with and (suspected) exposure to other viral communicable diseases: Secondary | ICD-10-CM | POA: Diagnosis not present

## 2021-02-11 DIAGNOSIS — R059 Cough, unspecified: Secondary | ICD-10-CM | POA: Diagnosis not present

## 2021-02-11 DIAGNOSIS — R911 Solitary pulmonary nodule: Secondary | ICD-10-CM | POA: Diagnosis not present

## 2021-02-15 ENCOUNTER — Encounter (HOSPITAL_BASED_OUTPATIENT_CLINIC_OR_DEPARTMENT_OTHER): Payer: Self-pay

## 2021-02-28 ENCOUNTER — Ambulatory Visit: Payer: PPO | Admitting: Physician Assistant

## 2021-02-28 ENCOUNTER — Ambulatory Visit: Payer: PPO

## 2021-02-28 ENCOUNTER — Ambulatory Visit (HOSPITAL_COMMUNITY)
Admission: RE | Admit: 2021-02-28 | Discharge: 2021-02-28 | Disposition: A | Discharge status: Home / Self Care | Payer: PPO | Source: Ambulatory Visit | Attending: Physician Assistant | Admitting: Physician Assistant

## 2021-02-28 ENCOUNTER — Other Ambulatory Visit: Payer: Self-pay

## 2021-02-28 VITALS — BP 126/58 | HR 52 | Resp 14 | Ht 66.0 in | Wt 136.0 lb

## 2021-02-28 DIAGNOSIS — R0989 Other specified symptoms and signs involving the circulatory and respiratory systems: Secondary | ICD-10-CM

## 2021-02-28 DIAGNOSIS — I6523 Occlusion and stenosis of bilateral carotid arteries: Secondary | ICD-10-CM

## 2021-02-28 NOTE — Progress Notes (Signed)
Carotid Artery Follow-Up   VASCULAR SURGERY ASSESSMENT & PLAN:   [name] is a 80 y.o. female who presents for routine surveillance of right carotid artery stenosis. Bilateral carotid artery stenosis: The patient has no symptoms referable to carotid artery stenosis.  Duplex examination today is stable as compared to 6 months ago.  We reviewed the signs and symptoms of stroke/TIA and advised the patient to call EMS should these occur.    Palpable aortic pulse: will obtain screening aorta duplex and set up virtual visit for results.  Continue optimal medical management of hypertension and follow-up with primary care physician. Non-smoker. Continue the following medications: Zetia, Repatha.  She is maintained on Eliquis for atrial fibrillation.  Follow-up in 6 months with carotid duplex ultrasound.  SUBJECTIVE:   The patient denies monocular blindness, slurred speech, facial drooping, extremity weakness or numbness. 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   PHYSICAL EXAM:   Vitals:   02/28/21 1431  Resp: 14  Weight: 136 lb (61.7 kg)  Height: 5' 6"  (1.676 m)    General appearance: Well-developed, well-nourished in no apparent distress Neurologic: Alert and oriented x4, face symmetric, + dysphonia. No ataxia Cardiovascular: Heart rate regular, rhythm irregular.  Pedal pulses are palpable.  Right carotid bruit. Respirations: Nonlabored Abdomen: Palpable aortic pulse.   NON-INVASIVE VASCULAR STUDIES  02/28/2021 Summary:  Right Carotid: Velocities in the right ICA are consistent with a 60-79% stenosis. The ECA appears >50% stenosed.   Left Carotid: Velocities in the left ICA are consistent with a 1-39% stenosis.   Vertebrals:  Bilateral vertebral arteries demonstrate antegrade flow.   Subclavians: Normal flow hemodynamics were seen in bilateral subclavian arteries.   *See table(s) above for measurements and observations.     Preliminary      PROBLEM LIST:    The patient's past medical history, past surgical history, family history, social history, allergy list and medication list are reviewed.   CURRENT MEDS:    Current Outpatient Medications:    amiodarone (PACERONE) 100 MG tablet, Take 1 tablet (100 mg total) by mouth daily., Disp: 30 tablet, Rfl: 11   amoxicillin (AMOXIL) 500 MG capsule, Before dentist., Disp: , Rfl:    apixaban (ELIQUIS) 5 MG TABS tablet, Take 1 tablet (5 mg total) by mouth 2 (two) times daily., Disp: 180 tablet, Rfl: 3   ascorbic acid (VITAMIN C) 1000 MG tablet, Take by mouth., Disp: , Rfl:    Bacillus Coagulans-Inulin (PROBIOTIC-PREBIOTIC) 1-250 BILLION-MG CAPS, Take 1 capsule by mouth daily., Disp: , Rfl:    Calcium Carb-Cholecalciferol 600-800 MG-UNIT TABS, Take 1 tablet by mouth daily., Disp: , Rfl:    ezetimibe (ZETIA) 10 MG tablet, Take 1 tablet by mouth once daily, Disp: 90 tablet, Rfl: 3   losartan (COZAAR) 50 MG tablet, Take 1 tablet by mouth once daily, Disp: 90 tablet, Rfl: 1   mesalamine (APRISO) 0.375 g 24 hr capsule, TAKE 1  BY MOUTH TWICE DAILY, Disp: 60 capsule, Rfl: 11   Multiple Vitamin (MULTI-VITAMIN) tablet, Take by mouth., Disp: , Rfl:    REPATHA SURECLICK 330 MG/ML SOAJ, INJECT 140MG SUBCUTANEOUSLY EVERY 14 DAYS, Disp: 2 mL, Rfl: 11   furosemide (LASIX) 20 MG tablet, Take 1 tablet (20 mg total) by mouth daily as needed for edema. (Patient not taking: Reported on 02/28/2021), Disp: 90 tablet, Rfl: 3   potassium chloride SA (KLOR-CON) 20 MEQ tablet, Take 1 tablet (20 mEq total) by mouth daily as needed (when taking lasix.). (Patient  not taking: Reported on 02/28/2021), Disp: 90 tablet, Rfl: 0   REVIEW OF SYSTEMS:   [X]  denotes positive finding, [ ]  denotes negative finding Cardiac  Comments:  Chest pain or chest pressure:    Shortness of breath upon exertion:    Short of breath when lying flat:    Irregular heart rhythm:        Vascular    Pain in calf, thigh, or hip  brought on by ambulation:    Pain in feet at night that wakes you up from your sleep:     Blood clot in your veins:    Leg swelling:         Pulmonary    Oxygen at home:    Productive cough:     Wheezing:         Neurologic    Sudden weakness in arms or legs:     Sudden numbness in arms or legs:     Sudden onset of difficulty speaking or slurred speech:    Temporary loss of vision in one eye:     Problems with dizziness:         Gastrointestinal    Blood in stool:     Vomited blood:         Genitourinary    Burning when urinating:     Blood in urine:        Psychiatric    Major depression:         Hematologic    Bleeding problems:    Problems with blood clotting too easily:        Skin    Rashes or ulcers:        Constitutional    Fever or chills:     Dominique Banner, PA-C  Office: [phone] 02/28/2021 Dr. Virl Cagey

## 2021-03-04 ENCOUNTER — Other Ambulatory Visit: Payer: Self-pay

## 2021-03-04 DIAGNOSIS — I6523 Occlusion and stenosis of bilateral carotid arteries: Secondary | ICD-10-CM

## 2021-03-04 DIAGNOSIS — R0989 Other specified symptoms and signs involving the circulatory and respiratory systems: Secondary | ICD-10-CM

## 2021-03-07 DIAGNOSIS — Z23 Encounter for immunization: Secondary | ICD-10-CM | POA: Diagnosis not present

## 2021-03-16 DIAGNOSIS — Z79899 Other long term (current) drug therapy: Secondary | ICD-10-CM | POA: Diagnosis not present

## 2021-03-16 DIAGNOSIS — Z7901 Long term (current) use of anticoagulants: Secondary | ICD-10-CM | POA: Diagnosis not present

## 2021-03-16 DIAGNOSIS — I480 Paroxysmal atrial fibrillation: Secondary | ICD-10-CM | POA: Diagnosis not present

## 2021-03-16 LAB — CBC
Hematocrit: 39.1 % (ref 34.0–46.6)
Hemoglobin: 12.8 g/dL (ref 11.1–15.9)
MCH: 28.4 pg (ref 26.6–33.0)
MCHC: 32.7 g/dL (ref 31.5–35.7)
MCV: 87 fL (ref 79–97)
Platelets: 139 10*3/uL — ABNORMAL LOW (ref 150–450)
RBC: 4.5 x10E6/uL (ref 3.77–5.28)
RDW: 14.3 % (ref 11.7–15.4)
WBC: 3.6 10*3/uL (ref 3.4–10.8)

## 2021-03-16 LAB — COMPREHENSIVE METABOLIC PANEL
ALT: 12 IU/L (ref 0–32)
AST: 18 IU/L (ref 0–40)
Albumin/Globulin Ratio: 1.9 (ref 1.2–2.2)
Albumin: 4.2 g/dL (ref 3.7–4.7)
Alkaline Phosphatase: 69 IU/L (ref 44–121)
BUN/Creatinine Ratio: 18 (ref 12–28)
BUN: 18 mg/dL (ref 8–27)
Bilirubin Total: 0.5 mg/dL (ref 0.0–1.2)
CO2: 26 mmol/L (ref 20–29)
Calcium: 9.3 mg/dL (ref 8.7–10.3)
Chloride: 101 mmol/L (ref 96–106)
Creatinine, Ser: 0.99 mg/dL (ref 0.57–1.00)
Globulin, Total: 2.2 g/dL (ref 1.5–4.5)
Glucose: 84 mg/dL (ref 70–99)
Potassium: 4.6 mmol/L (ref 3.5–5.2)
Sodium: 139 mmol/L (ref 134–144)
Total Protein: 6.4 g/dL (ref 6.0–8.5)
eGFR: 58 mL/min/{1.73_m2} — ABNORMAL LOW (ref >59)

## 2021-03-16 LAB — TSH: TSH: 2.73 u[IU]/mL (ref 0.450–4.500)

## 2021-03-28 ENCOUNTER — Other Ambulatory Visit: Payer: Self-pay | Admitting: Cardiology

## 2021-03-31 ENCOUNTER — Ambulatory Visit: Payer: PPO

## 2021-03-31 ENCOUNTER — Encounter (HOSPITAL_COMMUNITY): Payer: PPO

## 2021-04-11 DIAGNOSIS — D691 Qualitative platelet defects: Secondary | ICD-10-CM | POA: Diagnosis not present

## 2021-04-25 ENCOUNTER — Telehealth: Payer: Self-pay | Admitting: Cardiology

## 2021-04-25 DIAGNOSIS — Z79899 Other long term (current) drug therapy: Secondary | ICD-10-CM

## 2021-04-25 DIAGNOSIS — Z7901 Long term (current) use of anticoagulants: Secondary | ICD-10-CM

## 2021-04-25 DIAGNOSIS — I480 Paroxysmal atrial fibrillation: Secondary | ICD-10-CM

## 2021-04-25 MED ORDER — AMIODARONE HCL 100 MG PO TABS: 100.0000 mg | ORAL_TABLET | Freq: Every day | ORAL | 3 refills | Status: DC

## 2021-04-25 NOTE — Telephone Encounter (Signed)
90-day prescription for Amiodarone with 3 refills sent 04/25/21.

## 2021-04-25 NOTE — Telephone Encounter (Signed)
*  STAT* If patient is at the pharmacy, call can be transferred to refill team.   1. Which medications need to be refilled? (please list name of each medication and dose if known) New prescription for Amiodarone  2. Which pharmacy/location (including street and city if local pharmacy) is medication to be sent to?[place], [place]  3. Do they need a 30 day or 90 day supply? 90 days and refills

## 2021-05-13 DIAGNOSIS — E7800 Pure hypercholesterolemia, unspecified: Secondary | ICD-10-CM | POA: Diagnosis not present

## 2021-05-13 DIAGNOSIS — I10 Essential (primary) hypertension: Secondary | ICD-10-CM | POA: Diagnosis not present

## 2021-05-13 DIAGNOSIS — I259 Chronic ischemic heart disease, unspecified: Secondary | ICD-10-CM | POA: Diagnosis not present

## 2021-05-17 ENCOUNTER — Encounter (HOSPITAL_BASED_OUTPATIENT_CLINIC_OR_DEPARTMENT_OTHER): Payer: Self-pay | Admitting: Family

## 2021-05-17 ENCOUNTER — Other Ambulatory Visit: Payer: Self-pay

## 2021-05-17 ENCOUNTER — Ambulatory Visit (INDEPENDENT_AMBULATORY_CARE_PROVIDER_SITE_OTHER): Payer: PPO | Admitting: Family

## 2021-05-17 VITALS — BP 150/62 | HR 43 | Ht 66.0 in | Wt 138.2 lb

## 2021-05-17 DIAGNOSIS — E785 Hyperlipidemia, unspecified: Secondary | ICD-10-CM

## 2021-05-17 DIAGNOSIS — I10 Essential (primary) hypertension: Secondary | ICD-10-CM | POA: Diagnosis not present

## 2021-05-17 DIAGNOSIS — D6859 Other primary thrombophilia: Secondary | ICD-10-CM | POA: Diagnosis not present

## 2021-05-17 DIAGNOSIS — I25118 Atherosclerotic heart disease of native coronary artery with other forms of angina pectoris: Secondary | ICD-10-CM | POA: Diagnosis not present

## 2021-05-17 DIAGNOSIS — I5032 Chronic diastolic (congestive) heart failure: Secondary | ICD-10-CM | POA: Diagnosis not present

## 2021-05-17 DIAGNOSIS — I480 Paroxysmal atrial fibrillation: Secondary | ICD-10-CM | POA: Diagnosis not present

## 2021-05-17 MED ORDER — AMIODARONE HCL 100 MG PO TABS: 50.0000 mg | ORAL_TABLET | Freq: Every day | ORAL | 3 refills | Status: DC

## 2021-05-17 NOTE — Patient Instructions (Addendum)
Medication Instructions:  Your physician has recommended you make the following change in your medication:   REDUCE Amiodarone to 51m (half tablet) once daily  *If you need a refill on your cardiac medications before your next appointment, please call your pharmacy*   Lab Work: None ordered today.  Testing/Procedures: Your EKG today showed sinus bradycardia with a rate of 43 bpm.   Follow-Up: [facility] you and your health needs are our priority.  As part of our continuing mission to provide you with exceptional heart care, we have created designated Provider Care Teams.  These Care Teams include your primary Cardiologist (physician) and Advanced Practice Providers (APPs -  Physician Assistants and Nurse Practitioners) who all work together to provide you with the care you need, when you need it.  We recommend signing up for the patient portal called "MyChart".  Sign up information is provided on this After Visit Summary.  MyChart is used to connect with patients for Virtual Visits (Telemedicine).  Patients are able to view lab/test results, encounter notes, upcoming appointments, etc.  Non-urgent messages can be sent to your provider as well.   To learn more about what you can do with MyChart, go to [email or web address]    Your next appointment:   You have been referred to Dr. TLovena Leof the Electrophysiology team. They will contact you to make an appointment  AND   4 month(s) with Dr. JMartiniqueor APP  Other Instructions  To prevent palpitations: Make sure you are adequately hydrated.  Avoid and/or limit caffeine containing beverages like soda or tea. Exercise regularly.  Manage stress well. Some over the counter medications can cause palpitations such as Benadryl, AdvilPM, TylenolPM. Regular Advil or Tylenol do not cause palpitations.

## 2021-05-17 NOTE — Progress Notes (Signed)
Office Visit    Patient Name: [name] Date of Encounter: 05/17/2021  PCP:  Angelina Sheriff, MD   [place]  Cardiologist:  Peter Martinique, MD  Advanced Practice Provider:  No care team member to display Electrophysiologist:  None      Chief Complaint    [name] is a 81 y.o. female with a hx of CAD s/p CABG 11/2010 (LIMA-LAD, SVG-OM, SVG-PDA), atrial fibrillation, spastic dysphonia, carotid artery disease, hypertension, hyperlipidemia presents today for follow-up   Past Medical History    Past Medical History:  Diagnosis Date   Anemia    "in my early teens"   Arthritis    neck and back   Atrial fibrillation ([place])    post-op after CABG;  treated with Amiodarone and coumadin until readmxn with CDiff colitis, perf colon and elevated LFTs (amio and coumadin stopped)   C. difficile colitis 12/6576   complicated post CABG course with prolonged admission, perforated sigmoid colon;  s/p sigmoid colectomy with end colostomy [name] procedure);  laparotomy wound infection with Pseudomonas treated with antibxs and secondary intention   Chronic kidney disease    hx kidney stones   Coronary artery disease    a. NSTEMI 7/12, cath: pLAD 30%, mLAD 60-70%, dLAD 80%, oOM2 50%, mOM2 40%, trifurcation 80%, pRCA 25%, then occluded, L-R collats, EF 55%;   b. 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 Roxy Manns) // c. ETT 3/18: inf-lat ST depression of 1 mm at 4 min of exercise, frequent ectopy and hypertensive BP response // d. 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   GERD (gastroesophageal reflux disease)    H/O hiatal hernia    "slight; not having problems with it"   Hair loss    after heart surgery   Hemorrhoids    "none since hemorrhoid OR"   History of bronchitis    late 1980's   Hyperlipidemia    takes Pravastatin daily   Hypertension    takes Metoprolol daily   Mitral  valve prolapse    was told this in the late 1990's; ;then with Dr.Mclean he states that she doesn't   Myocardial infarction ([place]) 11/2010   Palpitations 2003   hx of; Holter in 2003 with PACs and PVCs   Pericarditis 1980   hx of   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   DR.OWEN   S/P colostomy ([place])    due to perf sigmoid colon in setting of CDiff colitis   Seizures ([place])    "w/verapimil"   Surgical wound infection    Pseudomonas; wound VAC; secondary intention   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   Past Surgical History:  Procedure Laterality Date   ABDOMINAL HYSTERECTOMY     partial hysterectomy   APPENDECTOMY     as a child   BOTOX INJECTION  12/03/2015   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   CARDIAC CATHETERIZATION  11/21/10   CATARACT EXTRACTION, BILATERAL     CHOLECYSTECTOMY N/A 01/25/2016   Procedure: LAPAROSCOPIC CHOLECYSTECTOMY WITH INTRAOPERATIVE CHOLANGIOGRAM;  Surgeon: [name], MD;  Location: [facility];  Service: General;  Laterality: N/A;   COLONOSCOPY     COLOSTOMY RECONNECT     colostomy reversal  06/27/11   CORONARY ANGIOPLASTY WITH STENT PLACEMENT     CORONARY ARTERY BYPASS GRAFT  11-25-2010   CABG X3; LIMA to LAD, SVG to  PDA, SVG to OM, EVH via right thigh   ESOPHAGOGASTRODUODENOSCOPY     FLEXIBLE SIGMOIDOSCOPY  11/09/2011   Procedure: FLEXIBLE SIGMOIDOSCOPY;  Surgeon: Milus Banister, MD;  Location: [facility];  Service: Endoscopy;  Laterality: N/A;   HEMORRHOID SURGERY  ~ 2005   x 2   OOPHORECTOMY  2005   left; w/"mass removal"   PROCTOSCOPY  06/27/2011   Procedure: PROCTOSCOPY;  Surgeon: Gayland Curry, MD;  Location: [place];  Service: General;  Laterality: N/A;   SIGMOIDECTOMY  12/18/2010   with end colostomy   TUBAL LIGATION      Allergies  Allergies  Allergen Reactions   Apple Anaphylaxis   Nadolol Hives and Anaphylaxis    Rash and nausea   Other Other (See Comments) and Anaphylaxis    apples Apples-anaphylaxis   Sulfa Antibiotics Hives and  Anaphylaxis   Sulfamethoxazole Anaphylaxis   Sulfonamide Derivatives Anaphylaxis   Verapamil Other (See Comments) and Anaphylaxis    seizures   Crestor [Rosuvastatin] Other (See Comments)    Myalgias    Flagyl [Metronidazole] Hives    History of Present Illness    [name] is a 81 y.o. female with a hx of CAD s/p CABG 11/2010 (LIMA-LAD, SVG-diagonal, SVG-PDA), atrial fibrillation, spastic dysphonia, carotid artery disease, hypertension, hyperlipidemia last seen 02/09/2021  Cardiopulmonary stress test in 2017 with no cardiac or ventilatory limitation.  ETT March 2018 with hypertensive response.  Previous Myoview April 2018 low risk but no ischemia or scar.  She was intolerant of Crestor.  She was seen February 2020 complaining of increasing dyspnea on exertion which was similar to presentations prior CABG.  Echocardiogram 07/14/2020 LVEF 60 to 75%, mild AI, moderately elevated RV pressure.  Repeat Myoview with normal perfusion.  Dyspnea felt to be related to diastolic heart failure and placed on low-dose Lasix and potassium.  Her losartan was increased to 100 mg daily.  She was evaluated by the lipid clinic and started on [place].  Presented to [facility] ED 12/2019 with unstable angina.  Troponins were normal.  She was transferred to [facility] in [facility].  Underwent cardiac cath showing patent bypass grafts including LIMA-LAD, SVG-PDA and SVG-diagonal (instead of OM as noted in op report).  She underwent PCI of the proximal LCx with 2.75 X12 millimeter Synergy stent.  Echo unremarkable except for moderate TR.  Plavix was started.  The mid to distal LCx had moderate diffuse disease 1 reviewed by Dr. Martinique.  She was seen 01/05/21 with intermittent palpitations.  Event monitor showed some NSVT, nonsustained SVT, atrial fibrillation with adjustment anticoagulation with Eliquis.  Hospitalized 01/2021 with atrial flutter with RVR at [facility].  She was started on amiodarone  drip and converted to NSR.  She was then transitioned to p.o. amiodarone. CT chest angiography 01/31/21 normal heart size, evidence of CABG, no evidence of aortic dissection, few tree-in-bud opacities and centrilobular nodules in right upper lobe suggesting infectious/inflammatory bronchioloitis, marked celiac artery origin narrowing, ventral abdominal wall laxity with superimposed hernia containing nonobstructed small bowel loops. Her chest discomfort was though to be due to arrhythmia as troponin x3 unremarkable.   She was seen in follow-up 02/01/2021.  Follow-up labs amiodarone monitoring including TSH, c-Met, CBC were unremarkable. She presents today for follow up.  She also thinks that she had a good Christmas spent with her children and grandchildren.Reports no shortness of breath at rest but notes mild dyspnea with stairs which is overall stable at her baseline.  Reports  no chest pain, pressure, or tightness. No edema, orthopnea, PND. Reports rare fleeting palpitations which self resolved after a few seconds.  Blood pressure at home 120s to 130s.  Notes heart rate at home persistently in the 40s.  She endorses feeling fatigued.  She denies lightheadedness, dizziness, near syncope, syncope.  EKGs/Labs/Other Studies Reviewed:   The following studies were reviewed today:  CT Angio/Chest/Pelvis NO evidence of aortic dissection or other acute vascular abnormality.   Few tree in bud opacities and centrilobular nodules in the right upper lobe suggesting infectious/inflammatory bronchiolitis  Marked celiac artery origin narrowing.   Ventral abdominal wall laxity with superimposed hernia containing nonobstructed small bowel loops.   Echo 07/17/18:   1. The left ventricle has normal systolic function with an ejection fraction of 60-65%. The cavity size was normal. Left ventricular diastolic Doppler parameters are consistent with pseudonormalization Elevated left atrial and left ventricular  end-diastolic  pressures.  2. The right ventricle has normal systolic function. The cavity was normal. There is no increase in right ventricular wall thickness. Right ventricular systolic pressure is moderately elevated with an estimated pressure of 43.7 mmHg.  3. The mitral valve is degenerative. Moderate thickening of the mitral valve leaflets with moderate involvement of the chordae tendinae. Mild calcification of the mitral valve leaflet.  4. The tricuspid valve is normal in structure.  5. The aortic valve is tricuspid Aortic valve regurgitation is mild by color flow Doppler.  6. The pulmonic valve was normal in structure.  7. The inferior vena cava was dilated in size with <50% respiratory variability.  8. The interatrial septum appears to be lipomatous.   Myoview 07/19/2018    The left ventricular ejection fraction is hyperdynamic (>65%). Nuclear stress EF: 67%. No T wave inversion was noted during stress. There was no ST segment deviation noted during stress. The study is normal. This is a low risk study.   Normal perfusion. LVEF 67% with normal wall motion. This is a low risk study. No change compared to prior study in 2018.    Myoview 10/07/20:  The left ventricular ejection fraction is normal (55-65%). Nuclear stress EF: 62%. Blood pressure demonstrated a normal response to exercise. ST segment depression was noted during stress in the II, III, aVF, V5 and V6 leads. The study is normal. This is a low risk study.   Normal stress nuclear study with no ischemia or infarction.  Gated ejection fraction 62% with normal wall motion.  Patient did complain of atypical chest pain during the study and there was note of ST depression in the inferolateral leads.   Event monitor 10/28/20: Normal sinus rhythm Rare PACs with runs of SVT/PAT. longest lasting 19 seconds Rare PVCs with infrequent NSVT 4-6 beats versus aberrancy Paroxysmal Afib with rate 73-164. mean 124 bpm. burden is low at < 1%. Minimal HR  31 at 7:29 am. Unclear if this is post termination.    EKG:  EKG ordered today. EKG performed today demonstrates SB 43 bpm with right axis deviation and no acute ST/T wave changes.   Recent Labs: 03/16/2021: ALT 12; BUN 18; Creatinine, Ser 0.99; Hemoglobin 12.8; Platelets 139; Potassium 4.6; Sodium 139; TSH 2.730  Recent Lipid Panel    Component Value Date/Time   CHOL 153 10/04/2020 1045   TRIG 102 10/04/2020 1045   HDL 78 10/04/2020 1045   CHOLHDL 2.0 10/04/2020 1045   CHOLHDL 3 04/13/2014 0934   VLDL 24.6 04/13/2014 0934   LDLCALC 57 10/04/2020 1045   LDLDIRECT  81.8 07/20/2011 1504    Risk Assessment/Calculations:   CHA2DS2-VASc Score = 5  This indicates a 7.2% annual risk of stroke. The patient's score is based upon: CHF History: 0 HTN History: 1 Diabetes History: 0 Stroke History: 0 Vascular Disease History: 1 Age Score: 2 Gender Score: 1    Home Medications   Current Meds  Medication Sig   amiodarone (PACERONE) 100 MG tablet Take 0.5 tablets (50 mg total) by mouth daily.   amoxicillin (AMOXIL) 500 MG capsule Before dentist.   apixaban (ELIQUIS) 5 MG TABS tablet Take 1 tablet (5 mg total) by mouth 2 (two) times daily.   ascorbic acid (VITAMIN C) 1000 MG tablet Take by mouth.   Bacillus Coagulans-Inulin (PROBIOTIC-PREBIOTIC) 1-250 BILLION-MG CAPS Take 1 capsule by mouth daily.   Calcium Carb-Cholecalciferol 600-800 MG-UNIT TABS Take 1 tablet by mouth daily.   ezetimibe (ZETIA) 10 MG tablet Take 1 tablet by mouth once daily   furosemide (LASIX) 20 MG tablet Take 1 tablet (20 mg total) by mouth daily as needed for edema.   losartan (COZAAR) 50 MG tablet Take 1 tablet by mouth once daily   mesalamine (APRISO) 0.375 g 24 hr capsule TAKE 1  BY MOUTH TWICE DAILY   Multiple Vitamin (MULTI-VITAMIN) tablet Take by mouth.   potassium chloride SA (KLOR-CON) 20 MEQ tablet Take 1 tablet (20 mEq total) by mouth daily as needed (when taking lasix.).   REPATHA SURECLICK 580 MG/ML  SOAJ INJECT 140MG SUBCUTANEOUSLY EVERY 14 DAYS   [DISCONTINUED] amiodarone (PACERONE) 100 MG tablet Take 1 tablet (100 mg total) by mouth daily.     Review of Systems      All other systems reviewed and are otherwise negative except as noted above.  Physical Exam    VS:  BP (!) 150/62    Pulse (!) 43    Ht _0  (1.676 m)    Wt 138 lb 3.2 oz (62.7 kg)    BMI 22.31 kg/m  , BMI Body mass index is 22.31 kg/m.  Wt Readings from Last 3 Encounters:  05/17/21 138 lb 3.2 oz (62.7 kg)  02/28/21 136 lb (61.7 kg)  02/09/21 138 lb (62.6 kg)     GEN: Well nourished, well developed, in no acute distress. HEENT: normal. Neck: Supple, no JVD, carotid bruits, or masses. Cardiac: RRR, bradycardic, no murmurs, rubs, or gallops. No clubbing, cyanosis, edema.  Radials/PT 2+ and equal bilaterally.  Respiratory:  Respirations regular and unlabored, clear to auscultation bilaterally. GI: Soft, nontender, nondistended. MS: No deformity or atrophy. Skin: Warm and dry, no rash. Neuro:  Strength and sensation are intact. Psych: Normal affect.  Assessment & Plan    CAD s/p 2012 CABG X3 and 12/2019 DES to prox LCx  - Stable with no anginal symptoms. No indication for ischemic evaluation.   GDMT includes Repatha (statin in tolerance). No aspirin due to chronic anticoagulation. No BB due to bradycardia. Heart healthy diet and regular cardiovascular exercise encouraged.    HLD, LDL goal less than 70 / statin intolerance - Continue Repatha. Recent LDL at goal of <70.   Carotid artery disease -05/2020 60 to 79% R ICA stenosis. Plan for annual monitoring. Continue optimal lipid and BP control.  Follows with vascular surgery.  HTN - BP well controlled by home readings. Continue current antihypertensive regimen.  She will contact her office for blood pressure consistently greater than 130/80.  Palpitations /PAC/SVT /PAF /chronic anticoagulation / On Amiodarone therapy -  AV nodal blocking therapy avoided  due to  baseline bradycardia. 03/16/21 TSH 2.73, Hemoglobin 12.8, AST 18, ALT 12.  Continue to monitor every 6 months -no signs of amiodarone toxicity.  However marked bradycardia with heart rate 43 bpm today.  Endorses fatigue.  No lightheadedness, dizziness, near-syncope, syncope.  She is hesitant to stop amiodarone.  We will reduce to 50 mg daily and check in on heart rate in 2 weeks.  If heart rate persistently less than 50 bpm we will discontinue at that time.  Referred to EP for discussion of alternative AAD or management plan of atrial fibrillation.  Patient prefers to see Dr. Lovena Le as her husband also follows with him.  Appropriately anticoagulated with Eliquis 52m BID, denies bleeding complications. Does not meet criteria for reduced dose.   Chronic diastolic dysfunction - Euvolemic and well compensated on exam. Continue Lasix 268mQD.    Disposition: Referred to EP Dr. TaLovena Leoday. Follow up in 4 months with Dr. JoMartiniquer APP.  Signed, CaLoel DubonnetNP 05/17/2021, 10:29 AM [place]

## 2021-05-24 DIAGNOSIS — J40 Bronchitis, not specified as acute or chronic: Secondary | ICD-10-CM | POA: Diagnosis not present

## 2021-05-24 DIAGNOSIS — M1990 Unspecified osteoarthritis, unspecified site: Secondary | ICD-10-CM | POA: Diagnosis not present

## 2021-05-24 DIAGNOSIS — J329 Chronic sinusitis, unspecified: Secondary | ICD-10-CM | POA: Diagnosis not present

## 2021-05-30 ENCOUNTER — Telehealth: Payer: Self-pay | Admitting: Cardiology

## 2021-05-30 NOTE — Telephone Encounter (Signed)
Carly from [facility] called.   Pt c/o medication issue:  1. Name of Medication: REPATHA SURECLICK 940 MG/ML SOAJ  2. How are you currently taking this medication (dosage and times per day)?   3. Are you having a reaction (difficulty breathing--STAT)?   4. What is your medication issue? PCP office wanted to know if our office has done a Prior Auth for this patient and this medication.  The PCP office is working on an application for the patient at the [facility] [employer]. The PCP does not prescribe that medication so they do not have that information on file/   Fax to [facility]. PCP will be submitting the rest of the application

## 2021-05-31 NOTE — Telephone Encounter (Signed)
Will call pharmacy, patient and pcp after 8 am. Got healthwell approved for the pt to pick up the medication at the pharmacy: Pharmacy Card CARD NO. [id number]   CARD STATUS Active   BIN [id number]   PCN [id number]   PC GROUP [id number]   HELP DESK [phone]   PROVIDER PDMI   PROCESSOR PDMI

## 2021-05-31 NOTE — Telephone Encounter (Signed)
Lmom carly at [facility] that I got hwf grant approved and asked her to call if she has questions.

## 2021-05-31 NOTE — Telephone Encounter (Signed)
Called spoke w/pt and provided the hwf grant information and the pt voiced gratitude and understanding

## 2021-06-01 ENCOUNTER — Telehealth (HOSPITAL_BASED_OUTPATIENT_CLINIC_OR_DEPARTMENT_OTHER): Payer: Self-pay

## 2021-06-01 NOTE — Telephone Encounter (Addendum)
Called patient to review recent heart rate readings.   Patient says that her heart rate is ranging from the 40's to 50's but states that she has not been consistent in checking. Advised patient to check her heart rate everyday for the next week and I would follow up on 1/25 to see if medication adjustments need to be made. Will route back to Laurann [place], Np  just to update!         ----- Message from Loel Dubonnet, NP sent at 06/01/2021  8:48 AM EST ----- Regarding: HR check in During your follow up time this afternoon if you get the chance will you please call [name]? We saw her recently and HR was low in the 40s. She was hesitant to stop Amiodarone so we reduced to 21m QD.   If her heart rate at home has persistently been less than 50bpm, recommend she stop Amiodarone.  If her heart rate at home is persistently more than 50bpm, may continue at 577mQD.  Thanks!  [employer]

## 2021-06-06 ENCOUNTER — Telehealth: Payer: Self-pay

## 2021-06-06 NOTE — Telephone Encounter (Signed)
Please call [facility] at [phone] regarding paperwork for this patient.  Something about verifying phone number and address.

## 2021-06-06 NOTE — Telephone Encounter (Signed)
Called and spoke to a rep and gave information that they inquired about

## 2021-06-08 ENCOUNTER — Telehealth (HOSPITAL_BASED_OUTPATIENT_CLINIC_OR_DEPARTMENT_OTHER): Payer: Self-pay

## 2021-06-08 NOTE — Telephone Encounter (Signed)
Noted. Thanks! Has upcoming follow up with Dr. Lovena Le who can further address any changes to antiarrhythmic medications.   Loel Dubonnet, NP

## 2021-06-08 NOTE — Telephone Encounter (Addendum)
Called patient to see how recent HR has been. Patient endorses heart rates still in the low to mid 50's and will continue taking 28m amiodarone per CLaurann Montana NP order   ----- Message from KGerald Stabs RN sent at 06/01/2021  9:58 AM EST ----- Call patient to see how HR is doing   <50 stop amio  >50 may continue amio per C. WGilford Rile

## 2021-06-14 DIAGNOSIS — I10 Essential (primary) hypertension: Secondary | ICD-10-CM | POA: Diagnosis not present

## 2021-06-14 DIAGNOSIS — I259 Chronic ischemic heart disease, unspecified: Secondary | ICD-10-CM | POA: Diagnosis not present

## 2021-06-14 DIAGNOSIS — E7800 Pure hypercholesterolemia, unspecified: Secondary | ICD-10-CM | POA: Diagnosis not present

## 2021-06-16 ENCOUNTER — Other Ambulatory Visit: Payer: Self-pay

## 2021-06-16 ENCOUNTER — Encounter: Payer: Self-pay | Admitting: Internal Medicine

## 2021-06-16 ENCOUNTER — Ambulatory Visit: Payer: PPO | Admitting: Internal Medicine

## 2021-06-16 VITALS — BP 144/70 | HR 59 | Ht 66.0 in | Wt 135.0 lb

## 2021-06-16 DIAGNOSIS — I480 Paroxysmal atrial fibrillation: Secondary | ICD-10-CM | POA: Diagnosis not present

## 2021-06-16 DIAGNOSIS — R001 Bradycardia, unspecified: Secondary | ICD-10-CM

## 2021-06-16 NOTE — Progress Notes (Signed)
HPI Mrs. Touchette is referred today by Dr. Martinique and Laurann Montana, NP, for evaluation of palpitations and paroxysmal atrial arrhythmias.  The patient has occasional palpitations.  She has a history of spells of bradycardia and when she checks her blood pressure sometimes heart rates in the 40s.  She has improved after an additional reduction in her dose of amiodarone.  In June she wore a cardiac monitor which demonstrated atrial fibrillation.  There is also history of atrial flutter.  My review of her cardiac monitor at that time demonstrates nonsustained atrial tachycardia as well.  The patient also has nocturnal bradycardia with heart rates in the 30s.  She has not had syncope.  She has been placed on systemic anticoagulation without difficulty.  She denies chest pain or shortness of breath.  She has a history of mitral valve repair. Allergies  Allergen Reactions   Apple Anaphylaxis   Nadolol Hives and Anaphylaxis    Rash and nausea   Other Other (See Comments) and Anaphylaxis    apples Apples-anaphylaxis   Sulfa Antibiotics Hives and Anaphylaxis   Sulfamethoxazole Anaphylaxis   Sulfonamide Derivatives Anaphylaxis   Verapamil Other (See Comments) and Anaphylaxis    seizures   Crestor [Rosuvastatin] Other (See Comments)    Myalgias    Flagyl [Metronidazole] Hives     Current Outpatient Medications  Medication Sig Dispense Refill   amiodarone (PACERONE) 100 MG tablet Take 0.5 tablets (50 mg total) by mouth daily. 45 tablet 3   amoxicillin (AMOXIL) 500 MG capsule Before dentist.     apixaban (ELIQUIS) 5 MG TABS tablet Take 1 tablet (5 mg total) by mouth 2 (two) times daily. 180 tablet 3   ascorbic acid (VITAMIN C) 1000 MG tablet Take by mouth.     Bacillus Coagulans-Inulin (PROBIOTIC-PREBIOTIC) 1-250 BILLION-MG CAPS Take 1 capsule by mouth daily.     Calcium Carb-Cholecalciferol 600-800 MG-UNIT TABS Take 1 tablet by mouth daily.     ezetimibe (ZETIA) 10 MG tablet Take 1  tablet by mouth once daily 90 tablet 3   furosemide (LASIX) 20 MG tablet Take 1 tablet (20 mg total) by mouth daily as needed for edema. 90 tablet 3   losartan (COZAAR) 50 MG tablet Take 1 tablet by mouth once daily 90 tablet 0   mesalamine (APRISO) 0.375 g 24 hr capsule TAKE 1  BY MOUTH TWICE DAILY 60 capsule 11   Multiple Vitamin (MULTI-VITAMIN) tablet Take by mouth.     potassium chloride SA (KLOR-CON) 20 MEQ tablet Take 1 tablet (20 mEq total) by mouth daily as needed (when taking lasix.). 90 tablet 0   REPATHA SURECLICK 174 MG/ML SOAJ INJECT 140MG SUBCUTANEOUSLY EVERY 14 DAYS 2 mL 11   No current facility-administered medications for this visit.     Past Medical History:  Diagnosis Date   Anemia    "in my early teens"   Arthritis    neck and back   Atrial fibrillation ([place])    post-op after CABG;  treated with Amiodarone and coumadin until readmxn with CDiff colitis, perf colon and elevated LFTs (amio and coumadin stopped)   C. difficile colitis 0/8144   complicated post CABG course with prolonged admission, perforated sigmoid colon;  s/p sigmoid colectomy with end colostomy [name] procedure);  laparotomy wound infection with Pseudomonas treated with antibxs and secondary intention   Chronic kidney disease    hx kidney stones   Coronary artery disease    a. NSTEMI 7/12, cath: pLAD 30%,  mLAD 60-70%, dLAD 80%, oOM2 50%, mOM2 40%, trifurcation 80%, pRCA 25%, then occluded, L-R collats, EF 55%;   b. 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 Roxy Manns) // c. ETT 3/18: inf-lat ST depression of 1 mm at 4 min of exercise, frequent ectopy and hypertensive BP response // d. 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   GERD (gastroesophageal reflux disease)    H/O hiatal hernia    "slight; not having problems with it"   Hair loss    after heart surgery   Hemorrhoids    "none since hemorrhoid OR"   History of bronchitis     late 1980's   Hyperlipidemia    takes Pravastatin daily   Hypertension    takes Metoprolol daily   Mitral valve prolapse    was told this in the late 1990's; ;then with Dr.Mclean he states that she doesn't   Myocardial infarction ([place]) 11/2010   Palpitations 2003   hx of; Holter in 2003 with PACs and PVCs   Pericarditis 1980   hx of   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   Past Surgical History:  Procedure Laterality Date   ABDOMINAL HYSTERECTOMY     partial hysterectomy   APPENDECTOMY     as a child   BOTOX INJECTION  12/03/2015   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   CARDIAC CATHETERIZATION  11/21/10   CATARACT EXTRACTION, BILATERAL     CHOLECYSTECTOMY N/A 01/25/2016   Procedure: LAPAROSCOPIC CHOLECYSTECTOMY WITH INTRAOPERATIVE CHOLANGIOGRAM;  Surgeon: [name], MD;  Location: [facility];  Service: General;  Laterality: N/A;   COLONOSCOPY     COLOSTOMY RECONNECT     colostomy reversal  06/27/11   CORONARY ANGIOPLASTY WITH STENT PLACEMENT     CORONARY ARTERY BYPASS GRAFT  11-25-2010   CABG X3; LIMA to LAD, SVG to PDA, SVG to OM, EVH via right thigh   ESOPHAGOGASTRODUODENOSCOPY     FLEXIBLE SIGMOIDOSCOPY  11/09/2011   Procedure: FLEXIBLE SIGMOIDOSCOPY;  Surgeon: Milus Banister, MD;  Location: [facility];  Service: Endoscopy;  Laterality: N/A;   HEMORRHOID SURGERY  ~ 2005   x 2   OOPHORECTOMY  2005   left; w/"mass removal"   PROCTOSCOPY  06/27/2011   Procedure: PROCTOSCOPY;  Surgeon: Gayland Curry, MD;  Location: [facility];  Service: General;  Laterality: N/A;   SIGMOIDECTOMY  12/18/2010   with end colostomy   TUBAL LIGATION       Family History  Problem Relation  Age of Onset   Heart attack Father 32   Heart disease Father        massive heart attack   Coronary artery disease Mother        [place]   Heart disease Mother    Anesthesia problems Neg Hx    Hypotension Neg Hx    Malignant hyperthermia Neg Hx    Pseudochol deficiency Neg Hx    Colon cancer Neg Hx    Esophageal cancer Neg Hx  Stomach cancer Neg Hx    Rectal cancer Neg Hx      Social History   Socioeconomic History   Marital status: Married    Spouse name: Not on file   Number of children: 1   Years of education: Not on file   Highest education level: Not on file  Occupational History   Occupation: LPN    Employer: [employer]  Tobacco Use   Smoking status: Never   Smokeless tobacco: Never  Vaping Use   Vaping Use: Never used  Substance and Sexual Activity   Alcohol use: No   Drug use: No   Sexual activity: Yes    Birth control/protection: Post-menopausal  Other Topics Concern   Not on file  Social History Narrative   Right handed   Lives with husband one story home   Social Determinants of Health   Financial Resource Strain: Not on file  Food Insecurity: Not on file  Transportation Needs: Not on file  Physical Activity: Not on file  Stress: Not on file  Social Connections: Not on file  Intimate Partner Violence: Not on file     BP (!) 144/70    Pulse (!) 59    Ht 5' 6"  (1.676 m)    Wt 135 lb (61.2 kg)    SpO2 99%    BMI 21.79 kg/m   Physical Exam:  Well appearing 81 year old woman, NAD HEENT: Unremarkable Neck:  No JVD, no thyromegally Lymphatics:  No adenopathy Back:  No CVA tenderness Lungs:  Clear, with no wheezes, rales, or rhonchi HEART:  Regular rate rhythm, no murmurs, no rubs, no clicks Abd:  soft, positive bowel sounds, no organomegally, no rebound, no guarding Ext:  2 plus pulses, no edema, no cyanosis, no clubbing Skin:  No rashes no nodules Neuro:  CN II through XII intact, motor grossly intact  EKG -normal  sinus rhythm with PACs in a bigeminal fashion   Assess/Plan:  1.  Nonsustained atrial fibrillation and flutter -she appears to be fairly well controlled with her very low-dose of amiodarone.  At higher doses she complained of fatigue and weakness associated with low heart rates.  She does have occasional bursts where she feels like her heart is out of rhythm.  I have asked the patient to undergo watchful waiting.  If she has worsening of her symptomatic atrial arrhythmias, she would need to uptitrate her amiodarone. 2.  Sinus node dysfunction -she is presently stable but I suspect she will require additional amiodarone and likely require permanent backup pacemaker insertion so that we can keep her heart rate controlled with bradycardia treated with the pacemaker and her propensity for atrial arrhythmias to be treated by low-dose amiodarone. 3.  Systemic anticoagulation-she has had no bleeding on Eliquis.  She will continue. 4.  Hypertension -her blood pressure is usually well controlled at home.  It is slightly elevated today.  She is encouraged to maintain a low-sodium diet and continue her medications.  Cristopher Peru, MD

## 2021-06-16 NOTE — Patient Instructions (Addendum)
Medication Instructions:  Your physician recommends that you continue on your current medications as directed. Please refer to the Current Medication list given to you today.  Labwork: None ordered.  Testing/Procedures: None ordered.  Follow-Up: Your physician wants you to follow-up in: late June 2023 with Cristopher Peru, MD   Any Other Special Instructions Will Be Listed Below (If Applicable).  If you need a refill on your cardiac medications before your next appointment, please call your pharmacy.

## 2021-06-27 ENCOUNTER — Other Ambulatory Visit: Payer: Self-pay | Admitting: Cardiology

## 2021-07-08 DIAGNOSIS — I10 Essential (primary) hypertension: Secondary | ICD-10-CM | POA: Diagnosis not present

## 2021-07-08 DIAGNOSIS — Z6822 Body mass index (BMI) 22.0-22.9, adult: Secondary | ICD-10-CM | POA: Diagnosis not present

## 2021-07-08 DIAGNOSIS — L309 Dermatitis, unspecified: Secondary | ICD-10-CM | POA: Diagnosis not present

## 2021-07-08 DIAGNOSIS — Z0000 Encounter for general adult medical examination without abnormal findings: Secondary | ICD-10-CM | POA: Diagnosis not present

## 2021-07-27 DIAGNOSIS — J329 Chronic sinusitis, unspecified: Secondary | ICD-10-CM | POA: Diagnosis not present

## 2021-07-27 DIAGNOSIS — J40 Bronchitis, not specified as acute or chronic: Secondary | ICD-10-CM | POA: Diagnosis not present

## 2021-07-29 DIAGNOSIS — J189 Pneumonia, unspecified organism: Secondary | ICD-10-CM | POA: Diagnosis not present

## 2021-07-29 DIAGNOSIS — R0981 Nasal congestion: Secondary | ICD-10-CM | POA: Diagnosis not present

## 2021-07-29 DIAGNOSIS — R051 Acute cough: Secondary | ICD-10-CM | POA: Diagnosis not present

## 2021-08-22 ENCOUNTER — Other Ambulatory Visit: Payer: Self-pay | Admitting: Cardiology

## 2021-09-05 ENCOUNTER — Other Ambulatory Visit: Payer: Self-pay | Admitting: *Deleted

## 2021-09-05 ENCOUNTER — Ambulatory Visit: Payer: PPO | Admitting: Gastroenterology

## 2021-09-05 ENCOUNTER — Other Ambulatory Visit: Payer: Self-pay

## 2021-09-05 DIAGNOSIS — I6523 Occlusion and stenosis of bilateral carotid arteries: Secondary | ICD-10-CM

## 2021-09-05 NOTE — Progress Notes (Signed)
Error

## 2021-09-11 NOTE — Progress Notes (Signed)
? ?Date:  09/20/2021  ? ?ID:  [name], DOB 1941-04-10, MRN [id number] ? ? ?PCP:  Angelina Sheriff, MD  ?Cardiologist:  Leon Goodnow Martinique, MD  ?Electrophysiologist:  None  ? ?Evaluation Performed:  Follow-Up Visit ? ?Chief Complaint:  CAD ? ?History of Present Illness:   ? ?[name] is a 81 y.o. 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 Roxy Manns (LIMA-LAD, SVG-OM, SVG-PDA), postop atrial fibrillation, spastic dysphonia, carotid artery disease, hypertension and hyperlipidemia.  Cardiopulmonary stress test in 2017 showed no cardiac or ventilatory limitation.  ETT March 2018 showed hypertensive response.  Previous Myoview in April 2018 was low risk but no ischemia or scar.  She is intolerant of Crestor.  Patient was seen on 07/08/2018 at which time she complained of increasing dyspnea on exertion going up hills.  This is similar to her previous symptoms prior to CABG.  Echocardiogram obtained on 07/15/2018 revealed EF 60 to 65%, mild AI, moderately elevated RV pressure. Repeat Myoview was performed on 07/19/2018 which showed EF 67%, normal perfusion.  Her dyspnea on exertion was felt to be related to diastolic heart failure and she was placed on low-dose Lasix and potassium supplement.  Losartan was increased to 100 mg daily. ? ?She was seen in our lipid clinic in June. Options for further lipid management were discussed. She was started on Repatha. She has been compliant with this.  ? ?She presented to [facility] ED on December 24, 2019 with unstable angina.  Subsequent troponins were normal. Transferred to [facility] in [place] cardiac cath showing patent bypass grafts. This included a LIMA to the LAD, SVG to PDA and apparently SVG to diagonal (instead of OM as noted in op report).  She underwent PCI of the proximal LCx with a 2.75 x 12 mm Synergy stent. Echo was unremarkable except for moderate TR. She was started on Plavix.  I reviewed films from her cardiac cath in [facility].  She has patent LIMA to the LAD, SVG to OM1, and SVG to PDA. There was a severe stenosis in the proximal LCx that supplies the second and third OMs distally. This stenosis was successfully stented. The mid to distal LCx has moderate diffuse disease. ? ? ?She was seen 01/05/21 with intermittent palpitations.  Event monitor showed some NSVT, nonsustained SVT, atrial fibrillation- low burden. Placed on Eliquis. ?  ?Hospitalized 01/2021 with atrial flutter with RVR at [facility].  She was started on amiodarone drip and converted to NSR.  She was then transitioned to p.o. amiodarone. CT chest angiography 01/31/21 normal heart size, evidence of CABG, no evidence of aortic dissection, few tree-in-bud opacities and centrilobular nodules in right upper lobe suggesting infectious/inflammatory bronchioloitis, marked celiac artery origin narrowing, ventral abdominal wall laxity with superimposed hernia containing nonobstructed small bowel loops. Her chest discomfort was though to be due to arrhythmia as troponin x3 unremarkable.  ? ?She was evaluated by Dr Lovena Le in February for bradycardia. Has some Sinus node dysfunction. Bradycardia improved on lower dose of amiodarone. For now watching closely. If increased arrhythmia may need higher amiodarone dose in which case she may need a pacemaker.  ? ?On follow up today she has been under a lot of stress recently. Her mother in law passed recently in [place]. Her sone in [place] had to have a leg amputated due to DM. She did note an episode of heart pounding 2 weeks ago like she had with AFib. Lasted 1.5 to  2 hours then resolved. Other than that she has some intermittent brief flutters only lasting seconds. Denies any chest pain or dyspnea. No edema. No dizziness or syncope.  ? ? ?Past Medical History:  ?Diagnosis Date  ? Anemia   ? "in my early teens"  ? Arthritis   ? neck and back  ? Atrial fibrillation ([place])   ? post-op after CABG;  treated with Amiodarone and coumadin until  readmxn with CDiff colitis, perf colon and elevated LFTs (amio and coumadin stopped)  ? C. difficile colitis 12/2010  ? complicated post CABG course with prolonged admission, perforated sigmoid colon;  s/p sigmoid colectomy with end colostomy [name] procedure);  laparotomy wound infection with Pseudomonas treated with antibxs and secondary intention  ? Chronic kidney disease   ? hx kidney stones  ? Coronary artery disease   ? a. NSTEMI 7/12, cath: pLAD 30%, mLAD 60-70%, dLAD 80%, oOM2 50%, mOM2 40%, trifurcation 80%, pRCA 25%, then occluded, L-R collats, EF 55%;   b. 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 Roxy Manns) // c. ETT 3/18: inf-lat ST depression of 1 mm at 4 min of exercise, frequent ectopy and hypertensive BP response // d. Myoview 4/18: EF 68, no ischemia; low risk   ? 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 GERD (gastroesophageal reflux disease)   ? H/O hiatal hernia   ? "slight; not having problems with it"  ? Hair loss   ? after heart surgery  ? Hemorrhoids   ? "none since hemorrhoid OR"  ? History of bronchitis   ? late 1980's  ? Hyperlipidemia   ? takes Pravastatin daily  ? Hypertension   ? takes Metoprolol daily  ? Mitral valve prolapse   ? was told this in the late 1990's; ;then with Dr.Mclean he states that she doesn't  ? Myocardial infarction ([place]) 11/2010  ? Palpitations 2003  ? hx of; Holter in 2003 with PACs and PVCs  ? Pericarditis 1980  ? hx of  ? 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 DR.OWEN  ? S/P colostomy ([place])   ? due to perf sigmoid colon in setting of CDiff colitis  ? Seizures ([place])   ? "w/verapimil"  ? Surgical wound infection   ? Pseudomonas; wound VAC; secondary intention  ? 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 ?Past Surgical History:  ?Procedure Laterality Date  ? ABDOMINAL HYSTERECTOMY    ? partial hysterectomy  ? APPENDECTOMY    ? as a child  ? BOTOX INJECTION  12/03/2015  ? 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 CARDIAC CATHETERIZATION  11/21/10  ?  CATARACT EXTRACTION, BILATERAL    ? CHOLECYSTECTOMY N/A 01/25/2016  ? Procedure: LAPAROSCOPIC CHOLECYSTECTOMY WITH INTRAOPERATIVE CHOLANGIOGRAM;  Surgeon: [name], MD;  Location: [facility];  Service: General;  Laterality: N/A;  ? COLONOSCOPY    ? COLOSTOMY RECONNECT    ? colostomy reversal  06/27/11  ? CORONARY ANGIOPLASTY WITH STENT PLACEMENT    ? CORONARY ARTERY BYPASS GRAFT  11-25-2010  ? CABG X3; LIMA to LAD, SVG to PDA, SVG to OM, EVH via right thigh  ? ESOPHAGOGASTRODUODENOSCOPY    ? FLEXIBLE SIGMOIDOSCOPY  11/09/2011  ? Procedure: FLEXIBLE SIGMOIDOSCOPY;  Surgeon: Milus Banister, MD;  Location: [facility];  Service: Endoscopy;  Laterality: N/A;  ? HEMORRHOID SURGERY  ~ 2005  ? x 2  ? OOPHORECTOMY  2005  ? left; w/"mass removal"  ? PROCTOSCOPY  06/27/2011  ?  Procedure: PROCTOSCOPY;  Surgeon: Gayland Curry, MD;  Location: [facility];  Service: General;  Laterality: N/A;  ? SIGMOIDECTOMY  12/18/2010  ? with end colostomy  ? TUBAL LIGATION    ?  ? ?Current Meds  ?Medication Sig  ? amiodarone (PACERONE) 100 MG tablet Take 0.5 tablets (50 mg total) by mouth daily.  ? amoxicillin (AMOXIL) 500 MG capsule Before dentist.  ? apixaban (ELIQUIS) 5 MG TABS tablet Take 1 tablet (5 mg total) by mouth 2 (two) times daily.  ? ascorbic acid (VITAMIN C) 1000 MG tablet Take by mouth.  ? Bacillus Coagulans-Inulin (PROBIOTIC-PREBIOTIC) 1-250 BILLION-MG CAPS Take 1 capsule by mouth daily.  ? Calcium Carb-Cholecalciferol 600-800 MG-UNIT TABS Take 1 tablet by mouth daily.  ? ezetimibe (ZETIA) 10 MG tablet Take 1 tablet by mouth once daily  ? furosemide (LASIX) 20 MG tablet Take 1 tablet (20 mg total) by mouth daily as needed for edema.  ? losartan (COZAAR) 50 MG tablet Take 1 tablet by mouth once daily  ? mesalamine (APRISO) 0.375 g 24 hr capsule TAKE 1  BY MOUTH TWICE DAILY  ? Multiple Vitamin (MULTI-VITAMIN) tablet Take by mouth.  ? potassium chloride SA (KLOR-CON) 20 MEQ tablet Take 1 tablet (20 mEq total) by mouth daily as needed (when  taking lasix.).  ? REPATHA SURECLICK 270 MG/ML SOAJ INJECT 140MG SUBCUTANEOUSLY EVERY 14 DAYS  ?  ? ?Allergies:   Apple, Nadolol, Other, Sulfa antibiotics, Sulfamethoxazole, Sulfonamide derivatives, Verapam

## 2021-09-15 NOTE — Progress Notes (Signed)
?Office Note  ? ? ? ?CC:  follow up ?Requesting Provider:  Angelina Sheriff, MD ? ?HPI: [name] is a 81 y.o. (1940-10-27) female who presents for routine follow up of  carotid artery stenosis. We have been following her right 60-79% ICA stenosis. She has no history of TIA or stroke. Today she denies any amaurosis fugax or other visual changes, slurred speech, facial drooping, unilateral upper or lower extremity weakness or numbness. She does report fatigue in her legs after prolonged ambulation but she does not have any claudication, rest pain or tissue loss.  ? ?She was noted to have a palpable Abdominal aortic pulse. She was scheduled to return for screening AAA duplex but she had decided to cancel as she was uncertain why she needed the evaluation. She denies any abdominal pain or back pain. No family history of AAA ? ?The pt is not on a statin for cholesterol management. She is on Repatha and Zetia ?The pt is not on a daily aspirin.   Other AC:  Eliquis ?The pt is on CCB, ARB for hypertension.   ?The pt is not diabetic.   ?Tobacco hx:  never ? ?Past Medical History:  ?Diagnosis Date  ? Anemia   ? "in my early teens"  ? Arthritis   ? neck and back  ? Atrial fibrillation ([place])   ? post-op after CABG;  treated with Amiodarone and coumadin until readmxn with CDiff colitis, perf colon and elevated LFTs (amio and coumadin stopped)  ? C. difficile colitis 12/2010  ? complicated post CABG course with prolonged admission, perforated sigmoid colon;  s/p sigmoid colectomy with end colostomy [name] procedure);  laparotomy wound infection with Pseudomonas treated with antibxs and secondary intention  ? Chronic kidney disease   ? hx kidney stones  ? Coronary artery disease   ? a. NSTEMI 7/12, cath: pLAD 30%, mLAD 60-70%, dLAD 80%, oOM2 50%, mOM2 40%, trifurcation 80%, pRCA 25%, then occluded, L-R collats, EF 55%;   b. 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 Roxy Manns) // c. ETT 3/18: inf-lat ST  depression of 1 mm at 4 min of exercise, frequent ectopy and hypertensive BP response // d. Myoview 4/18: EF 68, no ischemia; low risk   ? 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 GERD (gastroesophageal reflux disease)   ? H/O hiatal hernia   ? "slight; not having problems with it"  ? Hair loss   ? after heart surgery  ? Hemorrhoids   ? "none since hemorrhoid OR"  ? History of bronchitis   ? late 1980's  ? Hyperlipidemia   ? takes Pravastatin daily  ? Hypertension   ? takes Metoprolol daily  ? Mitral valve prolapse   ? was told this in the late 1990's; ;then with Dr.Mclean he states that she doesn't  ? Myocardial infarction ([place]) 11/2010  ? Palpitations 2003  ? hx of; Holter in 2003 with PACs and PVCs  ? Pericarditis 1980  ? hx of  ? 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 DR.OWEN  ? S/P colostomy ([place])   ? due to perf sigmoid colon in setting of CDiff colitis  ? Seizures ([place])   ? "w/verapimil"  ? Surgical wound infection   ? Pseudomonas; wound VAC; secondary intention  ? 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 ? ?Past Surgical History:  ?Procedure Laterality Date  ? ABDOMINAL HYSTERECTOMY    ? partial hysterectomy  ? APPENDECTOMY    ?  as a child  ? BOTOX INJECTION  12/03/2015  ? 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 CARDIAC CATHETERIZATION  11/21/10  ? CATARACT EXTRACTION, BILATERAL    ? CHOLECYSTECTOMY N/A 01/25/2016  ? Procedure: LAPAROSCOPIC CHOLECYSTECTOMY WITH INTRAOPERATIVE CHOLANGIOGRAM;  Surgeon: [name], MD;  Location: [facility];  Service: General;  Laterality: N/A;  ? COLONOSCOPY    ? COLOSTOMY RECONNECT    ? colostomy reversal  06/27/11  ? CORONARY ANGIOPLASTY WITH STENT PLACEMENT    ? CORONARY ARTERY BYPASS GRAFT  11-25-2010  ? CABG X3; LIMA to LAD, SVG to PDA, SVG to OM, EVH via right thigh  ? ESOPHAGOGASTRODUODENOSCOPY    ? FLEXIBLE SIGMOIDOSCOPY  11/09/2011  ? Procedure: FLEXIBLE SIGMOIDOSCOPY;  Surgeon: Milus Banister, MD;  Location: [facility];  Service: Endoscopy;  Laterality: N/A;  ?  HEMORRHOID SURGERY  ~ 2005  ? x 2  ? OOPHORECTOMY  2005  ? left; w/"mass removal"  ? PROCTOSCOPY  06/27/2011  ? Procedure: PROCTOSCOPY;  Surgeon: Gayland Curry, MD;  Location: [place];  Service: General;  Laterality: N/A;  ? SIGMOIDECTOMY  12/18/2010  ? with end colostomy  ? TUBAL LIGATION    ? ? ?Social History  ? ?Socioeconomic History  ? Marital status: Married  ?  Spouse name: Not on file  ? Number of children: 1  ? Years of education: Not on file  ? Highest education level: Not on file  ?Occupational History  ? Occupation: LPN  ?  Employer: [employer]  ?Tobacco Use  ? Smoking status: Never  ?  Passive exposure: Never  ? Smokeless tobacco: Never  ?Vaping Use  ? Vaping Use: Never used  ?Substance and Sexual Activity  ? Alcohol use: No  ? Drug use: No  ? Sexual activity: Yes  ?  Birth control/protection: Post-menopausal  ?Other Topics Concern  ? Not on file  ?Social History Narrative  ? Right handed  ? Lives with husband one story home  ? ?Social Determinants of Health  ? ?Financial Resource Strain: Not on file  ?Food Insecurity: Not on file  ?Transportation Needs: Not on file  ?Physical Activity: Not on file  ?Stress: Not on file  ?Social Connections: Not on file  ?Intimate Partner Violence: Not on file  ? ?Family History  ?Problem Relation Age of Onset  ? Heart attack Father 30  ? Heart disease Father   ?     massive heart attack  ? Coronary artery disease Mother   ?     [place]  ? Heart disease Mother   ? Anesthesia problems Neg Hx   ? Hypotension Neg Hx   ? Malignant hyperthermia Neg Hx   ? Pseudochol deficiency Neg Hx   ? Colon cancer Neg Hx   ? Esophageal cancer Neg Hx   ? Stomach cancer Neg Hx   ? Rectal cancer Neg Hx   ? ? ?Current Outpatient Medications  ?Medication Sig Dispense Refill  ? amiodarone (PACERONE) 100 MG tablet Take 0.5 tablets (50 mg total) by mouth daily. 45 tablet 3  ? amoxicillin (AMOXIL) 500 MG capsule Before dentist.    ? apixaban (ELIQUIS) 5 MG TABS tablet Take 1  tablet (5 mg total) by mouth 2 (two) times daily. 180 tablet 3  ? ascorbic acid (VITAMIN C) 1000 MG tablet Take by mouth.    ? Bacillus Coagulans-Inulin (PROBIOTIC-PREBIOTIC) 1-250 BILLION-MG CAPS Take 1 capsule by mouth daily.    ?  Calcium Carb-Cholecalciferol 600-800 MG-UNIT TABS Take 1 tablet by mouth daily.    ? ezetimibe (ZETIA) 10 MG tablet Take 1 tablet by mouth once daily 90 tablet 1  ? furosemide (LASIX) 20 MG tablet Take 1 tablet (20 mg total) by mouth daily as needed for edema. 90 tablet 3  ? losartan (COZAAR) 50 MG tablet Take 1 tablet by mouth once daily 90 tablet 0  ? mesalamine (APRISO) 0.375 g 24 hr capsule TAKE 1  BY MOUTH TWICE DAILY 60 capsule 11  ? Multiple Vitamin (MULTI-VITAMIN) tablet Take by mouth.    ? potassium chloride SA (KLOR-CON) 20 MEQ tablet Take 1 tablet (20 mEq total) by mouth daily as needed (when taking lasix.). 90 tablet 0  ? REPATHA SURECLICK 938 MG/ML SOAJ INJECT 140MG SUBCUTANEOUSLY EVERY 14 DAYS 2 mL 11  ? ?No current facility-administered medications for this visit.  ? ? ?Allergies  ?Allergen Reactions  ? Apple Anaphylaxis  ? Nadolol Hives and Anaphylaxis  ?  Rash and nausea  ? Other Other (See Comments) and Anaphylaxis  ?  apples ?Apples-anaphylaxis  ? Sulfa Antibiotics Hives and Anaphylaxis  ? Sulfamethoxazole Anaphylaxis  ? Sulfonamide Derivatives Anaphylaxis  ? Verapamil Other (See Comments) and Anaphylaxis  ?  seizures  ? Crestor [Rosuvastatin] Other (See Comments)  ?  Myalgias ?  ? Flagyl [Metronidazole] Hives  ? ? ? ?REVIEW OF SYSTEMS:  ? ?[X]  denotes positive finding, [ ]  denotes negative finding ?Cardiac  Comments:  ?Chest pain or chest pressure:    ?Shortness of breath upon exertion:    ?Short of breath when lying flat:    ?Irregular heart rhythm:    ?    ?Vascular    ?Pain in calf, thigh, or hip brought on by ambulation:    ?Pain in feet at night that wakes you up from your sleep:     ?Blood clot in your veins:    ?Leg swelling:     ?    ?Pulmonary    ?Oxygen at  home:    ?Productive cough:     ?Wheezing:     ?    ?Neurologic    ?Sudden weakness in arms or legs:     ?Sudden numbness in arms or legs:     ?Sudden onset of difficulty speaking or slurred speech:    ?Temporary

## 2021-09-19 ENCOUNTER — Encounter: Payer: Self-pay | Admitting: Physician Assistant

## 2021-09-19 ENCOUNTER — Ambulatory Visit: Payer: PPO | Admitting: Physician Assistant

## 2021-09-19 ENCOUNTER — Ambulatory Visit (HOSPITAL_COMMUNITY)
Admission: RE | Admit: 2021-09-19 | Discharge: 2021-09-19 | Disposition: A | Discharge status: Home / Self Care | Payer: PPO | Source: Ambulatory Visit | Attending: Surgery | Admitting: Surgery

## 2021-09-19 VITALS — BP 142/60 | HR 51 | Temp 98.3°F | Resp 20 | Ht 66.0 in | Wt 133.6 lb

## 2021-09-19 DIAGNOSIS — R0989 Other specified symptoms and signs involving the circulatory and respiratory systems: Secondary | ICD-10-CM

## 2021-09-19 DIAGNOSIS — I6523 Occlusion and stenosis of bilateral carotid arteries: Secondary | ICD-10-CM | POA: Insufficient documentation

## 2021-09-20 ENCOUNTER — Ambulatory Visit: Payer: PPO | Admitting: Cardiology

## 2021-09-20 ENCOUNTER — Encounter: Payer: Self-pay | Admitting: Cardiology

## 2021-09-20 VITALS — BP 150/70 | HR 40 | Ht 66.0 in | Wt 133.0 lb

## 2021-09-20 DIAGNOSIS — I480 Paroxysmal atrial fibrillation: Secondary | ICD-10-CM

## 2021-09-20 DIAGNOSIS — E785 Hyperlipidemia, unspecified: Secondary | ICD-10-CM

## 2021-09-20 DIAGNOSIS — I2510 Atherosclerotic heart disease of native coronary artery without angina pectoris: Secondary | ICD-10-CM | POA: Diagnosis not present

## 2021-09-20 DIAGNOSIS — R001 Bradycardia, unspecified: Secondary | ICD-10-CM | POA: Diagnosis not present

## 2021-09-20 DIAGNOSIS — I5032 Chronic diastolic (congestive) heart failure: Secondary | ICD-10-CM | POA: Diagnosis not present

## 2021-09-20 DIAGNOSIS — I10 Essential (primary) hypertension: Secondary | ICD-10-CM

## 2021-09-20 DIAGNOSIS — I25118 Atherosclerotic heart disease of native coronary artery with other forms of angina pectoris: Secondary | ICD-10-CM

## 2021-09-20 LAB — BASIC METABOLIC PANEL
BUN/Creatinine Ratio: 19 (ref 12–28)
BUN: 19 mg/dL (ref 8–27)
CO2: 25 mmol/L (ref 20–29)
Calcium: 9.7 mg/dL (ref 8.7–10.3)
Chloride: 103 mmol/L (ref 96–106)
Creatinine, Ser: 0.98 mg/dL (ref 0.57–1.00)
Glucose: 96 mg/dL (ref 70–99)
Potassium: 4.4 mmol/L (ref 3.5–5.2)
Sodium: 143 mmol/L (ref 134–144)
eGFR: 59 mL/min/{1.73_m2} — ABNORMAL LOW (ref >59)

## 2021-09-20 LAB — HEPATIC FUNCTION PANEL
ALT: 17 IU/L (ref 0–32)
AST: 23 IU/L (ref 0–40)
Albumin: 4.5 g/dL (ref 3.7–4.7)
Alkaline Phosphatase: 70 IU/L (ref 44–121)
Bilirubin Total: 0.6 mg/dL (ref 0.0–1.2)
Bilirubin, Direct: 0.19 mg/dL (ref 0.00–0.40)
Total Protein: 7.4 g/dL (ref 6.0–8.5)

## 2021-09-20 LAB — LIPID PANEL
Chol/HDL Ratio: 1.7 ratio (ref 0.0–4.4)
Cholesterol, Total: 133 mg/dL (ref 100–199)
HDL: 77 mg/dL (ref >39)
LDL Chol Calc (NIH): 39 mg/dL (ref 0–99)
Triglycerides: 90 mg/dL (ref 0–149)
VLDL Cholesterol Cal: 17 mg/dL (ref 5–40)

## 2021-09-20 LAB — TSH: TSH: 2.25 u[IU]/mL (ref 0.450–4.500)

## 2021-09-20 NOTE — Addendum Note (Signed)
Addended by: Kathyrn Lass on: 09/20/2021 09:38 AM ? ? Modules accepted: Orders ? ?

## 2021-09-23 ENCOUNTER — Other Ambulatory Visit: Payer: Self-pay | Admitting: *Deleted

## 2021-09-23 DIAGNOSIS — I6523 Occlusion and stenosis of bilateral carotid arteries: Secondary | ICD-10-CM

## 2021-09-23 DIAGNOSIS — R0989 Other specified symptoms and signs involving the circulatory and respiratory systems: Secondary | ICD-10-CM

## 2021-09-26 ENCOUNTER — Other Ambulatory Visit: Payer: Self-pay | Admitting: Cardiology

## 2021-10-14 ENCOUNTER — Other Ambulatory Visit: Payer: Self-pay | Admitting: Gastroenterology

## 2021-10-26 ENCOUNTER — Other Ambulatory Visit: Payer: Self-pay | Admitting: Cardiology

## 2021-10-26 DIAGNOSIS — I480 Paroxysmal atrial fibrillation: Secondary | ICD-10-CM

## 2021-10-26 NOTE — Telephone Encounter (Signed)
Prescription refill request for Eliquis received. Indication: Afib  Last office visit: 09/20/21 ([place])  Scr: 81 Age: [age] (09/20/21)  Weight: 60.3kg  Appropriate dose and refill sent to requested pharmacy.

## 2021-11-03 ENCOUNTER — Other Ambulatory Visit: Payer: Self-pay | Admitting: Gastroenterology

## 2021-11-07 ENCOUNTER — Encounter: Payer: Self-pay | Admitting: Internal Medicine

## 2021-11-07 ENCOUNTER — Ambulatory Visit: Payer: PPO | Admitting: Internal Medicine

## 2021-11-07 VITALS — BP 146/72 | HR 49 | Ht 66.0 in | Wt 137.0 lb

## 2021-11-07 DIAGNOSIS — I480 Paroxysmal atrial fibrillation: Secondary | ICD-10-CM | POA: Diagnosis not present

## 2021-11-09 ENCOUNTER — Other Ambulatory Visit: Payer: Self-pay

## 2021-11-09 ENCOUNTER — Ambulatory Visit: Payer: PPO | Admitting: Gastroenterology

## 2021-11-09 ENCOUNTER — Encounter: Payer: Self-pay | Admitting: Gastroenterology

## 2021-11-09 VITALS — BP 120/60 | HR 50 | Ht 66.0 in | Wt 135.0 lb

## 2021-11-09 DIAGNOSIS — K51819 Other ulcerative colitis with unspecified complications: Secondary | ICD-10-CM

## 2021-11-09 MED ORDER — MESALAMINE ER 0.375 G PO CP24: ORAL_CAPSULE | ORAL | 11 refills | Status: DC

## 2021-11-09 NOTE — Patient Instructions (Signed)
If you are age 79 or older, your body mass index should be between 23-30. Your Body mass index is 21.79 kg/m. If this is out of the aforementioned range listed, please consider follow up with your Primary Care Provider. ________________________________________________________  The [place] [facility] providers would like to encourage you to use [facility] to communicate with providers for non-urgent requests or questions.  Due to long hold times on the telephone, sending your provider a message by [facility] may be a faster and more efficient way to get a response.  Please allow 48 business hours for a response.  Please remember that this is for non-urgent requests.  _______________________________________________________  DECREASEDorthy Cooler to one pill daily  Please call our office in 2 months to report on hour you are feeling.    You will need a follow up appointment in 1 year (June 2024). We will contact you with appointment date and time.   Thank you for entrusting me with your care and choosing [facility].  Dr Edison Nasuti

## 2021-11-09 NOTE — Progress Notes (Signed)
Review of pertinent gastrointestinal problems:   1. C. Diff coltis, eventually perforated sigmoid, s/p segmental colectomy, reversed 2/13.   2. 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 She underwent 3-4 sequential sigmoidoscopies with dilation of the stricture. Eventually dilated up to 20 mm with very good results in her diarrhea. Last examination was August 2013, this was a full colonoscopy. No polyps were seen. She did have proximal to the site of the previous stricture, the colon was somewhat edematous.   3. routine risk for colon cancer, next colonoscopy August 2023 4. Chronic colitis, July 2014 colonoscopy (mildly inflamed colon throughout, biopsies confirmed chronic inflammation). This was done for diarrhea. CC anastomosis at the time was open to 75m, repeat dilation performed. Pred 244mdaily was started. Hot flashes, racing heart and so changed to uceris 35m32mer day. Sept 2014; started mesalamine 4.8 grams per day,  with good response (apriso).  Tapered to 2 pills once daily 2021, and she tolerated this well as of follow-up appointment in 2022.   HPI: This is a very pleasant 81 32ar old woman  I last saw her little over a year ago, her weight is up 2 pounds since that office visit.  Blood work 09/2021 showed normal LFTs  She has been doing absolutely fine on 2 Apriso pills once daily for the past year.  She has 1 or 2 solid brown stools daily.  No abdominal pains, no serious diarrhea and no bleeding.  She would like to cut back on the medicine completely if possible.  It is not free and she would like to simply take less chemicals if at all possible. ROS: complete GI ROS as described in HPI, all other review negative.  Constitutional:  No unintentional weight loss   Past Medical History:  Diagnosis Date   Anemia    "in my early teens"   Arthritis    neck and back   [place]  [place];  treated with Amiodarone  and coumadin until readmxn with CDiff colitis, perf colon and elevated LFTs (amio and coumadin stopped)   C. difficile colitis 8/28/0321complicated post CABG course with prolonged admission, perforated sigmoid colon;  s/p sigmoid colectomy with end colostomy (HaJeanette Capriceocedure);  laparotomy wound infection with Pseudomonas treated with antibxs and secondary intention   Chronic kidney disease    hx kidney stones   Coronary artery disease    a. NSTEMI 7/12, cath: pLAD 30%, mLAD 60-70%, dLAD 80%, oOM2 50%, mOM2 40%, trifurcation 80%, pRCA 25%, then occluded, L-R collats, EF 55%;   b. 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 OweRoxy Manns/ c. ETT 3/18: inf-lat ST depression of 1 mm at 4 min of exercise, frequent ectopy and hypertensive BP response // d. 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   GERD (gastroesophageal reflux disease)    H/O hiatal hernia    "slight; not having problems with it"   Hair loss    after heart surgery   Hemorrhoids    "none since hemorrhoid OR"   History of bronchitis    late 1980's   Hyperlipidemia    takes Pravastatin daily   Hypertension    takes Metoprolol daily   Mitral valve prolapse    was told this in the late 1990's; ;then with Dr.Mclean he states that she doesn't   [facility]   Palpitations 2003  hx of; Holter in 2003 with PACs and PVCs   Pericarditis 1980   hx of   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   DR.OWEN   S/P colostomy ([place])    due to perf sigmoid colon in setting of CDiff colitis   Seizures ([place])    "w/verapimil"   Surgical wound infection    Pseudomonas; wound VAC; secondary intention   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    Past Surgical History:  Procedure Laterality Date   ABDOMINAL HYSTERECTOMY     partial hysterectomy   APPENDECTOMY     as a child   BOTOX INJECTION  12/03/2015   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   CARDIAC  CATHETERIZATION  11/21/10   CATARACT EXTRACTION, BILATERAL     CHOLECYSTECTOMY N/A 01/25/2016   Procedure: LAPAROSCOPIC CHOLECYSTECTOMY WITH INTRAOPERATIVE CHOLANGIOGRAM;  Surgeon: [name], MD;  Location: [facility];  Service: General;  Laterality: N/A;   COLONOSCOPY     COLOSTOMY RECONNECT     colostomy reversal  06/27/11   CORONARY ANGIOPLASTY WITH STENT PLACEMENT     CORONARY ARTERY BYPASS GRAFT  11-25-2010   CABG X3; LIMA to LAD, SVG to PDA, SVG to OM, EVH via right thigh   ESOPHAGOGASTRODUODENOSCOPY     FLEXIBLE SIGMOIDOSCOPY  11/09/2011   Procedure: FLEXIBLE SIGMOIDOSCOPY;  Surgeon: Milus Banister, MD;  Location[facility];  Service: Endoscopy;  Laterality: N/A;   HEMORRHOID SURGERY  ~ 2005   x 2   OOPHORECTOMY  2005   left; w/"mass removal"   PROCTOSCOPY  06/27/2011   Procedure: PROCTOSCOPY;  Surgeon: Gayland Curry, MD;  Location: [place];  Service: General;  Laterality: N/A;   SIGMOIDECTOMY  12/18/2010   with end colostomy   TUBAL LIGATION      Current Outpatient Medications  Medication Instructions   amiodarone (PACERONE) 50 mg, Oral, Daily   amoxicillin (AMOXIL) 500 MG capsule Before dentist.   apixaban (ELIQUIS) 5 MG TABS tablet Take 1 tablet by mouth twice daily   ascorbic acid (VITAMIN C) 1000 MG tablet Oral   Bacillus Coagulans-Inulin (PROBIOTIC-PREBIOTIC) 1-250 BILLION-MG CAPS 1 capsule, Oral, Daily   Calcium Carb-Cholecalciferol 600-800 MG-UNIT TABS 1 tablet, Oral, Daily   ezetimibe (ZETIA) 10 MG tablet Take 1 tablet by mouth once daily   furosemide (LASIX) 20 mg, Oral, Daily PRN   losartan (COZAAR) 50 MG tablet Take 1 tablet by mouth once daily   mesalamine (APRISO) 0.375 g 24 hr capsule TAKE 1  BY MOUTH TWICE DAILY   Multiple Vitamin (MULTI-VITAMIN) tablet Oral   potassium chloride SA (KLOR-CON) 20 MEQ tablet 20 mEq, Oral, Daily PRN   REPATHA SURECLICK 710 MG/ML SOAJ INJECT 140MG SUBCUTANEOUSLY EVERY 14 DAYS    Allergies as of 11/09/2021 - Review Complete  11/09/2021  Allergen Reaction Noted   Apple Anaphylaxis 09/19/2021   Nadolol Hives and Anaphylaxis 08/02/2009   Other Other (See Comments) and Anaphylaxis 06/22/2011   Sulfa antibiotics Hives and Anaphylaxis 11/20/2011   Sulfamethoxazole Anaphylaxis 05/03/2015   Sulfonamide derivatives Anaphylaxis    Verapamil Other (See Comments) and Anaphylaxis    Crestor [rosuvastatin] Other (See Comments) 06/19/2019   Flagyl [metronidazole] Hives 04/30/2018    Family History  Problem Relation Age of Onset   Heart attack Father 56   Heart disease Father        massive heart attack   Coronary artery disease Mother        [place]   Heart disease Mother  Anesthesia problems Neg Hx    Hypotension Neg Hx    Malignant hyperthermia Neg Hx    Pseudochol deficiency Neg Hx    Colon cancer Neg Hx    Esophageal cancer Neg Hx    Stomach cancer Neg Hx    Rectal cancer Neg Hx     Social History   Socioeconomic History   Marital status: Married    Spouse name: Not on file   Number of children: 1   Years of education: Not on file   Highest education level: Not on file  Occupational History   Occupation: LPN    Employer: [employer]  Tobacco Use   Smoking status: Never    Passive exposure: Never   Smokeless tobacco: Never  Vaping Use   Vaping Use: Never used  Substance and Sexual Activity   Alcohol use: No   Drug use: No   Sexual activity: Yes    Birth control/protection: Post-menopausal  Other Topics Concern   Not on file  Social History Narrative   Right handed   Lives with husband one story home   Social Determinants of Health   Financial Resource Strain: Not on file  Food Insecurity: Not on file  Transportation Needs: Not on file  Physical Activity: Not on file  Stress: Not on file  Social Connections: Not on file  Intimate Partner Violence: Not on file     Physical Exam: BP 120/60   Pulse (!) 50   Ht 5' 6"  (1.676 m)   Wt 135 lb (61.2 kg)   BMI  21.79 kg/m  Constitutional: generally well-appearing Psychiatric: alert and oriented x3 Abdomen: soft, nontender, nondistended, no obvious ascites, no peritoneal signs, normal bowel sounds No peripheral edema noted in lower extremities  Assessment and plan: 81 y.o. female with ulcerative colitis  Her symptoms have been under very good control on 2 Apriso once daily for the past 2 years.  She would like to try cutting back completely.  I recommended tapering a bit more slowly and so she will start taking 1 pill once daily for the next 2 months and then call to report on how she is doing.  She is doing absolutely fine with no GI symptom worsening that I think probably coming off completely will be safe.  We will also put her in for repeat office visit in 1 year from now.  Please see the "Patient Instructions" section for addition details about the plan.  Owens Loffler, MD [facility] 11/09/2021, 2:14 PM   Total time on date of encounter was 20 minutes (this included time spent preparing to see the patient reviewing records; obtaining and/or reviewing separately obtained history; performing a medically appropriate exam and/or evaluation; counseling and educating the patient and family if present; ordering medications, tests or procedures if applicable; and documenting clinical information in the health record).

## 2021-12-08 ENCOUNTER — Other Ambulatory Visit: Payer: Self-pay | Admitting: Cardiology

## 2021-12-17 ENCOUNTER — Other Ambulatory Visit: Payer: Self-pay | Admitting: Cardiology

## 2022-01-04 DIAGNOSIS — M79671 Pain in right foot: Secondary | ICD-10-CM | POA: Diagnosis not present

## 2022-01-04 DIAGNOSIS — L84 Corns and callosities: Secondary | ICD-10-CM | POA: Diagnosis not present

## 2022-01-04 DIAGNOSIS — G8929 Other chronic pain: Secondary | ICD-10-CM | POA: Diagnosis not present

## 2022-01-19 ENCOUNTER — Telehealth: Payer: Self-pay | Admitting: Gastroenterology

## 2022-01-19 NOTE — Telephone Encounter (Signed)
Left message on machine to call back  

## 2022-01-19 NOTE — Telephone Encounter (Signed)
Pt called states she has finished her rx Apriso and is feeling fine.

## 2022-01-19 NOTE — Telephone Encounter (Signed)
The pt states that she has weaned off apriso and is doing very well.  She discussed stopping with Dr Ardis Hughs and he agreed.  She will call back if she has any return of any symptoms.

## 2022-02-06 ENCOUNTER — Other Ambulatory Visit: Payer: Self-pay | Admitting: Cardiology

## 2022-02-28 DIAGNOSIS — Z23 Encounter for immunization: Secondary | ICD-10-CM | POA: Diagnosis not present

## 2022-03-10 NOTE — Progress Notes (Signed)
Date:  03/24/2022   ID:  Dominique, Terry 14-Oct-1940, MRN [id number]   PCP:  Angelina Sheriff, MD  Cardiologist:  Adeoluwa Silvers Martinique, MD  Electrophysiologist:  None   Evaluation Performed:  Follow-Up Visit  Chief Complaint:  CAD  History of Present Illness:    [name] is a 81 y.o. 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 Roxy Manns (LIMA-LAD, SVG-OM, SVG-PDA), postop atrial fibrillation, spastic dysphonia, carotid artery disease, hypertension and hyperlipidemia.  Cardiopulmonary stress test in 2017 showed no cardiac or ventilatory limitation.  ETT March 2018 showed hypertensive response.  Previous Myoview in April 2018 was low risk but no ischemia or scar.  She is intolerant of Crestor.  Patient was seen on 07/08/2018 at which time she complained of increasing dyspnea on exertion going up hills.  This is similar to her previous symptoms prior to CABG.  Echocardiogram obtained on 07/15/2018 revealed EF 60 to 65%, mild AI, moderately elevated RV pressure. Repeat Myoview was performed on 07/19/2018 which showed EF 67%, normal perfusion.  Her dyspnea on exertion was felt to be related to diastolic heart failure and she was placed on low-dose Lasix and potassium supplement.  Losartan was increased to 100 mg daily.  She was seen in our lipid clinic in June. Options for further lipid management were discussed. She was started on Repatha. She has been compliant with this.   She presented to [facility] ED on December 24, 2019 with unstable angina.  Subsequent troponins were normal. Transferred to [facility] in [place] cardiac cath showing patent bypass grafts. This included a LIMA to the LAD, SVG to PDA and apparently SVG to diagonal (instead of OM as noted in op report).  She underwent PCI of the proximal LCx with a 2.75 x 12 mm Synergy stent. Echo was unremarkable except for moderate TR. She was started on Plavix.  I reviewed films from her cardiac cath in [facility]. She has patent LIMA to the LAD, SVG to OM1, and SVG to PDA. There was a severe stenosis in the proximal LCx that supplies the second and third OMs distally. This stenosis was successfully stented. The mid to distal LCx has moderate diffuse disease.   She was seen 01/05/21 with intermittent palpitations.  Event monitor showed some NSVT, nonsustained SVT, atrial fibrillation- low burden. Placed on Eliquis.   Hospitalized 01/2021 with atrial flutter with RVR at [facility].  She was started on amiodarone drip and converted to NSR.  She was then transitioned to p.o. amiodarone. CT chest angiography 01/31/21 normal heart size, evidence of CABG, no evidence of aortic dissection, few tree-in-bud opacities and centrilobular nodules in right upper lobe suggesting infectious/inflammatory bronchioloitis, marked celiac artery origin narrowing, ventral abdominal wall laxity with superimposed hernia containing nonobstructed small bowel loops. Her chest discomfort was though to be due to arrhythmia as troponin x3 unremarkable.   She was evaluated by Dr Lovena Le in February for bradycardia. Has some Sinus node dysfunction. Bradycardia improved on lower dose of amiodarone. For now watching closely. If increased arrhythmia may need higher amiodarone dose in which case she may need a pacemaker.   She was seen in June by Dr Lovena Le and continued on low dose amiodarone.   On follow up today she is doing very well. No chest pain or dyspnea. Staying active. Rare fluttering that is short lived. Feels well and is tolerating medication well.   Past Medical History:  Diagnosis Date  Anemia    "in my early teens"   Arthritis    neck and back   Atrial fibrillation (HCC)    post-op after CABG;  treated with Amiodarone and coumadin until readmxn with CDiff colitis, perf colon and elevated LFTs (amio and coumadin stopped)   C. difficile colitis 12/2705   complicated post CABG course with prolonged admission, perforated  sigmoid colon;  s/p sigmoid colectomy with end colostomy [name] procedure);  laparotomy wound infection with Pseudomonas treated with antibxs and secondary intention   Chronic kidney disease    hx kidney stones   Coronary artery disease    a. NSTEMI 7/12, cath: pLAD 30%, mLAD 60-70%, dLAD 80%, oOM2 50%, mOM2 40%, trifurcation 80%, pRCA 25%, then occluded, L-R collats, EF 55%;   b. 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 Roxy Manns) // c. ETT 3/18: inf-lat ST depression of 1 mm at 4 min of exercise, frequent ectopy and hypertensive BP response // d. 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   GERD (gastroesophageal reflux disease)    H/O hiatal hernia    "slight; not having problems with it"   Hair loss    after heart surgery   Hemorrhoids    "none since hemorrhoid OR"   History of bronchitis    late 1980's   Hyperlipidemia    takes Pravastatin daily   Hypertension    takes Metoprolol daily   Mitral valve prolapse    was told this in the late 1990's; ;then with Dr.Mclean he states that she doesn't   Myocardial infarction ([place]) 11/2010   Palpitations 2003   hx of; Holter in 2003 with PACs and PVCs   Pericarditis 1980   hx of   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   DR.OWEN   S/P colostomy ([place])    due to perf sigmoid colon in setting of CDiff colitis   Seizures ([place])    "w/verapimil"   Surgical wound infection    Pseudomonas; wound VAC; secondary intention   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   Past Surgical History:  Procedure Laterality Date   ABDOMINAL HYSTERECTOMY     partial hysterectomy   APPENDECTOMY     as a child   BOTOX INJECTION  12/03/2015   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   CARDIAC CATHETERIZATION  11/21/10   CATARACT EXTRACTION, BILATERAL     CHOLECYSTECTOMY N/A 01/25/2016   Procedure: LAPAROSCOPIC CHOLECYSTECTOMY WITH INTRAOPERATIVE CHOLANGIOGRAM;  Surgeon: [name], MD;  Location: [facility]  ORS;  Service: General;  Laterality: N/A;   COLONOSCOPY     COLOSTOMY RECONNECT     colostomy reversal  06/27/11   CORONARY ANGIOPLASTY WITH STENT PLACEMENT     CORONARY ARTERY BYPASS GRAFT  11-25-2010   CABG X3; LIMA to LAD, SVG to PDA, SVG to OM, EVH via right thigh   ESOPHAGOGASTRODUODENOSCOPY     FLEXIBLE SIGMOIDOSCOPY  11/09/2011   Procedure: FLEXIBLE SIGMOIDOSCOPY;  Surgeon: Milus Banister, MD;  Location: [facility] ENDOSCOPY;  Service: Endoscopy;  Laterality: N/A;   HEMORRHOID SURGERY  ~ 2005   x 2   OOPHORECTOMY  2005   left; w/"mass removal"   PROCTOSCOPY  06/27/2011   Procedure: PROCTOSCOPY;  Surgeon: Gayland Curry, MD;  Location: [place];  Service: General;  Laterality: N/A;   SIGMOIDECTOMY  12/18/2010   with end colostomy   TUBAL LIGATION       Current  Meds  Medication Sig   amiodarone (PACERONE) 100 MG tablet Take 0.5 tablets (50 mg total) by mouth daily.   apixaban (ELIQUIS) 5 MG TABS tablet Take 1 tablet by mouth twice daily   ascorbic acid (VITAMIN C) 1000 MG tablet Take by mouth.   Bacillus Coagulans-Inulin (PROBIOTIC-PREBIOTIC) 1-250 BILLION-MG CAPS Take 1 capsule by mouth daily.   Calcium Carb-Cholecalciferol 600-800 MG-UNIT TABS Take 1 tablet by mouth daily.   Evolocumab (REPATHA SURECLICK) 462 MG/ML SOAJ INJECT 140 MG SUBCUTANEOUSLY EVERY 14 DAYS   losartan (COZAAR) 50 MG tablet Take 1 tablet by mouth once daily   mesalamine (APRISO) 0.375 g 24 hr capsule TAKE 1  BY MOUTH TWICE DAILY   Multiple Vitamin (MULTI-VITAMIN) tablet Take by mouth.   [DISCONTINUED] ezetimibe (ZETIA) 10 MG tablet Take 1 tablet by mouth once daily     Allergies:   Apple, Nadolol, Other, Sulfa antibiotics, Sulfamethoxazole, Sulfonamide derivatives, Verapamil, Crestor [rosuvastatin], and Flagyl [metronidazole]   Social History   Tobacco Use   Smoking status: Never    Passive exposure: Never   Smokeless tobacco: Never  Vaping Use   Vaping Use: Never used  Substance Use Topics   Alcohol use: No    Drug use: No     Family Hx: The patient's family history includes Coronary artery disease in her mother; Heart attack (age of onset: 26) in her father; Heart disease in her father and mother. There is no history of Anesthesia problems, Hypotension, Malignant hyperthermia, Pseudochol deficiency, Colon cancer, Esophageal cancer, Stomach cancer, or Rectal cancer.  ROS:   Please see the history of present illness.    All other systems reviewed and are negative.   Prior CV studies:   The following studies were reviewed today:  Echo 07/17/18: IMPRESSIONS      1. The left ventricle has normal systolic function with an ejection fraction of 60-65%. The cavity size was normal. Left ventricular diastolic Doppler parameters are consistent with pseudonormalization Elevated left atrial and left ventricular  end-diastolic pressures.  2. The right ventricle has normal systolic function. The cavity was normal. There is no increase in right ventricular wall thickness. Right ventricular systolic pressure is moderately elevated with an estimated pressure of 43.7 mmHg.  3. The mitral valve is degenerative. Moderate thickening of the mitral valve leaflets with moderate involvement of the chordae tendinae. Mild calcification of the mitral valve leaflet.  4. The tricuspid valve is normal in structure.  5. The aortic valve is tricuspid Aortic valve regurgitation is mild by color flow Doppler.  6. The pulmonic valve was normal in structure.  7. The inferior vena cava was dilated in size with <50% respiratory variability.  8. The interatrial septum appears to be lipomatous.   Myoview 07/19/2018 Study Highlights      The left ventricular ejection fraction is hyperdynamic (>65%). Nuclear stress EF: 67%. No T wave inversion was noted during stress. There was no ST segment deviation noted during stress. The study is normal. This is a low risk study.   Normal perfusion. LVEF 67% with normal wall motion. This  is a low risk study. No change compared to prior study in 2018.   Myoview 10/07/20: Study Highlights    The left ventricular ejection fraction is normal (55-65%). Nuclear stress EF: 62%. Blood pressure demonstrated a normal response to exercise. ST segment depression was noted during stress in the II, III, aVF, V5 and V6 leads. The study is normal. This is a low risk study.   Normal  stress nuclear study with no ischemia or infarction.  Gated ejection fraction 62% with normal wall motion.  Patient did complain of atypical chest pain during the study and there was note of ST depression in the inferolateral leads.   Event monitor 10/28/20: Study Highlights    Normal sinus rhythm Rare PACs with runs of SVT/PAT. longest lasting 19 seconds Rare PVCs with infrequent NSVT 4-6 beats versus aberrancy Paroxysmal Afib with rate 73-164. mean 124 bpm. burden is low at < 1%. Minimal HR 31 at 7:29 am. Unclear if this is post termination.  Labs/Other Tests and Data Reviewed:    EKG:  none today    Recent Labs: 09/20/2021: ALT 17; BUN 19; Creatinine, Ser 0.98; Potassium 4.4; Sodium 143; TSH 2.250   Recent Lipid Panel Lab Results  Component Value Date/Time   CHOL 133 09/20/2021 09:51 AM   TRIG 90 09/20/2021 09:51 AM   HDL 77 09/20/2021 09:51 AM   CHOLHDL 1.7 09/20/2021 09:51 AM   CHOLHDL 3 04/13/2014 09:34 AM   LDLCALC 39 09/20/2021 09:51 AM   LDLDIRECT 81.8 07/20/2011 03:04 PM   Dated 07/02/18: cholesterol 161, triglycerides 81, HDL 52, LDL 93. CMET and TSH normal  Dated 12/24/19: BNP 300. Glucose 144, BUN 22, otherwise CMET normal. plts 132K. WBC 3.2K.   Wt Readings from Last 3 Encounters:  03/24/22 132 lb 9.6 oz (60.1 kg)  11/09/21 135 lb (61.2 kg)  11/07/21 137 lb (62.1 kg)     Objective:    Vital Signs:  BP 122/62   Pulse (!) 50   Ht 5' 6.5" (1.689 m)   Wt 132 lb 9.6 oz (60.1 kg)   SpO2 100%   BMI 21.08 kg/m    GENERAL:  Well appearing WF in NAD HEENT:  PERRL, EOMI, sclera  are clear. Oropharynx is clear. NECK:  No jugular venous distention, carotid upstroke brisk and symmetric, no bruits, no thyromegaly or adenopathy LUNGS:  Clear to auscultation bilaterally CHEST:  Unremarkable HEART:  RRR,  PMI not displaced or sustained,S1 and S2 within normal limits, no S3, no S4: no clicks, no rubs, no murmurs ABD:  Soft, nontender. BS +, no masses or bruits. No hepatomegaly, no splenomegaly EXT:  2 + pulses throughout, no edema, no cyanosis no clubbing. She has some bruising at her right groin site but no hematoma. SKIN:  Warm and dry.  No rashes NEURO:  Alert and oriented x 3. Cranial nerves II through XII intact. PSYCH:  Cognitively intact    ASSESSMENT & PLAN:    1.  CAD s/p CABG x 3 in 2012 for multivessel CAD. Normal Myoview in May 2020. Had  unstable angina in August 2021. S/p DES of the proximal LCx. Patent grafts.  No longer on antiplatelet therapy since on Eliquis.  Repeat Myoview in May 2022 was normal.    2. Hypercholesterolemia. intolerant of statins. On Zetia. Now on Repatha with excellent response. LDL down to 39. I think we can discontinue Zetia now.    3. Carotid arterial disease. 60-79% RICA stenosis.  Follow yearly.    4. HTN controlled.   5. Zoster neuralgia.   6. Paroxysmal afib/SVT/ sinus node dysfunction.   Now anticoagulated. No sustained arrhythmia. Not a candidate for beta blocker. Continue low dose amiodarone. Labs Ok   7. Chronic diastolic dysfunction. Off diuretics now and euvolemic      Medication Adjustments/Labs and Tests Ordered: Current medicines are reviewed at length with the patient today.  Concerns regarding medicines are outlined above.  Tests Ordered: No orders of the defined types were placed in this encounter.    Medication Changes: No orders of the defined types were placed in this encounter.    Follow Up:  6 months   Signed, Mishaal Lansdale Martinique, MD  03/24/2022 10:17 AM    [facility]  [facility]  Addendum    Keonte Daubenspeck Martinique MD, [facility]

## 2022-03-23 ENCOUNTER — Telehealth: Payer: Self-pay | Admitting: Cardiology

## 2022-03-23 NOTE — Telephone Encounter (Signed)
Spoke with pt, aware no lab work needed.

## 2022-03-23 NOTE — Telephone Encounter (Signed)
Patient calling to see if the she need to fast before her appt for labs. Please advise

## 2022-03-23 NOTE — Telephone Encounter (Signed)
Spoke with pt, aware she had labs drawn in may and they were all excellent. Do not think she needs labs but will check with dr Martinique to make sure.

## 2022-03-24 ENCOUNTER — Encounter: Payer: Self-pay | Admitting: Cardiology

## 2022-03-24 ENCOUNTER — Ambulatory Visit: Payer: PPO | Attending: Cardiology | Admitting: Cardiology

## 2022-03-24 VITALS — BP 122/62 | HR 50 | Ht 66.5 in | Wt 132.6 lb

## 2022-03-24 DIAGNOSIS — I10 Essential (primary) hypertension: Secondary | ICD-10-CM | POA: Diagnosis not present

## 2022-03-24 DIAGNOSIS — Z79899 Other long term (current) drug therapy: Secondary | ICD-10-CM

## 2022-03-24 DIAGNOSIS — I25118 Atherosclerotic heart disease of native coronary artery with other forms of angina pectoris: Secondary | ICD-10-CM

## 2022-03-24 DIAGNOSIS — E785 Hyperlipidemia, unspecified: Secondary | ICD-10-CM

## 2022-03-24 DIAGNOSIS — I480 Paroxysmal atrial fibrillation: Secondary | ICD-10-CM

## 2022-03-25 ENCOUNTER — Other Ambulatory Visit: Payer: Self-pay | Admitting: Cardiology

## 2022-04-20 ENCOUNTER — Telehealth: Payer: Self-pay

## 2022-04-20 NOTE — Patient Outreach (Signed)
  Care Coordination   Initial Visit Note   04/20/2022 Name: [name] MRN: [id number] DOB: [birth year]  [name] is a 81 y.o. year old female who sees [place], Angelique Blonder, MD for primary care. I spoke with  Royetta Asal by phone today.  What matters to the patients health and wellness today?  Placed call to patient today to review and offer [facility] care coordination program.  Patient reports that she is doing well and denies any needs today.      SDOH assessments and interventions completed:  No     Care Coordination Interventions:  No, not indicated   Follow up plan: No further intervention required.   Encounter Outcome:  Pt. Refused   Tomasa Rand, RN, BSN, CEN [facility] [employer] [phone]

## 2022-05-03 ENCOUNTER — Other Ambulatory Visit: Payer: Self-pay | Admitting: Cardiology

## 2022-05-03 DIAGNOSIS — I480 Paroxysmal atrial fibrillation: Secondary | ICD-10-CM

## 2022-05-03 NOTE — Telephone Encounter (Signed)
Prescription refill request for Eliquis received. Indication:afib Last office visit:11/23 Scr:0.9 Age: 81 Weight:60.1  kg  Prescription refilled

## 2022-05-12 DIAGNOSIS — J329 Chronic sinusitis, unspecified: Secondary | ICD-10-CM | POA: Diagnosis not present

## 2022-05-12 DIAGNOSIS — J40 Bronchitis, not specified as acute or chronic: Secondary | ICD-10-CM | POA: Diagnosis not present

## 2022-05-12 DIAGNOSIS — I259 Chronic ischemic heart disease, unspecified: Secondary | ICD-10-CM | POA: Diagnosis not present

## 2022-05-12 DIAGNOSIS — M791 Myalgia, unspecified site: Secondary | ICD-10-CM | POA: Diagnosis not present

## 2022-07-03 ENCOUNTER — Other Ambulatory Visit (HOSPITAL_BASED_OUTPATIENT_CLINIC_OR_DEPARTMENT_OTHER): Payer: Self-pay | Admitting: Family

## 2022-07-03 NOTE — Telephone Encounter (Signed)
Rx(s) sent to pharmacy electronically.  

## 2022-07-14 ENCOUNTER — Ambulatory Visit: Payer: PPO | Admitting: Physician Assistant

## 2022-07-14 ENCOUNTER — Ambulatory Visit (INDEPENDENT_AMBULATORY_CARE_PROVIDER_SITE_OTHER)
Admission: RE | Admit: 2022-07-14 | Discharge: 2022-07-14 | Disposition: A | Discharge status: Home / Self Care | Payer: PPO | Source: Ambulatory Visit | Attending: Vascular Surgery | Admitting: Vascular Surgery

## 2022-07-14 ENCOUNTER — Ambulatory Visit (HOSPITAL_COMMUNITY)
Admission: RE | Admit: 2022-07-14 | Discharge: 2022-07-14 | Disposition: A | Discharge status: Home / Self Care | Payer: PPO | Source: Ambulatory Visit | Attending: Vascular Surgery | Admitting: Vascular Surgery

## 2022-07-14 DIAGNOSIS — I6523 Occlusion and stenosis of bilateral carotid arteries: Secondary | ICD-10-CM | POA: Insufficient documentation

## 2022-07-14 DIAGNOSIS — R0989 Other specified symptoms and signs involving the circulatory and respiratory systems: Secondary | ICD-10-CM

## 2022-07-14 NOTE — Progress Notes (Signed)
[name] is a 82 y.o. (16-Jan-1941) female who presents for routine follow up of  carotid artery stenosis. We have been following her right 60-79% ICA stenosis. She has no history of TIA or stroke. Today she denies any amaurosis fugax or other visual changes, slurred speech, facial drooping, unilateral upper or lower extremity weakness or numbness.   She was noted to have a palpable Abdominal aortic pulse.  She will have a AAA duplex for baseline.    She stays very busy, states her cholesterol is excellent and she does not tolerate Statins.  She is managed with Repatha and Elquis for Afib.     Past Medical History:  Diagnosis Date   Anemia    "in my early teens"   Arthritis    neck and back   Atrial fibrillation ([place])    post-op after CABG;  treated with Amiodarone and coumadin until readmxn with CDiff colitis, perf colon and elevated LFTs (amio and coumadin stopped)   C. difficile colitis 0000000   complicated post CABG course with prolonged admission, perforated sigmoid colon;  s/p sigmoid colectomy with end colostomy [name] procedure);  laparotomy wound infection with Pseudomonas treated with antibxs and secondary intention   Chronic kidney disease    hx kidney stones   Coronary artery disease    a. NSTEMI 7/12, cath: pLAD 30%, mLAD 60-70%, dLAD 80%, oOM2 50%, mOM2 40%, trifurcation 80%, pRCA 25%, then occluded, L-R collats, EF 55%;   b. 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 Roxy Manns) // c. ETT 3/18: inf-lat ST depression of 1 mm at 4 min of exercise, frequent ectopy and hypertensive BP response // d. 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   GERD (gastroesophageal reflux disease)    H/O hiatal hernia    "slight; not having problems with it"   Hair loss    after heart surgery   Hemorrhoids    "none since hemorrhoid OR"   History of bronchitis    late 1980's    Hyperlipidemia    takes Pravastatin daily   Hypertension    takes Metoprolol daily   Mitral valve prolapse    was told this in the late 1990's; ;then with Dr.Mclean he states that she doesn't   Myocardial infarction ([place]) 11/2010   Palpitations 2003   hx of; Holter in 2003 with PACs and PVCs   Pericarditis 1980   hx of   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   DR.OWEN   S/P colostomy ([place])    due to perf sigmoid colon in setting of CDiff colitis   Seizures ([place])    "w/verapimil"   Surgical wound infection    Pseudomonas; wound VAC; secondary intention   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    Past Surgical History:  Procedure Laterality Date   ABDOMINAL HYSTERECTOMY     partial hysterectomy   APPENDECTOMY     as a child   BOTOX INJECTION  12/03/2015   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   CARDIAC CATHETERIZATION  11/21/10   CATARACT EXTRACTION, BILATERAL     CHOLECYSTECTOMY N/A 01/25/2016   Procedure: LAPAROSCOPIC CHOLECYSTECTOMY WITH INTRAOPERATIVE CHOLANGIOGRAM;  Surgeon: [name], MD;  Location: [facility];  Service: General;  Laterality: N/A;   COLONOSCOPY     COLOSTOMY RECONNECT     colostomy reversal  06/27/11   CORONARY ANGIOPLASTY WITH STENT PLACEMENT     CORONARY ARTERY BYPASS GRAFT  11-25-2010   CABG X3; LIMA to LAD, SVG to PDA, SVG to OM, EVH via right thigh   ESOPHAGOGASTRODUODENOSCOPY     FLEXIBLE SIGMOIDOSCOPY  11/09/2011   Procedure: FLEXIBLE SIGMOIDOSCOPY;  Surgeon: Milus Banister, MD;  Location: [facility];  Service: Endoscopy;  Laterality: N/A;   HEMORRHOID SURGERY  ~ 2005   x 2   OOPHORECTOMY  2005   left; w/"mass removal"   PROCTOSCOPY  06/27/2011   Procedure: PROCTOSCOPY;  Surgeon: Gayland Curry, MD;  Location: [facility];  Service: General;  Laterality: N/A;   SIGMOIDECTOMY  12/18/2010   with end colostomy   TUBAL LIGATION       Social History Social History   Tobacco Use   Smoking status: Never    Passive exposure: Never   Smokeless tobacco: Never   Vaping Use   Vaping Use: Never used  Substance Use Topics   Alcohol use: No   Drug use: No    Family History Family History  Problem Relation Age of Onset   Heart attack Father 65   Heart disease Father        massive heart attack   Coronary artery disease Mother        had bypass in the pass   Heart disease Mother    Anesthesia problems Neg Hx    Hypotension Neg Hx    Malignant hyperthermia Neg Hx    Pseudochol deficiency Neg Hx    Colon cancer Neg Hx    Esophageal cancer Neg Hx    Stomach cancer Neg Hx    Rectal cancer Neg Hx     Allergies  Allergies  Allergen Reactions   Apple Anaphylaxis   Nadolol Hives and Anaphylaxis    Rash and nausea   Other Other (See Comments) and Anaphylaxis    apples Apples-anaphylaxis   Sulfa Antibiotics Hives and Anaphylaxis   Sulfamethoxazole Anaphylaxis   Sulfonamide Derivatives Anaphylaxis   Verapamil Other (See Comments) and Anaphylaxis    seizures   Crestor [Rosuvastatin] Other (See Comments)    Myalgias    Flagyl [Metronidazole] Hives     Current Outpatient Medications  Medication Sig Dispense Refill   amiodarone (PACERONE) 100 MG tablet Take 1/2 (one-half) tablet by mouth once daily 45 tablet 1   apixaban (ELIQUIS) 5 MG TABS tablet Take 1 tablet by mouth twice daily 180 tablet 1   ascorbic acid (VITAMIN C) 1000 MG tablet Take by mouth.     Bacillus Coagulans-Inulin (PROBIOTIC-PREBIOTIC) 1-250 BILLION-MG CAPS Take 1 capsule by mouth daily.     Calcium Carb-Cholecalciferol 600-800 MG-UNIT TABS Take 1 tablet by mouth daily.     Evolocumab (REPATHA SURECLICK) [id number] MG/ML SOAJ INJECT 140 MG SUBCUTANEOUSLY EVERY 14 DAYS 2 mL 11   losartan (COZAAR) 50 MG tablet Take 1 tablet by mouth once daily 90 tablet 3   Multiple Vitamin (MULTI-VITAMIN) tablet Take by mouth.     No current facility-administered medications for this visit.    ROS:   General:  No weight loss, Fever, chills  HEENT: No recent headaches, no nasal  bleeding, no visual changes, no sore throat  Neurologic: No dizziness, blackouts, seizures. No recent symptoms of stroke or mini- stroke. No recent episodes of slurred speech, or temporary blindness.  Cardiac: No recent episodes of chest pain/pressure, no shortness of breath at rest.  No shortness of breath with exertion.  Denies history of atrial fibrillation or irregular heartbeat  Vascular: No history of rest pain in feet.  No history of claudication.  No history of non-healing ulcer, No history of DVT   Pulmonary: No home oxygen, no productive cough, no hemoptysis,  No asthma or wheezing  Musculoskeletal:  '[ ]'$  Arthritis, '[ ]'$  Low back pain,  '[ ]'$  Joint pain  Hematologic:No history of hypercoagulable state.  No history of easy bleeding.  No history of anemia  Gastrointestinal: No hematochezia or melena,  No gastroesophageal reflux, no trouble swallowing  Urinary: '[ ]'$  chronic Kidney disease, '[ ]'$  on HD - '[ ]'$  MWF or '[ ]'$  TTHS, '[ ]'$  Burning with urination, '[ ]'$  Frequent urination, '[ ]'$  Difficulty urinating;   Skin: No rashes  Psychological: No history of anxiety,  No history of depression   Physical Examination  There were no vitals filed for this visit.  There is no height or weight on file to calculate BMI.  General:  Alert and oriented, no acute distress HEENT: Normal Neck: No bruit or JVD Pulmonary: Clear to auscultation bilaterally Cardiac: Regular Rate and Rhythm without murmur Gastrointestinal: Soft, non-tender, non-distended, no mass, no scars Skin: No rash Extremity Pulses:   radial pulses bilaterally Musculoskeletal: No deformity or edema  Neurologic: Upper and lower extremity motor 5/5 and symmetric  DATA:     Right Carotid Findings:  +----------+--------+--------+--------+------------------+--------+           PSV cm/sEDV cm/sStenosisPlaque DescriptionComments  +----------+--------+--------+--------+------------------+--------+  CCA Prox  96      14                                           +----------+--------+--------+--------+------------------+--------+  CCA Mid   83      15                                          +----------+--------+--------+--------+------------------+--------+  CCA Distal71      15              heterogenous                +----------+--------+--------+--------+------------------+--------+  ICA Prox  317     60      40-59%  heterogenous                +----------+--------+--------+--------+------------------+--------+  ICA Mid   116     23                                          +----------+--------+--------+--------+------------------+--------+  ICA Distal96      23                                          +----------+--------+--------+--------+------------------+--------+  ECA      173     14                                          +----------+--------+--------+--------+------------------+--------+   +----------+--------+-------+----------------+-------------------+  PSV cm/sEDV cmsDescribe        Arm Pressure (mmHG)  +----------+--------+-------+----------------+-------------------+  Subclavian131    3      Multiphasic, [id number]                  +----------+--------+-------+----------------+-------------------+   +---------+--------+--+--------+-+---------+  VertebralPSV cm/s52EDV cm/s9Antegrade  +---------+--------+--+--------+-+---------+      Left Carotid Findings:  +----------+--------+--------+--------+------------------+--------+           PSV cm/sEDV cm/sStenosisPlaque DescriptionComments  +----------+--------+--------+--------+------------------+--------+  CCA Prox  103     16                                          +----------+--------+--------+--------+------------------+--------+  CCA Mid   86      19                                           +----------+--------+--------+--------+------------------+--------+  CCA Distal80      19              homogeneous                 +----------+--------+--------+--------+------------------+--------+  ICA Prox  92      14      1-39%                               +----------+--------+--------+--------+------------------+--------+  ICA Mid   102     19                                          +----------+--------+--------+--------+------------------+--------+  ICA Distal74      17                                          +----------+--------+--------+--------+------------------+--------+  ECA      62      3                                           +----------+--------+--------+--------+------------------+--------+   +----------+--------+--------+----------------+-------------------+           PSV cm/sEDV cm/sDescribe        Arm Pressure (mmHG)  +----------+--------+--------+----------------+-------------------+  Subclavian170    4       Multiphasic, [id number]                  +----------+--------+--------+----------------+-------------------+   +---------+--------+--+--------+--+---------+  VertebralPSV cm/s46EDV cm/s10Antegrade  +---------+--------+--+--------+--+---------+      Summary:  Right Carotid: Velocities in the right ICA are consistent with a 40-59%                 stenosis.   Left Carotid: Velocities in the left ICA are consistent with a 1-39%  stenosis.   Vertebrals: Bilateral vertebral arteries demonstrate antegrade flow.  Subclavians: Normal flow hemodynamics were seen in bilateral subclavian               arteries.    Abdominal Aorta Findings:  +-----------+-------+----------+----------+--------+--------+--------+  Location  AP (cm)Trans (cm)PSV (cm/s)WaveformThrombusComments  +-----------+-------+----------+----------+--------+--------+--------+  Proximal  1.70   1.90      71        biphasic                   +-----------+-------+----------+----------+--------+--------+--------+  Mid       1.61   1.40      85        biphasic                  +-----------+-------+----------+----------+--------+--------+--------+  Distal    1.50   1.60      116       biphasic                  +-----------+-------+----------+----------+--------+--------+--------+  RT CIA Prox0.9    0.9       123       biphasic                  +-----------+-------+----------+----------+--------+--------+--------+  LT CIA Prox0.9    0.9       119       biphasic                  +-----------+-------+----------+----------+--------+--------+--------+         Summary:  Abdominal Aorta: No evidence of an abdominal aortic aneurysm was  visualized.      ASSESSMENT/PLAN: AAA surveillance after palpable aortic pulse found on exam..  No evidence of an abdominal aortic aneurysm was visualized. Normal sized AAA << 2.0 cm.    Carotid stenosis She is asymptomatic for stroke/TIA symptoms.  He carotid duplex shows a stable right Carotid.  The right ICA is uncharged and shows 40-59 % stenosis.  If she develops symptoms of stroke or TIA she will call 911.  I will schedule her a 1 year f/u with repeat duplex.  She will stay active, Eliquis for Afib and Repatha for cholesterol.          Roxy Horseman PA-C [facility] Office: [phone]  MD in clinic [place]

## 2022-07-18 DIAGNOSIS — Z780 Asymptomatic menopausal state: Secondary | ICD-10-CM | POA: Diagnosis not present

## 2022-07-18 DIAGNOSIS — Z6821 Body mass index (BMI) 21.0-21.9, adult: Secondary | ICD-10-CM | POA: Diagnosis not present

## 2022-07-18 DIAGNOSIS — Z13820 Encounter for screening for osteoporosis: Secondary | ICD-10-CM | POA: Diagnosis not present

## 2022-07-18 DIAGNOSIS — E559 Vitamin D deficiency, unspecified: Secondary | ICD-10-CM | POA: Diagnosis not present

## 2022-07-18 DIAGNOSIS — M1990 Unspecified osteoarthritis, unspecified site: Secondary | ICD-10-CM | POA: Diagnosis not present

## 2022-07-18 DIAGNOSIS — Z79899 Other long term (current) drug therapy: Secondary | ICD-10-CM | POA: Diagnosis not present

## 2022-07-18 DIAGNOSIS — K5190 Ulcerative colitis, unspecified, without complications: Secondary | ICD-10-CM | POA: Diagnosis not present

## 2022-07-18 DIAGNOSIS — I259 Chronic ischemic heart disease, unspecified: Secondary | ICD-10-CM | POA: Diagnosis not present

## 2022-07-18 DIAGNOSIS — Z0000 Encounter for general adult medical examination without abnormal findings: Secondary | ICD-10-CM | POA: Diagnosis not present

## 2022-09-13 ENCOUNTER — Telehealth: Payer: Self-pay

## 2022-09-13 NOTE — Telephone Encounter (Signed)
Left message on voicemail for patient to return call to schedule 1 year follow up for ulcerative colitis.  She is a patient of Dr Christella Hartigan.  Will continue efforts.

## 2022-09-15 NOTE — Telephone Encounter (Signed)
Left message on voicemail with information regarding follow up appointment.  Advised that patient would need a follow up appointment to receive future refills.  Advised that if refills are needed she would contact our office for an appointment.

## 2022-09-15 NOTE — Telephone Encounter (Signed)
Patient called stating that she does not feel the need to come in 1 year for a follow up due to [name] advising her she did not have to come in unless she is having problems. Please advise, thank you.

## 2022-10-09 ENCOUNTER — Other Ambulatory Visit: Payer: Self-pay

## 2022-10-09 ENCOUNTER — Emergency Department (HOSPITAL_COMMUNITY): Payer: PPO

## 2022-10-09 ENCOUNTER — Observation Stay (HOSPITAL_COMMUNITY)
Admission: EM | Admit: 2022-10-09 | Discharge: 2022-10-10 | Disposition: A | Discharge status: Home / Self Care | Payer: PPO | Attending: Cardiovascular Disease | Admitting: Cardiovascular Disease

## 2022-10-09 ENCOUNTER — Encounter (HOSPITAL_COMMUNITY): Payer: Self-pay | Admitting: Physician Assistant

## 2022-10-09 DIAGNOSIS — Z955 Presence of coronary angioplasty implant and graft: Secondary | ICD-10-CM | POA: Insufficient documentation

## 2022-10-09 DIAGNOSIS — R0602 Shortness of breath: Secondary | ICD-10-CM | POA: Diagnosis not present

## 2022-10-09 DIAGNOSIS — I129 Hypertensive chronic kidney disease with stage 1 through stage 4 chronic kidney disease, or unspecified chronic kidney disease: Secondary | ICD-10-CM | POA: Insufficient documentation

## 2022-10-09 DIAGNOSIS — Z7901 Long term (current) use of anticoagulants: Secondary | ICD-10-CM | POA: Diagnosis not present

## 2022-10-09 DIAGNOSIS — I700 Atherosclerosis of aorta: Secondary | ICD-10-CM | POA: Diagnosis not present

## 2022-10-09 DIAGNOSIS — N189 Chronic kidney disease, unspecified: Secondary | ICD-10-CM | POA: Diagnosis not present

## 2022-10-09 DIAGNOSIS — R079 Chest pain, unspecified: Secondary | ICD-10-CM | POA: Diagnosis not present

## 2022-10-09 DIAGNOSIS — I4892 Unspecified atrial flutter: Secondary | ICD-10-CM | POA: Diagnosis not present

## 2022-10-09 DIAGNOSIS — I130 Hypertensive heart and chronic kidney disease with heart failure and stage 1 through stage 4 chronic kidney disease, or unspecified chronic kidney disease: Secondary | ICD-10-CM | POA: Insufficient documentation

## 2022-10-09 DIAGNOSIS — R000 Tachycardia, unspecified: Secondary | ICD-10-CM | POA: Diagnosis not present

## 2022-10-09 DIAGNOSIS — R0789 Other chest pain: Secondary | ICD-10-CM | POA: Diagnosis not present

## 2022-10-09 DIAGNOSIS — I25118 Atherosclerotic heart disease of native coronary artery with other forms of angina pectoris: Secondary | ICD-10-CM | POA: Diagnosis not present

## 2022-10-09 DIAGNOSIS — I25119 Atherosclerotic heart disease of native coronary artery with unspecified angina pectoris: Secondary | ICD-10-CM | POA: Diagnosis present

## 2022-10-09 DIAGNOSIS — Z951 Presence of aortocoronary bypass graft: Secondary | ICD-10-CM | POA: Diagnosis not present

## 2022-10-09 DIAGNOSIS — I10 Essential (primary) hypertension: Secondary | ICD-10-CM | POA: Diagnosis present

## 2022-10-09 DIAGNOSIS — I4891 Unspecified atrial fibrillation: Secondary | ICD-10-CM | POA: Insufficient documentation

## 2022-10-09 DIAGNOSIS — E785 Hyperlipidemia, unspecified: Secondary | ICD-10-CM | POA: Diagnosis present

## 2022-10-09 HISTORY — DX: Unspecified atrial flutter: I48.92

## 2022-10-09 LAB — CBC WITH DIFFERENTIAL/PLATELET
Abs Immature Granulocytes: 0.02 10*3/uL (ref 0.00–0.07)
Basophils Absolute: 0 10*3/uL (ref 0.0–0.1)
Basophils Relative: 0 %
Eosinophils Absolute: 0.1 10*3/uL (ref 0.0–0.5)
Eosinophils Relative: 2 %
HCT: 51.4 % — ABNORMAL HIGH (ref 36.0–46.0)
Hemoglobin: 16.2 g/dL — ABNORMAL HIGH (ref 12.0–15.0)
Immature Granulocytes: 0 %
Lymphocytes Relative: 27 %
Lymphs Abs: 1.2 10*3/uL (ref 0.7–4.0)
MCH: 29 pg (ref 26.0–34.0)
MCHC: 31.5 g/dL (ref 30.0–36.0)
MCV: 92.1 fL (ref 80.0–100.0)
Monocytes Absolute: 0.3 10*3/uL (ref 0.1–1.0)
Monocytes Relative: 7 %
Neutro Abs: 2.9 10*3/uL (ref 1.7–7.7)
Neutrophils Relative %: 64 %
Platelets: 154 10*3/uL (ref 150–400)
RBC: 5.58 MIL/uL — ABNORMAL HIGH (ref 3.87–5.11)
RDW: 14.4 % (ref 11.5–15.5)
WBC: 4.7 10*3/uL (ref 4.0–10.5)
nRBC: 0 % (ref 0.0–0.2)

## 2022-10-09 LAB — PROTIME-INR
INR: 1.4 — ABNORMAL HIGH (ref 0.8–1.2)
Prothrombin Time: 17.1 seconds — ABNORMAL HIGH (ref 11.4–15.2)

## 2022-10-09 LAB — COMPREHENSIVE METABOLIC PANEL
ALT: 19 U/L (ref 0–44)
AST: 26 U/L (ref 15–41)
Albumin: 4.4 g/dL (ref 3.5–5.0)
Alkaline Phosphatase: 59 U/L (ref 38–126)
Anion gap: 14 (ref 5–15)
BUN: 17 mg/dL (ref 8–23)
CO2: 24 mmol/L (ref 22–32)
Calcium: 9.3 mg/dL (ref 8.9–10.3)
Chloride: 103 mmol/L (ref 98–111)
Creatinine, Ser: 1.1 mg/dL — ABNORMAL HIGH (ref 0.44–1.00)
GFR, Estimated: 51 mL/min — ABNORMAL LOW (ref >60)
Glucose, Bld: 103 mg/dL — ABNORMAL HIGH (ref 70–99)
Potassium: 4.3 mmol/L (ref 3.5–5.1)
Sodium: 141 mmol/L (ref 135–145)
Total Bilirubin: 1 mg/dL (ref 0.3–1.2)
Total Protein: 7.8 g/dL (ref 6.5–8.1)

## 2022-10-09 LAB — I-STAT CHEM 8, ED
BUN: 23 mg/dL (ref 8–23)
Calcium, Ion: 1.06 mmol/L — ABNORMAL LOW (ref 1.15–1.40)
Chloride: 106 mmol/L (ref 98–111)
Creatinine, Ser: 1 mg/dL (ref 0.44–1.00)
Glucose, Bld: 99 mg/dL (ref 70–99)
HCT: 50 % — ABNORMAL HIGH (ref 36.0–46.0)
Hemoglobin: 17 g/dL — ABNORMAL HIGH (ref 12.0–15.0)
Potassium: 4.6 mmol/L (ref 3.5–5.1)
Sodium: 141 mmol/L (ref 135–145)
TCO2: 28 mmol/L (ref 22–32)

## 2022-10-09 LAB — TROPONIN I (HIGH SENSITIVITY)
Troponin I (High Sensitivity): 13 ng/L (ref <18)
Troponin I (High Sensitivity): 13 ng/L (ref <18)

## 2022-10-09 LAB — BRAIN NATRIURETIC PEPTIDE: B Natriuretic Peptide: 288.2 pg/mL — ABNORMAL HIGH (ref 0.0–100.0)

## 2022-10-09 LAB — TSH: TSH: 3.623 u[IU]/mL (ref 0.350–4.500)

## 2022-10-09 MED ORDER — AMIODARONE HCL IN DEXTROSE 360-4.14 MG/200ML-% IV SOLN
60.0000 mg/h | INTRAVENOUS | Status: DC
  Administered 2022-10-09 (×2): 60 mg/h via INTRAVENOUS
  Filled 2022-10-09: qty 200

## 2022-10-09 MED ORDER — SODIUM CHLORIDE 0.9% FLUSH: 3.0000 mL | INTRAVENOUS | Status: DC | PRN

## 2022-10-09 MED ORDER — AMIODARONE HCL IN DEXTROSE 360-4.14 MG/200ML-% IV SOLN
30.0000 mg/h | INTRAVENOUS | Status: DC
  Administered 2022-10-10 (×2): 30 mg/h via INTRAVENOUS
  Filled 2022-10-09: qty 200

## 2022-10-09 MED ORDER — IOHEXOL 350 MG/ML SOLN
100.0000 mL | Freq: Once | INTRAVENOUS | Status: AC | PRN
  Administered 2022-10-09: 100 mL via INTRAVENOUS

## 2022-10-09 MED ORDER — SODIUM CHLORIDE 0.9% FLUSH: 3.0000 mL | Freq: Two times a day (BID) | INTRAVENOUS | Status: DC

## 2022-10-09 MED ORDER — LOSARTAN POTASSIUM 50 MG PO TABS
50.0000 mg | ORAL_TABLET | Freq: Every day | ORAL | Status: DC
  Administered 2022-10-10: 50 mg via ORAL
  Filled 2022-10-09: qty 1

## 2022-10-09 MED ORDER — AMIODARONE HCL IN DEXTROSE 360-4.14 MG/200ML-% IV SOLN
INTRAVENOUS | Status: AC
  Filled 2022-10-09: qty 200

## 2022-10-09 MED ORDER — ADULT MULTIVITAMIN W/MINERALS CH
1.0000 | ORAL_TABLET | Freq: Every day | ORAL | Status: DC
  Administered 2022-10-09 – 2022-10-10 (×2): 1 via ORAL
  Filled 2022-10-09 (×2): qty 1

## 2022-10-09 MED ORDER — SODIUM CHLORIDE 0.9 % IV SOLN: 250.0000 mL | INTRAVENOUS | Status: DC | PRN

## 2022-10-09 MED ORDER — AMIODARONE LOAD VIA INFUSION
150.0000 mg | Freq: Once | INTRAVENOUS | Status: AC
  Administered 2022-10-09: 150 mg via INTRAVENOUS
  Filled 2022-10-09: qty 83.34

## 2022-10-09 MED ORDER — CALCIUM CARB-CHOLECALCIFEROL 600-800 MG-UNIT PO TABS: 1.0000 | ORAL_TABLET | Freq: Every day | ORAL | Status: DC

## 2022-10-09 MED ORDER — APIXABAN 5 MG PO TABS
5.0000 mg | ORAL_TABLET | Freq: Two times a day (BID) | ORAL | Status: DC
  Administered 2022-10-09 – 2022-10-10 (×2): 5 mg via ORAL
  Filled 2022-10-09 (×2): qty 1

## 2022-10-09 MED ORDER — VITAMIN C 500 MG PO TABS
1000.0000 mg | ORAL_TABLET | Freq: Every day | ORAL | Status: DC
  Administered 2022-10-09 – 2022-10-10 (×2): 1000 mg via ORAL
  Filled 2022-10-09 (×2): qty 2

## 2022-10-09 MED ORDER — HYDROMORPHONE HCL 1 MG/ML IJ SOLN
0.5000 mg | Freq: Once | INTRAMUSCULAR | Status: AC
  Administered 2022-10-09: 0.5 mg via INTRAVENOUS
  Filled 2022-10-09: qty 1

## 2022-10-09 MED ORDER — ACETAMINOPHEN 325 MG PO TABS: 650.0000 mg | ORAL_TABLET | ORAL | Status: DC | PRN

## 2022-10-09 MED ORDER — ONDANSETRON HCL 4 MG/2ML IJ SOLN: 4.0000 mg | Freq: Four times a day (QID) | INTRAMUSCULAR | Status: DC | PRN

## 2022-10-09 NOTE — ED Notes (Addendum)
ED TO INPATIENT HANDOFF REPORT  ED Nurse Name and Phone #: Dessirae Scarola/ [phone]  S Name/Age/Gender [name] 82 y.o. female Room/Bed: 010C/010C  Code Status   Code Status: Full Code  Home/SNF/Other Home Patient oriented to: self, place, time, and situation Is this baseline? Yes   Triage Complete: Triage complete  Chief Complaint Atrial flutter with rapid ventricular response (HCC) [I48.92]  Triage Note Patient POV c/o rapid heart rate 115's with 7/10 chest pressure that radiates down to lower back. H/O MI, open heart surgery, Afib. Patient takes amiodarone and eliquis that she took this morning.    Allergies Allergies  Allergen Reactions   Apple Anaphylaxis   Nadolol Hives and Anaphylaxis    Rash and nausea   Other Other (See Comments) and Anaphylaxis    apples Apples-anaphylaxis   Sulfa Antibiotics Hives and Anaphylaxis   Sulfamethoxazole Anaphylaxis   Sulfonamide Derivatives Anaphylaxis   Verapamil Other (See Comments) and Anaphylaxis    seizures   Crestor [Rosuvastatin] Other (See Comments)    Myalgias    Flagyl [Metronidazole] Hives    Level of Care/Admitting Diagnosis ED Disposition     ED Disposition  Admit   Condition  --   Comment  Hospital Area: [facility] [[id number]]  Level of Care: Telemetry Cardiac [[id number]]  May place patient in observation at [facility] or [facility] if equivalent level of care is available:: No  Covid Evaluation: Asymptomatic - no recent exposure (last 10 days) testing not required  Diagnosis: Atrial flutter with rapid ventricular response [facility]) [[id number]]  Admitting Physician: Parke Poisson [[id number]]  Attending Physician: Parke Poisson [[id number]]          B Medical/Surgery History Past Medical History:  Diagnosis Date   Anemia    "in my early teens"   Arthritis    neck and back   Atrial fibrillation (HCC)    post-op after CABG;  treated with Amiodarone and coumadin until readmxn with  CDiff colitis, perf colon and elevated LFTs (amio and coumadin stopped)   Atrial flutter with rapid ventricular response (HCC) 10/09/2022   C. difficile colitis 12/2010   complicated post CABG course with prolonged admission, perforated sigmoid colon;  s/p sigmoid colectomy with end colostomy [name] procedure);  laparotomy wound infection with Pseudomonas treated with antibxs and secondary intention   Chronic kidney disease    hx kidney stones   Coronary artery disease    a. NSTEMI 7/12, cath: pLAD 30%, mLAD 60-70%, dLAD 80%, oOM2 50%, mOM2 40%, trifurcation 80%, pRCA 25%, then occluded, L-R collats, EF 55%;   b. 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 Cornelius Moras) // c. ETT 3/18: inf-lat ST depression of 1 mm at 4 min of exercise, frequent ectopy and hypertensive BP response // d. 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   GERD (gastroesophageal reflux disease)    H/O hiatal hernia    "slight; not having problems with it"   Hair loss    after heart surgery   Hemorrhoids    "none since hemorrhoid OR"   History of bronchitis    late 1980's   Hyperlipidemia    takes Pravastatin daily   Hypertension    takes Metoprolol daily   Mitral valve prolapse    was told this in the late 1990's; ;then with Dr.Mclean he states that she doesn't   Myocardial infarction (HCC) 11/2010   Palpitations  2003   hx of; Holter in 2003 with PACs and PVCs   Pericarditis 1980   hx of   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   DR.OWEN   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 in setting of CDiff colitis   Seizures (HCC)    "w/verapimil"   Surgical wound infection    Pseudomonas; wound VAC; secondary intention   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   Past Surgical History:  Procedure Laterality Date   ABDOMINAL HYSTERECTOMY     partial hysterectomy   APPENDECTOMY     as a child   BOTOX INJECTION  12/03/2015   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   CARDIAC CATHETERIZATION  11/21/10   CATARACT EXTRACTION, BILATERAL     CHOLECYSTECTOMY N/A 01/25/2016   Procedure: LAPAROSCOPIC CHOLECYSTECTOMY WITH INTRAOPERATIVE CHOLANGIOGRAM;  Surgeon: Gaynelle Adu, MD;  Location: [facility] ORS;  Service: General;  Laterality: N/A;   COLONOSCOPY     COLOSTOMY RECONNECT     colostomy reversal  06/27/11   CORONARY ANGIOPLASTY WITH STENT PLACEMENT     CORONARY ARTERY BYPASS GRAFT  11-25-2010   CABG X3; LIMA to LAD, SVG to PDA, SVG to OM, EVH via right thigh   ESOPHAGOGASTRODUODENOSCOPY     FLEXIBLE SIGMOIDOSCOPY  11/09/2011   Procedure: FLEXIBLE SIGMOIDOSCOPY;  Surgeon: Rachael Fee, MD;  Location: [facility] END[facility]COPY;  Service: Endoscopy;  Laterality: N/A;   HEMORRHOID SURGERY  ~ 2005   x 2   OOPHORECTOMY  2005   left; w/"mass removal"   PROCTOSCOPY  06/27/2011   Procedure: PROCTOSCOPY;  Surgeon: Atilano Ina, MD;  Location: [facility] OR;  Service: General;  Laterality: N/A;   SIGMOIDECTOMY  12/18/2010   with end colostomy   TUBAL LIGATION       A IV Location/Drains/Wounds Patient Lines/Drains/Airways Status     Active Line/Drains/Airways     Name Placement date Placement time Site Days   Peripheral IV 10/09/22 20 G Right Antecubital 10/09/22  1010  Antecubital  less than 1   Incision - 4 Ports Abdomen 1: Superior;Umbilicus 2: Mid;Superior 3: Right;Medial;Upper 4: Right;Lateral 01/25/16  --  -- 2449            Intake/Output Last 24 hours No intake or output data in the 24 hours ending 10/09/22 1745  Labs/Imaging Results for orders placed or performed during the hospital encounter of 10/09/22 (from the past 48 hour(s))  Comprehensive metabolic panel     Status: Abnormal   Collection Time: 10/09/22  9:57 AM  Result Value Ref Range   Sodium 141 135 - 145 mmol/L   Potassium 4.3 3.5 - 5.1 mmol/L   Chloride 103 98 - 111 mmol/L   CO2 24 22 - 32 mmol/L   Glucose, Bld 103 (H) 70 - 99 mg/dL    Comment: Glucose reference range applies only  to samples taken after fasting for at least 8 hours.   BUN 17 8 - 23 mg/dL   Creatinine, Ser 1.91 (H) 0.44 - 1.00 mg/dL   Calcium 9.3 8.9 - 47.8 mg/dL   Total Protein 7.8 6.5 - 8.1 g/dL   Albumin 4.4 3.5 - 5.0 g/dL   AST 26 15 - 41 U/L   ALT 19 0 - 44 U/L   Alkaline Phosphatase 59 38 - 126 U/L   Total Bilirubin 1.0 0.3 - 1.2 mg/dL   GFR, Estimated 51 (L) >60 mL/min    Comment: (NOTE) Calculated using the CKD-EPI Creatinine Equation (  2021)    Anion gap 14 5 - 15    Comment: Performed at [facility], [address], [place], [place] [address]  CBC with Differential     Status: Abnormal   Collection Time: 10/09/22  9:57 AM  Result Value Ref Range   WBC 4.7 4.0 - 10.5 K/uL   RBC 5.58 (H) 3.87 - 5.11 MIL/uL   Hemoglobin 16.2 (H) 12.0 - 15.0 g/dL   HCT 60.4 (H) 54.0 - 98.1 %   MCV 92.1 80.0 - 100.0 fL   MCH 29.0 26.0 - 34.0 pg   MCHC 31.5 30.0 - 36.0 g/dL   RDW 19.1 47.8 - 29.5 %   Platelets 154 150 - 400 K/uL   nRBC 0.0 0.0 - 0.2 %   Neutrophils Relative % 64 %   Neutro Abs 2.9 1.7 - 7.7 K/uL   Lymphocytes Relative 27 %   Lymphs Abs 1.2 0.7 - 4.0 K/uL   Monocytes Relative 7 %   Monocytes Absolute 0.3 0.1 - 1.0 K/uL   Eosinophils Relative 2 %   Eosinophils Absolute 0.1 0.0 - 0.5 K/uL   Basophils Relative 0 %   Basophils Absolute 0.0 0.0 - 0.1 K/uL   Immature Granulocytes 0 %   Abs Immature Granulocytes 0.02 0.00 - 0.07 K/uL    Comment: Performed at [facility], [address] [place], [place] [address]  Protime-INR     Status: Abnormal   Collection Time: 10/09/22  9:57 AM  Result Value Ref Range   Prothrombin Time 17.1 (H) 11.4 - 15.2 seconds   INR 1.4 (H) 0.8 - 1.2    Comment: (NOTE) INR goal varies based on device and disease states. Performed at [facility], [address], [place], [place] [address]   Troponin I (High Sensitivity)     Status: None   Collection Time: 10/09/22  9:57 AM  Result Value Ref Range   Troponin I (High Sensitivity) 13  <18 ng/L    Comment: (NOTE) Elevated high sensitivity troponin I (hsTnI) values and significant  changes across serial measurements may suggest ACS but many other  chronic and acute conditions are known to elevate hsTnI results.  Refer to the "Links" section for chest pain algorithms and additional  guidance. Performed at [facility], [address] [place], [place] [address]   Brain natriuretic peptide     Status: Abnormal   Collection Time: 10/09/22  9:57 AM  Result Value Ref Range   B Natriuretic Peptide 288.2 (H) 0.0 - 100.0 pg/mL    Comment: Performed at [facility], [address], [place], [place] [address]  I-stat chem 8, ED     Status: Abnormal   Collection Time: 10/09/22 10:11 AM  Result Value Ref Range   Sodium 141 135 - 145 mmol/L   Potassium 4.6 3.5 - 5.1 mmol/L   Chloride 106 98 - 111 mmol/L   BUN 23 8 - 23 mg/dL   Creatinine, Ser 1.32 0.44 - 1.00 mg/dL   Glucose, Bld 99 70 - 99 mg/dL    Comment: Glucose reference range applies only to samples taken after fasting for at least 8 hours.   Calcium, Ion 1.06 (L) 1.15 - 1.40 mmol/L   TCO2 28 22 - 32 mmol/L   Hemoglobin 17.0 (H) 12.0 - 15.0 g/dL   HCT 44.0 (H) 10.2 - 72.5 %  Troponin I (High Sensitivity)     Status: None   Collection  Time: 10/09/22 11:55 AM  Result Value Ref Range   Troponin I (High Sensitivity) 13 <18 ng/L    Comment: (NOTE) Elevated high sensitivity troponin I (hsTnI) values and significant  changes across serial measurements may suggest ACS but many other  chronic and acute conditions are known to elevate hsTnI results.  Refer to the "Links" section for chest pain algorithms and additional  guidance. Performed at [facility], [address], [place], [place] [address]    CT Angio Chest/Abd/Pel for Dissection W and/or W/WO  Result Date: 10/09/2022 CLINICAL DATA:  Chest pressure radiating to the back.  Tachycardia. EXAM: CT ANGIOGRAPHY CHEST, ABDOMEN AND PELVIS TECHNIQUE:  Non-contrast CT of the chest was initially obtained. Multidetector CT imaging through the chest, abdomen and pelvis was performed using the standard protocol during bolus administration of intravenous contrast. Multiplanar reconstructed images and MIPs were obtained and reviewed to evaluate the vascular anatomy. RADIATION DOSE REDUCTION: This exam was performed according to the departmental dose-optimization program which includes automated exposure control, adjustment of the mA and/or kV according to patient size and/or use of iterative reconstruction technique. CONTRAST:  OMNIPAQUE IOHEXOL 350 MG/ML SOLN COMPARISON:  Chest x-ray from same day. CT chest, abdomen, and pelvis dated January 31, 2021. FINDINGS: CTA CHEST FINDINGS Cardiovascular: Preferential opacification of the thoracic aorta. No evidence of thoracic aortic aneurysm or dissection. Coronary, aortic arch, and branch vessel atherosclerotic vascular disease. Borderline cardiomegaly status post CABG. No pericardial effusion. No pulmonary embolism. Mediastinum/Nodes: No enlarged mediastinal, hilar, or axillary lymph nodes. Thyroid gland, trachea, and esophagus demonstrate no significant findings. Lungs/Pleura: Unchanged mild centrilobular tree-in-bud nodularity in the anterior right upper lobe with small areas of focal bronchiolectasis and mucoid impaction. No focal consolidation, pleural effusion, or pneumothorax. Similar minimal scarring in the medial right middle lobe and lingula. Musculoskeletal: No chest wall abnormality. No acute or significant osseous findings. Review of the MIP images confirms the above findings. CTA ABDOMEN AND PELVIS FINDINGS VASCULAR Aorta: Normal caliber aorta without aneurysm, dissection, vasculitis or significant stenosis. Atherosclerotic calcification. 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 Otherwise patent without aneurysm, dissection, or vasculitis. SMA: Patent without evidence of  aneurysm, dissection, vasculitis or significant stenosis. Renals: Unchanged mild-to-moderate stenosis of the proximal right renal artery. Both renal arteries are patent without evidence of aneurysm, dissection, vasculitis, or fibromuscular dysplasia. IMA: Patent without evidence of aneurysm, dissection, vasculitis or significant stenosis. Inflow: Patent without evidence of aneurysm, dissection, vasculitis or significant stenosis. Veins: No obvious venous abnormality within the limitations of this arterial phase study. Review of the MIP images confirms the above findings. NON-VASCULAR Hepatobiliary: No focal liver abnormality is seen. Status post cholecystectomy. No biliary dilatation. Pancreas: Unremarkable. No pancreatic ductal dilatation or surrounding inflammatory changes. Spleen: Normal in size without focal abnormality. Adrenals/Urinary Tract: Adrenal glands are unremarkable. Kidneys are normal, without renal calculi, focal lesion, or hydronephrosis. Bladder is unremarkable. Stomach/Bowel: Stomach is within normal limits. History of prior appendectomy. Postsurgical changes from prior sigmoid colon resection. No evidence of bowel wall thickening, distention, or inflammatory changes. Lymphatic: No enlarged abdominal or pelvic lymph nodes. Reproductive: Uterus and bilateral adnexa are unremarkable. Other: Unchanged diastasis of the lower anterior abdominal wall containing non-dilated loops of small bowel. No free fluid or pneumoperitoneum. Musculoskeletal: No acute or significant osseous findings. Review of the MIP images confirms the above findings. IMPRESSION: VASCULAR: 1. No evidence of aortic aneurysm or dissection. 2. 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 3. 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 4.  Aortic atherosclerosis (ICD10-I70.0). CHEST: 1. No acute intrathoracic process. 2. Unchanged mild centrilobular tree-in-bud nodularity in the  anterior right upper lobe with small areas of focal bronchiolectasis and mucoid impaction, consistent with chronic atypical infection. ABDOMEN AND PELVIS: 1. No acute intra-abdominal process. Electronically Signed   By: Obie Dredge M.D.   On: 10/09/2022 11:36   DG Chest Port 1 View  Result Date: 10/09/2022 CLINICAL DATA:  Shortness of breath. EXAM: PORTABLE CHEST 1 VIEW COMPARISON:  Chest x-ray dated June 23, 2013. FINDINGS: Heart is at the upper limits of normal in size status post CABG. Normal pulmonary vascularity. No focal consolidation, pleural effusion, or pneumothorax. No acute osseous abnormality. IMPRESSION: No active disease. Electronically Signed   By: Obie Dredge M.D.   On: 10/09/2022 11:03    Pending Labs [employer] (From admission, onward)     Start     Ordered   Signed and Held  TSH  Once,   R        Signed and Held            Vitals/Pain Today's Vitals   10/09/22 1615 10/09/22 1630 10/09/22 1645 10/09/22 1700  BP: (!) 147/97 (!) 146/115 (!) 146/96 134/87  Pulse: (!) 111 (!) 114 (!) 113 (!) 118  Resp: 20 (!) 35 (!) 22 13  Temp:      TempSrc:      SpO2: 97% 98% 100% 99%  PainSc:        Isolation Precautions No active isolations  Medications Medications  HYDROmorphone (DILAUDID) injection 0.5 mg (0.5 mg Intravenous Given 10/09/22 1024)  iohexol (OMNIPAQUE) 350 MG/ML injection 100 mL (100 mLs Intravenous Contrast Given 10/09/22 1044)    Mobility walks with person assist     Focused Assessments     R Recommendations: See Admitting Provider Note  Report given to:   Additional Notes: Pt came in for CP. Pt has hx of A-fib and takes eliquis and amiodarone at home. Pt is A&Ox 4 and ambulatory. Pt has a BNP of 288.2. Cardiology has already been down to see her.

## 2022-10-09 NOTE — H&P (Addendum)
Cardiology Admission History and Physical   Patient ID: [name] MRN: [id number]; DOB: [birth year]   Admission date: 10/09/2022  PCP:  Dominique Saupe, MD   [facility] Providers Cardiologist:  Dominique Swazil[place], MD       Chief Complaint:  chest pain   Patient Profile:   [name] is a 82 y.o. female with a hx of coronary artery disease s/p NSTEMI followed by CABG in 11/2010 (post op colon perf s/p sigmoid colectomy), s/p 2.75 x 12 mm DES to pLCx in 12/2019 (unstable angina; bypass grafts patent - L-LAD, S-OM, S-PDA), paroxysmal atrial fibrillation/flutter, Amiodarone Rx, sinus node dysfunction/bradycardia (improved on lower dose Amiodarone), diastolic dysfunction, ulcerative colitis, spasmodic dysphonia, carotid artery disease ([place] 07/2022: RICA 40-59; LICA 1-39), hypertension, hyperlipidemia (statin intol; Rx w [id number]) who is being seen 10/09/2022 for the evaluation of atrial flutter at the request of Dr. Suezanne Terry.  History of Present Illness:   [name] was last seen by Dr. Swaziland in 03/2022 and Dr. Ladona Ridgel in 10/2021. She has been kept on low dose Amiodarone at 50 mg once daily due to bradycardia. When seen last by Dr. Ladona Ridgel it was noted that she may need back up pacing at some time in the future. TTE 12/25/19 ([facility]): EF 60-65, mildly reduced RVSF, mild RAE, trace AI, mod TR, RVSP 28.13.  Myoview 10/07/2020 demonstrated no ischemia or infarction, EF 62.  She presented to the [facility] ED today with chest pain.  Her heart rate is typically in the upper 40s to low 50s.  She usually walks on a daily basis.  She has recently been feeling well.  Yesterday, she noted shortness of breath and some chest tightness with walking.  This morning, she noted that her heart rate was much higher (1 teens).  She noted shortness of breath as well as chest discomfort.  She has not had syncope, orthopnea, leg edema.  In the emergency room, her potassium is normal  at 4.6, creatinine normal at 1.0, ALT normal at 19, hemoglobin elevated at 17, BNP elevated at 288.2, troponins negative x 2 (13-13).  Chest x-ray demonstrated no acute disease. EKG #1 demonstrated probable atrial flutter with 2:1 conduction, HR 115, QTc 428 EKG #2 demonstrated atrial flutter, HR 116, 2:1 conduction Chest/abdomen/pelvic CTA demonstrated no evidence of aneurysm or dissection.  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   Past Medical History:  Diagnosis Date   Anemia    "in my early teens"   Arthritis    neck and back   Atrial fibrillation (HCC)    post-op after CABG;  treated with Amiodarone and coumadin until readmxn with CDiff colitis, perf colon and elevated LFTs (amio and coumadin stopped)   C. difficile colitis 12/2010   complicated post CABG course with prolonged admission, perforated sigmoid colon;  s/p sigmoid colectomy with end colostomy [name] procedure);  laparotomy wound infection with Pseudomonas treated with antibxs and secondary intention   Chronic kidney disease    hx kidney stones   Coronary artery disease    a. NSTEMI 7/12, cath: pLAD 30%, mLAD 60-70%, dLAD 80%, oOM2 50%, mOM2 40%, trifurcation 80%, pRCA 25%, then occluded, L-R collats, EF 55%;   b. 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 Cornelius Moras) // c. ETT 3/18: inf-lat ST depression of 1 mm at 4 min of exercise, frequent  ectopy and hypertensive BP response // d. 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   GERD (gastroesophageal reflux disease)    H/O hiatal hernia    "slight; not having problems with it"   Hair loss    after heart surgery   Hemorrhoids    "none since hemorrhoid OR"   History of bronchitis    late 1980's   Hyperlipidemia    takes Pravastatin daily   Hypertension    takes Metoprolol daily    Mitral valve prolapse    was told this in the late 1990's; ;then with Dr.Mclean he states that she doesn't   Myocardial infarction (HCC) 11/2010   Palpitations 2003   hx of; Holter in 2003 with PACs and PVCs   Pericarditis 1980   hx of   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   DR.OWEN   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 in setting of CDiff colitis   Seizures (HCC)    "w/verapimil"   Surgical wound infection    Pseudomonas; wound VAC; secondary intention   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    Past Surgical History:  Procedure Laterality Date   ABDOMINAL HYSTERECTOMY     partial hysterectomy   APPENDECTOMY     as a child   BOTOX INJECTION  12/03/2015   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   CARDIAC CATHETERIZATION  11/21/10   CATARACT EXTRACTION, BILATERAL     CHOLECYSTECTOMY N/A 01/25/2016   Procedure: LAPAROSCOPIC CHOLECYSTECTOMY WITH INTRAOPERATIVE CHOLANGIOGRAM;  Surgeon: Gaynelle Adu, MD;  Location: [facility] ORS;  Service: General;  Laterality: N/A;   COLONOSCOPY     COLOSTOMY RECONNECT     colostomy reversal  06/27/11   CORONARY ANGIOPLASTY WITH STENT PLACEMENT     CORONARY ARTERY BYPASS GRAFT  11-25-2010   CABG X3; LIMA to LAD, SVG to PDA, SVG to OM, EVH via right thigh   ESOPHAGOGASTRODUODENOSCOPY     FLEXIBLE SIGMOIDOSCOPY  11/09/2011   Procedure: FLEXIBLE SIGMOIDOSCOPY;  Surgeon: Rachael Fee, MD;  Location: [facility] ENDOSCOPY;  Service: Endoscopy;  Laterality: N/A;   HEMORRHOID SURGERY  ~ 2005   x 2   OOPHORECTOMY  2005   left; w/"mass removal"   PROCTOSCOPY  06/27/2011   Procedure: PROCTOSCOPY;  Surgeon: Atilano Ina, MD;  Location: [facility];  Service: General;  Laterality: N/A;   SIGMOIDECTOMY  12/18/2010   with end colostomy   TUBAL LIGATION       Medications Prior to Admission: Prior to Admission medications   Medication Sig Start Date End Date Taking? Authorizing Provider  amiodarone (PACERONE) 100 MG tablet Take 1/2 (one-half) tablet by mouth once  daily Patient taking differently: Take 50 mg by mouth daily. 07/03/22  Yes Terry, Dominique M, MD  apixaban (ELIQUIS) 5 MG TABS tablet Take 1 tablet by mouth twice daily Patient taking differently: Take 5 mg by mouth 2 (two) times daily. 05/03/22  Yes Terry, Dominique M, MD  ascorbic acid (VITAMIN C) 1000 MG tablet Take 1,000 mg by mouth daily.   Yes [provider]  Bacillus Coagulans-Inulin (PROBIOTIC-PREBIOTIC) 1-250 BILLION-MG CAPS Take 1 capsule by mouth daily.   Yes [provider]  Calcium Carb-Cholecalciferol 600-800 MG-UNIT TABS Take 1 tablet by mouth daily.   Yes [provider]  Evolocumab (REPATHA SURECLICK) 140 MG/ML SOAJ INJECT 140 MG SUBCUTANEOUSLY EVERY 14 DAYS Patient taking differently: Inject 140  mg into the skin every 14 (fourteen) days. 12/08/21  Yes Terry, Dominique M, MD  losartan (COZAAR) 50 MG tablet Take 1 tablet by mouth once daily Patient taking differently: Take 50 mg by mouth daily. 03/27/22  Yes Terry, Dominique M, MD  Multiple Vitamin (MULTI-VITAMIN) tablet Take 1 tablet by mouth daily.   Yes [provider]     Allergies:    Allergies  Allergen Reactions   Apple Anaphylaxis   Nadolol Hives and Anaphylaxis    Rash and nausea   Other Other (See Comments) and Anaphylaxis    apples Apples-anaphylaxis   Sulfa Antibiotics Hives and Anaphylaxis   Sulfamethoxazole Anaphylaxis   Sulfonamide Derivatives Anaphylaxis   Verapamil Other (See Comments) and Anaphylaxis    seizures   Crestor [Rosuvastatin] Other (See Comments)    Myalgias    Flagyl [Metronidazole] Hives   Social History:   Social History   Socioeconomic History   Marital status: Married    Spouse name: Not on file   Number of children: 1   Years of education: Not on file   Highest education level: Not on file  Occupational History   Occupation: LPN    Employer: [employer]  Tobacco Use   Smoking status: Never    Passive exposure: Never   Smokeless tobacco:  Never  Vaping Use   Vaping Use: Never used  Substance and Sexual Activity   Alcohol use: No   Drug use: No   Sexual activity: Yes    Birth control/protection: Post-menopausal  Other Topics Concern   Not on file  Social History Narrative   Right handed   Lives with husband one story home   Social Determinants of Health   Financial Resource Strain: Not on file  Food Insecurity: Not on file  Transportation Needs: Not on file  Physical Activity: Not on file  Stress: Not on file  Social Connections: Not on file  Intimate Partner Violence: Not on file    Family History:   The patient's family history includes Coronary artery disease in her mother; Heart attack (age of onset: 49) in her father; Heart disease in her father and mother. There is no history of Anesthesia problems, Hypotension, Malignant hyperthermia, Pseudochol deficiency, Colon cancer, Esophageal cancer, Stomach cancer, or Rectal cancer.    ROS:  Please see the history of present illness.  No fever, chills, cough, melena, hematochezia, hematuria, vomiting, diarrhea. All other ROS reviewed and negative.     Physical Exam/Data:   Vitals:   10/09/22 1230 10/09/22 1245 10/09/22 1315 10/09/22 1332  BP: 125/82 122/77 118/84   Pulse: (!) 117 (!) 114 (!) 111   Resp:   19   Temp:    98 F (36.7 C)  TempSrc:      SpO2: 99% 96% 99%    No intake or output data in the 24 hours ending 10/09/22 1645    03/24/2022    9:52 AM 11/09/2021    2:07 PM 11/07/2021   10:20 AM  Last 3 Weights  Weight (lbs) 132 lb 9.6 oz 135 lb 137 lb  Weight (kg) 60.147 kg 61.236 kg 62.143 kg     There is no height or weight on file to calculate BMI.  General:  Well nourished, well developed, in no acute distress  HEENT: normal Neck: no  JVD Vascular:  Distal pulses 2+ bilaterally   Cardiac:  rapid regular rhythm, no murmur  Lungs:  clear to auscultation bilaterally, no wheezing, rhonchi or rales  Abd: soft, nontender, no hepatomegaly  Ext:  no  edema Musculoskeletal:  No deformities  Skin: warm and dry  Neuro:  CNs 2-12 intact, no focal abnormalities noted Psych:  Normal affect   EKG:  See HPI  Relevant CV Studies:   Laboratory Data:  High Sensitivity Troponin:   Recent Labs  Lab 10/09/22 0957 10/09/22 1155  TROPONINIHS 13 13      Chemistry Recent Labs  Lab 10/09/22 0957 10/09/22 1011  NA 141 141  K 4.3 4.6  CL 103 106  CO2 24  --   GLUCOSE 103* 99  BUN 17 23  CREATININE 1.10* 1.00  CALCIUM 9.3  --   GFRNONAA 51*  --   ANIONGAP 14  --     Recent Labs  Lab 10/09/22 0957  PROT 7.8  ALBUMIN 4.4  AST 26  ALT 19  ALKPHOS 59  BILITOT 1.0   Lipids No results for input(s): "CHOL", "TRIG", "HDL", "LABVLDL", "LDLCALC", "CHOLHDL" in the last 168 hours. Hematology Recent Labs  Lab 10/09/22 0957 10/09/22 1011  WBC 4.7  --   RBC 5.58*  --   HGB 16.2* 17.0*  HCT 51.4* 50.0*  MCV 92.1  --   MCH 29.0  --   MCHC 31.5  --   RDW 14.4  --   PLT 154  --    Thyroid No results for input(s): "TSH", "FREET4" in the last 168 hours. BNP Recent Labs  Lab 10/09/22 0957  BNP 288.2*    DDimer No results for input(s): "DDIMER" in the last 168 hours.   Radiology/Studies:  CT Angio Chest/Abd/Pel for Dissection W and/or W/WO  Result Date: 10/09/2022 CLINICAL DATA:  Chest pressure radiating to the back.  Tachycardia. EXAM: CT ANGIOGRAPHY CHEST, ABDOMEN AND PELVIS TECHNIQUE: Non-contrast CT of the chest was initially obtained. Multidetector CT imaging through the chest, abdomen and pelvis was performed using the standard protocol during bolus administration of intravenous contrast. Multiplanar reconstructed images and MIPs were obtained and reviewed to evaluate the vascular anatomy. RADIATION DOSE REDUCTION: This exam was performed according to the departmental dose-optimization program which includes automated exposure control, adjustment of the mA and/or kV according to patient size and/or use of iterative  reconstruction technique. CONTRAST:  OMNIPAQUE IOHEXOL 350 MG/ML SOLN COMPARISON:  Chest x-ray from same day. CT chest, abdomen, and pelvis dated January 31, 2021. FINDINGS: CTA CHEST FINDINGS Cardiovascular: Preferential opacification of the thoracic aorta. No evidence of thoracic aortic aneurysm or dissection. Coronary, aortic arch, and branch vessel atherosclerotic vascular disease. Borderline cardiomegaly status post CABG. No pericardial effusion. No pulmonary embolism. Mediastinum/Nodes: No enlarged mediastinal, hilar, or axillary lymph nodes. Thyroid gland, trachea, and esophagus demonstrate no significant findings. Lungs/Pleura: Unchanged mild centrilobular tree-in-bud nodularity in the anterior right upper lobe with small areas of focal bronchiolectasis and mucoid impaction. No focal consolidation, pleural effusion, or pneumothorax. Similar minimal scarring in the medial right middle lobe and lingula. Musculoskeletal: No chest wall abnormality. No acute or significant osseous findings. Review of the MIP images confirms the above findings. CTA ABDOMEN AND PELVIS FINDINGS VASCULAR Aorta: Normal caliber aorta without aneurysm, dissection, vasculitis or significant stenosis. Atherosclerotic calcification. 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 Otherwise patent without aneurysm, dissection, or vasculitis. SMA: Patent without evidence of aneurysm, dissection, vasculitis or significant stenosis. Renals: Unchanged mild-to-moderate stenosis of the proximal right renal artery. Both renal arteries are patent without evidence of aneurysm, dissection, vasculitis, or fibromuscular dysplasia. IMA: Patent without evidence of aneurysm, dissection,  vasculitis or significant stenosis. Inflow: Patent without evidence of aneurysm, dissection, vasculitis or significant stenosis. Veins: No obvious venous abnormality within the limitations of this arterial phase study. Review of the MIP images  confirms the above findings. NON-VASCULAR Hepatobiliary: No focal liver abnormality is seen. Status post cholecystectomy. No biliary dilatation. Pancreas: Unremarkable. No pancreatic ductal dilatation or surrounding inflammatory changes. Spleen: Normal in size without focal abnormality. Adrenals/Urinary Tract: Adrenal glands are unremarkable. Kidneys are normal, without renal calculi, focal lesion, or hydronephrosis. Bladder is unremarkable. Stomach/Bowel: Stomach is within normal limits. History of prior appendectomy. Postsurgical changes from prior sigmoid colon resection. No evidence of bowel wall thickening, distention, or inflammatory changes. Lymphatic: No enlarged abdominal or pelvic lymph nodes. Reproductive: Uterus and bilateral adnexa are unremarkable. Other: Unchanged diastasis of the lower anterior abdominal wall containing non-dilated loops of small bowel. No free fluid or pneumoperitoneum. Musculoskeletal: No acute or significant osseous findings. Review of the MIP images confirms the above findings. IMPRESSION: VASCULAR: 1. No evidence of aortic aneurysm or dissection. 2. 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 3. Unchanged mild-to-moderate stenosis of the proximal right renal artery. 4.  Aortic atherosclerosis (ICD10-I70.0). CHEST: 1. No acute intrathoracic process. 2. Unchanged mild centrilobular tree-in-bud nodularity in the anterior right upper lobe with small areas of focal bronchiolectasis and mucoid impaction, consistent with chronic atypical infection. ABDOMEN AND PELVIS: 1. No acute intra-abdominal process. Electronically Signed   By: Obie Dredge M.D.   On: 10/09/2022 11:36   DG Chest Port 1 View  Result Date: 10/09/2022 CLINICAL DATA:  Shortness of breath. EXAM: PORTABLE CHEST 1 VIEW COMPARISON:  Chest x-ray dated June 23, 2013. FINDINGS: Heart is at the upper limits of normal in size status post CABG. Normal pulmonary vascularity. No focal  consolidation, pleural effusion, or pneumothorax. No acute osseous abnormality. IMPRESSION: No active disease. Electronically Signed   By: Obie Dredge M.D.   On: 10/09/2022 11:03     Assessment and Plan:   1.  Atrial flutter with rapid ventricular rate She presents in symptomatic atrial flutter with rapid rate.  She has had chest discomfort associated with this.  Her troponins are normal.  She has not missed any doses of her Eliquis.  She remains on low-dose amiodarone.  We discussed benefits of proceeding with cardioversion.  However, she prefers to try medication first. Continue Eliquis 5 mg twice daily Hold oral amiodarone Start IV amiodarone If she does not convert, consider cardioversion Obtain TSH  2.  Coronary artery disease History of CABG in 2012 and DES to the LCx in 2021.  She has had chest discomfort and shortness of breath.  This is all likely related to atrial fibrillation.  Her normal troponins are reassuring.  She is not on antiplatelet therapy as she is on Eliquis.  Obtain echocardiogram.  3.  Hyperlipidemia Excellent control with most recent LDL 39 in May 2023.  Continue Repatha 140 mg every 2 weeks.  4.  Carotid artery disease She is followed by vascular surgery.  5.  Sinus node dysfunction She has been followed peripherally by Dr. Ladona Ridgel with the EP.  If she has worsening bradycardia, she may require pacing.   Risk Assessment/Risk Scores:      CHA2DS2-VASc Score = 5   This indicates a 7.2% annual risk of stroke. The patient's score is based upon: CHF History: 0 HTN History: 1 Diabetes History: 0 Stroke History: 0 Vascular Disease History: 1 Age Score: 2 Gender Score: 1  Severity of Illness: The appropriate patient status for this patient is OBSERVATION. Observation status is judged to be reasonable and necessary in order to provide the required intensity of service to ensure the patient's safety. The patient's presenting symptoms, physical exam  findings, and initial radiographic and laboratory data in the context of their medical condition is felt to place them at decreased risk for further clinical deterioration. Furthermore, it is anticipated that the patient will be medically stable for discharge from the hospital within 2 midnights of admission.    For questions or updates, please contact [facility] Please consult [email or web address] for contact info under     Signed, Tereso Newcomer, PA-C  10/09/2022 4:45 PM    Patient seen and examined with Tereso Newcomer, PA-C.  Agree as above, with the following exceptions and changes as noted below.  82 year old female with recurrent atrial arrhythmia presenting with atrial flutter that is symptomatic with chest pressure, normal troponins, and no inciting event that she can identify.  She was in her usual state of health but noted that her pulse was elevated this morning which is unusual for her given that it is usually in the 40s or 50s on evaluation.  She did experience chest pain, troponins negative and EKG demonstrating atrial flutter with a rate of approximately 115 beats a minute.  Patient declined ED cardioversion by EDP.  Gen: NAD, CV: Irregular and tachycardic, no murmurs, Lungs: clear, Abd: soft, Extrem: Warm, well perfused, no edema, Neuro/Psych: alert and oriented x 3, normal mood and affect, vocal weakness. All available labs, radiology testing, previous records reviewed.  Agree with plan as noted above, seems reasonable to initiate IV amiodarone to reload and potentially chemically cardiovert.  Patient's husband and daughter ensure that she has not missed any doses of anticoagulation, however the patient states that perhaps occasionally she has missed the evening dose but it is so rare she cannot remember when.  We discussed the risks and benefits of using IV amiodarone in the setting of flutter which can sometimes be stubborn to rate control and convert.  She agrees with initiating IV  amiodarone given that she currently takes it at 50 mg daily.  We will check TSH to ensure no change in medical history contributing.  Appears euvolemic.  Blood pressure mildly elevated but patient's family states that at times her blood pressure in the emergency department is also reading normal.  She takes losartan at home and did take her dose this morning.  May need to consider additional antihypertensive therapy if blood pressure does not lower with IV amiodarone to a reasonable level.  We will obtain an echocardiogram given that her last one was several years ago to ensure no new structural heart disease.  Parke Poisson, MD 10/09/22 5:13 PM

## 2022-10-09 NOTE — ED Provider Notes (Signed)
4:29 PM Assumed care of patient from off-going team. For more details, please see note from same day.  In brief, this is a 82 y.o. female w/ CP/tachycardia. CT dissection neg, trop ruled out. HR steady at ~115 bpm in flutter type. Allergic to verapamil so cannot get diltiazem. Offered DCCV by offgoing EDP but patient prefers to speak with a cardiologist.  Plan/Dispo at time of sign-out & ED Course since sign-out: [ ]  cards  BP 118/84   Pulse (!) 111   Temp 98 F (36.7 C)   Resp 19   SpO2 99%    ED Course:   Clinical Course as of 10/09/22 1629  Mon Oct 09, 2022  1205 EKG 12-Lead [OZ]  1551 Workup overall reassuring including negative troponin x 2.  Negative dissection study.  Patient reports her symptoms have significantly improved.  She remains in likely a flutter, with a pretty constant rate at 115.  Discussed with Dr. Charlynne Pander with cardiology, they will consult.  Offered cardioversion to the patient but she prefers to wait for cardiology recommendations. [WS]  1618 Signed out pending cardiology consult.  [WS]  [phone] Cardiology discussed with patient who does not want DCCV. Patient will be admitted to cards service for further management. [HN]    Clinical Course User Index [HN] Loetta Rough, MD [OZ] Smitty Knudsen, PA-C [WS] Lonell Grandchild, MD    Dispo: Admit to cards. ------------------------------- Vivi Barrack, MD Emergency Medicine  This note was created using dictation software, which may contain spelling or grammatical errors.   Loetta Rough, MD 10/09/22 [phone]

## 2022-10-09 NOTE — ED Provider Notes (Signed)
[place] EMERGENCY DEPARTMENT AT [facility] Provider Note  CSN: [id number] Arrival date & time: 10/09/22 4098  Chief Complaint(s) Chest Pain and Tachycardia  HPI [name] is a 82 y.o. female with history of coronary artery disease, hypertension, hyperlipidemia, A-fib on Eliquis presenting to the emergency department with chest pain.  Patient reports lower chest pain radiating to the back.  She reports associated shortness of breath.  No nausea or vomiting.  She reports that she had pain intermittently yesterday and this morning became constant.  No leg swelling.  Reports compliance with her home medications.  No fevers or chills.  No cough.   Past Medical History Past Medical History:  Diagnosis Date  . Anemia    "in my early teens"  . Arthritis    neck and back  . Atrial fibrillation (HCC)    post-op after CABG;  treated with Amiodarone and coumadin until readmxn with CDiff colitis, perf colon and elevated LFTs (amio and coumadin stopped)  . C. difficile colitis 12/2010   complicated post CABG course with prolonged admission, perforated sigmoid colon;  s/p sigmoid colectomy with end colostomy [name] procedure);  laparotomy wound infection with Pseudomonas treated with antibxs and secondary intention  . Chronic kidney disease    hx kidney stones  . Coronary artery disease    a. NSTEMI 7/12, cath: pLAD 30%, mLAD 60-70%, dLAD 80%, oOM2 50%, mOM2 40%, trifurcation 80%, pRCA 25%, then occluded, L-R collats, EF 55%;   b. 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 Cornelius Moras) // c. ETT 3/18: inf-lat ST depression of 1 mm at 4 min of exercise, frequent ectopy and hypertensive BP response // d. Myoview 4/18: EF 68, no ischemia; low risk   . 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 GERD (gastroesophageal reflux disease)   . H/O hiatal hernia    "slight; not having problems with it"  . Hair loss    after heart surgery  . Hemorrhoids    "none since  hemorrhoid OR"  . History of bronchitis    late 1980's  . Hyperlipidemia    takes Pravastatin daily  . Hypertension    takes Metoprolol daily  . Mitral valve prolapse    was told this in the late 1990's; ;then with Dr.Mclean he states that she doesn't  . Myocardial infarction (HCC) 11/2010  . Palpitations 2003   hx of; Holter in 2003 with PACs and PVCs  . Pericarditis 1980   hx of  . 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 in setting of CDiff colitis  . Seizures (HCC)    "w/verapimil"  . Surgical wound infection    Pseudomonas; wound VAC; secondary intention  . 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 Bradycardia 06/16/2021  . Unstable angina (HCC) 12/24/2019  . 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 Bilateral carotid artery stenosis 07/07/2017  . 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 Palpitations 01/04/2017  . Carotid artery disease (HCC) 07/05/2016  . Laryngeal spasm 03/10/2015  . Dyspnea 05/23/2013  . Ulcerative colitis (HCC) 03/10/2013  . 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 Atrial fibrillation (HCC) 02/14/2011  . Coronary artery disease involving native coronary artery of native heart without angina pectoris 01/19/2011  . Essential hypertension 01/19/2011  . 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 Hyperlipidemia LDL goal <70 08/02/2009  .  ATRIAL PREMATURE BEATS 08/02/2009  . CHEST PAIN UNSPECIFIED 08/02/2009   Home Medication(s) Prior to Admission medications   Medication Sig Start Date End Date Taking? Authorizing Provider  amiodarone (PACERONE) 100 MG tablet Take 1/2 (one-half) tablet by mouth once daily Patient taking differently: Take 50 mg by mouth daily. 07/03/22  Yes [place], Peter M, MD  apixaban (ELIQUIS) 5 MG TABS tablet Take 1 tablet by mouth twice daily Patient taking differently: Take 5 mg by mouth 2 (two) times daily. 05/03/22  Yes [place], Peter M, MD  ascorbic acid (VITAMIN C)  1000 MG tablet Take 1,000 mg by mouth daily.   Yes [provider]  Bacillus Coagulans-Inulin (PROBIOTIC-PREBIOTIC) 1-250 BILLION-MG CAPS Take 1 capsule by mouth daily.   Yes [provider]  Calcium Carb-Cholecalciferol 600-800 MG-UNIT TABS Take 1 tablet by mouth daily.   Yes [provider]  Evolocumab (REPATHA SURECLICK) 140 MG/ML SOAJ INJECT 140 MG SUBCUTANEOUSLY EVERY 14 DAYS Patient taking differently: Inject 140 mg into the skin every 14 (fourteen) days. 12/08/21  Yes [place], Peter M, MD  losartan (COZAAR) 50 MG tablet Take 1 tablet by mouth once daily Patient taking differently: Take 50 mg by mouth daily. 03/27/22  Yes [place], Peter M, MD  Multiple Vitamin (MULTI-VITAMIN) tablet Take 1 tablet by mouth daily.   Yes [provider]                                                                                                                                    Past Surgical History Past Surgical History:  Procedure Laterality Date  . ABDOMINAL HYSTERECTOMY     partial hysterectomy  . APPENDECTOMY     as a child  . BOTOX INJECTION  12/03/2015   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  . CARDIAC CATHETERIZATION  11/21/10  . CATARACT EXTRACTION, BILATERAL    . CHOLECYSTECTOMY N/A 01/25/2016   Procedure: LAPAROSCOPIC CHOLECYSTECTOMY WITH INTRAOPERATIVE CHOLANGIOGRAM;  Surgeon: Gaynelle Adu, MD;  Location: [facility] ORS;  Service: General;  Laterality: N/A;  . COLONOSCOPY    . COLOSTOMY RECONNECT    . colostomy reversal  06/27/11  . CORONARY ANGIOPLASTY WITH STENT PLACEMENT    . CORONARY ARTERY BYPASS GRAFT  11-25-2010   CABG X3; LIMA to LAD, SVG to PDA, SVG to OM, EVH via right thigh  . ESOPHAGOGASTRODUODENOSCOPY    . FLEXIBLE SIGMOIDOSCOPY  11/09/2011   Procedure: FLEXIBLE SIGMOIDOSCOPY;  Surgeon: Rachael Fee, MD;  Location: [facility] ENDOSCOPY;  Service: Endoscopy;  Laterality: N/A;  . HEMORRHOID SURGERY  ~ 2005   x 2  . OOPHORECTOMY  2005    left; w/"mass removal"  . PROCTOSCOPY  06/27/2011   Procedure: PROCTOSCOPY;  Surgeon: Atilano Ina, MD;  Location: [facility];  Service: General;  Laterality: N/A;  . SIGMOIDECTOMY  12/18/2010   with end colostomy  . TUBAL LIGATION     Family History Family  History  Problem Relation Age of Onset  . Heart attack Father 59  . Heart disease Father        massive heart attack  . Coronary artery disease Mother        [place]  . Heart disease Mother   . Anesthesia problems Neg Hx   . Hypotension Neg Hx   . Malignant hyperthermia Neg Hx   . Pseudochol deficiency Neg Hx   . Colon cancer Neg Hx   . Esophageal cancer Neg Hx   . Stomach cancer Neg Hx   . Rectal cancer Neg Hx     Social History Social History   Tobacco Use  . Smoking status: Never    Passive exposure: Never  . Smokeless tobacco: Never  Vaping Use  . Vaping Use: Never used  Substance Use Topics  . Alcohol use: No  . Drug use: No   Allergies Apple, Nadolol, Other, Sulfa antibiotics, Sulfamethoxazole, Sulfonamide derivatives, Verapamil, Crestor [rosuvastatin], and Flagyl [metronidazole]  Review of Systems Review of Systems  All other systems reviewed and are negative.   Physical Exam Vital Signs  I have reviewed the triage vital signs BP 118/84   Pulse (!) 111   Temp 98 F (36.7 C)   Resp 19   SpO2 99%  Physical Exam Vitals and nursing note reviewed.  Constitutional:      General: She is in acute distress.     Appearance: She is well-developed.  HENT:     Head: Normocephalic and atraumatic.     Mouth/Throat:     Mouth: Mucous membranes are moist.  Eyes:     Pupils: Pupils are equal, round, and reactive to light.  Cardiovascular:     Rate and Rhythm: Regular rhythm. Tachycardia present.     Pulses:          Radial pulses are 2+ on the right side and 2+ on the left side.     Heart sounds: No murmur heard. Pulmonary:     Effort: Pulmonary effort is normal. Tachypnea present. No  respiratory distress.     Breath sounds: Normal breath sounds.  Abdominal:     General: Abdomen is flat.     Palpations: Abdomen is soft.     Tenderness: There is no abdominal tenderness.  Musculoskeletal:        General: No tenderness.     Right lower leg: No edema.     Left lower leg: No edema.  Skin:    General: Skin is warm and dry.  Neurological:     General: No focal deficit present.     Mental Status: She is alert. Mental status is at baseline.  Psychiatric:        Mood and Affect: Mood normal.        Behavior: Behavior normal.     ED Results and Treatments Labs (all labs ordered are listed, but only abnormal results are displayed) Labs Reviewed  COMPREHENSIVE METABOLIC PANEL - Abnormal; Notable for the following components:      Result Value   Glucose, Bld 103 (*)    Creatinine, Ser 1.10 (*)    GFR, Estimated 51 (*)    All other components within normal limits  CBC WITH DIFFERENTIAL/PLATELET - Abnormal; Notable for the following components:   RBC 5.58 (*)    Hemoglobin 16.2 (*)    HCT 51.4 (*)    All other components within normal limits  PROTIME-INR - Abnormal; Notable for the following  components:   Prothrombin Time 17.1 (*)    INR 1.4 (*)    All other components within normal limits  BRAIN NATRIURETIC PEPTIDE - Abnormal; Notable for the following components:   B Natriuretic Peptide 288.2 (*)    All other components within normal limits  I-STAT CHEM 8, ED - Abnormal; Notable for the following components:   Calcium, Ion 1.06 (*)    Hemoglobin 17.0 (*)    HCT 50.0 (*)    All other components within normal limits  TROPONIN I (HIGH SENSITIVITY)  TROPONIN I (HIGH SENSITIVITY)                                                                                                                          Radiology CT Angio Chest/Abd/Pel for Dissection W and/or W/WO  Result Date: 10/09/2022 CLINICAL DATA:  Chest pressure radiating to the back.  Tachycardia. EXAM: CT  ANGIOGRAPHY CHEST, ABDOMEN AND PELVIS TECHNIQUE: Non-contrast CT of the chest was initially obtained. Multidetector CT imaging through the chest, abdomen and pelvis was performed using the standard protocol during bolus administration of intravenous contrast. Multiplanar reconstructed images and MIPs were obtained and reviewed to evaluate the vascular anatomy. RADIATION DOSE REDUCTION: This exam was performed according to the departmental dose-optimization program which includes automated exposure control, adjustment of the mA and/or kV according to patient size and/or use of iterative reconstruction technique. CONTRAST:  OMNIPAQUE IOHEXOL 350 MG/ML SOLN COMPARISON:  Chest x-ray from same day. CT chest, abdomen, and pelvis dated January 31, 2021. FINDINGS: CTA CHEST FINDINGS Cardiovascular: Preferential opacification of the thoracic aorta. No evidence of thoracic aortic aneurysm or dissection. Coronary, aortic arch, and branch vessel atherosclerotic vascular disease. Borderline cardiomegaly status post CABG. No pericardial effusion. No pulmonary embolism. Mediastinum/Nodes: No enlarged mediastinal, hilar, or axillary lymph nodes. Thyroid gland, trachea, and esophagus demonstrate no significant findings. Lungs/Pleura: Unchanged mild centrilobular tree-in-bud nodularity in the anterior right upper lobe with small areas of focal bronchiolectasis and mucoid impaction. No focal consolidation, pleural effusion, or pneumothorax. Similar minimal scarring in the medial right middle lobe and lingula. Musculoskeletal: No chest wall abnormality. No acute or significant osseous findings. Review of the MIP images confirms the above findings. CTA ABDOMEN AND PELVIS FINDINGS VASCULAR Aorta: Normal caliber aorta without aneurysm, dissection, vasculitis or significant stenosis. Atherosclerotic calcification. 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 Otherwise patent without aneurysm, dissection, or  vasculitis. SMA: Patent without evidence of aneurysm, dissection, vasculitis or significant stenosis. Renals: Unchanged mild-to-moderate stenosis of the proximal right renal artery. Both renal arteries are patent without evidence of aneurysm, dissection, vasculitis, or fibromuscular dysplasia. IMA: Patent without evidence of aneurysm, dissection, vasculitis or significant stenosis. Inflow: Patent without evidence of aneurysm, dissection, vasculitis or significant stenosis. Veins: No obvious venous abnormality within the limitations of this arterial phase study. Review of the MIP images confirms the above findings. NON-VASCULAR Hepatobiliary: No focal liver abnormality is seen. Status post cholecystectomy. No biliary dilatation. Pancreas: Unremarkable.  No pancreatic ductal dilatation or surrounding inflammatory changes. Spleen: Normal in size without focal abnormality. Adrenals/Urinary Tract: Adrenal glands are unremarkable. Kidneys are normal, without renal calculi, focal lesion, or hydronephrosis. Bladder is unremarkable. Stomach/Bowel: Stomach is within normal limits. History of prior appendectomy. Postsurgical changes from prior sigmoid colon resection. No evidence of bowel wall thickening, distention, or inflammatory changes. Lymphatic: No enlarged abdominal or pelvic lymph nodes. Reproductive: Uterus and bilateral adnexa are unremarkable. Other: Unchanged diastasis of the lower anterior abdominal wall containing non-dilated loops of small bowel. No free fluid or pneumoperitoneum. Musculoskeletal: No acute or significant osseous findings. Review of the MIP images confirms the above findings. IMPRESSION: VASCULAR: 1. No evidence of aortic aneurysm or dissection. 2. 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 3. Unchanged mild-to-moderate stenosis of the proximal right renal artery. 4.  Aortic atherosclerosis (ICD10-I70.0). CHEST: 1. No acute intrathoracic process. 2. Unchanged mild  centrilobular tree-in-bud nodularity in the anterior right upper lobe with small areas of focal bronchiolectasis and mucoid impaction, consistent with chronic atypical infection. ABDOMEN AND PELVIS: 1. No acute intra-abdominal process. Electronically Signed   By: Obie Dredge M.D.   On: 10/09/2022 11:36   DG Chest Port 1 View  Result Date: 10/09/2022 CLINICAL DATA:  Shortness of breath. EXAM: PORTABLE CHEST 1 VIEW COMPARISON:  Chest x-ray dated June 23, 2013. FINDINGS: Heart is at the upper limits of normal in size status post CABG. Normal pulmonary vascularity. No focal consolidation, pleural effusion, or pneumothorax. No acute osseous abnormality. IMPRESSION: No active disease. Electronically Signed   By: Obie Dredge M.D.   On: 10/09/2022 11:03    Pertinent labs & imaging results that were available during my care of the patient were reviewed by me and considered in my medical decision making (see MDM for details).  Medications Ordered in ED Medications  HYDROmorphone (DILAUDID) injection 0.5 mg (0.5 mg Intravenous Given 10/09/22 1024)  iohexol (OMNIPAQUE) 350 MG/ML injection 100 mL (100 mLs Intravenous Contrast Given 10/09/22 1044)                                                                                                                                     Procedures Procedures  (including critical care time)  Medical Decision Making / ED Course   MDM:  82 year old female presenting to the emergency department with chest pain.  Patient uncomfortable appearing, EKG appears to show possible atrial flutter.  Also noted to be hypoxic to 85% on room air.  Lungs clear on exam and no peripheral edema.  Differential includes ACS, dissection, effect of tachyarrhythmia, less likely pneumothorax, pneumonia.  Doubt pulmonary embolism as patient is compliant with Eliquis.  Given patient appearing uncomfortable will activate code medical for expedited CT scan.  Also obtain labs  including troponin.  ECG without STEMI.    Clinical Course as of 10/09/22 1618  Mon Oct 09, 2022  1205 EKG 12-Lead [OZ]  1551 Workup overall reassuring including negative troponin x 2.  Negative dissection study.  Patient reports her symptoms have significantly improved.  She remains in likely a flutter, with a pretty constant rate at 115.  Discussed with Dr. Charlynne Pander with cardiology, they will consult.  Offered cardioversion to the patient but she prefers to wait for cardiology recommendations. [WS]  1618 Signed out pending cardiology consult.  [WS]    Clinical Course User Index [OZ] Smitty Knudsen, PA-C [WS] Lonell Grandchild, MD     Additional history obtained: -Additional history obtained from family -External records from outside source obtained and reviewed including: Chart review including previous notes, labs, imaging, consultation notes including prior cardiology notes   Lab Tests: -I ordered, reviewed, and interpreted labs.   The pertinent results include:   Labs Reviewed  COMPREHENSIVE METABOLIC PANEL - Abnormal; Notable for the following components:      Result Value   Glucose, Bld 103 (*)    Creatinine, Ser 1.10 (*)    GFR, Estimated 51 (*)    All other components within normal limits  CBC WITH DIFFERENTIAL/PLATELET - Abnormal; Notable for the following components:   RBC 5.58 (*)    Hemoglobin 16.2 (*)    HCT 51.4 (*)    All other components within normal limits  PROTIME-INR - Abnormal; Notable for the following components:   Prothrombin Time 17.1 (*)    INR 1.4 (*)    All other components within normal limits  BRAIN NATRIURETIC PEPTIDE - Abnormal; Notable for the following components:   B Natriuretic Peptide 288.2 (*)    All other components within normal limits  I-STAT CHEM 8, ED - Abnormal; Notable for the following components:   Calcium, Ion 1.06 (*)    Hemoglobin 17.0 (*)    HCT 50.0 (*)    All other components within normal limits  TROPONIN I (HIGH  SENSITIVITY)  TROPONIN I (HIGH SENSITIVITY)    Notable for normal troponin x2. Mildly elevated BNP  EKG   EKG Interpretation  Date/Time:  Monday Oct 09 2022 10:25:34 EDT Ventricular Rate:  116 PR Interval:  174 QRS Duration: 88 QT Interval:  299 QTC Calculation: 416 R Axis:   102 Text Interpretation: Atrial flutter Right axis deviation Confirmed by Alvino Blood ([id number]) on 10/09/2022 10:29:20 AM         Imaging Studies ordered: I ordered imaging studies including CT dissection protocol On my interpretation imaging demonstrates no dissection I independently visualized and interpreted imaging. I agree with the radiologist interpretation   Medicines ordered and prescription drug management: Meds ordered this encounter  Medications  . HYDROmorphone (DILAUDID) injection 0.5 mg  . iohexol (OMNIPAQUE) 350 MG/ML injection 100 mL    -I have reviewed the patients home medicines and have made adjustments as needed   Consultations Obtained: I requested consultation with the cardiologist,  and discussed lab and imaging findings as well as pertinent plan - they recommend: they will come and see the patient   Cardiac Monitoring: The patient was maintained on a cardiac monitor.  I personally viewed and interpreted the cardiac monitored which showed an underlying rhythm of: atrial flutter   Reevaluation: After the interventions noted above, I reevaluated the patient and found that their symptoms have improved  Co morbidities that complicate the patient evaluation . Past Medical History:  Diagnosis Date  . Anemia    "in my early teens"  . Arthritis    neck and back  . Atrial fibrillation (HCC)  post-op after CABG;  treated with Amiodarone and coumadin until readmxn with CDiff colitis, perf colon and elevated LFTs (amio and coumadin stopped)  . C. difficile colitis 12/2010   complicated post CABG course with prolonged admission, perforated sigmoid colon;  s/p sigmoid  colectomy with end colostomy [name] procedure);  laparotomy wound infection with Pseudomonas treated with antibxs and secondary intention  . Chronic kidney disease    hx kidney stones  . Coronary artery disease    a. NSTEMI 7/12, cath: pLAD 30%, mLAD 60-70%, dLAD 80%, oOM2 50%, mOM2 40%, trifurcation 80%, pRCA 25%, then occluded, L-R collats, EF 55%;   b. 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 Cornelius Moras) // c. ETT 3/18: inf-lat ST depression of 1 mm at 4 min of exercise, frequent ectopy and hypertensive BP response // d. Myoview 4/18: EF 68, no ischemia; low risk   . 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 GERD (gastroesophageal reflux disease)   . H/O hiatal hernia    "slight; not having problems with it"  . Hair loss    after heart surgery  . Hemorrhoids    "none since hemorrhoid OR"  . History of bronchitis    late 1980's  . Hyperlipidemia    takes Pravastatin daily  . Hypertension    takes Metoprolol daily  . Mitral valve prolapse    was told this in the late 1990's; ;then with Dr.Mclean he states that she doesn't  . Myocardial infarction (HCC) 11/2010  . Palpitations 2003   hx of; Holter in 2003 with PACs and PVCs  . Pericarditis 1980   hx of  . 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 in setting of CDiff colitis  . Seizures (HCC)    "w/verapimil"  . Surgical wound infection    Pseudomonas; wound VAC; secondary intention  . 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    Final Clinical Impression(s) / ED Diagnoses Final diagnoses:  Atrial flutter, unspecified type (HCC)  Chest pain, unspecified type     This chart was dictated using voice recognition software.  Despite best efforts to proofread,  errors can occur which can change the documentation meaning.    Lonell Grandchild, MD 10/09/22 [phone]

## 2022-10-09 NOTE — ED Notes (Signed)
Pt was able to walk to restroom and back w/out assistance at this time

## 2022-10-09 NOTE — ED Notes (Signed)
Patient SPO2 @ 85%RA placed on 2liters O2 100%. MD made aware

## 2022-10-09 NOTE — ED Triage Notes (Signed)
Patient POV c/o rapid heart rate 115's with 7/10 chest pressure that radiates down to lower back. H/O MI, open heart surgery, Afib. Patient takes amiodarone and eliquis that she took this morning.

## 2022-10-09 NOTE — ED Notes (Signed)
Stocked rooms 1-12 as needed

## 2022-10-10 ENCOUNTER — Observation Stay (HOSPITAL_COMMUNITY): Payer: PPO | Admitting: Anesthesiology

## 2022-10-10 ENCOUNTER — Observation Stay (HOSPITAL_BASED_OUTPATIENT_CLINIC_OR_DEPARTMENT_OTHER): Payer: PPO | Admitting: Anesthesiology

## 2022-10-10 ENCOUNTER — Observation Stay (HOSPITAL_BASED_OUTPATIENT_CLINIC_OR_DEPARTMENT_OTHER): Payer: PPO

## 2022-10-10 ENCOUNTER — Encounter (HOSPITAL_COMMUNITY)
Admission: EM | Disposition: A | Discharge status: Home / Self Care | Payer: Self-pay | Source: Home / Self Care | Attending: Emergency Medicine

## 2022-10-10 DIAGNOSIS — K449 Diaphragmatic hernia without obstruction or gangrene: Secondary | ICD-10-CM

## 2022-10-10 DIAGNOSIS — I2510 Atherosclerotic heart disease of native coronary artery without angina pectoris: Secondary | ICD-10-CM

## 2022-10-10 DIAGNOSIS — N189 Chronic kidney disease, unspecified: Secondary | ICD-10-CM | POA: Diagnosis not present

## 2022-10-10 DIAGNOSIS — I4891 Unspecified atrial fibrillation: Secondary | ICD-10-CM | POA: Diagnosis not present

## 2022-10-10 DIAGNOSIS — R079 Chest pain, unspecified: Secondary | ICD-10-CM

## 2022-10-10 DIAGNOSIS — I10 Essential (primary) hypertension: Secondary | ICD-10-CM | POA: Diagnosis not present

## 2022-10-10 DIAGNOSIS — I129 Hypertensive chronic kidney disease with stage 1 through stage 4 chronic kidney disease, or unspecified chronic kidney disease: Secondary | ICD-10-CM | POA: Diagnosis not present

## 2022-10-10 DIAGNOSIS — Z951 Presence of aortocoronary bypass graft: Secondary | ICD-10-CM | POA: Diagnosis not present

## 2022-10-10 DIAGNOSIS — Z955 Presence of coronary angioplasty implant and graft: Secondary | ICD-10-CM | POA: Diagnosis not present

## 2022-10-10 DIAGNOSIS — I4892 Unspecified atrial flutter: Secondary | ICD-10-CM | POA: Diagnosis not present

## 2022-10-10 DIAGNOSIS — Z7901 Long term (current) use of anticoagulants: Secondary | ICD-10-CM | POA: Diagnosis not present

## 2022-10-10 DIAGNOSIS — I252 Old myocardial infarction: Secondary | ICD-10-CM | POA: Diagnosis not present

## 2022-10-10 DIAGNOSIS — I25118 Atherosclerotic heart disease of native coronary artery with other forms of angina pectoris: Secondary | ICD-10-CM | POA: Diagnosis not present

## 2022-10-10 HISTORY — PX: CARDIOVERSION: SHX1299

## 2022-10-10 LAB — ECHOCARDIOGRAM COMPLETE
Area-P 1/2: 3.12 cm2
S' Lateral: 2.8 cm
Weight: 2096 oz

## 2022-10-10 LAB — PROTIME-INR
INR: 1.2 (ref 0.8–1.2)
Prothrombin Time: 15.6 seconds — ABNORMAL HIGH (ref 11.4–15.2)

## 2022-10-10 SURGERY — CARDIOVERSION
Anesthesia: General

## 2022-10-10 MED ORDER — AMIODARONE HCL 100 MG PO TABS: 100.0000 mg | ORAL_TABLET | Freq: Every day | ORAL | 2 refills | Status: DC

## 2022-10-10 MED ORDER — LIDOCAINE 2% (20 MG/ML) 5 ML SYRINGE
INTRAMUSCULAR | Status: DC | PRN
  Administered 2022-10-10: 60 mg via INTRAVENOUS

## 2022-10-10 MED ORDER — ORAL CARE MOUTH RINSE: 15.0000 mL | OROMUCOSAL | Status: DC | PRN

## 2022-10-10 MED ORDER — SODIUM CHLORIDE 0.9 % IV SOLN: INTRAVENOUS | Status: DC

## 2022-10-10 MED ORDER — PROPOFOL 10 MG/ML IV BOLUS
INTRAVENOUS | Status: DC | PRN
  Administered 2022-10-10: 660 mg via INTRAVENOUS

## 2022-10-10 SURGICAL SUPPLY — 1 items: ELECT DEFIB PAD ADLT CADENCE (PAD) ×1 IMPLANT

## 2022-10-10 NOTE — Progress Notes (Signed)
Echocardiogram 2D Echocardiogram has been performed.  Warren Lacy Shourya Macpherson RDCS 10/10/2022, 12:50 PM

## 2022-10-10 NOTE — CV Procedure (Signed)
Procedure: Electrical Cardioversion Indications:  Atrial Flutter  Procedure Details:  Consent: Risks of procedure as well as the alternatives and risks of each were explained to the (patient/caregiver).  Consent for procedure obtained.  Time Out: Verified patient identification, verified procedure, site/side was marked, verified correct patient position, special equipment/implants available, medications/allergies/relevent history reviewed, required imaging and test results available. PERFORMED.  Patient placed on cardiac monitor, pulse oximetry, supplemental oxygen as necessary.  Sedation given:  propofol per anesthesia Pacer pads placed anterior and posterior chest.  Cardioverted 1 time(s).  Cardioversion with synchronized biphasic 120J shock.  Evaluation: Findings: Post procedure EKG shows:  sinus bradycardia Complications: None Patient did tolerate procedure well.  Time Spent Directly with the Patient:  30 minutes   Parke Poisson 10/10/2022, 11:04 AM

## 2022-10-10 NOTE — Interval H&P Note (Signed)
History and Physical Interval Note:  10/10/2022 10:52 AM  [name]  has presented today for surgery, with the diagnosis of afib.  The various methods of treatment have been discussed with the patient and family. After consideration of risks, benefits and other options for treatment, the patient has consented to  Procedure(s): CARDIOVERSION (N/A) as a surgical intervention.  The patient's history has been reviewed, patient examined, no change in status, stable for surgery.  I have reviewed the patient's chart and labs.  Questions were answered to the patient's satisfaction.    We reviewed consent for the procedure, which was discussed with patient by Dr. Royann Shivers and Perlie Gold PA-C. Risks, benefits and alternatives of direct current cardioversion reviewed including potential for post-cardioversion rhythms, especially life-threatening arrhythmias (ventricular tachycardia and fibrillation, profound bradycardia). Major complications may include serious or fatal arrhythmias, myocardial damage, and acute pulmonary edema; minor complications include skin burns and transient hypotension. Benefits include restoration of sinus rhythm. Alternatives to treatment were discussed, questions were answered. Patient is willing to proceed.   Parke Poisson

## 2022-10-10 NOTE — Transfer of Care (Signed)
Immediate Anesthesia Transfer of Care Note  Patient: [name]  Procedure(s) Performed: CARDIOVERSION  Patient Location: PACU and Cath Lab  Anesthesia Type:General  Level of Consciousness: awake and alert   Airway & Oxygen Therapy: Patient Spontanous Breathing and Patient connected to nasal cannula oxygen  Post-op Assessment: Report given to RN  Post vital signs: Reviewed and stable  Last Vitals:  Vitals Value Taken Time  BP 143/88 10/10/22 1055  Temp 36.6 C 10/10/22 1050  Pulse 103 10/10/22 1057  Resp 20 10/10/22 1057  SpO2 91 % 10/10/22 1057  Vitals shown include unvalidated device data.  Last Pain:  Vitals:   10/10/22 1050  TempSrc: Temporal  PainSc:          Complications: No notable events documented.

## 2022-10-10 NOTE — Progress Notes (Signed)
Rounding Note    Patient Name: [name] Date of Encounter: 10/10/2022  [facility] Cardiologist: Peter [place], MD   Subjective   No cardiovascular complaints, lying supine in bed without any discomfort. On IV amiodarone the rate of atrial flutter has decreased due to prolongation of the flutter cycle length, but remains in 2: 1 AV flutter with a ventricular rate of approximately 100 bpm.  Denies shortness of breath or chest pain.  Inpatient Medications    Scheduled Meds:  [MAR Hold] apixaban  5 mg Oral BID   [MAR Hold] ascorbic acid  1,000 mg Oral Daily   [MAR Hold] losartan  50 mg Oral Daily   [MAR Hold] multivitamin with minerals  1 tablet Oral Daily   [MAR Hold] sodium chloride flush  3 mL Intravenous Q12H   Continuous Infusions:  [MAR Hold] sodium chloride     sodium chloride     amiodarone 30 mg/hr (10/10/22 0439)   PRN Meds: [MAR Hold] sodium chloride, [MAR Hold] acetaminophen, [MAR Hold] ondansetron (ZOFRAN) IV, [MAR Hold] mouth rinse, [MAR Hold] sodium chloride flush   Vital Signs    Vitals:   10/09/22 1825 10/09/22 1827 10/10/22 0357 10/10/22 1014  BP:  (!) 135/90 (!) 141/85 (!) 118/98  Pulse:  (!) 114 (!) 103 (!) 107  Resp:  18 18 18   Temp: 98.4 F (36.9 C) 98.4 F (36.9 C) 98 F (36.7 C) 98.1 F (36.7 C)  TempSrc: Oral Oral Oral Oral  SpO2:  96% 94%   Weight:   59.4 kg    No intake or output data in the 24 hours ending 10/10/22 1052    10/10/2022    3:57 AM 03/24/2022    9:52 AM 11/09/2021    2:07 PM  Last 3 Weights  Weight (lbs) 131 lb 132 lb 9.6 oz 135 lb  Weight (kg) 59.421 kg 60.147 kg 61.236 kg      Telemetry    Atrial flutter with 2: 1 AV block- Personally Reviewed  ECG    Typical counterclockwise right atrial flutter- Personally Reviewed  Physical Exam  Slender. GEN: No acute distress.  Has mild spastic dysphonia. Neck: No JVD Cardiac: RRR, no murmurs, rubs, or gallops.  Respiratory: Clear to auscultation  bilaterally. GI: Soft, nontender, non-distended  MS: No edema; No deformity. Neuro:  Nonfocal  Psych: Normal affect   Labs    High Sensitivity Troponin:   Recent Labs  Lab 10/09/22 0957 10/09/22 1155  TROPONINIHS 13 13     Chemistry Recent Labs  Lab 10/09/22 0957 10/09/22 1011  NA 141 141  K 4.3 4.6  CL 103 106  CO2 24  --   GLUCOSE 103* 99  BUN 17 23  CREATININE 1.10* 1.00  CALCIUM 9.3  --   PROT 7.8  --   ALBUMIN 4.4  --   AST 26  --   ALT 19  --   ALKPHOS 59  --   BILITOT 1.0  --   GFRNONAA 51*  --   ANIONGAP 14  --     Lipids No results for input(s): "CHOL", "TRIG", "HDL", "LABVLDL", "LDLCALC", "CHOLHDL" in the last 168 hours.  Hematology Recent Labs  Lab 10/09/22 0957 10/09/22 1011  WBC 4.7  --   RBC 5.58*  --   HGB 16.2* 17.0*  HCT 51.4* 50.0*  MCV 92.1  --   MCH 29.0  --   MCHC 31.5  --   RDW 14.4  --  PLT 154  --    Thyroid  Recent Labs  Lab 10/09/22 1917  TSH 3.623    BNP Recent Labs  Lab 10/09/22 0957  BNP 288.2*    DDimer No results for input(s): "DDIMER" in the last 168 hours.   Radiology    CT Angio Chest/Abd/Pel for Dissection W and/or W/WO  Result Date: 10/09/2022 CLINICAL DATA:  Chest pressure radiating to the back.  Tachycardia. EXAM: CT ANGIOGRAPHY CHEST, ABDOMEN AND PELVIS TECHNIQUE: Non-contrast CT of the chest was initially obtained. Multidetector CT imaging through the chest, abdomen and pelvis was performed using the standard protocol during bolus administration of intravenous contrast. Multiplanar reconstructed images and MIPs were obtained and reviewed to evaluate the vascular anatomy. RADIATION DOSE REDUCTION: This exam was performed according to the departmental dose-optimization program which includes automated exposure control, adjustment of the mA and/or kV according to patient size and/or use of iterative reconstruction technique. CONTRAST:  OMNIPAQUE IOHEXOL 350 MG/ML SOLN COMPARISON:  Chest x-ray from same  day. CT chest, abdomen, and pelvis dated January 31, 2021. FINDINGS: CTA CHEST FINDINGS Cardiovascular: Preferential opacification of the thoracic aorta. No evidence of thoracic aortic aneurysm or dissection. Coronary, aortic arch, and branch vessel atherosclerotic vascular disease. Borderline cardiomegaly status post CABG. No pericardial effusion. No pulmonary embolism. Mediastinum/Nodes: No enlarged mediastinal, hilar, or axillary lymph nodes. Thyroid gland, trachea, and esophagus demonstrate no significant findings. Lungs/Pleura: Unchanged mild centrilobular tree-in-bud nodularity in the anterior right upper lobe with small areas of focal bronchiolectasis and mucoid impaction. No focal consolidation, pleural effusion, or pneumothorax. Similar minimal scarring in the medial right middle lobe and lingula. Musculoskeletal: No chest wall abnormality. No acute or significant osseous findings. Review of the MIP images confirms the above findings. CTA ABDOMEN AND PELVIS FINDINGS VASCULAR Aorta: Normal caliber aorta without aneurysm, dissection, vasculitis or significant stenosis. Atherosclerotic calcification. 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 Otherwise patent without aneurysm, dissection, or vasculitis. SMA: Patent without evidence of aneurysm, dissection, vasculitis or significant stenosis. Renals: Unchanged mild-to-moderate stenosis of the proximal right renal artery. Both renal arteries are patent without evidence of aneurysm, dissection, vasculitis, or fibromuscular dysplasia. IMA: Patent without evidence of aneurysm, dissection, vasculitis or significant stenosis. Inflow: Patent without evidence of aneurysm, dissection, vasculitis or significant stenosis. Veins: No obvious venous abnormality within the limitations of this arterial phase study. Review of the MIP images confirms the above findings. NON-VASCULAR Hepatobiliary: No focal liver abnormality is seen. Status post  cholecystectomy. No biliary dilatation. Pancreas: Unremarkable. No pancreatic ductal dilatation or surrounding inflammatory changes. Spleen: Normal in size without focal abnormality. Adrenals/Urinary Tract: Adrenal glands are unremarkable. Kidneys are normal, without renal calculi, focal lesion, or hydronephrosis. Bladder is unremarkable. Stomach/Bowel: Stomach is within normal limits. History of prior appendectomy. Postsurgical changes from prior sigmoid colon resection. No evidence of bowel wall thickening, distention, or inflammatory changes. Lymphatic: No enlarged abdominal or pelvic lymph nodes. Reproductive: Uterus and bilateral adnexa are unremarkable. Other: Unchanged diastasis of the lower anterior abdominal wall containing non-dilated loops of small bowel. No free fluid or pneumoperitoneum. Musculoskeletal: No acute or significant osseous findings. Review of the MIP images confirms the above findings. IMPRESSION: VASCULAR: 1. No evidence of aortic aneurysm or dissection. 2. 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 3. Unchanged mild-to-moderate stenosis of the proximal right renal artery. 4.  Aortic atherosclerosis (ICD10-I70.0). CHEST: 1. No acute intrathoracic process. 2. Unchanged mild centrilobular tree-in-bud nodularity in the anterior right upper lobe with small areas  of focal bronchiolectasis and mucoid impaction, consistent with chronic atypical infection. ABDOMEN AND PELVIS: 1. No acute intra-abdominal process. Electronically Signed   By: Obie Dredge M.D.   On: 10/09/2022 11:36   DG Chest Port 1 View  Result Date: 10/09/2022 CLINICAL DATA:  Shortness of breath. EXAM: PORTABLE CHEST 1 VIEW COMPARISON:  Chest x-ray dated June 23, 2013. FINDINGS: Heart is at the upper limits of normal in size status post CABG. Normal pulmonary vascularity. No focal consolidation, pleural effusion, or pneumothorax. No acute osseous abnormality. IMPRESSION: No active disease.  Electronically Signed   By: Obie Dredge M.D.   On: 10/09/2022 11:03    Cardiac Studies   Echocardiogram 2020    1. The left ventricle has normal systolic function with an ejection  fraction of 60-65%. The cavity size was normal. Left ventricular diastolic  Doppler parameters are consistent with pseudonormalization Elevated left  atrial and left ventricular  end-diastolic pressures.   2. The right ventricle has normal systolic function. The cavity was  normal. There is no increase in right ventricular wall thickness. Right  ventricular systolic pressure is moderately elevated with an estimated  pressure of 43.7 mmHg.   3. The mitral valve is degenerative. Moderate thickening of the mitral  valve leaflets with moderate involvement of the chordae tendinae. Mild  calcification of the mitral valve leaflet.   4. The tricuspid valve is normal in structure.   5. The aortic valve is tricuspid Aortic valve regurgitation is mild by  color flow Doppler.   6. The pulmonic valve was normal in structure.   7. The inferior vena cava was dilated in size with <50% respiratory  variability.   8. The interatrial septum appears to be lipomatous.   Patient Profile     82 y.o. 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  Today she is agreeable to undergo direct-current cardioversion.  She has  been on long-term anticoagulation. Shared Decision Making/Informed Consent The risks (stroke, cardiac arrhythmias rarely resulting in the need for a temporary or permanent pacemaker, skin irritation or burns and complications associated with conscious sedation including aspiration, arrhythmia, respiratory failure and death), benefits (restoration of normal sinus rhythm) and alternatives of a direct current cardioversion were explained in detail to Ms. Kingry and she agrees to proceed.     For questions or updates, please contact  [facility] Please consult [email or web address] for contact info under        Signed, Thurmon Fair, MD  10/10/2022, 10:52 AM

## 2022-10-10 NOTE — Anesthesia Postprocedure Evaluation (Signed)
Anesthesia Post Note  Patient: [name]  Procedure(s) Performed: CARDIOVERSION     Patient location during evaluation: Cath Lab Anesthesia Type: General Level of consciousness: awake and alert Pain management: pain level controlled Vital Signs Assessment: post-procedure vital signs reviewed and stable Respiratory status: spontaneous breathing, nonlabored ventilation, respiratory function stable and patient connected to nasal cannula oxygen Cardiovascular status: blood pressure returned to baseline and stable Postop Assessment: no apparent nausea or vomiting Anesthetic complications: no   No notable events documented.  Last Vitals:  Vitals:   10/10/22 1130 10/10/22 1157  BP: (!) 130/59 (!) 136/98  Pulse: (!) 49 (!) 57  Resp: 11 13  Temp:  36.8 C  SpO2: 95% 98%    Last Pain:  Vitals:   10/10/22 1157  TempSrc: Oral  PainSc:                  Collene Schlichter

## 2022-10-10 NOTE — Discharge Summary (Signed)
Discharge Summary    Patient ID: [name] MRN: [id number]; DOB: 1940-07-05  Admit date: 10/09/2022 Discharge date: 10/10/2022  PCP:  Noni Saupe, MD   [facility] Providers Cardiologist:  Peter Swaziland, MD        Discharge Diagnoses    Principal Problem:   Atrial flutter with rapid ventricular response [facility]) Active Problems:   Hyperlipidemia LDL goal <70   Coronary artery disease involving native coronary artery with angina pectoris [facility])   Essential hypertension    Diagnostic Studies/Procedures    10/10/22 TTE  IMPRESSIONS     1. Left ventricular ejection fraction, by estimation, is 55 to 60%. The  left ventricle has normal function. The left ventricle has no regional  wall motion abnormalities. Left ventricular diastolic parameters are  indeterminate.   2. Right ventricular systolic function is mildly reduced. The right  ventricular size is normal. There is normal pulmonary artery systolic  pressure. The estimated right ventricular systolic pressure is 34.4 mmHg.   3. Right atrial size was mildly dilated.   4. The mitral valve is grossly normal. Mild mitral valve regurgitation.  No evidence of mitral stenosis.   5. The aortic valve is tricuspid. Aortic valve regurgitation is trivial.  Aortic valve sclerosis is present, with no evidence of aortic valve  stenosis.   6. The inferior vena cava is normal in size with greater than 50%  respiratory variability, suggesting right atrial pressure of 3 mmHg.   FINDINGS   Left Ventricle: Left ventricular ejection fraction, by estimation, is 55  to 60%. The left ventricle has normal function. The left ventricle has no  regional wall motion abnormalities. The left ventricular internal cavity  size was normal in size. There is   no left ventricular hypertrophy. Abnormal (paradoxical) septal motion  consistent with post-operative status. Left ventricular diastolic  parameters are indeterminate.    Right Ventricle: The right ventricular size is normal. No increase in  right ventricular wall thickness. Right ventricular systolic function is  mildly reduced. There is normal pulmonary artery systolic pressure. The  tricuspid regurgitant velocity is 2.80  m/s, and with an assumed right atrial pressure of 3 mmHg, the estimated  right ventricular systolic pressure is 34.4 mmHg.   Left Atrium: Left atrial size was normal in size.   Right Atrium: Right atrial size was mildly dilated.   Pericardium: There is no evidence of pericardial effusion.   Mitral Valve: The mitral valve is grossly normal. Mild mitral valve  regurgitation. No evidence of mitral valve stenosis.   Tricuspid Valve: The tricuspid valve is grossly normal. Tricuspid valve  regurgitation is mild . No evidence of tricuspid stenosis.   Aortic Valve: The aortic valve is tricuspid. Aortic valve regurgitation is  trivial. Aortic valve sclerosis is present, with no evidence of aortic  valve stenosis.   Pulmonic Valve: The pulmonic valve was normal in structure. Pulmonic valve  regurgitation is trivial.   Aorta: The aortic root is normal in size and structure.   Venous: The inferior vena cava is normal in size with greater than 50%  respiratory variability, suggesting right atrial pressure of 3 mmHg.   IAS/Shunts: No atrial level shunt detected by color flow Doppler.  _____________   History of Present Illness     82 y.o. 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  Hospital Course     Consultants: n/a   Atrial flutter with RVR Secondary hypercoagulable state   Patient admitted with rapid atrial flutter, associated with chest discomfort. This arrhythmia occurred despite compliance with home Amiodarone 50mg  QD. Initially patient preferred to avoid DCCV and she was re-loaded on IV amiodarone. However, this morning after further discussion with Dr. Royann Shivers, patient elected to proceed with DCCV (confirmed no missed doses of Eliquis). She underwent successful DCCV with Dr. Jacques Navy. Following restoration of NSR, patient is feeling well, no chest discomfort. She will discharge on increased Amiodarone, 100mg  PO and continue Eliquis 5mg  BID.   Sinus node dysfunction  Patient with history of sinus node dysfunction, has been followed by Dr. Ladona Ridgel. Sinus bradycardia following DCCV today/on IV amiodarone. Given up-titration of Amiodarone as above, patient recommended to monitor her HR over the next week prior to clinic follow up.   CAD  Patient with prior CABG in 2012, DES to LCX in 2021. Initially with chest pain in setting of atrial flutter with RVR. Troponin negative/flat. TTE this admission with stable/preserved LVEF 55-60%. Continue Eliquis.   Hyperlipidemia  LDL as of May of last year 23. Continue annual outpatient follow up. Continue Repatha 140mg  every 2 weeks.  Carotid artery disease  Right ICA with 40-59% stenosis, left ICA with 1-39% stenosis per 07/14/22 ultrasound. Followed by vascular surgery.      Did the patient have an acute coronary syndrome (MI, NSTEMI, STEMI, etc) this admission?:  No                               Did the patient have a percutaneous coronary intervention (stent / angioplasty)?:  No.          _____________  Discharge Vitals Blood pressure (!) 136/98, pulse (!) 57, temperature 98.2 F (36.8 C), temperature source Oral, resp. rate 13, weight 59.4 kg, SpO2 98 %.  Filed Weights   10/10/22 0357   Weight: 59.4 kg    Labs & Radiologic Studies    CBC Recent Labs    10/09/22 0957 10/09/22 1011  WBC 4.7  --   NEUTROABS 2.9  --   HGB 16.2* 17.0*  HCT 51.4* 50.0*  MCV 92.1  --   PLT 154  --    Basic Metabolic Panel Recent Labs    16/10/96 0957 10/09/22 1011  NA 141 141  K 4.3 4.6  CL 103 106  CO2 24  --   GLUCOSE 103* 99  BUN 17 23  CREATININE 1.10* 1.00  CALCIUM 9.3  --    Liver Function Tests Recent Labs    10/09/22 0957  AST 26  ALT 19  ALKPHOS 59  BILITOT 1.0  PROT 7.8  ALBUMIN 4.4   No results for input(s): "LIPASE", "AMYLASE" in the last 72 hours. High Sensitivity Troponin:   Recent Labs  Lab 10/09/22 0957 10/09/22 1155  TROPONINIHS 13 13    BNP Invalid input(s): "POCBNP" D-Dimer No results for input(s): "DDIMER" in the last 72 hours. Hemoglobin A1C No results for input(s): "HGBA1C" in the last 72 hours. Fasting Lipid Panel No results for input(s): "CHOL", "HDL", "LDLCALC", "TRIG", "CHOLHDL", "LDLDIRECT" in the last 72 hours. Thyroid Function Tests  Recent Labs    10/09/22 1917  TSH 3.623   _____________  ECHOCARDIOGRAM COMPLETE  Result Date: 10/10/2022    ECHOCARDIOGRAM REPORT   Patient Name:   [name] Date of Exam: 10/10/2022 Medical Rec #:  [id number]         Height:       66.5 in Accession #:    [id number]        Weight:       131.0 lb Date of Birth:  June 23, 1940          BSA:          1.680 m Patient Age:    82 years          BP:           136/98 mmHg Patient Gender: F                 HR:           51 bpm. Exam Location:  Inpatient Procedure: 2D Echo, Color Doppler and Cardiac Doppler Indications:    R07.9* Chest pain, unspecified  History:        Patient has prior history of Echocardiogram examinations, most                 recent 10/17/2018. Prior CABG, Arrythmias:Atrial Flutter and                 Atrial Fibrillation; Risk Factors:Hypertension and Dyslipidemia.  Sonographer:    Irving Burton Senior RDCS Referring Phys: [id number] SCOTT T WEAVER  IMPRESSIONS  1. Left ventricular ejection fraction, by estimation, is 55 to 60%. The left ventricle has normal function. The left ventricle has no regional wall motion abnormalities. Left ventricular diastolic parameters are indeterminate.  2. Right ventricular systolic function is mildly reduced. The right ventricular size is normal. There is normal pulmonary artery systolic pressure. The estimated right ventricular systolic pressure is 34.4 mmHg.  3. Right atrial size was mildly dilated.  4. The mitral valve is grossly normal. Mild mitral valve regurgitation. No evidence of mitral stenosis.  5. The aortic valve is tricuspid. Aortic valve regurgitation is trivial. Aortic valve sclerosis is present, with no evidence of aortic valve stenosis.  6. The inferior vena cava is normal in size with greater than 50% respiratory variability, suggesting right atrial pressure of 3 mmHg. FINDINGS  Left Ventricle: Left ventricular ejection fraction, by estimation, is 55 to 60%. The left ventricle has normal function. The left ventricle has no regional wall motion abnormalities. The left ventricular internal cavity size was normal in size. There is  no left ventricular hypertrophy. Abnormal (paradoxical) septal motion consistent with post-operative status. Left ventricular diastolic parameters are indeterminate. Right Ventricle: The right ventricular size is normal. No increase in right ventricular wall thickness. Right ventricular systolic function is mildly reduced. There is normal pulmonary artery systolic pressure. The tricuspid regurgitant velocity is 2.80 m/s, and with an assumed right atrial pressure of 3 mmHg, the estimated right ventricular systolic pressure is 34.4 mmHg. Left Atrium: Left atrial size was normal in size. Right Atrium: Right atrial size was mildly dilated. Pericardium: There is no evidence of pericardial effusion. Mitral Valve: The mitral valve is grossly normal. Mild mitral valve regurgitation. No  evidence of mitral valve stenosis. Tricuspid Valve: The tricuspid valve is grossly normal. Tricuspid valve regurgitation is mild . No evidence of tricuspid stenosis. Aortic Valve: The aortic valve is tricuspid. Aortic valve regurgitation is trivial. Aortic valve sclerosis is present, with no  evidence of aortic valve stenosis. Pulmonic Valve: The pulmonic valve was normal in structure. Pulmonic valve regurgitation is trivial. Aorta: The aortic root is normal in size and structure. Venous: The inferior vena cava is normal in size with greater than 50% respiratory variability, suggesting right atrial pressure of 3 mmHg. IAS/Shunts: No atrial level shunt detected by color flow Doppler.  LEFT VENTRICLE PLAX 2D LVIDd:         4.30 cm   Diastology LVIDs:         2.80 cm   LV e' medial:    7.07 cm/s LV PW:         0.90 cm   LV E/e' medial:  13.8 LV IVS:        0.90 cm   LV e' lateral:   12.90 cm/s LVOT diam:     1.90 cm   LV E/e' lateral: 7.6 LV SV:         70 LV SV Index:   42 LVOT Area:     2.84 cm  RIGHT VENTRICLE RV S prime:     6.96 cm/s TAPSE (M-mode): 1.5 cm LEFT ATRIUM             Index        RIGHT ATRIUM           Index LA diam:        3.50 cm 2.08 cm/m   RA Area:     20.00 cm LA Vol (A2C):   50.5 ml 30.06 ml/m  RA Volume:   58.40 ml  34.76 ml/m LA Vol (A4C):   37.7 ml 22.44 ml/m LA Biplane Vol: 44.4 ml 26.43 ml/m  AORTIC VALVE LVOT Vmax:   87.60 cm/s LVOT Vmean:  71.500 cm/s LVOT VTI:    0.247 m  AORTA Ao Root diam: 2.60 cm MITRAL VALVE               TRICUSPID VALVE MV Area (PHT): 3.12 cm    TR Peak grad:   31.4 mmHg MV Decel Time: 243 msec    TR Vmax:        280.00 cm/s MV E velocity: 97.40 cm/s MV A velocity: 52.10 cm/s  SHUNTS MV E/A ratio:  1.87        Systemic VTI:  0.25 m                            Systemic Diam: 1.90 cm Lennie Odor MD Electronically signed by Lennie Odor MD Signature Date/Time: 10/10/2022/1:24:23 PM    Final    EP STUDY  Result Date: 10/10/2022 See surgical note for  result.  CT Angio Chest/Abd/Pel for Dissection W and/or W/WO  Result Date: 10/09/2022 CLINICAL DATA:  Chest pressure radiating to the back.  Tachycardia. EXAM: CT ANGIOGRAPHY CHEST, ABDOMEN AND PELVIS TECHNIQUE: Non-contrast CT of the chest was initially obtained. Multidetector CT imaging through the chest, abdomen and pelvis was performed using the standard protocol during bolus administration of intravenous contrast. Multiplanar reconstructed images and MIPs were obtained and reviewed to evaluate the vascular anatomy. RADIATION DOSE REDUCTION: This exam was performed according to the departmental dose-optimization program which includes automated exposure control, adjustment of the mA and/or kV according to patient size and/or use of iterative reconstruction technique. CONTRAST:  OMNIPAQUE IOHEXOL 350 MG/ML SOLN COMPARISON:  Chest x-ray from same day. CT chest, abdomen, and pelvis dated January 31, 2021. FINDINGS: CTA CHEST FINDINGS Cardiovascular: Preferential opacification  of the thoracic aorta. No evidence of thoracic aortic aneurysm or dissection. Coronary, aortic arch, and branch vessel atherosclerotic vascular disease. Borderline cardiomegaly status post CABG. No pericardial effusion. No pulmonary embolism. Mediastinum/Nodes: No enlarged mediastinal, hilar, or axillary lymph nodes. Thyroid gland, trachea, and esophagus demonstrate no significant findings. Lungs/Pleura: Unchanged mild centrilobular tree-in-bud nodularity in the anterior right upper lobe with small areas of focal bronchiolectasis and mucoid impaction. No focal consolidation, pleural effusion, or pneumothorax. Similar minimal scarring in the medial right middle lobe and lingula. Musculoskeletal: No chest wall abnormality. No acute or significant osseous findings. Review of the MIP images confirms the above findings. CTA ABDOMEN AND PELVIS FINDINGS VASCULAR Aorta: Normal caliber aorta without aneurysm, dissection, vasculitis or  significant stenosis. Atherosclerotic calcification. 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 Otherwise patent without aneurysm, dissection, or vasculitis. SMA: Patent without evidence of aneurysm, dissection, vasculitis or significant stenosis. Renals: Unchanged mild-to-moderate stenosis of the proximal right renal artery. Both renal arteries are patent without evidence of aneurysm, dissection, vasculitis, or fibromuscular dysplasia. IMA: Patent without evidence of aneurysm, dissection, vasculitis or significant stenosis. Inflow: Patent without evidence of aneurysm, dissection, vasculitis or significant stenosis. Veins: No obvious venous abnormality within the limitations of this arterial phase study. Review of the MIP images confirms the above findings. NON-VASCULAR Hepatobiliary: No focal liver abnormality is seen. Status post cholecystectomy. No biliary dilatation. Pancreas: Unremarkable. No pancreatic ductal dilatation or surrounding inflammatory changes. Spleen: Normal in size without focal abnormality. Adrenals/Urinary Tract: Adrenal glands are unremarkable. Kidneys are normal, without renal calculi, focal lesion, or hydronephrosis. Bladder is unremarkable. Stomach/Bowel: Stomach is within normal limits. History of prior appendectomy. Postsurgical changes from prior sigmoid colon resection. No evidence of bowel wall thickening, distention, or inflammatory changes. Lymphatic: No enlarged abdominal or pelvic lymph nodes. Reproductive: Uterus and bilateral adnexa are unremarkable. Other: Unchanged diastasis of the lower anterior abdominal wall containing non-dilated loops of small bowel. No free fluid or pneumoperitoneum. Musculoskeletal: No acute or significant osseous findings. Review of the MIP images confirms the above findings. IMPRESSION: VASCULAR: 1. No evidence of aortic aneurysm or dissection. 2. 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 3. 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 4.  Aortic atherosclerosis (ICD10-I70.0). CHEST: 1. No acute intrathoracic process. 2. Unchanged mild centrilobular tree-in-bud nodularity in the anterior right upper lobe with small areas of focal bronchiolectasis and mucoid impaction, consistent with chronic atypical infection. ABDOMEN AND PELVIS: 1. No acute intra-abdominal process. Electronically Signed   By: Obie Dredge M.D.   On: 10/09/2022 11:36   DG Chest Port 1 View  Result Date: 10/09/2022 CLINICAL DATA:  Shortness of breath. EXAM: PORTABLE CHEST 1 VIEW COMPARISON:  Chest x-ray dated June 23, 2013. FINDINGS: Heart is at the upper limits of normal in size status post CABG. Normal pulmonary vascularity. No focal consolidation, pleural effusion, or pneumothorax. No acute osseous abnormality. IMPRESSION: No active disease. Electronically Signed   By: Obie Dredge M.D.   On: 10/09/2022 11:03   Disposition   Pt is being discharged home today in good condition.  Follow-up Plans & Appointments     Follow-up Information     Joylene Grapes, NP Follow up on 10/17/2022.   Specialties: Cardiology, Family Medicine Why: Follow up with Bernadene Person, NP at [facility] office at 2:45pm. Contact information: [address] [place] [address] [phone]  Discharge Instructions     Diet - low sodium heart healthy   Complete by: As directed    Increase activity slowly   Complete by: As directed         Discharge Medications   Allergies as of 10/10/2022       Reactions   Apple Anaphylaxis   Nadolol Hives, Anaphylaxis   Rash and nausea   Other Other (See Comments), Anaphylaxis   apples Apples-anaphylaxis   Sulfa Antibiotics Hives, Anaphylaxis   Sulfamethoxazole Anaphylaxis   Sulfonamide Derivatives Anaphylaxis   Verapamil Other (See Comments), Anaphylaxis   seizures   Crestor [rosuvastatin] Other (See Comments)   Myalgias    Flagyl [metronidazole] Hives        Medication List     TAKE these medications    amiodarone 100 MG tablet Commonly known as: Pacerone Take 1 tablet (100 mg total) by mouth daily. What changed: See the new instructions.   ascorbic acid 1000 MG tablet Commonly known as: VITAMIN C Take 1,000 mg by mouth daily.   Calcium Carb-Cholecalciferol 600-800 MG-UNIT Tabs Take 1 tablet by mouth daily.   Eliquis 5 MG Tabs tablet Generic drug: apixaban Take 1 tablet by mouth twice daily What changed: how much to take   losartan 50 MG tablet Commonly known as: COZAAR Take 1 tablet by mouth once daily   Multi-Vitamin tablet Take 1 tablet by mouth daily.   Probiotic-Prebiotic 1-250 BILLION-MG Caps Take 1 capsule by mouth daily.   Repatha SureClick 140 MG/ML Soaj Generic drug: Evolocumab INJECT 140 MG SUBCUTANEOUSLY EVERY 14 DAYS What changed: See the new instructions.           Outstanding Labs/Studies     Duration of Discharge Encounter   Greater than 30 minutes including physician time.  Con Memos, PA-C 10/10/2022, 3:31 PM

## 2022-10-10 NOTE — Anesthesia Preprocedure Evaluation (Signed)
Anesthesia Evaluation  Patient identified by MRN, date of birth, ID band Patient awake    Reviewed: Allergy & Precautions, NPO status , Patient's Chart, lab work & pertinent test results  Airway Mallampati: II  TM Distance: >3 FB Neck ROM: Full    Dental  (+) Dental Advisory Given, Edentulous Upper   Pulmonary shortness of breath   Pulmonary exam normal breath sounds clear to auscultation       Cardiovascular hypertension, + CAD, + Past MI and + CABG  + dysrhythmias Atrial Fibrillation  Rhythm:Irregular Rate:Abnormal     Neuro/Psych Seizures -,     GI/Hepatic Neg liver ROS, hiatal hernia, PUD,GERD  ,,  Endo/Other  negative endocrine ROS    Renal/GU Renal disease     Musculoskeletal  (+) Arthritis ,    Abdominal   Peds  Hematology negative hematology ROS (+)   Anesthesia Other Findings Day of surgery medications reviewed with the patient.  Reproductive/Obstetrics                              Anesthesia Physical Anesthesia Plan  ASA: 3  Anesthesia Plan: General   Post-op Pain Management: Minimal or no pain anticipated   Induction: Intravenous  PONV Risk Score and Plan: 3 and TIVA and Treatment may vary due to age or medical condition  Airway Management Planned: Natural Airway and Simple Face Mask  Additional Equipment:   Intra-op Plan:   Post-operative Plan:   Informed Consent: I have reviewed the patients History and Physical, chart, labs and discussed the procedure including the risks, benefits and alternatives for the proposed anesthesia with the patient or authorized representative who has indicated his/her understanding and acceptance.     Dental advisory given  Plan Discussed with: CRNA  Anesthesia Plan Comments:          Anesthesia Quick Evaluation

## 2022-10-11 ENCOUNTER — Encounter (HOSPITAL_COMMUNITY): Payer: Self-pay | Admitting: Internal Medicine

## 2022-10-16 NOTE — Progress Notes (Unsigned)
Cardiology Office Note:    Date:  10/16/2022   ID:  Abrial, Dellapenna [birth year], MRN [id number]  PCP:  Noni Saupe, MD   [facility] Providers Cardiologist:  Peter Swaziland, MD { Click to update primary MD,subspecialty MD or APP then REFRESH:1}    Referring MD: Noni Saupe, MD   Chief complaint: Hospital follow-up for atrial fib with RVR  History of Present Illness:    [name] is a 82 y.o. 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  She was admitted with rapid atrial flutter associated with chest discomfort.  Initially she was reloaded with IV amiodarone.  She then underwent a successful DCCV.   Past Medical History:  Diagnosis Date   Anemia    "in my early teens"   Arthritis    neck and back   Atrial fibrillation (HCC)    post-op after CABG;  treated with Amiodarone and coumadin until readmxn with CDiff colitis, perf colon and elevated LFTs (amio and coumadin stopped)   Atrial flutter with rapid ventricular response (HCC) 10/09/2022   C. difficile colitis 12/2010   complicated post CABG course with prolonged admission, perforated sigmoid colon;  s/p sigmoid colectomy with end colostomy [name] procedure);  laparotomy wound infection with Pseudomonas treated with antibxs and secondary intention   Chronic kidney disease    hx kidney stones   Coronary artery disease    a. NSTEMI 7/12, cath: pLAD 30%, mLAD 60-70%, dLAD 80%, oOM2 50%, mOM2 40%, trifurcation 80%, pRCA 25%, then occluded, L-R collats, EF 55%;   b. 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 Cornelius Moras) // c. ETT 3/18: inf-lat ST depression of 1 mm at 4 min of exercise, frequent ectopy  and hypertensive BP response // d. 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   GERD (gastroesophageal reflux disease)    H/O hiatal hernia    "slight; not having problems with it"   Hair loss    after heart surgery   Hemorrhoids    "none since hemorrhoid OR"   History of bronchitis    late 1980's   Hyperlipidemia    takes Pravastatin daily   Hypertension    takes Metoprolol daily   Mitral valve prolapse    was told this in the late 1990's; ;then with Dr.Mclean he states that she doesn't   Myocardial infarction (HCC) 11/2010   Palpitations 2003   hx of; Holter in 2003 with PACs and PVCs   Pericarditis 1980   hx of   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   DR.OWEN   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 in setting of CDiff colitis   Seizures (HCC)    "w/verapimil"   Surgical wound infection    Pseudomonas; wound VAC; secondary intention   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    Past Surgical History:  Procedure Laterality Date   ABDOMINAL HYSTERECTOMY     partial hysterectomy   APPENDECTOMY     as a child   BOTOX INJECTION  12/03/2015   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   CARDIAC CATHETERIZATION  11/21/10   CARDIOVERSION N/A  10/10/2022   Procedure: CARDIOVERSION;  Surgeon: Parke Poisson, MD;  Location: [facility] INVASIVE CV LAB;  Service: Cardiovascular;  Laterality: N/A;   CATARACT EXTRACTION, BILATERAL     CHOLECYSTECTOMY N/A 01/25/2016   Procedure: LAPAROSCOPIC CHOLECYSTECTOMY WITH INTRAOPERATIVE CHOLANGIOGRAM;  Surgeon: Gaynelle Adu, MD;  Location: [facility] ORS;  Service: General;  Laterality: N/A;   COLONOSCOPY     COLOSTOMY RECONNECT     colostomy reversal  06/27/11   CORONARY ANGIOPLASTY WITH STENT PLACEMENT     CORONARY ARTERY BYPASS GRAFT  11-25-2010   CABG X3; LIMA to LAD, SVG to PDA, SVG to OM, EVH via right thigh   ESOPHAGOGASTRODUODENOSCOPY     FLEXIBLE SIGMOIDOSCOPY  11/09/2011   Procedure: FLEXIBLE  SIGMOIDOSCOPY;  Surgeon: Rachael Fee, MD;  Location: [facility] ENDOSCOPY;  Service: Endoscopy;  Laterality: N/A;   HEMORRHOID SURGERY  ~ 2005   x 2   OOPHORECTOMY  2005   left; w/"mass removal"   PROCTOSCOPY  06/27/2011   Procedure: PROCTOSCOPY;  Surgeon: Atilano Ina, MD;  Location: [facility] OR;  Service: General;  Laterality: N/A;   SIGMOIDECTOMY  12/18/2010   with end colostomy   TUBAL LIGATION      Current Medications: No outpatient medications have been marked as taking for the 10/17/22 encounter (Appointment) with Joylene Grapes, NP.     Allergies:   Apple, Nadolol, Other, Sulfa antibiotics, Sulfamethoxazole, Sulfonamide derivatives, Verapamil, Crestor [rosuvastatin], and Flagyl [metronidazole]   Social History   Socioeconomic History   Marital status: Married    Spouse name: Not on file   Number of children: 1   Years of education: Not on file   Highest education level: Not on file  Occupational History   Occupation: LPN    Employer: [employer]  Tobacco Use   Smoking status: Never    Passive exposure: Never   Smokeless tobacco: Never  Vaping Use   Vaping Use: Never used  Substance and Sexual Activity   Alcohol use: No   Drug use: No   Sexual activity: Yes    Birth control/protection: Post-menopausal  Other Topics Concern   Not on file  Social History Narrative   Right handed   Lives with husband one story home   Social Determinants of Health   Financial Resource Strain: Not on file  Food Insecurity: No Food Insecurity (10/10/2022)   Hunger Vital Sign    Worried About Running Out of Food in the Last Year: Never true    Ran Out of Food in the Last Year: Never true  Transportation Needs: No Transportation Needs (10/10/2022)   PRAPARE - Administrator, Civil Service (Medical): No    Lack of Transportation (Non-Medical): No  Physical Activity: Not on file  Stress: Not on file  Social Connections: Not on file     Family History: The patient's  ***family history includes Coronary artery disease in her mother; Heart attack (age of onset: 43) in her father; Heart disease in her father and mother. There is no history of Anesthesia problems, Hypotension, Malignant hyperthermia, Pseudochol deficiency, Colon cancer, Esophageal cancer, Stomach cancer, or Rectal cancer.  ROS:   Please see the history of present illness.    *** All other systems reviewed and are negative.  EKGs/Labs/Other Studies Reviewed:    The following studies were reviewed today: ***  EKG:  EKG is *** ordered today.  The ekg ordered today demonstrates ***  10/10/22 TTE   Left Ventricle: Left ventricular ejection fraction, by  estimation, is 55  to 60%. The left ventricle has normal function. The left ventricle has no  regional wall motion abnormalities. The left ventricular internal cavity  size was normal in size. There is   no left ventricular hypertrophy. Abnormal (paradoxical) septal motion  consistent with post-operative status. Left ventricular diastolic  parameters are indeterminate.   Right Ventricle: The right ventricular size is normal. No increase in  right ventricular wall thickness. Right ventricular systolic function is  mildly reduced. There is normal pulmonary artery systolic pressure. The  tricuspid regurgitant velocity is 2.80  m/s, and with an assumed right atrial pressure of 3 mmHg, the estimated  right ventricular systolic pressure is 34.4 mmHg.   Left Atrium: Left atrial size was normal in size.   Right Atrium: Right atrial size was mildly dilated.   Pericardium: There is no evidence of pericardial effusion.   Mitral Valve: The mitral valve is grossly normal. Mild mitral valve  regurgitation. No evidence of mitral valve stenosis.   Tricuspid Valve: The tricuspid valve is grossly normal. Tricuspid valve  regurgitation is mild . No evidence of tricuspid stenosis.   Aortic Valve: The aortic valve is tricuspid. Aortic valve  regurgitation is  trivial. Aortic valve sclerosis is present, with no evidence of aortic  valve stenosis.   Pulmonic Valve: The pulmonic valve was normal in structure. Pulmonic valve  regurgitation is trivial.   Aorta: The aortic root is normal in size and structure.   Venous: The inferior vena cava is normal in size with greater than 50%  respiratory variability, suggesting right atrial pressure of 3 mmHg.   IAS/Shunts: No atrial level shunt detected by color flow Doppler.     Recent Labs: 10/09/2022: ALT 19; B Natriuretic Peptide 288.2; BUN 23; Creatinine, Ser 1.00; Hemoglobin 17.0; Platelets 154; Potassium 4.6; Sodium 141; TSH 3.623  Recent Lipid Panel    Component Value Date/Time   CHOL 133 09/20/2021 0951   TRIG 90 09/20/2021 0951   HDL 77 09/20/2021 0951   CHOLHDL 1.7 09/20/2021 0951   CHOLHDL 3 04/13/2014 0934   VLDL 24.6 04/13/2014 0934   LDLCALC 39 09/20/2021 0951   LDLDIRECT 81.8 07/20/2011 1504     Risk Assessment/Calculations:   {Does this patient have ATRIAL FIBRILLATION?[phone]}  No BP recorded.  {Refresh Note OR Click here to enter BP  :1}***         Physical Exam:    VS:  There were no vitals taken for this visit.    Wt Readings from Last 3 Encounters:  10/10/22 131 lb (59.4 kg)  03/24/22 132 lb 9.6 oz (60.1 kg)  11/09/21 135 lb (61.2 kg)     GEN: *** Well nourished, well developed in no acute distress HEENT: Normal NECK: No JVD; No carotid bruits LYMPHATICS: No lymphadenopathy CARDIAC: ***RRR, no murmurs, rubs, gallops RESPIRATORY:  Clear to auscultation without rales, wheezing or rhonchi  ABDOMEN: Soft, non-tender, non-distended MUSCULOSKELETAL:  No edema; No deformity  SKIN: Warm and dry NEUROLOGIC:  Alert and oriented x 3 PSYCHIATRIC:  Normal affect   ASSESSMENT:    No diagnosis found. PLAN:    In order of problems listed above:  ***      {Are you ordering a CV Procedure (e.g. stress test, cath, DCCV, TEE, etc)?   Press F2         :[id number]}    Medication Adjustments/Labs and Tests Ordered: Current medicines are reviewed at length with the patient today.  Concerns regarding medicines are outlined above.  No orders of the defined types were placed in this encounter.  No orders of the defined types were placed in this encounter.   There are no Patient Instructions on file for this visit.   Signed, Rip Harbour, NP  10/16/2022 9:59 PM    [facility]

## 2022-10-17 ENCOUNTER — Ambulatory Visit: Payer: PPO | Attending: Nurse Practitioner | Admitting: Cardiology

## 2022-10-17 ENCOUNTER — Encounter: Payer: Self-pay | Admitting: Nurse Practitioner

## 2022-10-17 VITALS — BP 120/48 | HR 52 | Ht 66.0 in | Wt 132.5 lb

## 2022-10-17 DIAGNOSIS — R001 Bradycardia, unspecified: Secondary | ICD-10-CM | POA: Diagnosis not present

## 2022-10-17 DIAGNOSIS — I4892 Unspecified atrial flutter: Secondary | ICD-10-CM

## 2022-10-17 DIAGNOSIS — I779 Disorder of arteries and arterioles, unspecified: Secondary | ICD-10-CM

## 2022-10-17 DIAGNOSIS — I480 Paroxysmal atrial fibrillation: Secondary | ICD-10-CM | POA: Diagnosis not present

## 2022-10-17 DIAGNOSIS — I10 Essential (primary) hypertension: Secondary | ICD-10-CM | POA: Diagnosis not present

## 2022-10-17 DIAGNOSIS — I25119 Atherosclerotic heart disease of native coronary artery with unspecified angina pectoris: Secondary | ICD-10-CM

## 2022-10-17 NOTE — Patient Instructions (Signed)
Medication Instructions:  Your physician recommends that you continue on your current medications as directed. Please refer to the Current Medication list given to you today.  *If you need a refill on your cardiac medications before your next appointment, please call your pharmacy*   Lab Work: NONE ordered at this time of appointment   If you have labs (blood work) drawn today and your tests are completely normal, you will receive your results only by: MyChart Message (if you have MyChart) OR A paper copy in the mail If you have any lab test that is abnormal or we need to change your treatment, we will call you to review the results.   Testing/Procedures: NONE ordered at this time of appointment     Follow-Up: At [facility], you and your health needs are our priority.  As part of our continuing mission to provide you with exceptional heart care, we have created designated Provider Care Teams.  These Care Teams include your primary Cardiologist (physician) and Advanced Practice Providers (APPs -  Physician Assistants and Nurse Practitioners) who all work together to provide you with the care you need, when you need it.  We recommend signing up for the patient portal called "MyChart".  Sign up information is provided on this After Visit Summary.  MyChart is used to connect with patients for Virtual Visits (Telemedicine).  Patients are able to view lab/test results, encounter notes, upcoming appointments, etc.  Non-urgent messages can be sent to your provider as well.   To learn more about what you can do with MyChart, go to [email or web address].    Your next appointment:   2-3 month(s) Urgent EP APT needed Provider:   Any APP    Other Instructions

## 2022-10-20 ENCOUNTER — Encounter: Payer: Self-pay | Admitting: Cardiology

## 2022-10-20 ENCOUNTER — Ambulatory Visit: Payer: PPO | Attending: Physician Assistant | Admitting: Cardiology

## 2022-10-20 ENCOUNTER — Ambulatory Visit: Payer: PPO | Admitting: Physician Assistant

## 2022-10-20 VITALS — BP 146/60 | HR 53 | Ht 66.0 in | Wt 133.0 lb

## 2022-10-20 DIAGNOSIS — R001 Bradycardia, unspecified: Secondary | ICD-10-CM | POA: Diagnosis not present

## 2022-10-20 DIAGNOSIS — I480 Paroxysmal atrial fibrillation: Secondary | ICD-10-CM | POA: Diagnosis not present

## 2022-10-20 NOTE — Progress Notes (Signed)
  Electrophysiology Office Follow up Visit Note:    Date:  10/20/2022   ID:  [name], [name] [birth year], MRN [id number]  PCP:  Noni Saupe, MD  [facility] Cardiologist:  Peter Swaziland, MD  [facility] Electrophysiologist:  None    Interval History:    [name] is a 82 y.o. female who presents for a follow up visit.    She is with her husband today in clinic.  She last saw Ralph Dowdy on October 17, 2022.  She is a patient of Dr. Lubertha Basque.  She has an extensive medical history that includes coronary artery disease post CABG in 2012, sinus node dysfunction, carotid artery stenosis, hypertension, hyperlipidemia, ulcerative colitis.  She is maintained on amiodarone for her atrial fibrillation but doses have been limited because of bradycardia.  She came to clinic today asking to be seen because of heart rates in the 40s at home.  She tells me that at times she feels weak during episodes of low heart rates.  No syncope or presyncope.      Past medical, surgical, social and family history were reviewed.  ROS:   Please see the history of present illness.    All other systems reviewed and are negative.  EKGs/Labs/Other Studies Reviewed:    The following studies were reviewed today:    EKG:  The ekg ordered today demonstrates sinus bradycardia with a ventricular rate of 53 bpm   Physical Exam:    VS:  BP (!) 146/60   Pulse (!) 53   Ht 5\' 6"  (1.676 m)   Wt 133 lb (60.3 kg)   SpO2 96%   BMI 21.47 kg/m     Wt Readings from Last 3 Encounters:  10/20/22 133 lb (60.3 kg)  10/17/22 132 lb 8 oz (60.1 kg)  10/10/22 131 lb (59.4 kg)     GEN:  Well nourished, well developed in no acute distress CARDIAC: RRR, no murmurs, rubs, gallops RESPIRATORY:  Clear to auscultation without rales, wheezing or rhonchi       ASSESSMENT:    1. Paroxysmal atrial fibrillation (HCC)   2. Bradycardia    PLAN:    In order of problems listed above:   #Persistent atrial  fibrillation Maintaining sinus rhythm after her recent cardioversion and while taking amiodarone 50 mg by mouth once daily. Continue amiodarone 50 mg by mouth once daily. Continue Eliquis 5 twice daily  #Sinus bradycardia No indication for permanent pacing at this time.  We discussed the possibility that she would require permanent pacing in the future.  Follow-up with Dr. Ladona Ridgel.    Signed, Steffanie Dunn, MD, [facility], [facility] 10/20/2022 4:44 PM    Electrophysiology [facility]

## 2022-10-20 NOTE — Patient Instructions (Signed)
Medication Instructions:  Your physician recommends that you continue on your current medications as directed. Please refer to the Current Medication list given to you today.  *If you need a refill on your cardiac medications before your next appointment, please call your pharmacy*  Follow-Up: At [facility], you and your health needs are our priority.  As part of our continuing mission to provide you with exceptional heart care, we have created designated Provider Care Teams.  These Care Teams include your primary Cardiologist (physician) and Advanced Practice Providers (APPs -  Physician Assistants and Nurse Practitioners) who all work together to provide you with the care you need, when you need it.   Your next appointment:   6 month(s)  Provider:   Lewayne Bunting, MD

## 2022-10-24 ENCOUNTER — Other Ambulatory Visit: Payer: Self-pay | Admitting: Cardiology

## 2022-10-24 DIAGNOSIS — I480 Paroxysmal atrial fibrillation: Secondary | ICD-10-CM

## 2022-10-24 NOTE — Telephone Encounter (Signed)
Prescription refill request for Eliquis received. Indication: PAF Last office visit: 10/20/22  Jeanie Cooks MD Scr: 1.00 on 10/09/22  Epic Age: 82 Weight: 60.3kg  Based on above findings Eliquis 5mg  twice daily is the appropriate dose.  Refill approved.

## 2022-11-13 ENCOUNTER — Telehealth: Payer: Self-pay | Admitting: Gastroenterology

## 2022-11-13 NOTE — Telephone Encounter (Signed)
Left message on machine to call back  

## 2022-11-13 NOTE — Telephone Encounter (Signed)
The pt returned call and states she has had diarrhea this past week. She has a hx with Dr Christella Hartigan of [facility]. She is taking Apriso per her report.  She is aware that Dr Christella Hartigan is not practicing at this time and agrees to see Dr Leone Payor on Friday.  Appt made. She will maintain a bland diet for now. She tells me that she has taken Pepto bismol without relief. I have advised she can try imodium but should keep the appt with Dr Leone Payor. She will call back if she worsens or develops bleeding or other symptoms.

## 2022-11-13 NOTE — Telephone Encounter (Signed)
PT is experiencing severe diarrhea. She is looking for suggestion on relief. Advised her the first available isnt until September. Requesting a call back from nurse.

## 2022-11-14 DIAGNOSIS — I447 Left bundle-branch block, unspecified: Secondary | ICD-10-CM | POA: Diagnosis not present

## 2022-11-14 DIAGNOSIS — I4891 Unspecified atrial fibrillation: Secondary | ICD-10-CM | POA: Diagnosis not present

## 2022-11-14 DIAGNOSIS — R1013 Epigastric pain: Secondary | ICD-10-CM | POA: Diagnosis not present

## 2022-11-14 DIAGNOSIS — R110 Nausea: Secondary | ICD-10-CM | POA: Diagnosis not present

## 2022-11-14 DIAGNOSIS — R197 Diarrhea, unspecified: Secondary | ICD-10-CM | POA: Diagnosis not present

## 2022-11-14 DIAGNOSIS — K5180 Other ulcerative colitis without complications: Secondary | ICD-10-CM | POA: Diagnosis not present

## 2022-11-14 DIAGNOSIS — E860 Dehydration: Secondary | ICD-10-CM | POA: Diagnosis not present

## 2022-11-17 ENCOUNTER — Encounter: Payer: Self-pay | Admitting: Internal Medicine

## 2022-11-17 ENCOUNTER — Other Ambulatory Visit: Payer: PPO

## 2022-11-17 ENCOUNTER — Ambulatory Visit: Payer: PPO | Admitting: Internal Medicine

## 2022-11-17 ENCOUNTER — Ambulatory Visit (INDEPENDENT_AMBULATORY_CARE_PROVIDER_SITE_OTHER)
Admission: RE | Admit: 2022-11-17 | Discharge: 2022-11-17 | Disposition: A | Discharge status: Home / Self Care | Payer: PPO | Source: Ambulatory Visit | Attending: Internal Medicine | Admitting: Internal Medicine

## 2022-11-17 VITALS — BP 120/62 | HR 62 | Ht 66.0 in | Wt 130.0 lb

## 2022-11-17 DIAGNOSIS — R197 Diarrhea, unspecified: Secondary | ICD-10-CM

## 2022-11-17 DIAGNOSIS — K51918 Ulcerative colitis, unspecified with other complication: Secondary | ICD-10-CM

## 2022-11-17 DIAGNOSIS — D582 Other hemoglobinopathies: Secondary | ICD-10-CM | POA: Diagnosis not present

## 2022-11-17 MED ORDER — MESALAMINE ER 0.375 G PO CP24: 1.8750 g | ORAL_CAPSULE | Freq: Every day | ORAL | 2 refills | Status: DC

## 2022-11-17 NOTE — Progress Notes (Addendum)
[name] 82 y.o. 1941/02/01 [id number]  Assessment & Plan:   Encounter Diagnoses  Name Primary?   Ulcerative colitis with other complication, unspecified location (HCC) Yes   Diarrhea, unspecified type    Abnormal hemoglobin (HCC)    It sounds like her colitis has been flaring.  I do not think it is a recurrent stricture but it is possible.  She has not had any stools in about 36 hours perhaps it was an acute on chronic situation.  I am going to evaluate with the studies below.  I have also recommended she return to full dosing of Apriso generic.  She has 60th wedding anniversary trip to [place] coming up on July 18.  Husband is also retiring as a Medical sales representative.  Hopefully she can make that trip I am optimistic but we will have to see how it goes.  She will continue to follow-up with me in [name] absence as it is unlikely he will return.  I think a colonoscopy may be required at some point.  Elevated hemoglobin noted.  This was during cardioversion visit.  Will see what it was in the ER in [place].  This could need hematologic evaluation question polycythemia.  Orders Placed This Encounter  Procedures   Calprotectin, Fecal   Stool Culture   Clostridium difficile Toxin B, Qualitative, Real-Time PCR   DG Abd 2 Views   Meds ordered this encounter  Medications   mesalamine (APRISO) 0.375 g 24 hr capsule    Sig: Take 5 capsules (1.875 g total) by mouth daily.    Dispense:  150 capsule    Refill:  2     Subjective:  Review of pertinent gastrointestinal problems:   1. C. Diff coltis, eventually perforated sigmoid, s/p segmental colectomy, reversed 2/13.   2. 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 She underwent 3-4 sequential sigmoidoscopies with dilation of the stricture. Eventually dilated up to 20 mm with very good results in her diarrhea. Last examination was August 2013, this was a full  colonoscopy. No polyps were seen. She did have proximal to the site of the previous stricture, the colon was somewhat edematous.   3. routine risk for colon cancer, next colonoscopy August 2023 4. Chronic colitis, July 2014 colonoscopy (mildly inflamed colon throughout, biopsies confirmed chronic inflammation). This was done for diarrhea. CC anastomosis at the time was open to 17mm, repeat dilation performed. Pred 20mg  daily was started. Hot flashes, racing heart and so changed to uceris 9mg  per day. Sept 2014; started mesalamine 4.8 grams per day,  with good response (apriso).  Tapered to 2 pills once daily 2021, and she tolerated this well as of follow-up appointment in 2022.  Chief Complaint:  HPI 82 year old white woman, previously a patient of [name], with a history of chronic colitis (suspected UC), perforated colon in the setting of C. difficile with an anastomotic stricture requiring dilatation, CAD status post CABG 2012 and atrial fibrillation on amiodarone and Eliquis.  Recently called the office and was complaining of diarrhea.  She was weaned from [facility] last year at the direction of [name].  She did well over time, but she started having some stuttering diarrhea infection may have been constipated been having diarrhea in the past several months or so.  Then things got much worse about a week ago with numerous watery stools without pain bleeding or fever.  She actually went to the emergency room in [place] on Tuesday  which was 3 days ago and was given IV fluids and she says she was told her kidney function was fine.  That doctor put her back on 0.375 mg mesalamine daily.  She had 12 watery stools overnight ending at 730 yesterday and has not had defecation since.  No obvious history of recent antibiotic she was hospitalized briefly for cardioversion May 27.  No antibiotics were given.  No sick contacts with diarrhea.  Phone note documentation November 13, 2022 he pt returned call and  states she has had diarrhea this past week. She has a hx with Dr Christella Hartigan of UC. She is taking Apriso per her report. She is aware that Dr Christella Hartigan is not practicing at this time and agrees to see Dr Leone Payor on Friday. Appt made. She will maintain a bland diet for now. She tells me that she has taken Pepto bismol without relief. I have advised she can try imodium but should keep the appt with Dr Leone Payor. She will call back if she worsens or develops bleeding or other symptoms.      Latest Ref Rng & Units 10/09/2022   10:11 AM 10/09/2022    9:57 AM 03/16/2021   10:45 AM  CBC  WBC 4.0 - 10.5 K/uL  4.7  3.6   Hemoglobin 12.0 - 15.0 g/dL 82.9  56.2  13.0   Hematocrit 36.0 - 46.0 % 50.0  51.4  39.1   Platelets 150 - 400 K/uL  154  139     Allergies  Allergen Reactions   Apple Anaphylaxis   Nadolol Hives and Anaphylaxis    Rash and nausea   Other Other (See Comments) and Anaphylaxis    apples Apples-anaphylaxis   Sulfa Antibiotics Hives and Anaphylaxis   Sulfamethoxazole Anaphylaxis   Sulfonamide Derivatives Anaphylaxis   Verapamil Other (See Comments) and Anaphylaxis    seizures   Crestor [Rosuvastatin] Other (See Comments)    Myalgias    Flagyl [Metronidazole] Hives   Current Meds  Medication Sig   amiodarone (PACERONE) 100 MG tablet Take 1 tablet (100 mg total) by mouth daily. (Patient taking differently: Take 50 mg by mouth daily.)   apixaban (ELIQUIS) 5 MG TABS tablet Take 1 tablet by mouth twice daily   ascorbic acid (VITAMIN C) 1000 MG tablet Take 1,000 mg by mouth daily.   Bacillus Coagulans-Inulin (PROBIOTIC-PREBIOTIC) 1-250 BILLION-MG CAPS Take 1 capsule by mouth daily.   Evolocumab (REPATHA SURECLICK) 140 MG/ML SOAJ INJECT 140 MG SUBCUTANEOUSLY EVERY 14 DAYS (Patient taking differently: Inject 140 mg into the skin every 14 (fourteen) days.)   losartan (COZAAR) 50 MG tablet Take 1 tablet by mouth once daily (Patient taking differently: Take 50 mg by mouth daily.)   Multiple  Vitamin (MULTI-VITAMIN) tablet Take 1 tablet by mouth daily.   Past Medical History:  Diagnosis Date   Anemia    "in my early teens"   Arthritis    neck and back   Atrial fibrillation (HCC)    post-op after CABG;  treated with Amiodarone and coumadin until readmxn with CDiff colitis, perf colon and elevated LFTs (amio and coumadin stopped)   Atrial flutter with rapid ventricular response (HCC) 10/09/2022   C. difficile colitis 12/2010   complicated post CABG course with prolonged admission, perforated sigmoid colon;  s/p sigmoid colectomy with end colostomy [name] procedure);  laparotomy wound infection with Pseudomonas treated with antibxs and secondary intention   Chronic kidney disease    hx kidney stones   Coronary artery disease  a. NSTEMI 7/12, cath: pLAD 30%, mLAD 60-70%, dLAD 80%, oOM2 50%, mOM2 40%, trifurcation 80%, pRCA 25%, then occluded, L-R collats, EF 55%;   b. 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 Cornelius Moras) // c. ETT 3/18: inf-lat ST depression of 1 mm at 4 min of exercise, frequent ectopy and hypertensive BP response // d. 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   GERD (gastroesophageal reflux disease)    H/O hiatal hernia    "slight; not having problems with it"   Hair loss    after heart surgery   Hemorrhoids    "none since hemorrhoid OR"   History of bronchitis    late 1980's   Hyperlipidemia    takes Pravastatin daily   Hypertension    takes Metoprolol daily   Mitral valve prolapse    was told this in the late 1990's; ;then with Dr.Mclean he states that she doesn't   Myocardial infarction (HCC) 11/2010   Palpitations 2003   hx of; Holter in 2003 with PACs and PVCs   Pericarditis 1980   hx of   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   DR.OWEN   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 in setting of CDiff colitis   Seizures (HCC)    "w/verapimil"   Surgical wound infection     Pseudomonas; wound VAC; secondary intention   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   UC (ulcerative colitis) [facility])    Past Surgical History:  Procedure Laterality Date   ABDOMINAL HYSTERECTOMY     partial hysterectomy   APPENDECTOMY     as a child   BOTOX INJECTION  12/03/2015   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   CARDIAC CATHETERIZATION  11/21/10   CARDIOVERSION N/A 10/10/2022   Procedure: CARDIOVERSION;  Surgeon: Parke Poisson, MD;  Location: [facility] INVASIVE CV LAB;  Service: Cardiovascular;  Laterality: N/A;   CATARACT EXTRACTION, BILATERAL     CHOLECYSTECTOMY N/A 01/25/2016   Procedure: LAPAROSCOPIC CHOLECYSTECTOMY WITH INTRAOPERATIVE CHOLANGIOGRAM;  Surgeon: Gaynelle Adu, MD;  Location: [facility] ORS;  Service: General;  Laterality: N/A;   COLONOSCOPY     COLOSTOMY RECONNECT     colostomy reversal  06/27/11   CORONARY ANGIOPLASTY WITH STENT PLACEMENT     CORONARY ARTERY BYPASS GRAFT  11-25-2010   CABG X3; LIMA to LAD, SVG to PDA, SVG to OM, EVH via right thigh   ESOPHAGOGASTRODUODENOSCOPY     FLEXIBLE SIGMOIDOSCOPY  11/09/2011   Procedure: FLEXIBLE SIGMOIDOSCOPY;  Surgeon: Rachael Fee, MD;  Location: [facility] ENDOSCOPY;  Service: Endoscopy;  Laterality: N/A;   HEMORRHOID SURGERY  ~ 2005   x 2   OOPHORECTOMY  2005   left; w/"mass removal"   PROCTOSCOPY  06/27/2011   Procedure: PROCTOSCOPY;  Surgeon: Atilano Ina, MD;  Location: [facility] OR;  Service: General;  Laterality: N/A;   SIGMOIDECTOMY  12/18/2010   with end colostomy   TUBAL LIGATION     Social History   Social History Narrative   Right handed   Lives with husband one story home   family history includes Coronary artery disease in her mother; Heart attack (age of onset: 60) in her father; Heart disease in her father and mother.   Review of Systems See HPI  Objective:   Physical Exam @BP  120/62   Pulse 62   Ht 5\' 6"  (1.676 m)   Wt 130  lb (59 kg)   BMI 20.98 kg/m @  General:  NAD Eyes:   anicteric Lungs:   clear Heart::  S1S2 no rubs, murmurs or gallops Abdomen:  soft and nontender, BS+ Ext:   no edema, cyanosis or clubbing    Data Reviewed:  See HPI

## 2022-11-17 NOTE — Patient Instructions (Addendum)
_______________________________________________________  If your blood pressure at your visit was 140/90 or greater, please contact your primary care physician to follow up on this.  _______________________________________________________  If you are age 80 or older, your body mass index should be between 23-30. Your Body mass index is 20.98 kg/m. If this is out of the aforementioned range listed, please consider follow up with your Primary Care Provider.  If you are age 67 or younger, your body mass index should be between 19-25. Your Body mass index is 20.98 kg/m. If this is out of the aformentioned range listed, please consider follow up with your Primary Care Provider.   ________________________________________________________  The [place] [facility] providers would like to encourage you to use [facility] to communicate with providers for non-urgent requests or questions.  Due to long hold times on the telephone, sending your provider a message by [facility] may be a faster and more efficient way to get a response.  Please allow 48 business hours for a response.  Please remember that this is for non-urgent requests.  _______________________________________________________  Your provider has requested that you go to the basement level for lab work before leaving today. Press "B" on the elevator. The lab is located at the first door on the left as you exit the elevator.  Your provider has requested you have an xray today. Go to the basement for this  Due to recent changes in healthcare laws, you may see the results of your imaging and laboratory studies on MyChart before your provider has had a chance to review them.  We understand that in some cases there may be results that are confusing or concerning to you. Not all laboratory results come back in the same time frame and the provider may be waiting for multiple results in order to interpret others.  Please give [place] 48 hours in order for your provider to  thoroughly review all the results before contacting the office for clarification of your results.   It was a pleasure to see you today!  Thank you for trusting me with your gastrointestinal care!

## 2022-11-20 ENCOUNTER — Telehealth: Payer: Self-pay | Admitting: Internal Medicine

## 2022-11-20 MED ORDER — MESALAMINE ER 0.375 G PO CP24: 750.0000 mg | ORAL_CAPSULE | Freq: Every day | ORAL | 3 refills | Status: DC

## 2022-11-20 NOTE — Telephone Encounter (Signed)
Light-headed at current dose of mesalamine No more diarrhea   I was actually contacting as I had rxed 5 caps a day and full dose is 4  She wants to try 2 caps a day which had helped in past w/o side effects  Will do that  I will contact [facility] in [place] (had to go there to get Rx) and amend  I reviewed KUB - looks negative to me (re: obstruction) - will f/u official read also

## 2022-11-20 NOTE — Telephone Encounter (Signed)
This is a [name] patient. Will route accordingly. Thanks

## 2022-11-20 NOTE — Telephone Encounter (Signed)
Inbound call from patient requesting a call back regarding mesalamine medication. States she has not been feeling well since she started taking medication and has some concerns she would like to discuss further. Please advise, thank you.

## 2022-11-27 ENCOUNTER — Telehealth: Payer: Self-pay | Admitting: Internal Medicine

## 2022-11-27 NOTE — Telephone Encounter (Addendum)
Message left for her to call back and let me know how she is doing re: diarrhea  Also that I have nor received labs from ED visit in [place] - I think she needs a CBC again to recheck hemoglobin as it has been high

## 2022-11-28 ENCOUNTER — Other Ambulatory Visit: Payer: Self-pay

## 2022-11-28 DIAGNOSIS — D582 Other hemoglobinopathies: Secondary | ICD-10-CM

## 2022-11-28 NOTE — Telephone Encounter (Signed)
Inbound call from patient stating she is doing well and currently not having any problems. Please advise, thank you.

## 2022-11-28 NOTE — Telephone Encounter (Signed)
Pt was contacted: Pt stated that she is doing good and is not having any more diarrhea: Pt was notified of [name] recommendations to have a CBC drawn due to the increase in her recent hemoglobin.  Lab order placed in Epic. Pt made aware. Pt verbalized understanding with all questions answered.

## 2022-12-13 ENCOUNTER — Other Ambulatory Visit: Payer: Self-pay | Admitting: Cardiology

## 2022-12-13 ENCOUNTER — Other Ambulatory Visit (INDEPENDENT_AMBULATORY_CARE_PROVIDER_SITE_OTHER): Payer: PPO

## 2022-12-13 DIAGNOSIS — D582 Other hemoglobinopathies: Secondary | ICD-10-CM | POA: Diagnosis not present

## 2022-12-13 LAB — CBC WITH DIFFERENTIAL/PLATELET
Basophils Absolute: 0 10*3/uL (ref 0.0–0.1)
Basophils Relative: 1 % (ref 0.0–3.0)
Eosinophils Absolute: 0.2 10*3/uL (ref 0.0–0.7)
Eosinophils Relative: 4.9 % (ref 0.0–5.0)
HCT: 38.9 % (ref 36.0–46.0)
Hemoglobin: 12.5 g/dL (ref 12.0–15.0)
Lymphocytes Relative: 20.6 % (ref 12.0–46.0)
Lymphs Abs: 0.9 10*3/uL (ref 0.7–4.0)
MCHC: 32.1 g/dL (ref 30.0–36.0)
MCV: 90.1 fl (ref 78.0–100.0)
Monocytes Absolute: 0.3 10*3/uL (ref 0.1–1.0)
Monocytes Relative: 7.9 % (ref 3.0–12.0)
Neutro Abs: 2.9 10*3/uL (ref 1.4–7.7)
Neutrophils Relative %: 65.6 % (ref 43.0–77.0)
Platelets: 151 10*3/uL (ref 150.0–400.0)
RBC: 4.32 Mil/uL (ref 3.87–5.11)
RDW: 15.5 % (ref 11.5–15.5)
WBC: 4.4 10*3/uL (ref 4.0–10.5)

## 2022-12-20 NOTE — Progress Notes (Signed)
Cardiology Clinic Note   Date: 12/21/2022 ID: Sereen, Schnebly [birth year], MRN [id number]  Primary Cardiologist:  Peter Swaziland, MD  Patient Profile    [name] is a 82 y.o. female who presents to the clinic today for routine follow up.     Past medical history significant for: CAD. LHC 11/21/2010 (NSTEMI): Three-vessel CAD.  Given diffuse nature of disease CTS consult. CABG x 3 11/25/2010: LIMA to LAD, SVG to PDA, SVG to OM1.  OM1 endarterectomy LHC 12/24/2019 (unstable angina) performed at [facility]: Patent grafts. PCI with DES 2.75 x 12 mm to proximal LCx.  Nuclear stress test 10/07/2020: Normal, low risk study with no ischemia or infarction. Echo 10/10/2022: EF 55 to 60%.  No RWMA.  Indeterminate diastolic parameters.  Mildly reduced RV function.  Mild RAE.  Mild MR.  Aortic valve sclerosis without stenosis. PAF/a-flutter. 14-day ZIO 10/28/2020: NSR.  Rare PACs with runs of SVT/PAT, longest 19 seconds.  Rare PVCs with infrequent NSVT 4-6 beats versus aberrancy.  PAF rate 73 to 164 bpm, mean 124 bpm, <1%.  Min HR 31 bpm. DCCV 10/10/2022: Postprocedure EKG sinus bradycardia. Carotid artery disease. Carotid duplex 07/14/2022: Right ICA 40 to 59%.  Left ICA 1 to 39%. Hypertension. Hyperlipidemia. Lipid panel 09/20/2021: LDL 39, HDL 77, TG 90, total 133. Ulcerative colitis.     History of Present Illness    [name] is a longtime patient of cardiology followed by Dr. Swaziland and Dr. Ladona Ridgel. Patient has a history of CAD for which she underwent CABG x 15 November 2010 and PCI with DES to proximal LCx August 2021.  She has a history of PAF with event monitoring June 2022 showing a low burden of A-fib and she was started on Eliquis.  Carotid artery disease is followed by vascular surgery.  Patient was last seen in the office by Reather Littler, NP on 10/17/2022 for hospital follow-up.  Patient underwent hospital admission on 10/09/2022 for rapid a-flutter associated with  chest discomfort.  Overall ED labs were unremarkable with the exception of slightly elevated BNP at 288.2.  She was reloaded with IV amiodarone and underwent successful cardioversion.  Echo showed normal LV function.  She was discharged on amiodarone 100 mg daily.  At the time of her follow-up visit she reported continued weakness.  She was unable to tolerate the amiodarone at 100 mg secondary to weakness and bradycardia to 42 bpm so she decreased back to 50 mg daily (dose she was taking prior to hospital admission).  She reported continued intermittent sharp chest pain in the middle of her chest that lasted less than 2 seconds and was not associated with exertion.  Patient felt this has been ongoing for some time.  EKG showed sinus bradycardia at 42 bpm.  She was instructed to follow-up with EP.  Patient was evaluated by Dr. Lalla Brothers on 10/20/2022 for bradycardia.  She was instructed to continue amiodarone at 50 mg daily.  It was not felt there was any indication for permanent pacing at that time.  Today, patient is doing well. Patient denies shortness of breath or dyspnea on exertion. No chest pain, pressure, or tightness. Denies lower extremity edema, orthopnea, or PND. She gets an occasional pounding sensation that lasts <1 minute but no sustained palpitations. Patient has been walking 2-3 miles a day. SBP at home normally runs in the 120s. HR normally in the 50s. She denies blood in urine or stool or other bleeding concerns.  ROS: All other systems reviewed and are otherwise negative except as noted in History of Present Illness.  Studies Reviewed      EKG is not ordered today.   Risk Assessment/Calculations     CHA2DS2-VASc Score = 5   This indicates a 7.2% annual risk of stroke. The patient's score is based upon: CHF History: 0 HTN History: 1 Diabetes History: 0 Stroke History: 0 Vascular Disease History: 1 Age Score: 2 Gender Score: 1             Physical Exam    VS:  BP  (!) 142/68   Pulse (!) 56   Ht 5\' 6"  (1.676 m)   Wt 131 lb (59.4 kg)   SpO2 97%   BMI 21.14 kg/m  , BMI Body mass index is 21.14 kg/m.  GEN: Well nourished, well developed, in no acute distress. Neck: No JVD or carotid bruits. Cardiac:  RRR. No murmurs. No rubs or gallops.   Respiratory:  Respirations regular and unlabored. Clear to auscultation without rales, wheezing or rhonchi. GI: Soft, nontender, nondistended. Extremities: Radials/DP/PT 2+ and equal bilaterally. No clubbing or cyanosis. No edema.  Skin: Warm and dry, no rash. Neuro: Strength intact.  Assessment & Plan    CAD.  S/p CABG x 15 November 2010, PCI with DES to proximal LCx August 2021. Patient denies chest pain, tightness, or pressure. She has been walking  2-3 miles a day. Continue Repatha.  Not on aspirin secondary to Eliquis.  No beta-blocker secondary to bradycardia. PAF/A-flutter.  S/p DCCV May 2024. Patient reports occasional pounding sensation lasting <1 minute but no sustained palpitations. Denies spontaneous bleeding concerns. RRR on exam today. HR 56 bpm. Normally in the 50s at home. Continue amiodarone 50 mg daily, Eliquis.  Hypertension: BP today 124/68 on intake and 136/60 on my recheck. Home SBP normally in the 120s. Patient denies headaches, dizziness or vision changes. Continue losartan. Hyperlipidemia.  LDL May 2023 39, at goal.  Continue Repatha.  Disposition: Return for previously scheduled visit with Dr. Swaziland on 02/19/2023         Signed, Etta Grandchild. Anusha Claus, DNP, NP-C

## 2022-12-21 ENCOUNTER — Ambulatory Visit: Payer: PPO | Admitting: Student

## 2022-12-21 ENCOUNTER — Encounter: Payer: Self-pay | Admitting: Student

## 2022-12-21 VITALS — BP 136/60 | HR 56 | Ht 66.0 in | Wt 131.0 lb

## 2022-12-21 DIAGNOSIS — I10 Essential (primary) hypertension: Secondary | ICD-10-CM | POA: Diagnosis not present

## 2022-12-21 DIAGNOSIS — I4892 Unspecified atrial flutter: Secondary | ICD-10-CM

## 2022-12-21 DIAGNOSIS — I2510 Atherosclerotic heart disease of native coronary artery without angina pectoris: Secondary | ICD-10-CM | POA: Diagnosis not present

## 2022-12-21 DIAGNOSIS — E785 Hyperlipidemia, unspecified: Secondary | ICD-10-CM | POA: Diagnosis not present

## 2022-12-21 DIAGNOSIS — I480 Paroxysmal atrial fibrillation: Secondary | ICD-10-CM | POA: Diagnosis not present

## 2022-12-21 NOTE — Patient Instructions (Signed)
Medication Instructions:  No changes. If you need a refill on your cardiac medications before your next appointment, please call your pharmacy.   Lab Work: No changes If you have labs (blood work) drawn today and your tests are completely normal, you will receive your results only by: MyChart Message (if you have MyChart) OR A paper copy in the mail If you have any lab test that is abnormal or we need to change your treatment, we will call you to review the results.   Testing/Procedures: none   Follow-Up: At [facility], you and your health needs are our priority.  As part of our continuing mission to provide you with exceptional heart care, we have created designated Provider Care Teams.  These Care Teams include your primary Cardiologist (physician) and Advanced Practice Providers (APPs -  Physician Assistants and Nurse Practitioners) who all work together to provide you with the care you need, when you need it.  We recommend signing up for the patient portal called "MyChart".  Sign up information is provided on this After Visit Summary.  MyChart is used to connect with patients for Virtual Visits (Telemedicine).  Patients are able to view lab/test results, encounter notes, upcoming appointments, etc.  Non-urgent messages can be sent to your provider as well.   To learn more about what you can do with MyChart, go to [email or web address].    Your next appointment:    Keep scheduled appointment  Provider:   Peter Swaziland, MD

## 2022-12-28 ENCOUNTER — Other Ambulatory Visit (HOSPITAL_BASED_OUTPATIENT_CLINIC_OR_DEPARTMENT_OTHER): Payer: Self-pay | Admitting: Cardiology

## 2023-01-25 DIAGNOSIS — J40 Bronchitis, not specified as acute or chronic: Secondary | ICD-10-CM | POA: Diagnosis not present

## 2023-01-25 DIAGNOSIS — Z6821 Body mass index (BMI) 21.0-21.9, adult: Secondary | ICD-10-CM | POA: Diagnosis not present

## 2023-01-25 DIAGNOSIS — J329 Chronic sinusitis, unspecified: Secondary | ICD-10-CM | POA: Diagnosis not present

## 2023-02-14 NOTE — Progress Notes (Unsigned)
Date:  02/19/2023   ID:  Dominique, Terry 11/17/1940, MRN [id number]   PCP:  Noni Saupe, MD  Cardiologist:  Arleatha Philipps [place], MD  Electrophysiologist:  None   Evaluation Performed:  Follow-Up Visit  Chief Complaint:  CAD  History of Present Illness:    [name] is a 82 y.o. 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 Cornelius Moras (LIMA-LAD, SVG-OM, SVG-PDA), postop atrial fibrillation, spastic dysphonia, carotid artery disease, hypertension and hyperlipidemia.  Cardiopulmonary stress test in 2017 showed no cardiac or ventilatory limitation.  ETT March 2018 showed hypertensive response.  Previous Myoview in April 2018 was low risk but no ischemia or scar.  She is intolerant of Crestor.  Patient was seen on 07/08/2018 at which time she complained of increasing dyspnea on exertion going up hills.  This is similar to her previous symptoms prior to CABG.  Echocardiogram obtained on 07/15/2018 revealed EF 60 to 65%, mild AI, moderately elevated RV pressure. Repeat Myoview was performed on 07/19/2018 which showed EF 67%, normal perfusion.  Her dyspnea on exertion was felt to be related to diastolic heart failure and she was placed on low-dose Lasix and potassium supplement.  Losartan was increased to 100 mg daily.  She was seen in our lipid clinic in June. Options for further lipid management were discussed. She was started on Repatha. She has been compliant with this.   She presented to [facility] ED on December 24, 2019 with unstable angina.  Subsequent troponins were normal. Transferred to [facility] in [place]. Underwent cardiac cath showing patent bypass grafts. This included a LIMA to the LAD, SVG to PDA and apparently SVG to diagonal (instead of OM as noted in op report).  She underwent PCI of the proximal LCx with a 2.75 x 12 mm Synergy stent. Echo was unremarkable except for moderate TR. She was started on Plavix.  I reviewed films from her cardiac cath in [facility]. She has patent LIMA to the LAD, SVG to OM1, and SVG to PDA. There was a severe stenosis in the proximal LCx that supplies the second and third OMs distally. This stenosis was successfully stented. The mid to distal LCx has moderate diffuse disease.   She was seen 01/05/21 with intermittent palpitations.  Event monitor showed some NSVT, nonsustained SVT, atrial fibrillation- low burden. Placed on Eliquis.   Hospitalized 01/2021 with atrial flutter with RVR at [facility].  She was started on amiodarone drip and converted to NSR.  She was then transitioned to p.o. amiodarone. CT chest angiography 01/31/21 normal heart size, evidence of CABG, no evidence of aortic dissection, few tree-in-bud opacities and centrilobular nodules in right upper lobe suggesting infectious/inflammatory bronchioloitis, marked celiac artery origin narrowing, ventral abdominal wall laxity with superimposed hernia containing nonobstructed small bowel loops. Her chest discomfort was though to be due to arrhythmia as troponin x3 unremarkable.   She was evaluated by Dr Ladona Ridgel in February for bradycardia. Has some Sinus node dysfunction. Bradycardia improved on lower dose of amiodarone. For now watching closely. If increased arrhythmia may need higher amiodarone dose in which case she may need a pacemaker.   She was admitted 10/09/22 for rapid atrial flutter associated with chest discomfort, noted compliance with Amiodarone 50mg  daily. She had been maintained with a lower dose for history of bradycardia. In the ED her labs were unremarkable, though BNP was elevated at 288.2. Initially she was reloaded with IV amiodarone. She then underwent  a successful DCCV with return to sinus rhythm. Echo showed EF 55 to 60%, LV had normal function. Discharged on amiodarone 100mg  daily and continued Eliquis. She was seen by Dr Lalla Brothers in June. Noted some HR in 40s. No indication for pacemaker at that time and continued monitoring. Was seen in the  office in August and doing well at that time.   On follow up today she is seen with her husband. She is feeling well. Walking 2-3 miles a day. No recurrent tachycardia. HR usually in the 40s to 52. No dizziness, syncope, dyspnea. Rare fleeting chest pain only lasting a few seconds.    Past Medical History:  Diagnosis Date   Anemia    "in my early teens"   Arthritis    neck and back   Atrial fibrillation (HCC)    post-op after CABG;  treated with Amiodarone and coumadin until readmxn with CDiff colitis, perf colon and elevated LFTs (amio and coumadin stopped)   Atrial flutter with rapid ventricular response (HCC) 10/09/2022   C. difficile colitis 12/2010   complicated post CABG course with prolonged admission, perforated sigmoid colon;  s/p sigmoid colectomy with end colostomy [name] procedure);  laparotomy wound infection with Pseudomonas treated with antibxs and secondary intention   Chronic kidney disease    hx kidney stones   Coronary artery disease    a. NSTEMI 7/12, cath: pLAD 30%, mLAD 60-70%, dLAD 80%, oOM2 50%, mOM2 40%, trifurcation 80%, pRCA 25%, then occluded, L-R collats, EF 55%;   b. 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 Cornelius Moras) // c. ETT 3/18: inf-lat ST depression of 1 mm at 4 min of exercise, frequent ectopy and hypertensive BP response // d. 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   GERD (gastroesophageal reflux disease)    H/O hiatal hernia    "slight; not having problems with it"   Hair loss    after heart surgery   Hemorrhoids    "none since hemorrhoid OR"   History of bronchitis    late 1980's   Hyperlipidemia    takes Pravastatin daily   Hypertension    takes Metoprolol daily   Mitral valve prolapse    was told this in the late 1990's; ;then with Dr.Mclean he states that she doesn't   Myocardial infarction (HCC) 11/2010   Palpitations 2003   hx of; Holter in 2003 with PACs and PVCs    Pericarditis 1980   hx of   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   DR.OWEN   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 in setting of CDiff colitis   Seizures (HCC)    "w/verapimil"   Surgical wound infection    Pseudomonas; wound VAC; secondary intention   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   UC (ulcerative colitis) [facility])    Past Surgical History:  Procedure Laterality Date   ABDOMINAL HYSTERECTOMY     partial hysterectomy   APPENDECTOMY     as a child   BOTOX INJECTION  12/03/2015   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   CARDIAC CATHETERIZATION  11/21/10   CARDIOVERSION N/A 10/10/2022   Procedure: CARDIOVERSION;  Surgeon: Parke Poisson, MD;  Location: [facility] INVASIVE CV LAB;  Service: Cardiovascular;  Laterality: N/A;   CATARACT EXTRACTION, BILATERAL     CHOLECYSTECTOMY N/A 01/25/2016   Procedure: LAPAROSCOPIC CHOLECYSTECTOMY WITH INTRAOPERATIVE CHOLANGIOGRAM;  Surgeon: Gaynelle Adu,  MD;  Location: [facility] ORS;  Service: General;  Laterality: N/A;   COLONOSCOPY     COLOSTOMY RECONNECT     colostomy reversal  06/27/11   CORONARY ANGIOPLASTY WITH STENT PLACEMENT     CORONARY ARTERY BYPASS GRAFT  11-25-2010   CABG X3; LIMA to LAD, SVG to PDA, SVG to OM, EVH via right thigh   ESOPHAGOGASTRODUODENOSCOPY     FLEXIBLE SIGMOIDOSCOPY  11/09/2011   Procedure: FLEXIBLE SIGMOIDOSCOPY;  Surgeon: Rachael Fee, MD;  Location: [facility] ENDOSCOPY;  Service: Endoscopy;  Laterality: N/A;   HEMORRHOID SURGERY  ~ 2005   x 2   OOPHORECTOMY  2005   left; w/"mass removal"   PROCTOSCOPY  06/27/2011   Procedure: PROCTOSCOPY;  Surgeon: Atilano Ina, MD;  Location: [facility] OR;  Service: General;  Laterality: N/A;   SIGMOIDECTOMY  12/18/2010   with end colostomy   TUBAL LIGATION       Current Meds  Medication Sig   amiodarone (PACERONE) 100 MG tablet Take 1/2 (one-half) tablet by mouth once daily   apixaban (ELIQUIS) 5 MG TABS tablet Take 1 tablet by mouth twice daily   ascorbic acid (VITAMIN C) 1000 MG  tablet Take 1,000 mg by mouth daily.   Bacillus Coagulans-Inulin (PROBIOTIC-PREBIOTIC) 1-250 BILLION-MG CAPS Take 1 capsule by mouth daily.   Evolocumab (REPATHA SURECLICK) 140 MG/ML SOAJ INJECT 140 MG SUBCUTANEOUSLY EVERY 14 DAYS   losartan (COZAAR) 50 MG tablet Take 1 tablet by mouth once daily (Patient taking differently: Take 50 mg by mouth daily.)   mesalamine (APRISO) 0.375 g 24 hr capsule Take 2 capsules (0.75 g total) by mouth daily.   Multiple Vitamin (MULTI-VITAMIN) tablet Take 1 tablet by mouth daily.     Allergies:   Apple, Nadolol, Sulfa antibiotics, Sulfamethoxazole, Sulfonamide derivatives, Verapamil, Crestor [rosuvastatin], and Flagyl [metronidazole]   Social History   Tobacco Use   Smoking status: Never    Passive exposure: Never   Smokeless tobacco: Never  Vaping Use   Vaping status: Never Used  Substance Use Topics   Alcohol use: No   Drug use: No     Family Hx: The patient's family history includes Coronary artery disease in her mother; Heart attack (age of onset: 57) in her father; Heart disease in her father and mother. There is no history of Anesthesia problems, Hypotension, Malignant hyperthermia, Pseudochol deficiency, Colon cancer, Esophageal cancer, Stomach cancer, or Rectal cancer.  ROS:   Please see the history of present illness.    All other systems reviewed and are negative.   Prior CV studies:   The following studies were reviewed today:  Echo 07/17/18: IMPRESSIONS      1. The left ventricle has normal systolic function with an ejection fraction of 60-65%. The cavity size was normal. Left ventricular diastolic Doppler parameters are consistent with pseudonormalization Elevated left atrial and left ventricular  end-diastolic pressures.  2. The right ventricle has normal systolic function. The cavity was normal. There is no increase in right ventricular wall thickness. Right ventricular systolic pressure is moderately elevated with an estimated  pressure of 43.7 mmHg.  3. The mitral valve is degenerative. Moderate thickening of the mitral valve leaflets with moderate involvement of the chordae tendinae. Mild calcification of the mitral valve leaflet.  4. The tricuspid valve is normal in structure.  5. The aortic valve is tricuspid Aortic valve regurgitation is mild by color flow Doppler.  6. The pulmonic valve was normal in structure.  7. The inferior vena cava was  dilated in size with <50% respiratory variability.  8. The interatrial septum appears to be lipomatous.   Myoview 07/19/2018 Study Highlights      The left ventricular ejection fraction is hyperdynamic (>65%). Nuclear stress EF: 67%. No T wave inversion was noted during stress. There was no ST segment deviation noted during stress. The study is normal. This is a low risk study.   Normal perfusion. LVEF 67% with normal wall motion. This is a low risk study. No change compared to prior study in 2018.   Myoview 10/07/20: Study Highlights    The left ventricular ejection fraction is normal (55-65%). Nuclear stress EF: 62%. Blood pressure demonstrated a normal response to exercise. ST segment depression was noted during stress in the II, III, aVF, V5 and V6 leads. The study is normal. This is a low risk study.   Normal stress nuclear study with no ischemia or infarction.  Gated ejection fraction 62% with normal wall motion.  Patient did complain of atypical chest pain during the study and there was note of ST depression in the inferolateral leads.   Event monitor 10/28/20: Study Highlights    Normal sinus rhythm Rare PACs with runs of SVT/PAT. longest lasting 19 seconds Rare PVCs with infrequent NSVT 4-6 beats versus aberrancy Paroxysmal Afib with rate 73-164. mean 124 bpm. burden is low at < 1%. Minimal HR 31 at 7:29 am. Unclear if this is post termination.  Echo 10/10/22: IMPRESSIONS     1. Left ventricular ejection fraction, by estimation, is 55 to 60%. The   left ventricle has normal function. The left ventricle has no regional  wall motion abnormalities. Left ventricular diastolic parameters are  indeterminate.   2. Right ventricular systolic function is mildly reduced. The right  ventricular size is normal. There is normal pulmonary artery systolic  pressure. The estimated right ventricular systolic pressure is 34.4 mmHg.   3. Right atrial size was mildly dilated.   4. The mitral valve is grossly normal. Mild mitral valve regurgitation.  No evidence of mitral stenosis.   5. The aortic valve is tricuspid. Aortic valve regurgitation is trivial.  Aortic valve sclerosis is present, with no evidence of aortic valve  stenosis.   6. The inferior vena cava is normal in size with greater than 50%  respiratory variability, suggesting right atrial pressure of 3 mmHg.    Labs/Other Tests and Data Reviewed:    EKG:  none today    Recent Labs: 10/09/2022: ALT 19; B Natriuretic Peptide 288.2; BUN 23; Creatinine, Ser 1.00; Potassium 4.6; Sodium 141; TSH 3.623 12/13/2022: Hemoglobin 12.5; Platelets 151.0   Recent Lipid Panel Lab Results  Component Value Date/Time   CHOL 133 09/20/2021 09:51 AM   TRIG 90 09/20/2021 09:51 AM   HDL 77 09/20/2021 09:51 AM   CHOLHDL 1.7 09/20/2021 09:51 AM   CHOLHDL 3 04/13/2014 09:34 AM   LDLCALC 39 09/20/2021 09:51 AM   LDLDIRECT 81.8 07/20/2011 03:04 PM   Dated 07/02/18: cholesterol 161, triglycerides 81, HDL 52, LDL 93. CMET and TSH normal  Dated 12/24/19: BNP 300. Glucose 144, BUN 22, otherwise CMET normal. plts 132K. WBC 3.2K.  Dated 07/18/22: cholesterol 151, triglycerides 141, HDL 74, LDL 49.   Wt Readings from Last 3 Encounters:  02/19/23 132 lb 9.6 oz (60.1 kg)  12/21/22 131 lb (59.4 kg)  11/17/22 130 lb (59 kg)     Objective:    Vital Signs:  BP 130/64 (BP Location: Left Arm, Patient Position: Sitting, Cuff Size: Normal)  Ht 5\' 6"  (1.676 m)   Wt 132 lb 9.6 oz (60.1 kg)   BMI 21.40 kg/m     GENERAL:  Well appearing WF in NAD HEENT:  PERRL, EOMI, sclera are clear. Oropharynx is clear. NECK:  No jugular venous distention, carotid upstroke brisk and symmetric, no bruits, no thyromegaly or adenopathy LUNGS:  Clear to auscultation bilaterally CHEST:  Unremarkable HEART:  RRR,  PMI not displaced or sustained,S1 and S2 within normal limits, no S3, no S4: no clicks, no rubs, no murmurs ABD:  Soft, nontender. BS +, no masses or bruits. No hepatomegaly, no splenomegaly EXT:  2 + pulses throughout, no edema, no cyanosis no clubbing. She has some bruising at her right groin site but no hematoma. SKIN:  Warm and dry.  No rashes NEURO:  Alert and oriented x 3. Cranial nerves II through XII intact. PSYCH:  Cognitively intact    ASSESSMENT & PLAN:    1.  CAD s/p CABG x 3 in 2012 for multivessel CAD. Normal Myoview in May 2020. Had  unstable angina in August 2021. S/p DES of the proximal LCx. Patent grafts.  No longer on antiplatelet therapy since on Eliquis.  Repeat Myoview in May 2022 was normal.    2. Hypercholesterolemia. intolerant of statins. Now on Repatha with excellent response. Will update labs today   3. Carotid arterial disease. 60-79% RICA stenosis.  Last check in March. Repeat yearly   4. HTN controlled.   5.  Paroxysmal afib/SVT/ sinus node dysfunction.   Now anticoagulated. Last episode of arrhythmia in May with DCCV. Not a candidate for beta blocker. Continue low dose amiodarone. Followed by EP  6. Chronic diastolic dysfunction. Off diuretics now and euvolemic      Medication Adjustments/Labs and Tests Ordered: Current medicines are reviewed at length with the patient today.  Concerns regarding medicines are outlined above.   Tests Ordered: Orders Placed This Encounter  Procedures   Comprehensive Metabolic Panel (CMET)   Lipid panel     Medication Changes: No orders of the defined types were placed in this encounter.    Follow Up:  6  months   Signed, Brin Ruggerio [place], MD  02/19/2023 8:44 AM     [facility]  Addendum    Goku Harb [place] MD, [facility]

## 2023-02-19 ENCOUNTER — Encounter: Payer: Self-pay | Admitting: Cardiology

## 2023-02-19 ENCOUNTER — Ambulatory Visit: Payer: PPO | Attending: Cardiology | Admitting: Cardiology

## 2023-02-19 VITALS — BP 130/64 | Ht 66.0 in | Wt 132.6 lb

## 2023-02-19 DIAGNOSIS — I2510 Atherosclerotic heart disease of native coronary artery without angina pectoris: Secondary | ICD-10-CM | POA: Diagnosis not present

## 2023-02-19 DIAGNOSIS — E785 Hyperlipidemia, unspecified: Secondary | ICD-10-CM

## 2023-02-19 DIAGNOSIS — I480 Paroxysmal atrial fibrillation: Secondary | ICD-10-CM | POA: Diagnosis not present

## 2023-02-19 DIAGNOSIS — I10 Essential (primary) hypertension: Secondary | ICD-10-CM

## 2023-02-19 NOTE — Patient Instructions (Signed)
Medication Instructions:  Continue all medications *If you need a refill on your cardiac medications before your next appointment, please call your pharmacy*   Lab Work: Cmet,lipid panel today   Testing/Procedures: None ordered   Follow-Up: At [facility], you and your health needs are our priority.  As part of our continuing mission to provide you with exceptional heart care, we have created designated Provider Care Teams.  These Care Teams include your primary Cardiologist (physician) and Advanced Practice Providers (APPs -  Physician Assistants and Nurse Practitioners) who all work together to provide you with the care you need, when you need it.  We recommend signing up for the patient portal called "MyChart".  Sign up information is provided on this After Visit Summary.  MyChart is used to connect with patients for Virtual Visits (Telemedicine).  Patients are able to view lab/test results, encounter notes, upcoming appointments, etc.  Non-urgent messages can be sent to your provider as well.   To learn more about what you can do with MyChart, go to [email or web address].    Your next appointment:  6 months     Call in Jan to schedule April appointment     Provider:  Dr.Jordan

## 2023-02-20 LAB — COMPREHENSIVE METABOLIC PANEL WITH GFR
ALT: 11 [IU]/L (ref 0–32)
AST: 23 [IU]/L (ref 0–40)
Albumin: 4.5 g/dL (ref 3.8–4.8)
Alkaline Phosphatase: 65 [IU]/L (ref 44–121)
BUN/Creatinine Ratio: 17 (ref 12–28)
BUN: 17 mg/dL (ref 8–27)
Bilirubin Total: 0.5 mg/dL (ref 0.0–1.2)
CO2: 25 mmol/L (ref 20–29)
Calcium: 9.5 mg/dL (ref 8.7–10.3)
Chloride: 105 mmol/L (ref 96–106)
Creatinine, Ser: 1.03 mg/dL — ABNORMAL HIGH (ref 0.57–1.00)
Globulin, Total: 2.5 g/dL (ref 1.5–4.5)
Glucose: 91 mg/dL (ref 70–99)
Potassium: 4.7 mmol/L (ref 3.5–5.2)
Sodium: 143 mmol/L (ref 134–144)
Total Protein: 7 g/dL (ref 6.0–8.5)
eGFR: 55 mL/min/{1.73_m2} — ABNORMAL LOW

## 2023-02-20 LAB — LIPID PANEL
Chol/HDL Ratio: 1.9 ratio (ref 0.0–4.4)
Cholesterol, Total: 164 mg/dL (ref 100–199)
HDL: 85 mg/dL
LDL Chol Calc (NIH): 65 mg/dL (ref 0–99)
Triglycerides: 74 mg/dL (ref 0–149)
VLDL Cholesterol Cal: 14 mg/dL (ref 5–40)

## 2023-03-06 DIAGNOSIS — Z23 Encounter for immunization: Secondary | ICD-10-CM | POA: Diagnosis not present

## 2023-03-22 ENCOUNTER — Other Ambulatory Visit: Payer: Self-pay | Admitting: Cardiology

## 2023-04-20 ENCOUNTER — Other Ambulatory Visit: Payer: Self-pay | Admitting: Cardiology

## 2023-04-20 DIAGNOSIS — I480 Paroxysmal atrial fibrillation: Secondary | ICD-10-CM

## 2023-04-23 NOTE — Telephone Encounter (Signed)
Prescription refill request for Eliquis received. Indication:afib Last office visit:10/24 Scr:1.03  10/24 Age: 82 Weight:60.1  kg  Prescription refilled

## 2023-04-24 ENCOUNTER — Ambulatory Visit: Payer: PPO | Attending: Internal Medicine | Admitting: Internal Medicine

## 2023-04-24 ENCOUNTER — Other Ambulatory Visit: Payer: Self-pay | Admitting: Cardiology

## 2023-04-24 DIAGNOSIS — I480 Paroxysmal atrial fibrillation: Secondary | ICD-10-CM

## 2023-04-24 NOTE — Patient Instructions (Addendum)
Medication Instructions:  Your physician recommends that you continue on your current medications as directed. Please refer to the Current Medication list given to you today.  *If you need a refill on your cardiac medications before your next appointment, please call your pharmacy*  Lab Work: None ordered.  If you have labs (blood work) drawn today and your tests are completely normal, you will receive your results only by: MyChart Message (if you have MyChart) OR A paper copy in the mail If you have any lab test that is abnormal or we need to change your treatment, we will call you to review the results.  Testing/Procedures: Possible dates for Pacemaker: January 2025 - 6,7,21,22,28  Please call [phone] to schedule  Follow-Up: At [facility], you and your health needs are our priority.  As part of our continuing mission to provide you with exceptional heart care, we have created designated Provider Care Teams.  These Care Teams include your primary Cardiologist (physician) and Advanced Practice Providers (APPs -  Physician Assistants and Nurse Practitioners) who all work together to provide you with the care you need, when you need it.   Your next appointment:   To be determined  The format for your next appointment:   In Person  Provider:   Lewayne Bunting, MD{or one of the following Advanced Practice Providers on your designated Care Team:   Francis Dowse, [place] Casimiro Needle "Mardelle Matte" [place], [place] Earnest Rosier, NP  Remote monitoring is used to monitor your Pacemaker/ ICD from home. This monitoring reduces the number of office visits required to check your device to one time per year. It allows [place] to keep an eye on the functioning of your device to ensure it is working properly..  Important Information About Sugar

## 2023-04-24 NOTE — Progress Notes (Addendum)
HPI Mrs. Arista returns today for ongoing evaluation of palpitations and paroxysmal atrial arrhythmias.  The patient has occasional palpitations.  She has a history of spells of bradycardia and when she checks her blood pressure sometimes heart rates in the 40s.  She has improved after an additional reduction in her dose of amiodarone.  In June she wore a cardiac monitor which demonstrated atrial fibrillation.  There is also history of atrial flutter.  My review of her cardiac monitor at that time demonstrates nonsustained atrial tachycardia as well.  The patient also has nocturnal bradycardia with heart rates in the 30s.  She has not had syncope.  She has been placed on systemic anticoagulation without difficulty.  She denies chest pain or shortness of breath.  She has a history of mitral valve repair. Since her last visit, she was hospitalized for DCCV. No recurrent fib/flutter despite very low dose of amiodarone. She gets dizzy when she walks but has not had syncope.  Allergies  Allergen Reactions   Apple Anaphylaxis   Nadolol Hives and Anaphylaxis    Rash and nausea   Sulfa Antibiotics Hives and Anaphylaxis   Sulfamethoxazole Anaphylaxis   Sulfonamide Derivatives Anaphylaxis   Verapamil Other (See Comments) and Anaphylaxis    seizures   Crestor [Rosuvastatin] Other (See Comments)    Myalgias    Flagyl [Metronidazole] Hives     Current Outpatient Medications  Medication Sig Dispense Refill   amiodarone (PACERONE) 100 MG tablet Take 1/2 (one-half) tablet by mouth once daily 45 tablet 3   apixaban (ELIQUIS) 5 MG TABS tablet Take 1 tablet by mouth twice daily 180 tablet 1   ascorbic acid (VITAMIN C) 1000 MG tablet Take 1,000 mg by mouth daily.     Bacillus Coagulans-Inulin (PROBIOTIC-PREBIOTIC) 1-250 BILLION-MG CAPS Take 1 capsule by mouth daily.     Evolocumab (REPATHA SURECLICK) 140 MG/ML SOAJ INJECT 140 MG SUBCUTANEOUSLY EVERY 14 DAYS 2 mL 11   losartan (COZAAR) 50 MG tablet  Take 1 tablet by mouth once daily 90 tablet 3   mesalamine (APRISO) 0.375 g 24 hr capsule Take 2 capsules (0.75 g total) by mouth daily. 180 capsule 3   Multiple Vitamin (MULTI-VITAMIN) tablet Take 1 tablet by mouth daily.     No current facility-administered medications for this visit.     Past Medical History:  Diagnosis Date   Anemia    "in my early teens"   Arthritis    neck and back   Atrial fibrillation (HCC)    post-op after CABG;  treated with Amiodarone and coumadin until readmxn with CDiff colitis, perf colon and elevated LFTs (amio and coumadin stopped)   Atrial flutter with rapid ventricular response (HCC) 10/09/2022   C. difficile colitis 12/2010   complicated post CABG course with prolonged admission, perforated sigmoid colon;  s/p sigmoid colectomy with end colostomy [name] procedure);  laparotomy wound infection with Pseudomonas treated with antibxs and secondary intention   Chronic kidney disease    hx kidney stones   Coronary artery disease    a. NSTEMI 7/12, cath: pLAD 30%, mLAD 60-70%, dLAD 80%, oOM2 50%, mOM2 40%, trifurcation 80%, pRCA 25%, then occluded, L-R collats, EF 55%;   b. 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 Cornelius Moras) // c. ETT 3/18: inf-lat ST depression of 1 mm at 4 min of exercise, frequent ectopy and hypertensive BP response // d. 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   GERD (gastroesophageal reflux disease)    H/O hiatal hernia    "slight; not having problems with it"   Hair loss    after heart surgery   Hemorrhoids    "none since hemorrhoid OR"   History of bronchitis    late 1980's   Hyperlipidemia    takes Pravastatin daily   Hypertension    takes Metoprolol daily   Mitral valve prolapse    was told this in the late 1990's; ;then with Dr.Mclean he states that she doesn't   Myocardial infarction (HCC) 11/2010   Palpitations 2003   hx of; Holter in 2003 with PACs  and PVCs   Pericarditis 1980   hx of   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   Past Surgical History:  Procedure Laterality Date   ABDOMINAL HYSTERECTOMY     partial hysterectomy   APPENDECTOMY     as a child   BOTOX INJECTION  12/03/2015   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   CARDIAC CATHETERIZATION  11/21/10   CARDIOVERSION N/A 10/10/2022   Procedure: CARDIOVERSION;  Surgeon: Parke Poisson, MD;  Location: [facility];  Service: Cardiovascular;  Laterality: N/A;   CATARACT EXTRACTION, BILATERAL     CHOLECYSTECTOMY N/A 01/25/2016   Procedure: LAPAROSCOPIC CHOLECYSTECTOMY WITH INTRAOPERATIVE CHOLANGIOGRAM;  Surgeon: Gaynelle Adu, MD;  Location: [facility];  Service: General;  Laterality: N/A;   COLONOSCOPY     COLOSTOMY RECONNECT     colostomy reversal  06/27/11   CORONARY ANGIOPLASTY WITH STENT PLACEMENT     CORONARY ARTERY BYPASS GRAFT  11-25-2010   CABG X3; LIMA to LAD, SVG to PDA, SVG to OM, EVH via right thigh   ESOPHAGOGASTRODUODENOSCOPY     FLEXIBLE SIGMOIDOSCOPY  11/09/2011   Procedure: FLEXIBLE SIGMOIDOSCOPY;  Surgeon: Rachael Fee, MD;  Location: [facility];  Service: Endoscopy;  Laterality: N/A;   HEMORRHOID SURGERY  ~ 2005   x 2   OOPHORECTOMY  2005   left; w/"mass removal"   PROCTOSCOPY  06/27/2011   Procedure: PROCTOSCOPY;  Surgeon: Atilano Ina, MD;  Location: [facility];  Service: General;  Laterality: N/A;   SIGMOIDECTOMY  12/18/2010   with end colostomy   TUBAL LIGATION       Family History  Problem Relation Age of Onset   Heart attack Father 35   Heart disease Father        massive heart attack   Coronary  artery disease Mother        had bypass in the pass   Heart disease Mother    Anesthesia problems Neg Hx    Hypotension Neg Hx    Malignant hyperthermia Neg Hx    Pseudochol deficiency Neg Hx    Colon cancer Neg Hx    Esophageal cancer Neg Hx    Stomach cancer Neg Hx    Rectal cancer Neg Hx      Social History   Socioeconomic History   Marital status: Married    Spouse name: Not on  file   Number of children: 1   Years of education: Not on file   Highest education level: Not on file  Occupational History   Occupation: LPN    Employer: [employer]  Tobacco Use   Smoking status: Never    Passive exposure: Never   Smokeless tobacco: Never  Vaping Use   Vaping status: Never Used  Substance and Sexual Activity   Alcohol use: No   Drug use: No   Sexual activity: Yes    Birth control/protection: Post-menopausal  Other Topics Concern   Not on file  Social History Narrative   Right handed   Lives with husband one story home   Social Determinants of Health   Financial Resource Strain: Not on file  Food Insecurity: No Food Insecurity (10/10/2022)   Hunger Vital Sign    Worried About Running Out of Food in the Last Year: Never true    Ran Out of Food in the Last Year: Never true  Transportation Needs: No Transportation Needs (10/10/2022)   PRAPARE - Administrator, Civil Service (Medical): No    Lack of Transportation (Non-Medical): No  Physical Activity: Not on file  Stress: Not on file  Social Connections: Unknown (09/25/2021)   [facility]   Social Network    Social Network: Not on file  Intimate Partner Violence: Not At Risk (10/10/2022)   Humiliation, Afraid, Rape, and Kick questionnaire    Fear of Current or Ex-Partner: No    Emotionally Abused: No    Physically Abused: No    Sexually Abused: No     There were no vitals taken for this visit.  Physical Exam:  Well appearing NAD HEENT: Unremarkable Neck:  No  JVD, no thyromegally Lymphatics:  No adenopathy Back:  No CVA tenderness Lungs:  Clear with no wheezes HEART:  Regular rate rhythm, no murmurs, no rubs, no clicks Abd:  soft, positive bowel sounds, no organomegally, no rebound, no guarding Ext:  2 plus pulses, no edema, no cyanosis, no clubbing Skin:  No rashes no nodules Neuro:  CN II through XII intact, motor grossly intact  EKG - sinus brady at 46/min  Assess/Plan: Sinus node dysfunction - I have dicussed the treatment options and the risks/benefits/goals/expectations of PPM insertion were reviewed and she will call us if she wishes to proceed. PAF - she is maintaining NSR. If she goes back out of rhythm, she will need more amiodarone.   Sharlot Gowda Gryphon Vanderveen,MD

## 2023-04-24 NOTE — Telephone Encounter (Signed)
*  STAT* If patient is at the pharmacy, call can be transferred to refill team.   1. Which medications need to be refilled? (please list name of each medication and dose if known) apixaban (ELIQUIS) 5 MG TABS tablet   2. Which pharmacy/location (including street and city if local pharmacy) is medication to be sent to?  [facility] - [place], [place] - [address]      3. Do they need a 30 day or 90 day supply? 90 day

## 2023-04-24 NOTE — Telephone Encounter (Signed)
Pt is requesting Eliquis

## 2023-04-25 ENCOUNTER — Other Ambulatory Visit: Payer: Self-pay

## 2023-04-25 ENCOUNTER — Telehealth: Payer: Self-pay | Admitting: Internal Medicine

## 2023-04-25 DIAGNOSIS — Z01812 Encounter for preprocedural laboratory examination: Secondary | ICD-10-CM

## 2023-04-25 NOTE — Telephone Encounter (Signed)
Pt was given some dates yesterday to have a Pacemaker Procedure. She would like to have it done Jan 7th late morning. Pt would like a call back

## 2023-04-26 NOTE — Telephone Encounter (Signed)
Patient called to follow-up on scheduling her pacemaker procedure.

## 2023-04-27 NOTE — Telephone Encounter (Signed)
Spoke with Pt. Went over instructions. Put copy of letter and scrub at front desk. Pt will call

## 2023-05-01 ENCOUNTER — Telehealth: Payer: Self-pay

## 2023-05-01 NOTE — Telephone Encounter (Signed)
I called pt to move her PPM Implant with Dr. Ladona Ridgel from 1/7 to 1/6 at 9:30 am. I will update her instruction letter and put at the front desk for her to pick up.  She is coming to pick up her scrub when she gets her labs done this week.

## 2023-05-04 ENCOUNTER — Encounter (HOSPITAL_COMMUNITY)
Admission: RE | Admit: 2023-05-04 | Discharge: 2023-05-04 | Disposition: A | Discharge status: Home / Self Care | Payer: PPO | Source: Ambulatory Visit | Attending: Internal Medicine | Admitting: Internal Medicine

## 2023-05-04 DIAGNOSIS — Z01818 Encounter for other preprocedural examination: Secondary | ICD-10-CM

## 2023-05-04 DIAGNOSIS — Z01812 Encounter for preprocedural laboratory examination: Secondary | ICD-10-CM | POA: Insufficient documentation

## 2023-05-04 LAB — CBC
HCT: 43.1 % (ref 36.0–46.0)
Hemoglobin: 13.8 g/dL (ref 12.0–15.0)
MCH: 29.5 pg (ref 26.0–34.0)
MCHC: 32 g/dL (ref 30.0–36.0)
MCV: 92.1 fL (ref 80.0–100.0)
Platelets: 157 10*3/uL (ref 150–400)
RBC: 4.68 MIL/uL (ref 3.87–5.11)
RDW: 13.7 % (ref 11.5–15.5)
WBC: 4.5 10*3/uL (ref 4.0–10.5)
nRBC: 0 % (ref 0.0–0.2)

## 2023-05-04 LAB — BASIC METABOLIC PANEL
Anion gap: 6 (ref 5–15)
BUN: 18 mg/dL (ref 8–23)
CO2: 29 mmol/L (ref 22–32)
Calcium: 9.9 mg/dL (ref 8.9–10.3)
Chloride: 104 mmol/L (ref 98–111)
Creatinine, Ser: 0.84 mg/dL (ref 0.44–1.00)
GFR, Estimated: >60 mL/min (ref >60)
Glucose, Bld: 92 mg/dL (ref 70–99)
Potassium: 4 mmol/L (ref 3.5–5.1)
Sodium: 139 mmol/L (ref 135–145)

## 2023-05-04 NOTE — Progress Notes (Signed)
Pt arrived at [facility] instead of Cardiology office for her instructions, soap and lab work for upcoming surgery January 2025. RN spoke with April Garrison, CMA and okay for pt to receive instructions, soap and lab work here at the hospital. Pt instructed to read over instructions and if she has any questions to call the Cardiology office. Pt understood.

## 2023-05-18 NOTE — Pre-Procedure Instructions (Signed)
 Attempted to patient regarding procedure instructions.  Left voicemail on the following items: Arrival time 0730 Nothing to eat or drink after midnight No meds AM of procedure Responsible person to drive you home and stay with you for 24 hrs Wash with special soap night before and morning of procedure If on anti-coagulant drug instructions Eliquis - last dose 1/3

## 2023-05-21 ENCOUNTER — Ambulatory Visit (HOSPITAL_COMMUNITY)
Admission: RE | Disposition: A | Discharge status: Home / Self Care | Payer: Self-pay | Source: Home / Self Care | Attending: Internal Medicine

## 2023-05-21 ENCOUNTER — Other Ambulatory Visit: Payer: Self-pay

## 2023-05-21 ENCOUNTER — Ambulatory Visit (HOSPITAL_COMMUNITY): Payer: PPO

## 2023-05-21 ENCOUNTER — Ambulatory Visit (HOSPITAL_COMMUNITY)
Admission: RE | Admit: 2023-05-21 | Discharge: 2023-05-21 | Disposition: A | Discharge status: Home / Self Care | Payer: PPO | Attending: Internal Medicine | Admitting: Internal Medicine

## 2023-05-21 DIAGNOSIS — Z79899 Other long term (current) drug therapy: Secondary | ICD-10-CM | POA: Diagnosis not present

## 2023-05-21 DIAGNOSIS — I495 Sick sinus syndrome: Secondary | ICD-10-CM | POA: Diagnosis not present

## 2023-05-21 DIAGNOSIS — I480 Paroxysmal atrial fibrillation: Secondary | ICD-10-CM | POA: Insufficient documentation

## 2023-05-21 HISTORY — PX: PACEMAKER IMPLANT: EP1218

## 2023-05-21 SURGERY — PACEMAKER IMPLANT

## 2023-05-21 MED ORDER — LIDOCAINE HCL (PF) 1 % IJ SOLN
INTRAMUSCULAR | Status: AC
  Filled 2023-05-21: qty 60

## 2023-05-21 MED ORDER — SODIUM CHLORIDE 0.9 % IV SOLN
80.0000 mg | INTRAVENOUS | Status: AC
  Administered 2023-05-21: 80 mg

## 2023-05-21 MED ORDER — MIDAZOLAM HCL 5 MG/5ML IJ SOLN
INTRAMUSCULAR | Status: DC | PRN
  Administered 2023-05-21 (×2): 1 mg via INTRAVENOUS

## 2023-05-21 MED ORDER — IOHEXOL 350 MG/ML SOLN
INTRAVENOUS | Status: DC | PRN
  Administered 2023-05-21: 10 mL

## 2023-05-21 MED ORDER — SODIUM CHLORIDE 0.9 % IV SOLN
INTRAVENOUS | Status: AC
  Filled 2023-05-21: qty 2

## 2023-05-21 MED ORDER — LIDOCAINE HCL (PF) 1 % IJ SOLN
INTRAMUSCULAR | Status: DC | PRN
  Administered 2023-05-21: 60 mL

## 2023-05-21 MED ORDER — CEFAZOLIN SODIUM-DEXTROSE 2-4 GM/100ML-% IV SOLN
INTRAVENOUS | Status: AC
  Filled 2023-05-21: qty 100

## 2023-05-21 MED ORDER — CEFAZOLIN SODIUM-DEXTROSE 2-4 GM/100ML-% IV SOLN
2.0000 g | INTRAVENOUS | Status: AC
  Administered 2023-05-21: 2 g via INTRAVENOUS

## 2023-05-21 MED ORDER — MIDAZOLAM HCL 2 MG/2ML IJ SOLN
INTRAMUSCULAR | Status: AC
  Filled 2023-05-21: qty 2

## 2023-05-21 MED ORDER — FENTANYL CITRATE (PF) 100 MCG/2ML IJ SOLN
INTRAMUSCULAR | Status: AC
  Filled 2023-05-21: qty 2

## 2023-05-21 MED ORDER — ACETAMINOPHEN 325 MG PO TABS: 325.0000 mg | ORAL_TABLET | ORAL | Status: DC | PRN

## 2023-05-21 MED ORDER — CEFAZOLIN SODIUM-DEXTROSE 1-4 GM/50ML-% IV SOLN
1.0000 g | Freq: Once | INTRAVENOUS | Status: AC
  Administered 2023-05-21: 1 g via INTRAVENOUS
  Filled 2023-05-21: qty 50

## 2023-05-21 MED ORDER — ONDANSETRON HCL 4 MG/2ML IJ SOLN: 4.0000 mg | Freq: Four times a day (QID) | INTRAMUSCULAR | Status: DC | PRN

## 2023-05-21 MED ORDER — CHLORHEXIDINE GLUCONATE 4 % EX SOLN
4.0000 | Freq: Once | CUTANEOUS | Status: DC
  Filled 2023-05-21: qty 60

## 2023-05-21 MED ORDER — FENTANYL CITRATE (PF) 100 MCG/2ML IJ SOLN
INTRAMUSCULAR | Status: DC | PRN
  Administered 2023-05-21 (×2): 12.5 ug via INTRAVENOUS

## 2023-05-21 MED ORDER — HEPARIN (PORCINE) IN NACL 1000-0.9 UT/500ML-% IV SOLN
INTRAVENOUS | Status: DC | PRN
  Administered 2023-05-21: 500 mL

## 2023-05-21 MED ORDER — SODIUM CHLORIDE 0.9 % IV SOLN: INTRAVENOUS | Status: DC

## 2023-05-21 SURGICAL SUPPLY — 16 items
CABLE SURGICAL S-101-97-12 (CABLE) ×1 IMPLANT
CATH RIGHTSITE C315HIS02 (CATHETERS) IMPLANT
CATH SELECT PACE 669183 (CATHETERS) IMPLANT
CUTTER LV DELIVERY CATHETER 7 (MISCELLANEOUS) IMPLANT
ELECT DEFIB PAD ADLT CADENCE (PAD) IMPLANT
IPG PACE AZUR XT DR MRI W1DR01 (Pacemaker) IMPLANT
KIT MICROPUNCTURE NIT STIFF (SHEATH) IMPLANT
LEAD CAPSURE NOVUS 5076-52CM (Lead) IMPLANT
LEAD SELECT SECURE 3830 383069 (Lead) IMPLANT
PACE AZURE XT DR MRI W1DR01 (Pacemaker) ×1 IMPLANT
SELECT SECURE 3830 383069 (Lead) ×2 IMPLANT
SHEATH 7FR PRELUDE SNAP 13 (SHEATH) IMPLANT
SHEATH 9FR PRELUDE SNAP 13 (SHEATH) IMPLANT
SLITTER 6232ADJ (MISCELLANEOUS) IMPLANT
TRAY PACEMAKER INSERTION (PACKS) ×1 IMPLANT
WIRE HI TORQ VERSACORE-J 145CM (WIRE) IMPLANT

## 2023-05-21 NOTE — Discharge Instructions (Addendum)
 After Your Pacemaker   You have a [employer] Pacemaker  ACTIVITY Do not lift your arm above shoulder height for 1 week after your procedure. After 7 days, you may progress as below.  You should remove your sling 24 hours after your procedure, unless otherwise instructed by your provider.     Monday May 28, 2023  Tuesday May 29, 2023 Wednesday May 30, 2023 Thursday May 31, 2023   Do not lift, push, pull, or carry anything over 10 pounds with the affected arm until 6 weeks (Monday July 02, 2023 ) after your procedure.   You may drive AFTER your wound check, unless you have been told otherwise by your provider.   Ask your healthcare provider when you can go back to work   INCISION/Dressing If you are on a blood thinner such as Coumadin, Xarelto, Eliquis , Plavix , or Pradaxa please confirm with your provider when this should be resumed.   If large square, outer bandage is left in place, this can be removed after 24 hours from your procedure. Do not remove steri-strips or glue as below.   If a PRESSURE DRESSING (a bulky dressing that usually goes up over your shoulder) was applied or left in place, please follow instructions given by your provider on when to return to have this removed.   Monitor your Pacemaker site for redness, swelling, and drainage. Call the device clinic at [phone] if you experience these symptoms or fever/chills.  If your incision is sealed with Steri-strips or staples, you may shower 7 days after your procedure or when told by your provider. Do not remove the steri-strips or let the shower hit directly on your site. You may wash around your site with soap and water.    If you were discharged in a sling, please do not wear this during the day more than 48 hours after your surgery unless otherwise instructed. This may increase the risk of stiffness and soreness in your shoulder.   Avoid lotions, ointments, or perfumes over your incision until it  is well-healed.  You may use a hot tub or a pool AFTER your wound check appointment if the incision is completely closed.  Pacemaker Alerts:  Some alerts are vibratory and others beep. These are NOT emergencies. Please call our office to let us  know. If this occurs at night or on weekends, it can wait until the next business day. Send a remote transmission.  If your device is capable of reading fluid status (for heart failure), you will be offered monthly monitoring to review this with you.   DEVICE MANAGEMENT Remote monitoring is used to monitor your pacemaker from home. This monitoring is scheduled every 91 days by our office. It allows us  to keep an eye on the functioning of your device to ensure it is working properly. You will routinely see your Electrophysiologist annually (more often if necessary).   You should receive your ID card for your new device in 4-8 weeks. Keep this card with you at all times once received. Consider wearing a medical alert bracelet or necklace.  Your Pacemaker may be MRI compatible. This will be discussed at your next office visit/wound check.  You should avoid contact with strong electric or magnetic fields.   Do not use amateur (ham) radio equipment or electric (arc) welding torches. MP3 player headphones with magnets should not be used. Some devices are safe to use if held at least 12 inches (30 cm) from your Pacemaker. These include power tools, lawn  mowers, and speakers. If you are unsure if something is safe to use, ask your health care provider.  When using your cell phone, hold it to the ear that is on the opposite side from the Pacemaker. Do not leave your cell phone in a pocket over the Pacemaker.  You may safely use electric blankets, heating pads, computers, and microwave ovens.  Call the office right away if: You have chest pain. You feel more short of breath than you have felt before. You feel more light-headed than you have felt before. Your  incision starts to open up.  This information is not intended to replace advice given to you by your health care provider. Make sure you discuss any questions you have with your health care provider.

## 2023-05-21 NOTE — Interval H&P Note (Signed)
 History and Physical Interval Note:  05/21/2023 9:11 AM  [name]  has presented today for surgery, with the diagnosis of bradicardia.  The various methods of treatment have been discussed with the patient and family. After consideration of risks, benefits and other options for treatment, the patient has consented to  Procedure(s): PACEMAKER IMPLANT (N/A) as a surgical intervention.  The patient's history has been reviewed, patient examined, no change in status, stable for surgery.  I have reviewed the patient's chart and labs.  Questions were answered to the patient's satisfaction.     [name]

## 2023-05-22 ENCOUNTER — Encounter (HOSPITAL_COMMUNITY): Payer: Self-pay | Admitting: Internal Medicine

## 2023-05-24 MED FILL — Midazolam HCl Inj 2 MG/2ML (Base Equivalent): INTRAMUSCULAR | Qty: 2 | Status: AC

## 2023-06-05 ENCOUNTER — Telehealth: Payer: Self-pay

## 2023-06-05 NOTE — Telephone Encounter (Signed)
Transmission received 06/05/2023.

## 2023-06-06 ENCOUNTER — Ambulatory Visit: Payer: PPO | Attending: Cardiology

## 2023-06-06 DIAGNOSIS — R001 Bradycardia, unspecified: Secondary | ICD-10-CM | POA: Diagnosis not present

## 2023-06-06 LAB — CUP PACEART INCLINIC DEVICE CHECK
Battery Remaining Longevity: 129 mo
Battery Voltage: 3.21 V
Brady Statistic AP VP Percent: 78.58 %
Brady Statistic AP VS Percent: 0.55 %
Brady Statistic AS VP Percent: 14.71 %
Brady Statistic AS VS Percent: 6.16 %
Brady Statistic RA Percent Paced: 79.14 %
Brady Statistic RV Percent Paced: 93.28 %
Date Time Interrogation Session: 20250122164958
Implantable Lead Connection Status: 753985
Implantable Lead Connection Status: 753985
Implantable Lead Implant Date: 20250106
Implantable Lead Implant Date: 20250106
Implantable Lead Location: 753859
Implantable Lead Location: 753860
Implantable Lead Model: 3830
Implantable Lead Model: 5076
Implantable Pulse Generator Implant Date: 20250106
Lead Channel Impedance Value: 361 Ohm
Lead Channel Impedance Value: 475 Ohm
Lead Channel Impedance Value: 513 Ohm
Lead Channel Impedance Value: 646 Ohm
Lead Channel Pacing Threshold Amplitude: 0.5 V
Lead Channel Pacing Threshold Amplitude: 0.625 V
Lead Channel Pacing Threshold Pulse Width: 0.4 ms
Lead Channel Pacing Threshold Pulse Width: 0.4 ms
Lead Channel Sensing Intrinsic Amplitude: 3.25 mV
Lead Channel Sensing Intrinsic Amplitude: 3.25 mV
Lead Channel Sensing Intrinsic Amplitude: 6.125 mV
Lead Channel Sensing Intrinsic Amplitude: 6.5 mV
Lead Channel Setting Pacing Amplitude: 3.5 V
Lead Channel Setting Pacing Amplitude: 3.5 V
Lead Channel Setting Pacing Pulse Width: 0.4 ms
Lead Channel Setting Sensing Sensitivity: 1.2 mV
Zone Setting Status: 755011

## 2023-06-06 NOTE — Progress Notes (Signed)
Normal Pacemaker wound check. Wound well healed. Thresholds, sensing, and impedances consistent with implant measurements and at 3.5V safety margin/auto capture until 3 month visit. No episodes. Reviewed arm restrictions to continue for 6 weeks total post op.  Pt enrolled in remote follow-up.

## 2023-06-06 NOTE — Patient Instructions (Signed)

## 2023-08-19 NOTE — Progress Notes (Unsigned)
 Date:  08/19/2023   ID:  [name], [name] [birth year], MRN [id number]   PCP:  [facility]  Cardiologist:  Tocarra Gassen [place], MD  Electrophysiologist:  None   Evaluation Performed:  Follow-Up Visit  Chief Complaint:  CAD  History of Present Illness:    [name] is a 83 y.o. 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 Cornelius Moras (LIMA-LAD, SVG-OM, SVG-PDA), postop atrial fibrillation, spastic dysphonia, carotid artery disease, hypertension and hyperlipidemia.  Cardiopulmonary stress test in 2017 showed no cardiac or ventilatory limitation.  ETT March 2018 showed hypertensive response.  Previous Myoview in April 2018 was low risk but no ischemia or scar.  She is intolerant of Crestor.  Patient was seen on 07/08/2018 at which time she complained of increasing dyspnea on exertion going up hills.  This is similar to her previous symptoms prior to CABG.  Echocardiogram obtained on 07/15/2018 revealed EF 60 to 65%, mild AI, moderately elevated RV pressure. Repeat Myoview was performed on 07/19/2018 which showed EF 67%, normal perfusion.  Her dyspnea on exertion was felt to be related to diastolic heart failure and she was placed on low-dose Lasix and potassium supplement.  Losartan was increased to 100 mg daily.  She was seen in our lipid clinic in June. Options for further lipid management were discussed. She was started on Repatha. She has been compliant with this.   She presented to [facility] ED on December 24, 2019 with unstable angina.  Subsequent troponins were normal. Transferred to [facility] in [place]. Underwent cardiac cath showing patent bypass grafts. This included a LIMA to the LAD, SVG to PDA and apparently SVG to diagonal (instead of OM as noted in op report).  She underwent PCI of the proximal LCx with a 2.75 x 12 mm Synergy stent. Echo was unremarkable except for moderate TR. She was started on Plavix.  I reviewed films from her cardiac cath in  [facility]. She has patent LIMA to the LAD, SVG to OM1, and SVG to PDA. There was a severe stenosis in the proximal LCx that supplies the second and third OMs distally. This stenosis was successfully stented. The mid to distal LCx has moderate diffuse disease.   She was seen 01/05/21 with intermittent palpitations.  Event monitor showed some NSVT, nonsustained SVT, atrial fibrillation- low burden. Placed on Eliquis.   Hospitalized 01/2021 with atrial flutter with RVR at [facility].  She was started on amiodarone drip and converted to NSR.  She was then transitioned to p.o. amiodarone. CT chest angiography 01/31/21 normal heart size, evidence of CABG, no evidence of aortic dissection, few tree-in-bud opacities and centrilobular nodules in right upper lobe suggesting infectious/inflammatory bronchioloitis, marked celiac artery origin narrowing, ventral abdominal wall laxity with superimposed hernia containing nonobstructed small bowel loops. Her chest discomfort was though to be due to arrhythmia as troponin x3 unremarkable.   She was evaluated by Dr Ladona Ridgel in February for bradycardia. Has some Sinus node dysfunction. Bradycardia improved on lower dose of amiodarone. For now watching closely. If increased arrhythmia may need higher amiodarone dose in which case she may need a pacemaker.   She was admitted 10/09/22 for rapid atrial flutter associated with chest discomfort, noted compliance with Amiodarone 50mg  daily. She had been maintained with a lower dose for history of bradycardia. In the ED her labs were unremarkable, though BNP was elevated at 288.2. Initially she was reloaded with IV amiodarone. She then underwent a  successful DCCV with return to sinus rhythm. Echo showed EF 55 to 60%, LV had normal function. Discharged on amiodarone 100mg  daily and continued Eliquis. She was seen by Dr Lalla Brothers in June. Noted some HR in 40s. No indication for pacemaker at that time and continued monitoring. Was seen in  the office in August and doing well at that time.   Was seen by Dr Ladona Ridgel in Dec. Was having symptomatic bradycardia. Underwent dual chamber pacemaker placement in January.    Past Medical History:  Diagnosis Date   Anemia    "in my early teens"   Arthritis    neck and back   Atrial fibrillation (HCC)    post-op after CABG;  treated with Amiodarone and coumadin until readmxn with CDiff colitis, perf colon and elevated LFTs (amio and coumadin stopped)   Atrial flutter with rapid ventricular response (HCC) 10/09/2022   C. difficile colitis 12/2010   complicated post CABG course with prolonged admission, perforated sigmoid colon;  s/p sigmoid colectomy with end colostomy [name] procedure);  laparotomy wound infection with Pseudomonas treated with antibxs and secondary intention   Chronic kidney disease    hx kidney stones   Coronary artery disease    a. NSTEMI 7/12, cath: pLAD 30%, mLAD 60-70%, dLAD 80%, oOM2 50%, mOM2 40%, trifurcation 80%, pRCA 25%, then occluded, L-R collats, EF 55%;   b. 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 Cornelius Moras) // c. ETT 3/18: inf-lat ST depression of 1 mm at 4 min of exercise, frequent ectopy and hypertensive BP response // d. 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   GERD (gastroesophageal reflux disease)    H/O hiatal hernia    "slight; not having problems with it"   Hair loss    after heart surgery   Hemorrhoids    "none since hemorrhoid OR"   History of bronchitis    late 1980's   Hyperlipidemia    takes Pravastatin daily   Hypertension    takes Metoprolol daily   Mitral valve prolapse    was told this in the late 1990's; ;then with Dr.Mclean he states that she doesn't   Myocardial infarction (HCC) 11/2010   Palpitations 2003   hx of; Holter in 2003 with PACs and PVCs   Pericarditis 1980   hx of   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   DR.OWEN   S/P colostomy (HCC)    due to perf  sigmoid colon in setting of CDiff colitis   Seizures (HCC)    "w/verapimil"   Surgical wound infection    Pseudomonas; wound VAC; secondary intention   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   UC (ulcerative colitis) [facility])    Past Surgical History:  Procedure Laterality Date   ABDOMINAL HYSTERECTOMY     partial hysterectomy   APPENDECTOMY     as a child   BOTOX INJECTION  12/03/2015   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   CARDIAC CATHETERIZATION  11/21/10   CARDIOVERSION N/A 10/10/2022   Procedure: CARDIOVERSION;  Surgeon: Parke Poisson, MD;  Location: [facility] INVASIVE CV LAB;  Service: Cardiovascular;  Laterality: N/A;   CATARACT EXTRACTION, BILATERAL     CHOLECYSTECTOMY N/A 01/25/2016   Procedure: LAPAROSCOPIC CHOLECYSTECTOMY WITH INTRAOPERATIVE CHOLANGIOGRAM;  Surgeon: Gaynelle Adu, MD;  Location: [facility] ORS;  Service: General;  Laterality: N/A;   COLONOSCOPY     COLOSTOMY RECONNECT     colostomy  reversal  06/27/11   CORONARY ANGIOPLASTY WITH STENT PLACEMENT     CORONARY ARTERY BYPASS GRAFT  11-25-2010   CABG X3; LIMA to LAD, SVG to PDA, SVG to OM, EVH via right thigh   ESOPHAGOGASTRODUODENOSCOPY     FLEXIBLE SIGMOIDOSCOPY  11/09/2011   Procedure: FLEXIBLE SIGMOIDOSCOPY;  Surgeon: Rachael Fee, MD;  Location: [facility] ENDOSCOPY;  Service: Endoscopy;  Laterality: N/A;   HEMORRHOID SURGERY  ~ 2005   x 2   OOPHORECTOMY  2005   left; w/"mass removal"   PACEMAKER IMPLANT N/A 05/21/2023   Procedure: PACEMAKER IMPLANT;  Surgeon: Marinus Maw, MD;  Location: [facility] INVASIVE CV LAB;  Service: Cardiovascular;  Laterality: N/A;   PROCTOSCOPY  06/27/2011   Procedure: PROCTOSCOPY;  Surgeon: Atilano Ina, MD;  Location: [facility] OR;  Service: General;  Laterality: N/A;   SIGMOIDECTOMY  12/18/2010   with end colostomy   TUBAL LIGATION       No outpatient medications have been marked as taking for the 08/23/23 encounter (Appointment) with [place], Xiomar Crompton M, MD.     Allergies:   Apple, Nadolol, Sulfa  antibiotics, Sulfamethoxazole, Sulfonamide derivatives, Verapamil, Crestor [rosuvastatin], and Flagyl [metronidazole]   Social History   Tobacco Use   Smoking status: Never    Passive exposure: Never   Smokeless tobacco: Never  Vaping Use   Vaping status: Never Used  Substance Use Topics   Alcohol use: No   Drug use: No     Family Hx: The patient's family history includes Coronary artery disease in her mother; Heart attack (age of onset: 33) in her father; Heart disease in her father and mother. There is no history of Anesthesia problems, Hypotension, Malignant hyperthermia, Pseudochol deficiency, Colon cancer, Esophageal cancer, Stomach cancer, or Rectal cancer.  ROS:   Please see the history of present illness.    All other systems reviewed and are negative.   Prior CV studies:   The following studies were reviewed today:  Echo 07/17/18: IMPRESSIONS      1. The left ventricle has normal systolic function with an ejection fraction of 60-65%. The cavity size was normal. Left ventricular diastolic Doppler parameters are consistent with pseudonormalization Elevated left atrial and left ventricular  end-diastolic pressures.  2. The right ventricle has normal systolic function. The cavity was normal. There is no increase in right ventricular wall thickness. Right ventricular systolic pressure is moderately elevated with an estimated pressure of 43.7 mmHg.  3. The mitral valve is degenerative. Moderate thickening of the mitral valve leaflets with moderate involvement of the chordae tendinae. Mild calcification of the mitral valve leaflet.  4. The tricuspid valve is normal in structure.  5. The aortic valve is tricuspid Aortic valve regurgitation is mild by color flow Doppler.  6. The pulmonic valve was normal in structure.  7. The inferior vena cava was dilated in size with <50% respiratory variability.  8. The interatrial septum appears to be lipomatous.   Myoview 07/19/2018 Study  Highlights      The left ventricular ejection fraction is hyperdynamic (>65%). Nuclear stress EF: 67%. No T wave inversion was noted during stress. There was no ST segment deviation noted during stress. The study is normal. This is a low risk study.   Normal perfusion. LVEF 67% with normal wall motion. This is a low risk study. No change compared to prior study in 2018.   Myoview 10/07/20: Study Highlights    The left ventricular ejection fraction is normal (55-65%). Nuclear stress EF:  62%. Blood pressure demonstrated a normal response to exercise. ST segment depression was noted during stress in the II, III, aVF, V5 and V6 leads. The study is normal. This is a low risk study.   Normal stress nuclear study with no ischemia or infarction.  Gated ejection fraction 62% with normal wall motion.  Patient did complain of atypical chest pain during the study and there was note of ST depression in the inferolateral leads.   Event monitor 10/28/20: Study Highlights    Normal sinus rhythm Rare PACs with runs of SVT/PAT. longest lasting 19 seconds Rare PVCs with infrequent NSVT 4-6 beats versus aberrancy Paroxysmal Afib with rate 73-164. mean 124 bpm. burden is low at < 1%. Minimal HR 31 at 7:29 am. Unclear if this is post termination.  Echo 10/10/22: IMPRESSIONS     1. Left ventricular ejection fraction, by estimation, is 55 to 60%. The  left ventricle has normal function. The left ventricle has no regional  wall motion abnormalities. Left ventricular diastolic parameters are  indeterminate.   2. Right ventricular systolic function is mildly reduced. The right  ventricular size is normal. There is normal pulmonary artery systolic  pressure. The estimated right ventricular systolic pressure is 34.4 mmHg.   3. Right atrial size was mildly dilated.   4. The mitral valve is grossly normal. Mild mitral valve regurgitation.  No evidence of mitral stenosis.   5. The aortic valve is  tricuspid. Aortic valve regurgitation is trivial.  Aortic valve sclerosis is present, with no evidence of aortic valve  stenosis.   6. The inferior vena cava is normal in size with greater than 50%  respiratory variability, suggesting right atrial pressure of 3 mmHg.    Labs/Other Tests and Data Reviewed:    EKG:  none today    Recent Labs: 10/09/2022: B Natriuretic Peptide 288.2; TSH 3.623 02/19/2023: ALT 11 05/04/2023: BUN 18; Creatinine, Ser 0.84; Hemoglobin 13.8; Platelets 157; Potassium 4.0; Sodium 139   Recent Lipid Panel Lab Results  Component Value Date/Time   CHOL 164 02/19/2023 08:56 AM   TRIG 74 02/19/2023 08:56 AM   HDL 85 02/19/2023 08:56 AM   CHOLHDL 1.9 02/19/2023 08:56 AM   CHOLHDL 3 04/13/2014 09:34 AM   LDLCALC 65 02/19/2023 08:56 AM   LDLDIRECT 81.8 07/20/2011 03:04 PM   Dated 07/02/18: cholesterol 161, triglycerides 81, HDL 52, LDL 93. CMET and TSH normal  Dated 12/24/19: BNP 300. Glucose 144, BUN 22, otherwise CMET normal. plts 132K. WBC 3.2K.  Dated 07/18/22: cholesterol 151, triglycerides 141, HDL 74, LDL 49.   Wt Readings from Last 3 Encounters:  05/21/23 133 lb (60.3 kg)  02/19/23 132 lb 9.6 oz (60.1 kg)  12/21/22 131 lb (59.4 kg)     Objective:    Vital Signs:  There were no vitals taken for this visit.   GENERAL:  Well appearing WF in NAD HEENT:  PERRL, EOMI, sclera are clear. Oropharynx is clear. NECK:  No jugular venous distention, carotid upstroke brisk and symmetric, no bruits, no thyromegaly or adenopathy LUNGS:  Clear to auscultation bilaterally CHEST:  Unremarkable HEART:  RRR,  PMI not displaced or sustained,S1 and S2 within normal limits, no S3, no S4: no clicks, no rubs, no murmurs ABD:  Soft, nontender. BS +, no masses or bruits. No hepatomegaly, no splenomegaly EXT:  2 + pulses throughout, no edema, no cyanosis no clubbing. She has some bruising at her right groin site but no hematoma. SKIN:  Warm and dry.  No rashes NEURO:  Alert  and oriented x 3. Cranial nerves II through XII intact. PSYCH:  Cognitively intact    ASSESSMENT & PLAN:    1.  CAD s/p CABG x 3 in 2012 for multivessel CAD. Normal Myoview in May 2020. Had  unstable angina in August 2021. S/p DES of the proximal LCx. Patent grafts.  No longer on antiplatelet therapy since on Eliquis.  Repeat Myoview in May 2022 was normal.    2. Hypercholesterolemia. intolerant of statins. Now on Repatha with excellent response. Will update labs today   3. Carotid arterial disease. 60-79% RICA stenosis.  Last check in March. Repeat yearly   4. HTN controlled.   5.  Paroxysmal afib/SVT/ sinus node dysfunction.   Now anticoagulated. Last episode of arrhythmia in May with DCCV. Not a candidate for beta blocker. Continue low dose amiodarone. Followed by EP  6. Chronic diastolic dysfunction. Off diuretics now and euvolemic      Medication Adjustments/Labs and Tests Ordered: Current medicines are reviewed at length with the patient today.  Concerns regarding medicines are outlined above.   Tests Ordered: No orders of the defined types were placed in this encounter.    Medication Changes: No orders of the defined types were placed in this encounter.    Follow Up:  6 months   Signed, Ronell Duffus [place], MD  08/19/2023 9:42 AM    [facility]  Addendum    Roderick Sweezy [place] MD, [facility]

## 2023-08-20 ENCOUNTER — Ambulatory Visit (INDEPENDENT_AMBULATORY_CARE_PROVIDER_SITE_OTHER): Payer: PPO

## 2023-08-20 DIAGNOSIS — I480 Paroxysmal atrial fibrillation: Secondary | ICD-10-CM

## 2023-08-20 DIAGNOSIS — R001 Bradycardia, unspecified: Secondary | ICD-10-CM | POA: Diagnosis not present

## 2023-08-21 LAB — CUP PACEART REMOTE DEVICE CHECK
Battery Remaining Longevity: 144 mo
Battery Voltage: 3.2 V
Brady Statistic AP VP Percent: 87.15 %
Brady Statistic AP VS Percent: 0.92 %
Brady Statistic AS VP Percent: 7.68 %
Brady Statistic AS VS Percent: 4.26 %
Brady Statistic RA Percent Paced: 88.05 %
Brady Statistic RV Percent Paced: 94.83 %
Date Time Interrogation Session: 20250407040529
Implantable Lead Connection Status: 753985
Implantable Lead Connection Status: 753985
Implantable Lead Implant Date: 20250106
Implantable Lead Implant Date: 20250106
Implantable Lead Location: 753859
Implantable Lead Location: 753860
Implantable Lead Model: 3830
Implantable Lead Model: 5076
Implantable Pulse Generator Implant Date: 20250106
Lead Channel Impedance Value: 323 Ohm
Lead Channel Impedance Value: 437 Ohm
Lead Channel Impedance Value: 456 Ohm
Lead Channel Impedance Value: 608 Ohm
Lead Channel Pacing Threshold Amplitude: 0.625 V
Lead Channel Pacing Threshold Amplitude: 0.75 V
Lead Channel Pacing Threshold Pulse Width: 0.4 ms
Lead Channel Pacing Threshold Pulse Width: 0.4 ms
Lead Channel Sensing Intrinsic Amplitude: 2.125 mV
Lead Channel Sensing Intrinsic Amplitude: 2.125 mV
Lead Channel Sensing Intrinsic Amplitude: 6.25 mV
Lead Channel Sensing Intrinsic Amplitude: 6.25 mV
Lead Channel Setting Pacing Amplitude: 1.5 V
Lead Channel Setting Pacing Amplitude: 2 V
Lead Channel Setting Pacing Pulse Width: 0.4 ms
Lead Channel Setting Sensing Sensitivity: 1.2 mV
Zone Setting Status: 755011

## 2023-08-23 ENCOUNTER — Encounter: Payer: Self-pay | Admitting: Cardiology

## 2023-08-23 ENCOUNTER — Ambulatory Visit: Payer: PPO | Attending: Cardiology | Admitting: Cardiology

## 2023-08-23 VITALS — BP 137/78 | HR 78 | Ht 66.0 in | Wt 134.4 lb

## 2023-08-23 DIAGNOSIS — I10 Essential (primary) hypertension: Secondary | ICD-10-CM

## 2023-08-23 DIAGNOSIS — I6523 Occlusion and stenosis of bilateral carotid arteries: Secondary | ICD-10-CM | POA: Diagnosis not present

## 2023-08-23 DIAGNOSIS — I779 Disorder of arteries and arterioles, unspecified: Secondary | ICD-10-CM

## 2023-08-23 DIAGNOSIS — I480 Paroxysmal atrial fibrillation: Secondary | ICD-10-CM | POA: Diagnosis not present

## 2023-08-23 DIAGNOSIS — R001 Bradycardia, unspecified: Secondary | ICD-10-CM

## 2023-08-23 DIAGNOSIS — I2510 Atherosclerotic heart disease of native coronary artery without angina pectoris: Secondary | ICD-10-CM

## 2023-08-23 DIAGNOSIS — E785 Hyperlipidemia, unspecified: Secondary | ICD-10-CM

## 2023-08-23 NOTE — Patient Instructions (Signed)
 Medication Instructions:  Continue same medications *If you need a refill on your cardiac medications before your next appointment, please call your pharmacy*  Lab Work: None ordered  Testing/Procedures: Carotid dopplers   first available  Follow-Up: At [facility], you and your health needs are our priority.  As part of our continuing mission to provide you with exceptional heart care, our providers are all part of one team.  This team includes your primary Cardiologist (physician) and Advanced Practice Providers or APPs (Physician Assistants and Nurse Practitioners) who all work together to provide you with the care you need, when you need it.  Your next appointment:  6 months     Call in July to schedule Oct appointment     Provider:  Dr.Jordan   We recommend signing up for the patient portal called "MyChart".  Sign up information is provided on this After Visit Summary.  MyChart is used to connect with patients for Virtual Visits (Telemedicine).  Patients are able to view lab/test results, encounter notes, upcoming appointments, etc.  Non-urgent messages can be sent to your provider as well.   To learn more about what you can do with MyChart, go to [email or web address].         1st Floor: - Lobby - Registration  - Pharmacy  - Lab - Cafe  2nd Floor: - PV Lab - Diagnostic Testing (echo, CT, nuclear med)  3rd Floor: - Vacant  4th Floor: - TCTS (cardiothoracic surgery) - AFib Clinic - Structural Heart Clinic - Vascular Surgery  - Vascular Ultrasound  5th Floor: - HeartCare Cardiology (general and EP) - Clinical Pharmacy for coumadin, hypertension, lipid, weight-loss medications, and med management appointments    Valet parking services will be available as well.

## 2023-08-28 ENCOUNTER — Encounter: Payer: PPO | Admitting: Student

## 2023-08-28 NOTE — Progress Notes (Unsigned)
  Electrophysiology Office Note:   ID:  Aaniyah, Strohm 07-Apr-1941, MRN [id number]  Primary Cardiologist: Peter [place], MD Electrophysiologist: Manya Sells, MD -> Dr. Marven Slimmer     History of Present Illness:   [name] is a 83 y.o. female with h/o symptomatic bradycardia/SND s/p PPM and paroxysmal AF/AFL seen today for routine electrophysiology followup.   Since last being seen in our clinic the patient reports doing very well. Overall, she denies chest pain, palpitations, PND, orthopnea, nausea, vomiting, dizziness, syncope, edema, weight gain, or early satiety.  She does have mild dyspnea on inclines, but doesn't consider it abnormal.   Review of systems complete and found to be negative unless listed in HPI.   EP Information / Studies Reviewed:    EKG is ordered today. Personal review as below.  EKG Interpretation Date/Time:  Wednesday August 29 2023 10:21:21 EDT Ventricular Rate:  66 PR Interval:  178 QRS Duration:  106 QT Interval:  420 QTC Calculation: 440 R Axis:   93  Text Interpretation: AV dual-paced rhythm When compared with ECG of 21-May-2023 14:51, Premature ventricular complexes are no longer Present Vent. rate has increased BY   4 BPM Confirmed by Pilar Bridge [phone]) on 08/29/2023 10:28:52 AM    PPM Interrogation-  reviewed in detail today,  See PACEART report.  Arrhythmia/Device History MDT Dual Chamber PPM 05/2023 for symptomatic brady/CND RV in LBA   Physical Exam:   VS:  BP 122/68   Pulse 66   Ht 5\' 6"  (1.676 m)   Wt 137 lb (62.1 kg)   SpO2 99%   BMI 22.11 kg/m    Wt Readings from Last 3 Encounters:  08/29/23 137 lb (62.1 kg)  08/23/23 134 lb 6.4 oz (61 kg)  05/21/23 133 lb (60.3 kg)     GEN: No acute distress  NECK: No JVD; No carotid bruits CARDIAC: Regular rate and rhythm, no murmurs, rubs, gallops RESPIRATORY:  Clear to auscultation without rales, wheezing or rhonchi  ABDOMEN: Soft, non-tender, non-distended EXTREMITIES:  No  edema; No deformity   ASSESSMENT AND PLAN:    Symptomatic bradycardia s/p Medtronic PPM  Normal PPM function See Pace Art report No changes today  Atrial fib Atrial flutter <1% burden Continue amiodarone 100 mg daily Continue eliquis 5 mg BID Surveillance labs today.   HTN Stable on current regimen    Disposition:   Follow up with EP APP in 6 months. She has seen Dr. Marven Slimmer previously and would like him to assume her care on GTs retirement.   Signed, Tylene Galla, PA-C

## 2023-08-29 ENCOUNTER — Encounter: Payer: Self-pay | Admitting: Student

## 2023-08-29 ENCOUNTER — Ambulatory Visit: Attending: Student | Admitting: Student

## 2023-08-29 VITALS — BP 122/68 | HR 66 | Ht 66.0 in | Wt 137.0 lb

## 2023-08-29 DIAGNOSIS — I10 Essential (primary) hypertension: Secondary | ICD-10-CM | POA: Diagnosis not present

## 2023-08-29 DIAGNOSIS — R001 Bradycardia, unspecified: Secondary | ICD-10-CM

## 2023-08-29 DIAGNOSIS — Z9189 Other specified personal risk factors, not elsewhere classified: Secondary | ICD-10-CM

## 2023-08-29 DIAGNOSIS — Z79899 Other long term (current) drug therapy: Secondary | ICD-10-CM

## 2023-08-29 DIAGNOSIS — I480 Paroxysmal atrial fibrillation: Secondary | ICD-10-CM

## 2023-08-29 LAB — CUP PACEART INCLINIC DEVICE CHECK
Battery Remaining Longevity: 146 mo
Battery Voltage: 3.2 V
Brady Statistic AP VP Percent: 87.4 %
Brady Statistic AP VS Percent: 0.88 %
Brady Statistic AS VP Percent: 7.81 %
Brady Statistic AS VS Percent: 3.92 %
Brady Statistic RA Percent Paced: 88.26 %
Brady Statistic RV Percent Paced: 95.2 %
Date Time Interrogation Session: 20250416105037
Implantable Lead Connection Status: 753985
Implantable Lead Connection Status: 753985
Implantable Lead Implant Date: 20250106
Implantable Lead Implant Date: 20250106
Implantable Lead Location: 753859
Implantable Lead Location: 753860
Implantable Lead Model: 3830
Implantable Lead Model: 5076
Implantable Pulse Generator Implant Date: 20250106
Lead Channel Impedance Value: 361 Ohm
Lead Channel Impedance Value: 475 Ohm
Lead Channel Impedance Value: 513 Ohm
Lead Channel Impedance Value: 646 Ohm
Lead Channel Pacing Threshold Amplitude: 0.625 V
Lead Channel Pacing Threshold Amplitude: 0.625 V
Lead Channel Pacing Threshold Pulse Width: 0.4 ms
Lead Channel Pacing Threshold Pulse Width: 0.4 ms
Lead Channel Sensing Intrinsic Amplitude: 2.75 mV
Lead Channel Sensing Intrinsic Amplitude: 3 mV
Lead Channel Sensing Intrinsic Amplitude: 8.375 mV
Lead Channel Sensing Intrinsic Amplitude: 8.875 mV
Lead Channel Setting Pacing Amplitude: 1.5 V
Lead Channel Setting Pacing Amplitude: 2 V
Lead Channel Setting Pacing Pulse Width: 0.4 ms
Lead Channel Setting Sensing Sensitivity: 1.2 mV
Zone Setting Status: 755011

## 2023-08-29 NOTE — Patient Instructions (Signed)
 Medication Instructions:  Your physician recommends that you continue on your current medications as directed. Please refer to the Current Medication list given to you today.  *If you need a refill on your cardiac medications before your next appointment, please call your pharmacy*  Lab Work: CMET, TSH, FreeT4-TODAY If you have labs (blood work) drawn today and your tests are completely normal, you will receive your results only by: MyChart Message (if you have MyChart) OR A paper copy in the mail If you have any lab test that is abnormal or we need to change your treatment, we will call you to review the results.  Follow-Up: At [facility], you and your health needs are our priority.  As part of our continuing mission to provide you with exceptional heart care, our providers are all part of one team.  This team includes your primary Cardiologist (physician) and Advanced Practice Providers or APPs (Physician Assistants and Nurse Practitioners) who all work together to provide you with the care you need, when you need it.  Your next appointment:   6 month(s)  Provider:   You will see one of the following Advanced Practice Providers on your designated Care Team:   Francis Dowse, [place] Casimiro Needle "Mardelle Matte" Tillery, PA-C Canary Brim, NP    1st Floor: - Lobby - Registration  - Pharmacy  - Lab - Cafe  2nd Floor: - PV Lab - Diagnostic Testing (echo, CT, nuclear med)  3rd Floor: - Vacant  4th Floor: - TCTS (cardiothoracic surgery) - AFib Clinic - Structural Heart Clinic - Vascular Surgery  - Vascular Ultrasound  5th Floor: - HeartCare Cardiology (general and EP) - Clinical Pharmacy for coumadin, hypertension, lipid, weight-loss medications, and med management appointments    Valet parking services will be available as well.

## 2023-08-30 LAB — COMPREHENSIVE METABOLIC PANEL WITH GFR
ALT: 14 IU/L (ref 0–32)
AST: 22 IU/L (ref 0–40)
Albumin: 4.4 g/dL (ref 3.7–4.7)
Alkaline Phosphatase: 73 IU/L (ref 44–121)
BUN/Creatinine Ratio: 20 (ref 12–28)
BUN: 20 mg/dL (ref 8–27)
Bilirubin Total: 0.4 mg/dL (ref 0.0–1.2)
CO2: 26 mmol/L (ref 20–29)
Calcium: 9.5 mg/dL (ref 8.7–10.3)
Chloride: 103 mmol/L (ref 96–106)
Creatinine, Ser: 0.98 mg/dL (ref 0.57–1.00)
Globulin, Total: 2.3 g/dL (ref 1.5–4.5)
Glucose: 89 mg/dL (ref 70–99)
Potassium: 4.4 mmol/L (ref 3.5–5.2)
Sodium: 141 mmol/L (ref 134–144)
Total Protein: 6.7 g/dL (ref 6.0–8.5)
eGFR: 58 mL/min/{1.73_m2} — ABNORMAL LOW (ref >59)

## 2023-08-30 LAB — T4, FREE: Free T4: 1.23 ng/dL (ref 0.82–1.77)

## 2023-08-30 LAB — TSH: TSH: 2.49 u[IU]/mL (ref 0.450–4.500)

## 2023-09-07 NOTE — Telephone Encounter (Signed)
 Instruction letter has been discarded since patient hasn't picked it from our office.

## 2023-09-27 ENCOUNTER — Encounter (HOSPITAL_COMMUNITY)

## 2023-09-28 ENCOUNTER — Ambulatory Visit (HOSPITAL_COMMUNITY)
Admission: RE | Admit: 2023-09-28 | Discharge: 2023-09-28 | Disposition: A | Discharge status: Home / Self Care | Source: Ambulatory Visit | Attending: Cardiology | Admitting: Cardiology

## 2023-09-28 DIAGNOSIS — I6523 Occlusion and stenosis of bilateral carotid arteries: Secondary | ICD-10-CM | POA: Diagnosis present

## 2023-10-01 ENCOUNTER — Other Ambulatory Visit: Payer: Self-pay

## 2023-10-01 ENCOUNTER — Ambulatory Visit: Payer: Self-pay | Admitting: Cardiology

## 2023-10-01 DIAGNOSIS — I779 Disorder of arteries and arterioles, unspecified: Secondary | ICD-10-CM

## 2023-10-03 NOTE — Addendum Note (Signed)
 Addended by: Edra Govern D on: 10/03/2023 05:12 PM   Modules accepted: Orders

## 2023-10-03 NOTE — Progress Notes (Signed)
 Remote pacemaker transmission.

## 2023-10-11 ENCOUNTER — Telehealth: Payer: Self-pay | Admitting: Cardiology

## 2023-10-11 NOTE — Telephone Encounter (Signed)
 Caller Bynum Cassis) wants a copy of patient's Carotid test results sent to them at fax# [phone].

## 2023-10-30 ENCOUNTER — Other Ambulatory Visit: Payer: Self-pay | Admitting: Cardiology

## 2023-10-30 DIAGNOSIS — I480 Paroxysmal atrial fibrillation: Secondary | ICD-10-CM

## 2023-11-19 ENCOUNTER — Ambulatory Visit (INDEPENDENT_AMBULATORY_CARE_PROVIDER_SITE_OTHER)

## 2023-11-19 DIAGNOSIS — I480 Paroxysmal atrial fibrillation: Secondary | ICD-10-CM

## 2023-11-20 LAB — CUP PACEART REMOTE DEVICE CHECK
Battery Remaining Longevity: 139 mo
Battery Voltage: 3.16 V
Brady Statistic AP VP Percent: 89.28 %
Brady Statistic AP VS Percent: 0.43 %
Brady Statistic AS VP Percent: 8.92 %
Brady Statistic AS VS Percent: 1.37 %
Brady Statistic RA Percent Paced: 89.69 %
Brady Statistic RV Percent Paced: 98.19 %
Date Time Interrogation Session: 20250707025018
Implantable Lead Connection Status: 753985
Implantable Lead Connection Status: 753985
Implantable Lead Implant Date: 20250106
Implantable Lead Implant Date: 20250106
Implantable Lead Location: 753859
Implantable Lead Location: 753860
Implantable Lead Model: 3830
Implantable Lead Model: 5076
Implantable Pulse Generator Implant Date: 20250106
Lead Channel Impedance Value: 342 Ohm
Lead Channel Impedance Value: 437 Ohm
Lead Channel Impedance Value: 437 Ohm
Lead Channel Impedance Value: 570 Ohm
Lead Channel Pacing Threshold Amplitude: 0.625 V
Lead Channel Pacing Threshold Amplitude: 0.75 V
Lead Channel Pacing Threshold Pulse Width: 0.4 ms
Lead Channel Pacing Threshold Pulse Width: 0.4 ms
Lead Channel Sensing Intrinsic Amplitude: 3.375 mV
Lead Channel Sensing Intrinsic Amplitude: 3.375 mV
Lead Channel Sensing Intrinsic Amplitude: 8.875 mV
Lead Channel Sensing Intrinsic Amplitude: 8.875 mV
Lead Channel Setting Pacing Amplitude: 1.5 V
Lead Channel Setting Pacing Amplitude: 2 V
Lead Channel Setting Pacing Pulse Width: 0.4 ms
Lead Channel Setting Sensing Sensitivity: 1.2 mV
Zone Setting Status: 755011

## 2023-11-24 ENCOUNTER — Ambulatory Visit: Payer: Self-pay | Admitting: Cardiology

## 2023-12-10 ENCOUNTER — Other Ambulatory Visit: Payer: Self-pay | Admitting: Cardiology

## 2023-12-20 ENCOUNTER — Other Ambulatory Visit: Payer: Self-pay | Admitting: Internal Medicine

## 2024-01-03 ENCOUNTER — Other Ambulatory Visit (HOSPITAL_BASED_OUTPATIENT_CLINIC_OR_DEPARTMENT_OTHER): Payer: Self-pay | Admitting: Cardiology

## 2024-02-10 NOTE — Progress Notes (Signed)
 Date:  02/20/2024   ID:  Dosha, Broshears July 22, 1940, MRN [id number]   PCP:  Trinidad Hun, MD  Cardiologist:  Mohid Furuya [place], MD  Electrophysiologist:  OLE ONEIDA HOLTS, MD   Evaluation Performed:  Follow-Up Visit  Chief Complaint:  CAD  History of Present Illness:    [name] is a 83 y.o. 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 Dusty (LIMA-LAD, SVG-OM, SVG-PDA), postop atrial fibrillation, spastic dysphonia, carotid artery disease, hypertension and hyperlipidemia.  Cardiopulmonary stress test in 2017 showed no cardiac or ventilatory limitation.  ETT March 2018 showed hypertensive response.  Previous Myoview  in April 2018 was low risk but no ischemia or scar.  She is intolerant of Crestor .  Patient was seen on 07/08/2018 at which time she complained of increasing dyspnea on exertion going up hills.  This is similar to her previous symptoms prior to CABG.  Echocardiogram obtained on 07/15/2018 revealed EF 60 to 65%, mild AI, moderately elevated RV pressure. Repeat Myoview  was performed on 07/19/2018 which showed EF 67%, normal perfusion.  Her dyspnea on exertion was felt to be related to diastolic heart failure and she was placed on low-dose Lasix  and potassium supplement.  Losartan  was increased to 100 mg daily.  She was seen in our lipid clinic in June. Options for further lipid management were discussed. She was started on Repatha . She has been compliant with this.   She presented to [facility] ED on December 24, 2019 with unstable angina.  Subsequent troponins were normal. Transferred to [facility] in [place]. Underwent cardiac cath showing patent bypass grafts. This included a LIMA to the LAD, SVG to PDA and apparently SVG to diagonal (instead of OM as noted in op report).  She underwent PCI of the proximal LCx with a 2.75 x 12 mm Synergy stent. Echo was unremarkable except for moderate TR. She was started on Plavix .  I reviewed films from her cardiac cath  in [facility]. She has patent LIMA to the LAD, SVG to OM1, and SVG to PDA. There was a severe stenosis in the proximal LCx that supplies the second and third OMs distally. This stenosis was successfully stented. The mid to distal LCx has moderate diffuse disease.   She was seen 01/05/21 with intermittent palpitations.  Event monitor showed some NSVT, nonsustained SVT, atrial fibrillation- low burden. Placed on Eliquis .   Hospitalized 01/2021 with atrial flutter with RVR at [facility].  She was started on amiodarone  drip and converted to NSR.  She was then transitioned to p.o. amiodarone . CT chest angiography 01/31/21 normal heart size, evidence of CABG, no evidence of aortic dissection, few tree-in-bud opacities and centrilobular nodules in right upper lobe suggesting infectious/inflammatory bronchioloitis, marked celiac artery origin narrowing, ventral abdominal wall laxity with superimposed hernia containing nonobstructed small bowel loops. Her chest discomfort was though to be due to arrhythmia as troponin x3 unremarkable.   She was admitted 10/09/22 for rapid atrial flutter associated with chest discomfort, noted compliance with Amiodarone  50mg  daily. In the ED her labs were unremarkable, though BNP was elevated at 288.2. Initially she was reloaded with IV amiodarone . She then underwent a successful DCCV with return to sinus rhythm. Echo showed EF 55 to 60%, LV had normal function. Discharged on amiodarone  100mg  daily and continued Eliquis . She was seen by Dr HOLTS in June. Noted some HR in 40s. No indication for pacemaker at that time and continued monitoring. Was seen in the office in  August and doing well at that time.   Was seen by Dr Waddell in Dec. Was having symptomatic bradycardia. Underwent dual chamber pacemaker placement in January. She states this has worked well.   She had carotid dopplers in May showing some increase in velocities on the right. Plan repeat 6 months.   This past  Sunday she went into Afib with RVR. Went to ED at [facility] (records not available). Had labs, CXR and chest CT done. Thought to have early PNA and treated with Zithromax. Did convert to NSR after 2 hours. Interestingly she had remote pacer check at 4 am that day that looked good. No afib since Sunday. No chest pain. Did have a cough that has resolved.    Past Medical History:  Diagnosis Date   Anemia    in my early teens   Arthritis    neck and back   Atrial fibrillation (HCC)    post-op after CABG;  treated with Amiodarone  and coumadin until readmxn with CDiff colitis, perf colon and elevated LFTs (amio and coumadin stopped)   Atrial flutter with rapid ventricular response (HCC) 10/09/2022   C. difficile colitis 12/2010   complicated post CABG course with prolonged admission, perforated sigmoid colon;  s/p sigmoid colectomy with end colostomy Leon procedure);  laparotomy wound infection with Pseudomonas treated with antibxs and secondary intention   Chronic kidney disease    hx kidney stones   Coronary artery disease    a. NSTEMI 7/12, cath: pLAD 30%, mLAD 60-70%, dLAD 80%, oOM2 50%, mOM2 40%, trifurcation 80%, pRCA 25%, then occluded, L-R collats, EF 55%;   b. 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 Dusty) // c. ETT 3/18: inf-lat ST depression of 1 mm at 4 min of exercise, frequent ectopy and hypertensive BP response // d. 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   GERD (gastroesophageal reflux disease)    H/O hiatal hernia    slight; not having problems with it   Hair loss    after heart surgery   Hemorrhoids    none since hemorrhoid OR   History of bronchitis    late 1980's   Hyperlipidemia    takes Pravastatin  daily   Hypertension    takes Metoprolol  daily   Mitral valve prolapse    was told this in the late 1990's; ;then with Dr.Mclean he states that she doesn't   Myocardial infarction (HCC)  11/2010   Palpitations 2003   hx of; Holter in 2003 with PACs and PVCs   Pericarditis 1980   hx of   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   DR.OWEN   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 in setting of CDiff colitis   Seizures (HCC)    w/verapimil   Surgical wound infection    Pseudomonas; wound VAC; secondary intention   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   UC (ulcerative colitis) [facility])    Past Surgical History:  Procedure Laterality Date   ABDOMINAL HYSTERECTOMY     partial hysterectomy   APPENDECTOMY     as a child   BOTOX  INJECTION  12/03/2015   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   CARDIAC CATHETERIZATION  11/21/10   CARDIOVERSION N/A 10/10/2022   Procedure: CARDIOVERSION;  Surgeon: Loni Soyla LABOR, MD;  Location: [facility] INVASIVE CV LAB;  Service: Cardiovascular;  Laterality: N/A;   CATARACT EXTRACTION, BILATERAL  CHOLECYSTECTOMY N/A 01/25/2016   Procedure: LAPAROSCOPIC CHOLECYSTECTOMY WITH INTRAOPERATIVE CHOLANGIOGRAM;  Surgeon: Camellia Blush, MD;  Location: [facility] ORS;  Service: General;  Laterality: N/A;   COLONOSCOPY     COLOSTOMY RECONNECT     colostomy reversal  06/27/11   CORONARY ANGIOPLASTY WITH STENT PLACEMENT     CORONARY ARTERY BYPASS GRAFT  11-25-2010   CABG X3; LIMA to LAD, SVG to PDA, SVG to OM, EVH via right thigh   ESOPHAGOGASTRODUODENOSCOPY     FLEXIBLE SIGMOIDOSCOPY  11/09/2011   Procedure: FLEXIBLE SIGMOIDOSCOPY;  Surgeon: Toribio SHAUNNA Cedar, MD;  Location: [facility] ENDOSCOPY;  Service: Endoscopy;  Laterality: N/A;   HEMORRHOID SURGERY  ~ 2005   x 2   OOPHORECTOMY  2005   left; w/mass removal   PACEMAKER IMPLANT N/A 05/21/2023   Procedure: PACEMAKER IMPLANT;  Surgeon: Waddell Danelle ORN, MD;  Location: [facility] INVASIVE CV LAB;  Service: Cardiovascular;  Laterality: N/A;   PROCTOSCOPY  06/27/2011   Procedure: PROCTOSCOPY;  Surgeon: Camellia CHRISTELLA Blush, MD;  Location: [facility] OR;  Service: General;  Laterality: N/A;   SIGMOIDECTOMY  12/18/2010   with end colostomy   TUBAL  LIGATION       Current Meds  Medication Sig   albuterol (VENTOLIN HFA) 108 (90 Base) MCG/ACT inhaler Inhale 2 puffs into the lungs every 4 (four) hours as needed for wheezing or shortness of breath.   amiodarone  (PACERONE ) 100 MG tablet Take 1/2 (one-half) tablet by mouth once daily   apixaban  (ELIQUIS ) 5 MG TABS tablet Take 1 tablet by mouth twice daily   ascorbic acid  (VITAMIN C ) 1000 MG tablet Take 1,000 mg by mouth daily.   Bacillus Coagulans-Inulin (PROBIOTIC-PREBIOTIC) 1-250 BILLION-MG CAPS Take 1 capsule by mouth daily.   Evolocumab  (REPATHA  SURECLICK) 140 MG/ML SOAJ INJECT 140 MG SUBCUTANEOUSLY EVERY 14 DAYS   losartan  (COZAAR ) 50 MG tablet Take 1 tablet by mouth once daily   mesalamine  (APRISO ) 0.375 g 24 hr capsule Take 2 capsules by mouth once daily   Multiple Vitamin (MULTI-VITAMIN) tablet Take 1 tablet by mouth daily.     Allergies:   Apple, Nadolol, Sulfa antibiotics, Sulfamethoxazole, Sulfonamide derivatives, Verapamil, Crestor  [rosuvastatin ], and Flagyl  [metronidazole ]   Social History   Tobacco Use   Smoking status: Never    Passive exposure: Never   Smokeless tobacco: Never  Vaping Use   Vaping status: Never Used  Substance Use Topics   Alcohol use: No   Drug use: No     Family Hx: The patient's family history includes Coronary artery disease in her mother; Heart attack (age of onset: 36) in her father; Heart disease in her father and mother. There is no history of Anesthesia problems, Hypotension, Malignant hyperthermia, Pseudochol deficiency, Colon cancer, Esophageal cancer, Stomach cancer, or Rectal cancer.  ROS:   Please see the history of present illness.    All other systems reviewed and are negative.   Prior CV studies:   The following studies were reviewed today:  Echo 07/17/18: IMPRESSIONS      1. The left ventricle has normal systolic function with an ejection fraction of 60-65%. The cavity size was normal. Left ventricular diastolic Doppler  parameters are consistent with pseudonormalization Elevated left atrial and left ventricular  end-diastolic pressures.  2. The right ventricle has normal systolic function. The cavity was normal. There is no increase in right ventricular wall thickness. Right ventricular systolic pressure is moderately elevated with an estimated pressure of 43.7 mmHg.  3. The mitral valve is degenerative. Moderate  thickening of the mitral valve leaflets with moderate involvement of the chordae tendinae. Mild calcification of the mitral valve leaflet.  4. The tricuspid valve is normal in structure.  5. The aortic valve is tricuspid Aortic valve regurgitation is mild by color flow Doppler.  6. The pulmonic valve was normal in structure.  7. The inferior vena cava was dilated in size with <50% respiratory variability.  8. The interatrial septum appears to be lipomatous.   Myoview  07/19/2018 Study Highlights      The left ventricular ejection fraction is hyperdynamic (>65%). Nuclear stress EF: 67%. No T wave inversion was noted during stress. There was no ST segment deviation noted during stress. The study is normal. This is a low risk study.   Normal perfusion. LVEF 67% with normal wall motion. This is a low risk study. No change compared to prior study in 2018.   Myoview  10/07/20: Study Highlights    The left ventricular ejection fraction is normal (55-65%). Nuclear stress EF: 62%. Blood pressure demonstrated a normal response to exercise. ST segment depression was noted during stress in the II, III, aVF, V5 and V6 leads. The study is normal. This is a low risk study.   Normal stress nuclear study with no ischemia or infarction.  Gated ejection fraction 62% with normal wall motion.  Patient did complain of atypical chest pain during the study and there was note of ST depression in the inferolateral leads.   Event monitor 10/28/20: Study Highlights    Normal sinus rhythm Rare PACs with runs of  SVT/PAT. longest lasting 19 seconds Rare PVCs with infrequent NSVT 4-6 beats versus aberrancy Paroxysmal Afib with rate 73-164. mean 124 bpm. burden is low at < 1%. Minimal HR 31 at 7:29 am. Unclear if this is post termination.  Echo 10/10/22: IMPRESSIONS     1. Left ventricular ejection fraction, by estimation, is 55 to 60%. The  left ventricle has normal function. The left ventricle has no regional  wall motion abnormalities. Left ventricular diastolic parameters are  indeterminate.   2. Right ventricular systolic function is mildly reduced. The right  ventricular size is normal. There is normal pulmonary artery systolic  pressure. The estimated right ventricular systolic pressure is 34.4 mmHg.   3. Right atrial size was mildly dilated.   4. The mitral valve is grossly normal. Mild mitral valve regurgitation.  No evidence of mitral stenosis.   5. The aortic valve is tricuspid. Aortic valve regurgitation is trivial.  Aortic valve sclerosis is present, with no evidence of aortic valve  stenosis.   6. The inferior vena cava is normal in size with greater than 50%  respiratory variability, suggesting right atrial pressure of 3 mmHg.    Labs/Other Tests and Data Reviewed:    EKG:  none today    Recent Labs: 05/04/2023: Hemoglobin 13.8; Platelets 157 08/29/2023: ALT 14; BUN 20; Creatinine, Ser 0.98; Potassium 4.4; Sodium 141; TSH 2.490   Recent Lipid Panel Lab Results  Component Value Date/Time   CHOL 164 02/19/2023 08:56 AM   TRIG 74 02/19/2023 08:56 AM   HDL 85 02/19/2023 08:56 AM   CHOLHDL 1.9 02/19/2023 08:56 AM   CHOLHDL 3 04/13/2014 09:34 AM   LDLCALC 65 02/19/2023 08:56 AM   LDLDIRECT 81.8 07/20/2011 03:04 PM   Dated 07/02/18: cholesterol 161, triglycerides 81, HDL 52, LDL 93. CMET and TSH normal  Dated 12/24/19: BNP 300. Glucose 144, BUN 22, otherwise CMET normal. plts 132K. WBC 3.2K.  Dated 07/18/22: cholesterol 151,  triglycerides 141, HDL 74, LDL 49.   Wt Readings  from Last 3 Encounters:  02/20/24 136 lb (61.7 kg)  08/29/23 137 lb (62.1 kg)  08/23/23 134 lb 6.4 oz (61 kg)     Objective:    Vital Signs:  BP 138/60 (BP Location: Left Arm, Patient Position: Sitting, Cuff Size: Normal)   Pulse 66   Ht 5' 6 (1.676 m)   Wt 136 lb (61.7 kg)   SpO2 98%   BMI 21.95 kg/m    GENERAL:  Well appearing WF in NAD HEENT:  PERRL, EOMI, sclera are clear. Oropharynx is clear. NECK:  No jugular venous distention, carotid upstroke brisk and symmetric, no bruits, no thyromegaly or adenopathy LUNGS:  Clear to auscultation bilaterally CHEST:  Unremarkable HEART:  RRR,  PMI not displaced or sustained,S1 and S2 within normal limits, no S3, no S4: no clicks, no rubs, no murmurs ABD:  Soft, nontender. BS +, no masses or bruits. No hepatomegaly, no splenomegaly EXT:  2 + pulses throughout, no edema, no cyanosis no clubbing. She has some bruising at her right groin site but no hematoma. SKIN:  Warm and dry.  No rashes NEURO:  Alert and oriented x 3. Cranial nerves II through XII intact. PSYCH:  Cognitively intact    ASSESSMENT & PLAN:    1.  CAD s/p CABG x 3 in 2012 for multivessel CAD. Normal Myoview  in May 2020. Had  unstable angina in August 2021. S/p DES of the proximal LCx. Patent grafts.  No longer on antiplatelet therapy since on Eliquis .  Repeat Myoview  in May 2022 was normal. She has no significant angina now.   2. Hypercholesterolemia. intolerant of statins. Now on Repatha  with excellent response. LDL 65   3. Carotid arterial disease. 60-79% RICA stenosis.  Some increased velocities on right in May - will repeat in November.    4. HTN controlled.   5.  Paroxysmal afib/SVT/ sinus node dysfunction.   Now anticoagulated. Had AFib with RVR on Sunday - converted spontaneously. ? Triggered by pulmonary infection. Will continue  low dose amiodarone . Now s/p pacemaker implant in Jan. If Afib recurs consider increase in amiodarone  vs ablation. Will arrange follow  up with EP  6. Chronic diastolic dysfunction. Off diuretics now and euvolemic      Medication Adjustments/Labs and Tests Ordered: Current medicines are reviewed at length with the patient today.  Concerns regarding medicines are outlined above.   Tests Ordered: No orders of the defined types were placed in this encounter.    Medication Changes: No orders of the defined types were placed in this encounter.    Follow Up:  6 months   Signed, Jamear Carbonneau [place], MD  02/20/2024 11:21 AM    [facility]  Addendum    Mychal Decarlo [place] MD, FACC

## 2024-02-18 ENCOUNTER — Ambulatory Visit

## 2024-02-18 DIAGNOSIS — I480 Paroxysmal atrial fibrillation: Secondary | ICD-10-CM

## 2024-02-18 LAB — CUP PACEART REMOTE DEVICE CHECK
Battery Remaining Longevity: 136 mo
Battery Voltage: 3.11 V
Brady Statistic AP VP Percent: 93.11 %
Brady Statistic AP VS Percent: 0.44 %
Brady Statistic AS VP Percent: 4.32 %
Brady Statistic AS VS Percent: 2.12 %
Brady Statistic RA Percent Paced: 93.53 %
Brady Statistic RV Percent Paced: 97.41 %
Date Time Interrogation Session: 20251005061203
Implantable Lead Connection Status: 753985
Implantable Lead Connection Status: 753985
Implantable Lead Implant Date: 20250106
Implantable Lead Implant Date: 20250106
Implantable Lead Location: 753859
Implantable Lead Location: 753860
Implantable Lead Model: 3830
Implantable Lead Model: 5076
Implantable Pulse Generator Implant Date: 20250106
Lead Channel Impedance Value: 323 Ohm
Lead Channel Impedance Value: 418 Ohm
Lead Channel Impedance Value: 437 Ohm
Lead Channel Impedance Value: 608 Ohm
Lead Channel Pacing Threshold Amplitude: 0.75 V
Lead Channel Pacing Threshold Amplitude: 0.75 V
Lead Channel Pacing Threshold Pulse Width: 0.4 ms
Lead Channel Pacing Threshold Pulse Width: 0.4 ms
Lead Channel Sensing Intrinsic Amplitude: 10.125 mV
Lead Channel Sensing Intrinsic Amplitude: 10.125 mV
Lead Channel Sensing Intrinsic Amplitude: 2.5 mV
Lead Channel Sensing Intrinsic Amplitude: 2.5 mV
Lead Channel Setting Pacing Amplitude: 1.5 V
Lead Channel Setting Pacing Amplitude: 2 V
Lead Channel Setting Pacing Pulse Width: 0.4 ms
Lead Channel Setting Sensing Sensitivity: 1.2 mV
Zone Setting Status: 755011

## 2024-02-19 NOTE — Progress Notes (Signed)
 Remote PPM Transmission

## 2024-02-20 ENCOUNTER — Ambulatory Visit: Payer: Self-pay | Admitting: Cardiology

## 2024-02-20 ENCOUNTER — Ambulatory Visit: Attending: Cardiology | Admitting: Cardiology

## 2024-02-20 VITALS — BP 138/60 | HR 66 | Ht 66.0 in | Wt 136.0 lb

## 2024-02-20 DIAGNOSIS — I6523 Occlusion and stenosis of bilateral carotid arteries: Secondary | ICD-10-CM

## 2024-02-20 DIAGNOSIS — I480 Paroxysmal atrial fibrillation: Secondary | ICD-10-CM | POA: Diagnosis not present

## 2024-02-20 DIAGNOSIS — I10 Essential (primary) hypertension: Secondary | ICD-10-CM

## 2024-02-20 DIAGNOSIS — I2510 Atherosclerotic heart disease of native coronary artery without angina pectoris: Secondary | ICD-10-CM | POA: Diagnosis not present

## 2024-02-20 NOTE — Patient Instructions (Signed)
 Medication Instructions:  Continue all medications *If you need a refill on your cardiac medications before your next appointment, please call your pharmacy*  Lab Work: None ordered  Testing/Procedures: None ordered   Follow-Up: At [facility], you and your health needs are our priority.  As part of our continuing mission to provide you with exceptional heart care, our providers are all part of one team.  This team includes your primary Cardiologist (physician) and Advanced Practice Providers or APPs (Physician Assistants and Nurse Practitioners) who all work together to provide you with the care you need, when you need it.  Your next appointment:  6 months    Call in Jan to schedule April appointment     Provider:  Dr.Jordan        Schedule appointment with EP   We recommend signing up for the patient portal called MyChart.  Sign up information is provided on this After Visit Summary.  MyChart is used to connect with patients for Virtual Visits (Telemedicine).  Patients are able to view lab/test results, encounter notes, upcoming appointments, etc.  Non-urgent messages can be sent to your provider as well.   To learn more about what you can do with MyChart, go to [email or web address].

## 2024-02-25 NOTE — Progress Notes (Signed)
 Remote PPM Transmission

## 2024-02-26 ENCOUNTER — Encounter

## 2024-02-27 ENCOUNTER — Encounter: Admitting: Physician Assistant

## 2024-03-06 ENCOUNTER — Ambulatory Visit: Attending: Internal Medicine | Admitting: Internal Medicine

## 2024-03-06 VITALS — BP 140/68 | HR 79 | Ht 66.0 in | Wt 137.0 lb

## 2024-03-06 DIAGNOSIS — I495 Sick sinus syndrome: Secondary | ICD-10-CM

## 2024-03-06 LAB — CUP PACEART INCLINIC DEVICE CHECK
Date Time Interrogation Session: 20251023112357
Implantable Lead Connection Status: 753985
Implantable Lead Connection Status: 753985
Implantable Lead Implant Date: 20250106
Implantable Lead Implant Date: 20250106
Implantable Lead Location: 753859
Implantable Lead Location: 753860
Implantable Lead Model: 3830
Implantable Lead Model: 5076
Implantable Pulse Generator Implant Date: 20250106

## 2024-03-06 NOTE — Progress Notes (Signed)
 HPI [name] returns today for ongoing evaluation of palpitations and paroxysmal atrial arrhythmias and symptomatic bradycardia, s/p PPM insertion.  She remains active walking 3 miles most days. She was hospitalized for atrial fib and pneumonia about 3 weeks ago. She is improved. No recurrent fib/flutter since then.  Allergies  Allergen Reactions   Apple Anaphylaxis   Nadolol Hives and Anaphylaxis    Rash and nausea   Sulfa Antibiotics Hives and Anaphylaxis   Sulfamethoxazole Anaphylaxis   Sulfonamide Derivatives Anaphylaxis   Verapamil Other (See Comments) and Anaphylaxis    seizures   Crestor  [Rosuvastatin ] Other (See Comments)    Myalgias    Flagyl  [Metronidazole ] Hives     Current Outpatient Medications  Medication Sig Dispense Refill   amiodarone  (PACERONE ) 100 MG tablet Take 1/2 (one-half) tablet by mouth once daily 45 tablet 2   apixaban  (ELIQUIS ) 5 MG TABS tablet Take 1 tablet by mouth twice daily 180 tablet 2   ascorbic acid  (VITAMIN C ) 1000 MG tablet Take 1,000 mg by mouth daily.     Bacillus Coagulans-Inulin (PROBIOTIC-PREBIOTIC) 1-250 BILLION-MG CAPS Take 1 capsule by mouth daily.     Evolocumab  (REPATHA  SURECLICK) 140 MG/ML SOAJ INJECT 140 MG SUBCUTANEOUSLY EVERY 14 DAYS 2 mL 11   losartan  (COZAAR ) 50 MG tablet Take 1 tablet by mouth once daily 90 tablet 3   mesalamine  (APRISO ) 0.375 g 24 hr capsule Take 2 capsules by mouth once daily 180 capsule 0   Multiple Vitamin (MULTI-VITAMIN) tablet Take 1 tablet by mouth daily.     albuterol (VENTOLIN HFA) 108 (90 Base) MCG/ACT inhaler Inhale 2 puffs into the lungs every 4 (four) hours as needed for wheezing or shortness of breath.     No current facility-administered medications for this visit.     Past Medical History:  Diagnosis Date   Anemia    in my early teens   Arthritis    neck and back   Atrial fibrillation (HCC)    post-op after CABG;  treated with Amiodarone  and coumadin until readmxn with  CDiff colitis, perf colon and elevated LFTs (amio and coumadin stopped)   Atrial flutter with rapid ventricular response (HCC) 10/09/2022   C. difficile colitis 12/2010   complicated post CABG course with prolonged admission, perforated sigmoid colon;  s/p sigmoid colectomy with end colostomy Leon procedure);  laparotomy wound infection with Pseudomonas treated with antibxs and secondary intention   Chronic kidney disease    hx kidney stones   Coronary artery disease    a. NSTEMI 7/12, cath: pLAD 30%, mLAD 60-70%, dLAD 80%, oOM2 50%, mOM2 40%, trifurcation 80%, pRCA 25%, then occluded, L-R collats, EF 55%;   b. 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 Dusty) // c. ETT 3/18: inf-lat ST depression of 1 mm at 4 min of exercise, frequent ectopy and hypertensive BP response // d. 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   GERD (gastroesophageal reflux disease)    H/O hiatal hernia    slight; not having problems with it   Hair loss    after heart surgery   Hemorrhoids    none since hemorrhoid OR   History of bronchitis    late 1980's   Hyperlipidemia    takes Pravastatin  daily   Hypertension    takes Metoprolol  daily   Mitral valve prolapse    was told this in the late 1990's; ;  then with Dr.Mclean he states that she doesn't   Myocardial infarction [facility]) 11/2010   Palpitations 2003   hx of; Holter in 2003 with PACs and PVCs   Pericarditis 1980   hx of   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   Past Surgical History:  Procedure Laterality Date   ABDOMINAL HYSTERECTOMY     partial hysterectomy    APPENDECTOMY     as a child   BOTOX  INJECTION  12/03/2015   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   CARDIAC CATHETERIZATION  11/21/10   CARDIOVERSION N/A 10/10/2022   Procedure: CARDIOVERSION;  Surgeon: Loni Soyla LABOR, MD;  Location: [facility];  Service: Cardiovascular;  Laterality: N/A;   CATARACT EXTRACTION, BILATERAL     CHOLECYSTECTOMY N/A 01/25/2016   Procedure: LAPAROSCOPIC CHOLECYSTECTOMY WITH INTRAOPERATIVE CHOLANGIOGRAM;  Surgeon: Camellia Blush, MD;  Location: [facility];  Service: General;  Laterality: N/A;   COLONOSCOPY     COLOSTOMY RECONNECT     colostomy reversal  06/27/11   CORONARY ANGIOPLASTY WITH STENT PLACEMENT     CORONARY ARTERY BYPASS GRAFT  11-25-2010   CABG X3; LIMA to LAD, SVG to PDA, SVG to OM, EVH via right thigh   ESOPHAGOGASTRODUODENOSCOPY     FLEXIBLE SIGMOIDOSCOPY  11/09/2011   Procedure: FLEXIBLE SIGMOIDOSCOPY;  Surgeon: Toribio SHAUNNA Cedar, MD;  Location: [facility];  Service: Endoscopy;  Laterality: N/A;   HEMORRHOID SURGERY  ~ 2005   x 2   OOPHORECTOMY  2005   left; w/mass removal   PACEMAKER IMPLANT N/A 05/21/2023   Procedure: PACEMAKER IMPLANT;  Surgeon: Waddell Danelle ORN, MD;  Location: [facility];  Service: Cardiovascular;  Laterality: N/A;   PROCTOSCOPY  06/27/2011   Procedure: PROCTOSCOPY;  Surgeon: Camellia CHRISTELLA Blush, MD;  Location: [facility] OR;  Service: General;  Laterality: N/A;   SIGMOIDECTOMY  12/18/2010   with end colostomy   TUBAL LIGATION       Family History  Problem Relation Age of Onset   Heart attack Father 85   Heart disease Father        massive heart attack   Coronary artery disease Mother        [place]   Heart disease Mother    Anesthesia problems Neg Hx    Hypotension Neg Hx    Malignant hyperthermia Neg Hx    Pseudochol deficiency Neg Hx    Colon cancer Neg Hx    Esophageal cancer Neg Hx    Stomach cancer Neg Hx    Rectal cancer Neg Hx      Social History   Socioeconomic  History   Marital status: Married    Spouse name: Not on file   Number of children: 1   Years of education: Not on file   Highest education level: Not on file  Occupational History   Occupation: LPN    Employer: [employer]  Tobacco Use   Smoking status: Never    Passive exposure: Never   Smokeless tobacco: Never  Vaping Use   Vaping  status: Never Used  Substance and Sexual Activity   Alcohol use: No   Drug use: No   Sexual activity: Yes    Birth control/protection: Post-menopausal  Other Topics Concern   Not on file  Social History Narrative   Right handed   Lives with husband one story home   Social Drivers of Health   Financial Resource Strain: Not on file  Food Insecurity: No Food Insecurity (10/10/2022)   Hunger Vital Sign    Worried About Running Out of Food in the Last Year: Never true    Ran Out of Food in the Last Year: Never true  Transportation Needs: No Transportation Needs (10/10/2022)   PRAPARE - Administrator, Civil Service (Medical): No    Lack of Transportation (Non-Medical): No  Physical Activity: Not on file  Stress: Not on file  Social Connections: Unknown (09/25/2021)   [facility]   Social Network    Social Network: Not on file  Intimate Partner Violence: Not At Risk (10/10/2022)   Humiliation, Afraid, Rape, and Kick questionnaire    Fear of Current or Ex-Partner: No    Emotionally Abused: No    Physically Abused: No    Sexually Abused: No     BP (!) 140/68 (BP Location: Left Arm, Patient Position: Sitting, Cuff Size: Normal)   Pulse 79   Ht 5' 6 (1.676 m)   Wt 137 lb (62.1 kg)   SpO2 96%   BMI 22.11 kg/m   Physical Exam:  Well appearing NAD HEENT: Unremarkable Neck:  No JVD, no thyromegally Lymphatics:  No adenopathy Back:  No CVA tenderness Lungs:  Clear HEART:  Regular rate rhythm, no murmurs, no rubs, no clicks Abd:  soft, positive bowel sounds, no organomegally, no rebound, no guarding Ext:   2 plus pulses, no edema, no cyanosis, no clubbing Skin:  No rashes no nodules Neuro:  CN II through XII intact, motor grossly intact  DEVICE  Normal device function.  See PaceArt for details.   Assess/Plan:  Sinus node dysfunction - She is doing well s/p PPM insertion. Her underlying HR is 38/min today. PAF - she is maintaining NSR. She will continue low dose amiodarone .    Danelle Waddell COME

## 2024-03-06 NOTE — Patient Instructions (Signed)

## 2024-03-10 ENCOUNTER — Ambulatory Visit: Payer: Self-pay | Admitting: Cardiology

## 2024-03-15 ENCOUNTER — Other Ambulatory Visit: Payer: Self-pay | Admitting: Internal Medicine

## 2024-03-20 ENCOUNTER — Other Ambulatory Visit: Payer: Self-pay | Admitting: Cardiology

## 2024-03-24 ENCOUNTER — Ambulatory Visit (HOSPITAL_COMMUNITY)
Admission: RE | Admit: 2024-03-24 | Discharge: 2024-03-24 | Disposition: A | Discharge status: Home / Self Care | Source: Ambulatory Visit | Attending: Cardiology | Admitting: Cardiology

## 2024-03-24 DIAGNOSIS — I779 Disorder of arteries and arterioles, unspecified: Secondary | ICD-10-CM | POA: Insufficient documentation

## 2024-03-24 DIAGNOSIS — I6521 Occlusion and stenosis of right carotid artery: Secondary | ICD-10-CM | POA: Diagnosis present

## 2024-03-25 ENCOUNTER — Ambulatory Visit: Payer: Self-pay | Admitting: Cardiology

## 2024-05-12 ENCOUNTER — Ambulatory Visit: Payer: Self-pay | Admitting: Gastroenterology

## 2024-05-12 ENCOUNTER — Ambulatory Visit: Admitting: Gastroenterology

## 2024-05-12 ENCOUNTER — Other Ambulatory Visit (INDEPENDENT_AMBULATORY_CARE_PROVIDER_SITE_OTHER)

## 2024-05-12 ENCOUNTER — Encounter: Payer: Self-pay | Admitting: Gastroenterology

## 2024-05-12 VITALS — BP 124/68 | HR 64 | Ht 66.0 in | Wt 134.0 lb

## 2024-05-12 DIAGNOSIS — K5900 Constipation, unspecified: Secondary | ICD-10-CM

## 2024-05-12 DIAGNOSIS — K51918 Ulcerative colitis, unspecified with other complication: Secondary | ICD-10-CM

## 2024-05-12 LAB — COMPREHENSIVE METABOLIC PANEL WITH GFR
ALT: 13 U/L (ref 3–35)
AST: 18 U/L (ref 5–37)
Albumin: 4.3 g/dL (ref 3.5–5.2)
Alkaline Phosphatase: 54 U/L (ref 39–117)
BUN: 23 mg/dL (ref 6–23)
CO2: 30 meq/L (ref 19–32)
Calcium: 9.3 mg/dL (ref 8.4–10.5)
Chloride: 103 meq/L (ref 96–112)
Creatinine, Ser: 0.86 mg/dL (ref 0.40–1.20)
GFR: 63.14 mL/min
Glucose, Bld: 91 mg/dL (ref 70–99)
Potassium: 4.3 meq/L (ref 3.5–5.1)
Sodium: 141 meq/L (ref 135–145)
Total Bilirubin: 0.5 mg/dL (ref 0.2–1.2)
Total Protein: 6.8 g/dL (ref 6.0–8.3)

## 2024-05-12 LAB — CBC WITH DIFFERENTIAL/PLATELET
Basophils Absolute: 0 K/uL (ref 0.0–0.1)
Basophils Relative: 0.8 % (ref 0.0–3.0)
Eosinophils Absolute: 0.1 K/uL (ref 0.0–0.7)
Eosinophils Relative: 2.8 % (ref 0.0–5.0)
HCT: 38.7 % (ref 36.0–46.0)
Hemoglobin: 12.8 g/dL (ref 12.0–15.0)
Lymphocytes Relative: 26.3 % (ref 12.0–46.0)
Lymphs Abs: 1 K/uL (ref 0.7–4.0)
MCHC: 33.1 g/dL (ref 30.0–36.0)
MCV: 88.1 fl (ref 78.0–100.0)
Monocytes Absolute: 0.4 K/uL (ref 0.1–1.0)
Monocytes Relative: 10.5 % (ref 3.0–12.0)
Neutro Abs: 2.2 K/uL (ref 1.4–7.7)
Neutrophils Relative %: 59.6 % (ref 43.0–77.0)
Platelets: 140 K/uL — ABNORMAL LOW (ref 150.0–400.0)
RBC: 4.39 Mil/uL (ref 3.87–5.11)
RDW: 14 % (ref 11.5–15.5)
WBC: 3.8 K/uL — ABNORMAL LOW (ref 4.0–10.5)

## 2024-05-12 LAB — C-REACTIVE PROTEIN: CRP: <0.5 mg/dL — ABNORMAL LOW (ref 1.0–20.0)

## 2024-05-12 LAB — SEDIMENTATION RATE: Sed Rate: 26 mm/h (ref 0–30)

## 2024-05-12 NOTE — Patient Instructions (Addendum)
 Your provider has requested that you go to the basement level for lab work before leaving today. Press B on the elevator. The lab is located at the first door on the left as you exit the elevator.  Due to recent changes in healthcare laws, you may see the results of your imaging and laboratory studies on MyChart before your provider has had a chance to review them.  We understand that in some cases there may be results that are confusing or concerning to you. Not all laboratory results come back in the same time frame and the provider may be waiting for multiple results in order to interpret others.  Please give us  48 hours in order for your provider to thoroughly review all the results before contacting the office for clarification of your results.   Continue your mesalamine  as prescribed.   It is okay to use Miralax  or stool softener as needed for constipation.   Call us  back at [phone] mid January to set up a March appointment with Dr Avram.     I appreciate the opportunity to care for you. Camie Furbish, PA

## 2024-05-12 NOTE — Progress Notes (Signed)
 "  [name] [id number] [birth year]   Chief Complaint: Colitis  Referring Provider: Trinidad Hun, MD Primary GI MD: Dr. Avram  HPI: [name] is a 83 y.o. female with past medical history of anemia, A-fib, C. difficile colitis with perforated sigmoid colon s/p colostomy followed by reversal, ulcerative colitis, CKD, CAD s/p CABG x 3, pacemaker in place, GERD, hiatal hernia, hemorrhoids, HLD, HTN, prior MI, MVP, appendectomy, hysterectomy who presents today for medication refill.    Last seen in office 11/17/2022 by Dr. Avram.  Previous patient of Dr. Teressa with history of chronic colitis (suspected UC), perforated colon in the setting of C. difficile with an anastomotic stricture requiring dilatation, CAD status post CABG 2012 and atrial fibrillation on amiodarone  and Eliquis .  She had called the office complaining of diarrhea.  Had been weaned from Apriso  the prior year and did well but had some recurrent diarrhea over the previous few months.  Had been seen in the ED and restarted on 0.375 mg mesalamine  daily.  She was found to be having a flare of her colitis, had not had any stools in about 36 hours.  Abdominal x-ray, stool culture, C. difficile, fecal calprotectin were ordered. Noted also to have had elevated hemoglobin in the ED, possible need for hematologic evaluation.  Abdominal x-ray was unremarkable. Does not appear that she returned her stool studies. She started feeling lightheaded on mesalamine , was not having any more diarrhea.  Advised to try 2 caps daily which had helped in the past without side effects. She was advised to have a repeat CBC.  Looks like this was normal December 2024.  Current dose of mesalamine  is 0.75 g daily.  Last visit with cardiology 03/06/2024.  Per note was hospitalized with A-fib and pneumonia about 3 weeks prior.  Was there for follow-up of ongoing evaluation palpitations and paroxysmal atrial arrhythmias and symptomatic bradycardia  s/p PPM insertion.  Was doing well at that time, advised to follow-up in a year.   Discussed the use of AI scribe software for clinical note transcription with the patient, who gave verbal consent to proceed.  History of Present Illness [name] is an 83 year old female with ulcerative colitis who presents for follow-up of colitis symptoms.  Abdominal pain and colitis flare-ups - Intermittent flare-ups occur approximately twice per month, most recently two weeks ago - Flares characterized by cramping and spasms in the lower abdomen, followed by urgent loose stools - Up to three bowel movements in a day during flares, typically lasting one day - Cramping resolves after bowel movements - Flare-ups sometimes correlate with intake of foods such as pizza or spaghetti sauce - No blood or mucus in stool during flares - No fever, chills, or unintentional weight loss - Appetite remains good  Bowel habits and constipation - Occasional difficulty initiating bowel movements and need to strain - Intermittent constipation - Maintains adequate hydration - No hard stools, pain with bowel movements, or blood in stool  Ulcerative colitis management - Colitis managed with mesalamine  (Apriso ) 2 capsules daily - Dose previously increased to 4 capsules daily but reduced due to lightheadedness - Obtains a 90-day supply and refills as needed  Recent gastrointestinal workup - Abdominal x-ray in July of previous year was normal - Stool studies were ordered but not completed as symptoms resolved - Last colonoscopy in 2013, followed by flexible sigmoidoscopy in 2014  Musculoskeletal symptoms - Joint pain in thumbs - No new rashes or other joint pains  Cardiopulmonary symptoms - No chest pain or shortness of breath    Previous GI Procedures/Imaging   Flexible sigmoidoscopy 12/10/2012 The examined colon to the splenic flexure was mildly erythematous throughout but not frankly inflamed, this  was biopsied and sent to pathology.  The rectosigmoid anastomosis very easy to traverse, lumen probably 1.7 cm across and this was dilated up to 2 cm.  Colonoscopy 12/26/2011 Sigmoid anastomosis stricture.  Able to be passed without dilation, allowing for complete examination of the colon.  Stricture was dilated again using 20 mm balloon, there was no resulting bleeding.  Mucosa just proximal to the anastomotic line was edematous, thickened.  Further dilation not thought to be helpful at that point.  Otherwise normal exam.  Advised to have repeat colonoscopy in 10 years.  Past Medical History:  Diagnosis Date   Anemia    in my early teens   Arthritis    neck and back   Atrial fibrillation (HCC)    post-op after CABG;  treated with Amiodarone  and coumadin until readmxn with CDiff colitis, perf colon and elevated LFTs (amio and coumadin stopped)   Atrial flutter with rapid ventricular response (HCC) 10/09/2022   C. difficile colitis 12/2010   complicated post CABG course with prolonged admission, perforated sigmoid colon;  s/p sigmoid colectomy with end colostomy Leon procedure);  laparotomy wound infection with Pseudomonas treated with antibxs and secondary intention   Chronic kidney disease    hx kidney stones   Coronary artery disease    a. NSTEMI 7/12, cath: pLAD 30%, mLAD 60-70%, dLAD 80%, oOM2 50%, mOM2 40%, trifurcation 80%, pRCA 25%, then occluded, L-R collats, EF 55%;   b. 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 Dusty) // c. ETT 3/18: inf-lat ST depression of 1 mm at 4 min of exercise, frequent ectopy and hypertensive BP response // d. 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   GERD (gastroesophageal reflux disease)    H/O hiatal hernia    slight; not having problems with it   Hair loss    after heart surgery   Hemorrhoids    none since hemorrhoid OR   History of bronchitis    late 1980's    Hyperlipidemia    takes Pravastatin  daily   Hypertension    takes Metoprolol  daily   Mitral valve prolapse    was told this in the late 1990's; ;then with Dr.Mclean he states that she doesn't   Myocardial infarction (HCC) 11/2010   Palpitations 2003   hx of; Holter in 2003 with PACs and PVCs   Pericarditis 1980   hx of   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   DR.OWEN   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 in setting of CDiff colitis   Seizures (HCC)    w/verapimil   Surgical wound infection    Pseudomonas; wound VAC; secondary intention   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   UC (ulcerative colitis) [facility])     Past Surgical History:  Procedure Laterality Date   ABDOMINAL HYSTERECTOMY     partial hysterectomy   APPENDECTOMY     as a child   BOTOX  INJECTION  12/03/2015   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   CARDIAC CATHETERIZATION  11/21/10   CARDIOVERSION N/A 10/10/2022   Procedure: CARDIOVERSION;  Surgeon: Loni Soyla LABOR, MD;  Location: [facility];  Service: Cardiovascular;  Laterality: N/A;   CATARACT EXTRACTION, BILATERAL     CHOLECYSTECTOMY N/A 01/25/2016   Procedure: LAPAROSCOPIC CHOLECYSTECTOMY WITH INTRAOPERATIVE CHOLANGIOGRAM;  Surgeon: Camellia Blush, MD;  Location: [facility];  Service: General;  Laterality: N/A;   COLONOSCOPY     COLOSTOMY RECONNECT     colostomy reversal  06/27/11   CORONARY ANGIOPLASTY WITH STENT PLACEMENT     CORONARY ARTERY BYPASS GRAFT  11-25-2010   CABG X3; LIMA to LAD, SVG to PDA, SVG to OM, EVH via right thigh   ESOPHAGOGASTRODUODENOSCOPY     FLEXIBLE SIGMOIDOSCOPY  11/09/2011   Procedure: FLEXIBLE SIGMOIDOSCOPY;  Surgeon: Toribio SHAUNNA Cedar, MD;  Location: [facility];  Service: Endoscopy;  Laterality: N/A;   HEMORRHOID SURGERY  ~ 2005   x 2   OOPHORECTOMY  2005   left; w/mass removal   PACEMAKER IMPLANT N/A 05/21/2023   Procedure: PACEMAKER IMPLANT;  Surgeon: Waddell Danelle ORN, MD;  Location: [facility];   Service: Cardiovascular;  Laterality: N/A;   PROCTOSCOPY  06/27/2011   Procedure: PROCTOSCOPY;  Surgeon: Camellia CHRISTELLA Blush, MD;  Location: [facility] OR;  Service: General;  Laterality: N/A;   SIGMOIDECTOMY  12/18/2010   with end colostomy   TUBAL LIGATION      Current Outpatient Medications  Medication Sig Dispense Refill   amiodarone  (PACERONE ) 100 MG tablet Take 1/2 (one-half) tablet by mouth once daily 45 tablet 2   apixaban  (ELIQUIS ) 5 MG TABS tablet Take 1 tablet by mouth twice daily 180 tablet 2   ascorbic acid  (VITAMIN C ) 1000 MG tablet Take 1,000 mg by mouth daily.     Bacillus Coagulans-Inulin (PROBIOTIC-PREBIOTIC) 1-250 BILLION-MG CAPS Take 1 capsule by mouth daily.     Evolocumab  (REPATHA  SURECLICK) 140 MG/ML SOAJ INJECT 140 MG SUBCUTANEOUSLY EVERY 14 DAYS 2 mL 11   losartan  (COZAAR ) 50 MG tablet Take 1 tablet by mouth once daily 90 tablet 3   mesalamine  (APRISO ) 0.375 g 24 hr capsule Take 2 capsules by mouth once daily 180 capsule 0   Multiple Vitamin (MULTI-VITAMIN) tablet Take 1 tablet by mouth daily.     albuterol (VENTOLIN HFA) 108 (90 Base) MCG/ACT inhaler Inhale 2 puffs into the lungs every 4 (four) hours as needed for wheezing or shortness of breath.     No current facility-administered medications for this visit.    Allergies as of 05/12/2024 - Review Complete 05/12/2024  Allergen Reaction Noted   Apple Anaphylaxis 09/19/2021   Nadolol Hives and Anaphylaxis 08/02/2009   Sulfa antibiotics Hives and Anaphylaxis 11/20/2011   Sulfamethoxazole Anaphylaxis 05/03/2015   Sulfonamide derivatives Anaphylaxis    Verapamil Other (See Comments) and Anaphylaxis    Crestor  [rosuvastatin ] Other (See Comments) 06/19/2019   Flagyl  [metronidazole ] Hives 04/30/2018    Family History  Problem Relation Age of Onset   Heart attack Father 18   Heart disease Father        massive heart attack   Coronary artery disease Mother        [place]   Heart disease Mother    Anesthesia  problems Neg Hx    Hypotension Neg Hx    Malignant hyperthermia Neg Hx    Pseudochol deficiency Neg Hx    Colon cancer Neg Hx    Esophageal cancer Neg Hx    Stomach cancer Neg Hx    Rectal cancer Neg Hx     Social History[1]   Review of Systems:    Constitutional: No weight loss, fever, chills Cardiovascular:  No chest pain Respiratory: No SOB Gastrointestinal: See HPI and otherwise negative   Physical Exam:  Vital signs: BP 124/68   Pulse 64   Ht 5' 6 (1.676 m)   Wt 134 lb (60.8 kg)   BMI 21.63 kg/m   Wt Readings from Last 3 Encounters:  05/12/24 134 lb (60.8 kg)  03/06/24 137 lb (62.1 kg)  02/20/24 136 lb (61.7 kg)     Constitutional: Pleasant, well-appearing female in NAD, alert and cooperative Head:  Normocephalic and atraumatic.  Respiratory: Respirations even and unlabored. Lungs clear to auscultation bilaterally.  No wheezes, crackles, or rhonchi.  Cardiovascular:  Regular rate and rhythm. No murmurs. No peripheral edema. Gastrointestinal:  Soft, nondistended, nontender. No rebound or guarding. Normal bowel sounds. No appreciable masses or hepatomegaly. Rectal:  Not performed.  Neurologic:  Alert and oriented x4;  grossly normal neurologically.  Skin:   Dry and intact without significant lesions or rashes. Psychiatric: Oriented to person, place and time. Demonstrates good judgement and reason without abnormal affect or behaviors.  Echocardiogram 10/10/2022 1. Left ventricular ejection fraction, by estimation, is 55 to 60% . The left ventricle has normal function. The left ventricle has no regional wall motion abnormalities. Left ventricular diastolic parameters are indeterminate.  2. Right ventricular systolic function is mildly reduced. The right ventricular size is normal. There is normal pulmonary artery systolic pressure. The estimated right ventricular systolic pressure is 34. 4 mmHg.  3. Right atrial size was mildly dilated.  4. The mitral valve is grossly  normal. Mild mitral valve regurgitation. No evidence of mitral stenosis.  5. The aortic valve is tricuspid. Aortic valve regurgitation is trivial. Aortic valve sclerosis is present, with no evidence of aortic valve stenosis.  6. The inferior vena cava is normal in size with greater than 50% respiratory variability, suggesting right atrial pressure of 3 mmHg.  Assessment/Plan:   Assessment & Plan Ulcerative colitis Ulcerative colitis well-controlled on mesalamine  0.75 mg daily with mild intermittent flare-ups once or twice a month lasting no more than a day.  They have up to 3 loose bowel movements that day with associated abdominal cramping that resolves after bowel movement.  No blood in stool or mucus in stool. Last colonoscopy 2013, flexible sigmoidoscopy 2014.  May need to consider repeat colonoscopy.  Will have her follow-up with Dr. Avram to discuss this.  - Continue mesalamine  0.75 mg daily - Labs today: CBC, CMP, ESR, CRP - Advised to let us  know if diarrhea is persistent, at which time would recommend she complete stool studies - Follow up with Dr. Avram to discuss possibility of repeat colonoscopy  Constipation Intermittent mild constipation managed with hydration and occasional over-the-counter agents.  - Advised over-the-counter stool softener or Miralax  as needed for mild constipation - Encouraged adequate hydration.   Camie Furbish, PA-C [facility] 05/12/2024, 10:32 AM  Patient Care Team: Trinidad Hun, MD as PCP - General (Family Medicine) Jordan, Peter M, MD as PCP - Cardiology (Cardiology) Cindie Ole DASEN, MD as PCP - Electrophysiology (Cardiology)       [1]  Social History Tobacco Use   Smoking status: Never    Passive exposure: Never   Smokeless tobacco: Never  Vaping Use   Vaping status: Never Used  Substance Use Topics   Alcohol use: No   Drug use: No   "

## 2024-05-19 ENCOUNTER — Ambulatory Visit

## 2024-05-19 DIAGNOSIS — I480 Paroxysmal atrial fibrillation: Secondary | ICD-10-CM | POA: Diagnosis not present

## 2024-05-20 ENCOUNTER — Telehealth: Payer: Self-pay | Admitting: Gastroenterology

## 2024-05-20 NOTE — Telephone Encounter (Signed)
 Inbound call from patient stating that she was last seen on 05/12/2024 with Camie Furbish. Patient had labs done and was advised that we could send those lab results over to her PCP. Patient spoke to PCP to see if they had received them and was advised that they did not received any lab results from our office. Patient is wondering if we could resend them. Please advise.

## 2024-05-20 NOTE — Telephone Encounter (Signed)
 Labs have been sent to PCP as advised pt aware

## 2024-05-21 ENCOUNTER — Ambulatory Visit: Payer: Self-pay | Admitting: Student in an Organized Health Care Education/Training Program

## 2024-05-21 LAB — CUP PACEART REMOTE DEVICE CHECK
Battery Remaining Longevity: 131 mo
Battery Voltage: 3.05 V
Brady Statistic AP VP Percent: 76.28 %
Brady Statistic AP VS Percent: 1.29 %
Brady Statistic AS VP Percent: 12.04 %
Brady Statistic AS VS Percent: 10.39 %
Brady Statistic RA Percent Paced: 77.41 %
Brady Statistic RV Percent Paced: 88.29 %
Date Time Interrogation Session: 20260104234138
Implantable Lead Connection Status: 753985
Implantable Lead Connection Status: 753985
Implantable Lead Implant Date: 20250106
Implantable Lead Implant Date: 20250106
Implantable Lead Location: 753859
Implantable Lead Location: 753860
Implantable Lead Model: 3830
Implantable Lead Model: 5076
Implantable Pulse Generator Implant Date: 20250106
Lead Channel Impedance Value: 323 Ohm
Lead Channel Impedance Value: 418 Ohm
Lead Channel Impedance Value: 437 Ohm
Lead Channel Impedance Value: 551 Ohm
Lead Channel Pacing Threshold Amplitude: 0.75 V
Lead Channel Pacing Threshold Amplitude: 0.875 V
Lead Channel Pacing Threshold Pulse Width: 0.4 ms
Lead Channel Pacing Threshold Pulse Width: 0.4 ms
Lead Channel Sensing Intrinsic Amplitude: 1.625 mV
Lead Channel Sensing Intrinsic Amplitude: 1.625 mV
Lead Channel Sensing Intrinsic Amplitude: 6.375 mV
Lead Channel Sensing Intrinsic Amplitude: 6.375 mV
Lead Channel Setting Pacing Amplitude: 1.5 V
Lead Channel Setting Pacing Amplitude: 2 V
Lead Channel Setting Pacing Pulse Width: 0.4 ms
Lead Channel Setting Sensing Sensitivity: 1.2 mV
Zone Setting Status: 755011

## 2024-05-22 NOTE — Progress Notes (Signed)
 Remote PPM Transmission

## 2024-06-04 ENCOUNTER — Telehealth: Payer: Self-pay | Admitting: Cardiology

## 2024-06-04 ENCOUNTER — Telehealth: Payer: Self-pay | Admitting: Pharmacy Technician

## 2024-06-04 ENCOUNTER — Other Ambulatory Visit (HOSPITAL_COMMUNITY): Payer: Self-pay

## 2024-06-04 NOTE — Telephone Encounter (Signed)
 Pharmacy Patient Advocate Encounter   Received notification from Pt Calls Messages that prior authorization for repatha  is required/requested.   Insurance verification completed.   The patient is insured through [employer].   Per test claim: PA required; PA submitted to above mentioned insurance via Latent Key/confirmation #/EOC [id number] Status is pending   Pharmacy Patient Advocate Encounter  Received notification from [employer] that Prior Authorization for repatha  has been APPROVED from 05/15/24 to 05/14/25   PA #/Case ID/Reference #: [id number]

## 2024-06-04 NOTE — Telephone Encounter (Signed)
 Pt dropped off a medication denial paperwork for Repatha . Stated she has new insurance.   Location: Dr. Gib mailbox.

## 2024-06-17 ENCOUNTER — Other Ambulatory Visit: Payer: Self-pay | Admitting: Internal Medicine

## 2024-07-29 ENCOUNTER — Ambulatory Visit: Admitting: Internal Medicine

## 2024-08-18 ENCOUNTER — Encounter

## 2024-08-21 ENCOUNTER — Ambulatory Visit: Admitting: Cardiology

## 2024-11-17 ENCOUNTER — Encounter

## 2025-02-16 ENCOUNTER — Encounter

## 2025-05-18 ENCOUNTER — Encounter

## 2025-08-17 ENCOUNTER — Encounter
